# Patient Record
Sex: Female | Born: 1935 | Race: White | Hispanic: No | Smoking: Former smoker
Health system: Southern US, Community
[De-identification: ages and names within clinical notes are randomized; demographics above are authoritative.]

## PROBLEM LIST (undated history)

## (undated) DIAGNOSIS — I509 Heart failure, unspecified: Secondary | ICD-10-CM

## (undated) DIAGNOSIS — F329 Major depressive disorder, single episode, unspecified: Secondary | ICD-10-CM

## (undated) DIAGNOSIS — I10 Essential (primary) hypertension: Secondary | ICD-10-CM

## (undated) DIAGNOSIS — Z9981 Dependence on supplemental oxygen: Secondary | ICD-10-CM

## (undated) DIAGNOSIS — I219 Acute myocardial infarction, unspecified: Secondary | ICD-10-CM

## (undated) DIAGNOSIS — I639 Cerebral infarction, unspecified: Secondary | ICD-10-CM

## (undated) DIAGNOSIS — F32A Depression, unspecified: Secondary | ICD-10-CM

## (undated) DIAGNOSIS — J449 Chronic obstructive pulmonary disease, unspecified: Secondary | ICD-10-CM

## (undated) DIAGNOSIS — J189 Pneumonia, unspecified organism: Secondary | ICD-10-CM

## (undated) DIAGNOSIS — I714 Abdominal aortic aneurysm, without rupture, unspecified: Secondary | ICD-10-CM

## (undated) DIAGNOSIS — G629 Polyneuropathy, unspecified: Secondary | ICD-10-CM

## (undated) DIAGNOSIS — M7989 Other specified soft tissue disorders: Secondary | ICD-10-CM

## (undated) DIAGNOSIS — F419 Anxiety disorder, unspecified: Secondary | ICD-10-CM

## (undated) DIAGNOSIS — M1990 Unspecified osteoarthritis, unspecified site: Secondary | ICD-10-CM

## (undated) HISTORY — DX: Acute myocardial infarction, unspecified: I21.9

## (undated) HISTORY — PX: APPENDECTOMY: SHX54

## (undated) HISTORY — DX: Dependence on supplemental oxygen: Z99.81

## (undated) HISTORY — DX: Abdominal aortic aneurysm, without rupture: I71.4

## (undated) HISTORY — DX: Polyneuropathy, unspecified: G62.9

## (undated) HISTORY — DX: Other specified soft tissue disorders: M79.89

## (undated) HISTORY — PX: CHOLECYSTECTOMY: SHX55

## (undated) HISTORY — DX: Abdominal aortic aneurysm, without rupture, unspecified: I71.40

---

## 1998-10-12 ENCOUNTER — Emergency Department (HOSPITAL_COMMUNITY): Admission: EM | Admit: 1998-10-12 | Discharge: 1998-10-12 | Payer: Self-pay | Admitting: *Deleted

## 2006-09-17 ENCOUNTER — Emergency Department (HOSPITAL_COMMUNITY): Admission: EM | Admit: 2006-09-17 | Discharge: 2006-09-17 | Payer: Self-pay | Admitting: Emergency Medicine

## 2006-10-30 ENCOUNTER — Ambulatory Visit: Payer: Self-pay | Admitting: Internal Medicine

## 2006-10-30 LAB — CONVERTED CEMR LAB: TSH: 1.259 microintl units/mL

## 2006-11-28 ENCOUNTER — Encounter: Payer: Self-pay | Admitting: Internal Medicine

## 2006-11-28 DIAGNOSIS — M545 Low back pain, unspecified: Secondary | ICD-10-CM | POA: Insufficient documentation

## 2006-11-28 DIAGNOSIS — N390 Urinary tract infection, site not specified: Secondary | ICD-10-CM | POA: Insufficient documentation

## 2006-11-28 DIAGNOSIS — R32 Unspecified urinary incontinence: Secondary | ICD-10-CM | POA: Insufficient documentation

## 2006-11-28 DIAGNOSIS — I252 Old myocardial infarction: Secondary | ICD-10-CM

## 2006-11-28 DIAGNOSIS — H269 Unspecified cataract: Secondary | ICD-10-CM | POA: Insufficient documentation

## 2006-11-28 DIAGNOSIS — J4489 Other specified chronic obstructive pulmonary disease: Secondary | ICD-10-CM | POA: Insufficient documentation

## 2006-11-28 DIAGNOSIS — F329 Major depressive disorder, single episode, unspecified: Secondary | ICD-10-CM

## 2006-11-28 DIAGNOSIS — I10 Essential (primary) hypertension: Secondary | ICD-10-CM

## 2006-11-28 DIAGNOSIS — N318 Other neuromuscular dysfunction of bladder: Secondary | ICD-10-CM | POA: Insufficient documentation

## 2006-11-28 DIAGNOSIS — J449 Chronic obstructive pulmonary disease, unspecified: Secondary | ICD-10-CM

## 2006-11-28 DIAGNOSIS — F419 Anxiety disorder, unspecified: Secondary | ICD-10-CM

## 2007-01-23 ENCOUNTER — Ambulatory Visit: Payer: Self-pay | Admitting: Internal Medicine

## 2007-01-24 ENCOUNTER — Ambulatory Visit (HOSPITAL_COMMUNITY): Admission: RE | Admit: 2007-01-24 | Discharge: 2007-01-24 | Payer: Self-pay | Admitting: Internal Medicine

## 2007-01-30 ENCOUNTER — Ambulatory Visit (HOSPITAL_COMMUNITY): Admission: RE | Admit: 2007-01-30 | Discharge: 2007-01-30 | Payer: Self-pay | Admitting: Internal Medicine

## 2007-02-03 ENCOUNTER — Ambulatory Visit: Payer: Self-pay | Admitting: Internal Medicine

## 2007-02-22 ENCOUNTER — Emergency Department (HOSPITAL_COMMUNITY): Admission: EM | Admit: 2007-02-22 | Discharge: 2007-02-23 | Payer: Self-pay | Admitting: Emergency Medicine

## 2007-04-25 ENCOUNTER — Ambulatory Visit: Payer: Self-pay | Admitting: Family Medicine

## 2007-05-20 ENCOUNTER — Ambulatory Visit: Payer: Self-pay | Admitting: Family Medicine

## 2007-09-24 ENCOUNTER — Encounter: Payer: Self-pay | Admitting: *Deleted

## 2007-09-24 ENCOUNTER — Ambulatory Visit: Payer: Self-pay | Admitting: Orthopedic Surgery

## 2007-09-24 DIAGNOSIS — M5430 Sciatica, unspecified side: Secondary | ICD-10-CM

## 2007-09-24 DIAGNOSIS — M549 Dorsalgia, unspecified: Secondary | ICD-10-CM | POA: Insufficient documentation

## 2007-09-29 ENCOUNTER — Telehealth (INDEPENDENT_AMBULATORY_CARE_PROVIDER_SITE_OTHER): Payer: Self-pay | Admitting: *Deleted

## 2007-09-29 ENCOUNTER — Ambulatory Visit: Payer: Self-pay | Admitting: Internal Medicine

## 2007-09-29 DIAGNOSIS — I714 Abdominal aortic aneurysm, without rupture: Secondary | ICD-10-CM

## 2007-09-29 DIAGNOSIS — F17200 Nicotine dependence, unspecified, uncomplicated: Secondary | ICD-10-CM

## 2007-09-29 LAB — CONVERTED CEMR LAB
CO2: 28 meq/L (ref 19–32)
Calcium: 9.9 mg/dL (ref 8.4–10.5)
Chloride: 103 meq/L (ref 96–112)
Glucose, Bld: 98 mg/dL (ref 70–99)
Potassium: 4.4 meq/L (ref 3.5–5.3)
Sodium: 142 meq/L (ref 135–145)

## 2007-09-30 ENCOUNTER — Telehealth (INDEPENDENT_AMBULATORY_CARE_PROVIDER_SITE_OTHER): Payer: Self-pay | Admitting: *Deleted

## 2007-10-01 ENCOUNTER — Telehealth (INDEPENDENT_AMBULATORY_CARE_PROVIDER_SITE_OTHER): Payer: Self-pay | Admitting: *Deleted

## 2007-10-02 ENCOUNTER — Ambulatory Visit (HOSPITAL_COMMUNITY): Admission: RE | Admit: 2007-10-02 | Discharge: 2007-10-02 | Payer: Self-pay | Admitting: Internal Medicine

## 2007-10-06 ENCOUNTER — Telehealth (INDEPENDENT_AMBULATORY_CARE_PROVIDER_SITE_OTHER): Payer: Self-pay | Admitting: *Deleted

## 2007-10-09 ENCOUNTER — Ambulatory Visit: Payer: Self-pay | Admitting: Orthopedic Surgery

## 2007-10-09 DIAGNOSIS — M25569 Pain in unspecified knee: Secondary | ICD-10-CM

## 2007-10-14 ENCOUNTER — Ambulatory Visit: Payer: Self-pay | Admitting: Vascular Surgery

## 2007-11-07 ENCOUNTER — Ambulatory Visit: Payer: Self-pay | Admitting: Internal Medicine

## 2007-11-07 DIAGNOSIS — J441 Chronic obstructive pulmonary disease with (acute) exacerbation: Secondary | ICD-10-CM | POA: Insufficient documentation

## 2007-11-07 DIAGNOSIS — R635 Abnormal weight gain: Secondary | ICD-10-CM | POA: Insufficient documentation

## 2007-11-26 ENCOUNTER — Emergency Department (HOSPITAL_COMMUNITY): Admission: EM | Admit: 2007-11-26 | Discharge: 2007-11-26 | Payer: Self-pay | Admitting: Emergency Medicine

## 2007-12-05 ENCOUNTER — Ambulatory Visit: Payer: Self-pay | Admitting: Internal Medicine

## 2007-12-05 DIAGNOSIS — J309 Allergic rhinitis, unspecified: Secondary | ICD-10-CM | POA: Insufficient documentation

## 2007-12-19 ENCOUNTER — Telehealth (INDEPENDENT_AMBULATORY_CARE_PROVIDER_SITE_OTHER): Payer: Self-pay | Admitting: Internal Medicine

## 2008-01-05 ENCOUNTER — Ambulatory Visit: Payer: Self-pay | Admitting: Internal Medicine

## 2008-01-06 ENCOUNTER — Telehealth (INDEPENDENT_AMBULATORY_CARE_PROVIDER_SITE_OTHER): Payer: Self-pay | Admitting: *Deleted

## 2008-01-06 LAB — CONVERTED CEMR LAB
CO2: 25 meq/L (ref 19–32)
Calcium: 11 mg/dL — ABNORMAL HIGH (ref 8.4–10.5)
Chloride: 96 meq/L (ref 96–112)
Creatinine, Ser: 0.87 mg/dL (ref 0.40–1.20)
Glucose, Bld: 93 mg/dL (ref 70–99)
HDL: 85 mg/dL (ref >39)
LDL Cholesterol: 125 mg/dL — ABNORMAL HIGH (ref 0–99)
TSH: 1.029 microintl units/mL (ref 0.350–5.50)

## 2008-03-09 ENCOUNTER — Encounter: Admission: RE | Admit: 2008-03-09 | Discharge: 2008-03-09 | Payer: Self-pay | Admitting: Vascular Surgery

## 2008-03-09 ENCOUNTER — Ambulatory Visit: Payer: Self-pay | Admitting: Vascular Surgery

## 2008-03-24 ENCOUNTER — Telehealth: Payer: Self-pay | Admitting: Orthopedic Surgery

## 2008-06-14 ENCOUNTER — Encounter (INDEPENDENT_AMBULATORY_CARE_PROVIDER_SITE_OTHER): Payer: Self-pay | Admitting: Internal Medicine

## 2008-06-16 ENCOUNTER — Ambulatory Visit: Payer: Self-pay | Admitting: Internal Medicine

## 2008-06-17 LAB — CONVERTED CEMR LAB
Calcium: 9.8 mg/dL (ref 8.4–10.5)
Chloride: 100 meq/L (ref 96–112)
Creatinine, Ser: 0.79 mg/dL (ref 0.40–1.20)
Sodium: 134 meq/L — ABNORMAL LOW (ref 135–145)

## 2008-07-22 ENCOUNTER — Encounter (INDEPENDENT_AMBULATORY_CARE_PROVIDER_SITE_OTHER): Payer: Self-pay | Admitting: Internal Medicine

## 2008-09-14 ENCOUNTER — Ambulatory Visit: Payer: Self-pay | Admitting: Vascular Surgery

## 2008-09-14 ENCOUNTER — Encounter: Admission: RE | Admit: 2008-09-14 | Discharge: 2008-09-14 | Payer: Self-pay | Admitting: Vascular Surgery

## 2009-02-02 ENCOUNTER — Encounter (INDEPENDENT_AMBULATORY_CARE_PROVIDER_SITE_OTHER): Payer: Self-pay | Admitting: Internal Medicine

## 2009-05-03 ENCOUNTER — Ambulatory Visit: Payer: Self-pay | Admitting: Vascular Surgery

## 2009-05-03 ENCOUNTER — Encounter: Admission: RE | Admit: 2009-05-03 | Discharge: 2009-05-03 | Payer: Self-pay | Admitting: Vascular Surgery

## 2010-01-12 ENCOUNTER — Encounter: Admission: RE | Admit: 2010-01-12 | Discharge: 2010-01-12 | Payer: Self-pay | Admitting: Vascular Surgery

## 2010-01-12 ENCOUNTER — Ambulatory Visit: Payer: Self-pay | Admitting: Vascular Surgery

## 2011-01-08 ENCOUNTER — Inpatient Hospital Stay (HOSPITAL_COMMUNITY): Admission: EM | Admit: 2011-01-08 | Discharge: 2011-01-11 | Payer: Self-pay | Source: Home / Self Care

## 2011-01-15 LAB — CBC
HCT: 39.5 % (ref 36.0–46.0)
HCT: 40.5 % (ref 36.0–46.0)
Hemoglobin: 14.2 g/dL (ref 12.0–15.0)
Hemoglobin: 14.1 g/dL (ref 12.0–15.0)
MCH: 33.5 pg (ref 26.0–34.0)
MCH: 32.9 pg (ref 26.0–34.0)
MCHC: 35.9 g/dL (ref 30.0–36.0)
MCHC: 34.8 g/dL (ref 30.0–36.0)
MCV: 93.2 fL (ref 78.0–100.0)
MCV: 94.4 fL (ref 78.0–100.0)
Platelets: 199 10*3/uL (ref 150–400)
Platelets: 216 10*3/uL (ref 150–400)
RBC: 4.24 MIL/uL (ref 3.87–5.11)
RBC: 4.29 MIL/uL (ref 3.87–5.11)
RDW: 13.3 % (ref 11.5–15.5)
RDW: 13.4 % (ref 11.5–15.5)
WBC: 7.9 10*3/uL (ref 4.0–10.5)
WBC: 6.1 10*3/uL (ref 4.0–10.5)

## 2011-01-15 LAB — DIFFERENTIAL
Basophils Absolute: 0 10*3/uL (ref 0.0–0.1)
Basophils Relative: 0 % (ref 0–1)
Eosinophils Absolute: 0.1 10*3/uL (ref 0.0–0.7)
Eosinophils Relative: 1 % (ref 0–5)
Lymphocytes Relative: 28 % (ref 12–46)
Lymphs Abs: 2.3 10*3/uL (ref 0.7–4.0)
Monocytes Absolute: 1 10*3/uL (ref 0.1–1.0)
Monocytes Relative: 12 % (ref 3–12)
Neutro Abs: 4.7 10*3/uL (ref 1.7–7.7)
Neutrophils Relative %: 59 % (ref 43–77)

## 2011-01-15 LAB — BRAIN NATRIURETIC PEPTIDE: Pro B Natriuretic peptide (BNP): <30 pg/mL (ref 0.0–100.0)

## 2011-01-15 LAB — BASIC METABOLIC PANEL
BUN: 12 mg/dL (ref 6–23)
CO2: 30 mEq/L (ref 19–32)
Calcium: 9.4 mg/dL (ref 8.4–10.5)
Chloride: 105 mEq/L (ref 96–112)
Creatinine, Ser: 1 mg/dL (ref 0.4–1.2)
GFR calc Af Amer: >60 mL/min (ref >60)
GFR calc non Af Amer: 53 mL/min — ABNORMAL LOW (ref >60)
Glucose, Bld: 76 mg/dL (ref 70–99)
Potassium: 4.2 mEq/L (ref 3.5–5.1)
Sodium: 138 mEq/L (ref 135–145)

## 2011-01-15 LAB — COMPREHENSIVE METABOLIC PANEL
ALT: 14 U/L (ref 0–35)
AST: 17 U/L (ref 0–37)
Albumin: 3.2 g/dL — ABNORMAL LOW (ref 3.5–5.2)
Alkaline Phosphatase: 73 U/L (ref 39–117)
BUN: 15 mg/dL (ref 6–23)
CO2: 29 mEq/L (ref 19–32)
Calcium: 9.3 mg/dL (ref 8.4–10.5)
Chloride: 100 mEq/L (ref 96–112)
Creatinine, Ser: 1.02 mg/dL (ref 0.4–1.2)
GFR calc Af Amer: >60 mL/min (ref >60)
GFR calc non Af Amer: 52 mL/min — ABNORMAL LOW (ref >60)
Glucose, Bld: 93 mg/dL (ref 70–99)
Potassium: 4.1 mEq/L (ref 3.5–5.1)
Sodium: 135 mEq/L (ref 135–145)
Total Bilirubin: 0.6 mg/dL (ref 0.3–1.2)
Total Protein: 6.6 g/dL (ref 6.0–8.3)

## 2011-01-15 LAB — URINE CULTURE
Colony Count: >=100000
Culture  Setup Time: 201201101110

## 2011-01-15 LAB — STREP PNEUMONIAE URINARY ANTIGEN: Strep Pneumo Urinary Antigen: NEGATIVE

## 2011-01-15 LAB — CULTURE, BLOOD (ROUTINE X 2)
Culture: NO GROWTH
Culture: NO GROWTH

## 2011-01-15 LAB — LEGIONELLA ANTIGEN, URINE: Legionella Antigen, Urine: NEGATIVE

## 2011-01-15 LAB — URINALYSIS, ROUTINE W REFLEX MICROSCOPIC
Bilirubin Urine: NEGATIVE
Hgb urine dipstick: NEGATIVE
Ketones, ur: NEGATIVE mg/dL
Nitrite: NEGATIVE
Protein, ur: NEGATIVE mg/dL
Specific Gravity, Urine: 1.01 (ref 1.005–1.030)
Urine Glucose, Fasting: NEGATIVE mg/dL
Urobilinogen, UA: 1 mg/dL (ref 0.0–1.0)
pH: 6.5 (ref 5.0–8.0)

## 2011-01-15 LAB — HEMOGLOBIN A1C
Hgb A1c MFr Bld: 6.2 % — ABNORMAL HIGH (ref <5.7)
Mean Plasma Glucose: 131 mg/dL — ABNORMAL HIGH (ref <117)

## 2011-01-15 LAB — MRSA PCR SCREENING: MRSA by PCR: NEGATIVE

## 2011-01-15 LAB — TSH: TSH: 0.914 u[IU]/mL (ref 0.350–4.500)

## 2011-01-15 LAB — URINE MICROSCOPIC-ADD ON

## 2011-01-21 ENCOUNTER — Encounter: Payer: Self-pay | Admitting: Vascular Surgery

## 2011-01-29 ENCOUNTER — Ambulatory Visit (HOSPITAL_COMMUNITY): Admission: RE | Admit: 2011-01-29 | Payer: Self-pay | Source: Home / Self Care | Admitting: Pulmonary Disease

## 2011-01-31 ENCOUNTER — Encounter (HOSPITAL_COMMUNITY): Payer: Self-pay

## 2011-02-05 ENCOUNTER — Ambulatory Visit (HOSPITAL_COMMUNITY)
Admission: RE | Admit: 2011-02-05 | Discharge: 2011-02-05 | Disposition: A | Discharge status: Home / Self Care | Payer: Medicare PPO | Source: Ambulatory Visit | Attending: Pulmonary Disease | Admitting: Pulmonary Disease

## 2011-02-05 DIAGNOSIS — J449 Chronic obstructive pulmonary disease, unspecified: Secondary | ICD-10-CM | POA: Insufficient documentation

## 2011-02-05 DIAGNOSIS — R0602 Shortness of breath: Secondary | ICD-10-CM | POA: Insufficient documentation

## 2011-02-05 DIAGNOSIS — J4489 Other specified chronic obstructive pulmonary disease: Secondary | ICD-10-CM | POA: Insufficient documentation

## 2011-02-05 LAB — BLOOD GAS, ARTERIAL
Bicarbonate: 28.1 mEq/L — ABNORMAL HIGH (ref 20.0–24.0)
Patient temperature: 37
pCO2 arterial: 43.3 mmHg (ref 35.0–45.0)
pH, Arterial: 7.428 — ABNORMAL HIGH (ref 7.350–7.400)

## 2011-05-15 NOTE — Assessment & Plan Note (Signed)
OFFICE VISIT   Lowe, Melanie S  DOB:  [birth year]                                       03/09/2008  CHART#:[id number]   I saw the patient in the office today for continued followup of her  abdominal aortic aneurysm.  I had originally seen her in consultation on  10/14/2007 with a 4.6 cm infrarenal abdominal aortic aneurysm by  ultrasound.  I explained that we generally would not consider elective  repair unless it reached 5.5 cm in maximum diameter and recommended  followup CT scan in 6 months.  She returns for her followup scan.  Since  I saw her last, she has had no acute onset of abdominal or back pain.  She does have some chronic low back pain.  There has been no significant  changes in her medical history since October 2008.   SOCIAL HISTORY:  She does continue to smoke.  She has cut back to a pack  per day, however.  She had been smoking 1-1/2 packs per day for as long  as she can remember.   REVIEW OF SYSTEMS:  She admits to significant dyspnea on exertion.  She  has occasional episodes of bronchitis.  She has had no recent chest  pain, chest pressure, palpitations, or arrhythmias.  She has had no  significant leg pain.   PHYSICAL EXAMINATION:  This is a pleasant 77 year old woman who appears  her stated age.  Blood pressure is 123/78, heart rate is 98.  I do not  detect any carotid bruits.  Lungs are clear bilaterally to auscultation.  On cardiac exam, she has a regular rate and rhythm.  Her abdomen is soft  and nontender.  Her aneurysm is palpable and nontender.  She has normal-  pitched bowel sounds.  She has palpable femoral pulses and warm, well-  perfused feet without ishemic ulcers.  Neurologic exam is nonfocal.   I have reviewed her CAT scan, and in fact, she does have a suprarenal  abdominal aortic aneurysm.  The aorta at the level of the celiac access  measures 35 to 38 mm.  Her renal arteries appear to originate from the  aneurysm itself.   Fortunately, the aneurysm has not enlarged  significantly and the maximum diameter is 4.8 cm.   Unfortunately, this patient has a suprarenal aneurysm, which would  likely require a thoracoabdominal approach if it enlarged enough to  consider repair.  Given her history of COPD, this would be extremely  high risk surgery with significant risk of the need for prolonged  ventilatory support, MI, renal failure, paraplegia, or mortality.  Fortunately, the aneurysm has not enlarged significantly and is at 4.8  cm.  I have discussed with her the importance of tobacco cessation, and  explained that poorly controlled blood pressure and continued tobacco  use are the 2 factors associated with continued aneurysm enlargement.  I  plan on seeing her back in 6 months with a followup CT scan.  If the  aneurysm continues to enlarge significantly, we would have to consider  thoracoabdominal repair, and I would have Dr. Madilyn Fireman see her for this.  However, given her age and emphysema, she would be at extremely high  risk for a thoracoabdominal approach.  I will see her back in 6 months.  She knows to call sooner if she has problems.  Di Kindle. Edilia Bo, M.D.  Electronically Signed   CSD/MEDQ  D:  03/09/2008  T:  03/10/2008  Job:  [id number]   cc:   Dr. Florencia Reasons

## 2011-05-15 NOTE — Assessment & Plan Note (Signed)
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  I last saw her in September of 2009.  She  comes in for a 6 month followup study.  When I saw her last in September  there had been no change in the size of her juxtarenal component of her  aneurysm which measured 4.8 cm in maximum diameter.  The ascending aorta  was 4.5 cm in diameter and not changed.   Since I saw her last she continues to have chronic low back pain which  has not changed in character.  She has had no abdominal pain.  She  continues to try to cut back on her smoking and has cut back to 4-5  cigarettes per day.  She had smoked up to 1-1/2 packs per day of  cigarettes.  Her only complaint has been her back pain and some left  knee pain.   REVIEW OF SYSTEMS:  On review of systems she has had no chest pain,  chest pressure, palpitations or arrhythmias.  She has had no productive  cough, bronchitis, asthma or wheezing.   PHYSICAL EXAMINATION:  On physical examination this is a pleasant 77-  year-old woman who appears her stated age.  Her blood pressure is  124/88, heart rate is 101.  I do not detect any carotid bruits.  Lungs  are clear bilaterally to auscultation.  On cardiac exam she has a  regular rate and rhythm.  Her abdomen is soft and nontender.  Her  aneurysm is palpable and nontender.  She has palpable femoral pulses and  warm, well-perfused feet without ischemic ulcers.   I reviewed her CT scan.  The ascending aorta measures 4.5 cm in maximum  diameter which has not changed.  I am still measuring the maximum  diameter of the pararenal component of her aneurysm at 4.8 cm.  As per  my previous notes I think repair of this aneurysm would require a  thoracoabdominal approach as there is really no normal segment of aorta  except the distal descending thoracic aorta.  Given her age and  significant COPD she would be at very high risk for surgery and for this  reason we were both agreeable that we will continue to simply follow  this and only consider thoracoabdominal repair if this were to enlarge  significantly.  I plan on seeing her back in 6 months with a followup it  is CT angio of the abdomen and pelvis.  I will repeat her chest CT in 1  year from now.   Di Kindle. Edilia Bo, M.D.  Electronically Signed   CSD/MEDQ  D:  05/03/2009  T:  05/04/2009  Job:  [id number]

## 2011-05-15 NOTE — Assessment & Plan Note (Signed)
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  This is a pleasant 77 year old woman who I  have been following since October of 2008 with a thoracoabdominal  aneurysm.  Back then it was 4.6 cm in maximum diameter.  I last saw her  in May of 2010.  She comes in for a 6 month followup visit.  She does  have chronic low back pain but has had no significant change in the  character of her pain and has had no significant abdominal pain.   PAST MEDICAL HISTORY:  Is otherwise significant for hypertension,  although currently she has not required as much blood pressure  medication and this appears to be improving.  In addition, I suspect she  has COPD given her long smoking history.  She denies any history of  previous myocardial infarction, history of congestive heart failure or  history of diabetes.   SOCIAL HISTORY:  She is widowed.  She has two children.  She smokes half  a pack per day of cigarettes and has been smoking for as long as she can  remember.   REVIEW OF SYSTEMS:  CARDIOVASCULAR:  She has had no chest pain, chest  pressure, palpitations or arrhythmias.  She does admit to dyspnea on  exertion.  She has had some bilateral lower extremity claudication but  no rest pain or nonhealing ulcers.  She has had no history of stroke,  TIAs or amaurosis fugax.  She has had no history of DVT or phlebitis.  PULMONARY:  She is not on home O2.  She has had no recent productive  cough, bronchitis, asthma or wheezing.   PHYSICAL EXAMINATION:  General:  This is a pleasant 77 year old woman  who appears her stated age.  Vital signs:  Temperature is 98.  Blood  pressure 139/95, heart rate is 105.  Lungs:  Are clear bilaterally to  auscultation with some mild expiratory wheezes.  Cardiovascular:  I do  not detect any carotid bruits.  She has a regular rate and rhythm  without murmur appreciated.  She has mild peripheral edema.  She has  palpable femoral pulses.  I cannot palpate popliteal or pedal pulses.  Abdomen:  Soft and nontender with no masses appreciated.  Her aneurysm  is palpable and nontender.  Neurological:  Nonfocal with no focal  weakness or paresthesias.   I have reviewed her CT scan and the size of her aneurysm at the  pararenal area is approximately 4.8 cm which has not changed  significantly in size.  This clearly would require a thoracoabdominal  repair.  The superior mesenteric artery originates lower than the left  renal artery and then the right renal artery comes off the aneurysm  itself.  Fortunately there has been no significant change in the size of  the aneurysm and I think she would be at very high risk for  thoracoabdominal repair given her age and smoking history.  We have  discussed stretching her followup out to 1 year and she was agreeable  with this.  I plan on seeing her back in 1 year with a CT scan of the  chest, abdomen and pelvis.  I have explained that if the aneurysm did  become symptomatic or enlarged significantly and she were being  considered for a thoracoabdominal repair that we have been referring our  patients to Dr. Pattricia Boss in [facility] for this and we have discussed  this in the office today.     Di Kindle. Edilia Bo, M.D.  Electronically Signed   CSD/MEDQ  D:  01/12/2010  T:  01/13/2010  Job:  [id number]

## 2011-05-15 NOTE — Assessment & Plan Note (Signed)
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  I last saw her in March 2009.  She is 77  years old and has a history of significant COPD.  When I saw her last,  the juxtarenal component of her aneurysm was 4.8 cm in maximum diameter.  She comes in for a 66-month followup study.  Her CT scan today shows that  has been no change in the size of the juxtarenal component which  measures approximately 4.8 cm in maximum diameter.  The ascending aorta  has a maximum diameter of 4.5 cm.  She has had no significant abdominal  pain.  She does have a history of chronic low back pain.   REVIEW OF SYSTEMS:  CARDIOVASCULAR:  She had no chest pain, chest  pressure, palpitations or arrhythmias.  RESPIRATORY:  She had no recent bronchitis, although she had bronchitis  in the past.   SOCIAL HISTORY:  She does continue to smoke about 6 cigarettes a day.   PHYSICAL EXAMINATION:  This is a pleasant 77 year old woman who appears  her stated age.  Blood pressure 109/75, heart rate is 107.  Neck is  supple.  I do not detect any carotid bruits.  Lungs are clear  bilaterally to auscultation.  Cardiac exam, she has a regular rate and  rhythm.  Her abdomen soft and nontender.  Aneurysm is palpable and  nontender.  She has normal-pitched bowel sounds.  She has palpable  femoral pulses and warm, well-perfused feet bilaterally with no  significant lower extremity swelling.   I have reassured her that there has been no change in the size of her  aneurysm.  I have recommend a followup CT scan in 6 months.  If the  aneurysm were to enlarge significantly, she may have to be considered  for thoracoabdominal repair with very high risk given her age and  pulmonary status.  We discussed this previously.  Hopefully, the  aneurysm  will remain stable in size.  I will see her 6 months.   Di Kindle. Edilia Bo, M.D.  Electronically Signed   CSD/MEDQ  D:  09/14/2008  T:  09/16/2008  Job:  [id number]

## 2011-05-15 NOTE — Assessment & Plan Note (Signed)
OFFICE VISIT   Lowe, Melanie S  DOB:  [birth year]                                       03/09/2008  CHART#:[id number]   I saw the patient in the office today for continued followup of her  abdominal aortic aneurysm.  I originally had seen her in consultation on  10/14/07 with a 4.6 cm infrarenal abdominal aortic aneurysm by  ultrasound.  I explained that we generally would not consider elective  repair unless it reached 5.5 cm in maximum diameter, and I recommended a  follow-up CT scan in six months.  She returns for her follow-up scan.  Since I saw her last, she has had no acute onset of abdominal or back  pain.  She does have some chronic low back pain.  There has been no  significant change in her medical history since 10/08.   SOCIAL HISTORY:  She does continue to smoke.  She has cut back to a pack  per day.  However, she had been smoking 1-1/2 packs per day for as long  as she can remember.   REVIEW OF SYSTEMS:  She admits to significant dyspnea on exertion.  She  has occasional episodes of bronchitis.  She has had no recent chest  pain, chest pressure, palpitations, or arrhythmias.  She has had no  significant leg pain.   PHYSICAL EXAMINATION:  This is a pleasant 77 year old woman who appears  her stated age.  Blood pressure is 123/78.  Heart rate is 98.  I do not  detect any carotid bruits.  Lungs are clear bilaterally to auscultation.  On cardiac exam, she has irregular rate and rhythm.  Her abdomen is soft  and nontender.  Her aneurysm is palpable and nontender.  She has normal-  pitched bowel sounds.  She has palpable femoral pulses with warm, well-  perfused feet without ischemic ulcers.  Neurologic exam is nonfocal.   I have reviewed her CT scan.  In fact, she does have a suprarenal  abdominal aortic aneurysm.  The aorta at the level of the celiac access  measures 35-38 mm.  Her renal arteries appear to originate from the  aneurysm itself.   Fortunately, the aneurysm has not enlarged  significantly, and the maximum diameter is 4.8 cm.  Unfortunately, this  patient has a suprarenal aneurysm that would require a thoracoabdominal  approach if it enlarges enough to consider repair.  Given her history of  COPD, this would be extremely high-risk surgery with significant risk of  the need for prolonged ventilatory support, MI, renal failure,  paraplegia, or mortality.  Fortunately, the aneurysm is not enlarged  significantly and is at 4.8 cm.  I have discussed with her the  importance of tobacco cessation and explained that poorly controlled  blood pressure and continued tobacco use are the two factors associated  with continued aneurysm enlargement.  I plan on seeing her back in six  months with a follow-up CT scan.  If the aneurysm continues to enlarged  significantly, we would have to consider thoracoabdominal repair, and I  would have Dr. Madilyn Fireman see her for this; however, given her age and  emphysema, she would be extremely high risk for a  thoracoabdominal  approach.  I will see her back in six months.  She knows to call sooner  if she has problems.  Di Kindle. Edilia Bo, M.D.  Electronically Signed   CSD/MEDQ  D:  03/16/2008  T:  03/17/2008  Job:  [id number]   cc:   Erle Crocker, M.D.

## 2011-05-15 NOTE — Consult Note (Signed)
VASCULAR SURGERY CONSULTATION   Lowe, Melanie S  DOB:  1924-12-17                                       10/14/2007  CHART#:[id number]   I saw the patient in the office today in consultation concerning a 4.6  cm infrarenal abdominal aortic aneurysm.  She was referred by Dr. Jen Mow.  This is a pleasant 77 year old woman who was found to have an aneurysm  by ultrasound approximately six months ago in March when it measured 4.1  cm in maximum diameter.  On her most recent follow-up  study, this  enlarged to 4.6 cm, and she was sent for a vascular consultation.  Of  note, she denies any history of abdominal pain.  She has had some  chronic low back pain and has a history of lumbar disk disease.   PAST MEDICAL HISTORY:  Significant for hypertension.  She denies any  history of diabetes, history of hypercholesterolemia, history of  previous myocardial infarction, or history of congestive heart failure.  She does have a history of COPD.   She is unaware of her family history, as her father died when she was  only 50 days old, and she is not aware of her mother's history.  She has  not heard that she has any family history of aneurysmal disease.   SOCIAL HISTORY:  She is widowed. She has two children.  She smokes 1-1/2  packs of cigarettes per day and has been smoking for as long as she can  remember.   She has no known drug allergies.   MEDICATIONS:  1. Citalopram 20 mg p.o. nightly.  2. Lisinopril/hydrochlorothiazide 20 mg/12.5 mg 1 p.o. daily.  3. Gabapentin 100 mg 2 capsules nightly.   REVIEW OF SYSTEMS:  GENERAL:  She has had no weight loss, weight gain,  or problems with her appetite.  CARDIAC:  She has had no chest pain, chest pressure, or palpitations.  She does admit to dyspnea on exertion.  PULMONARY:  She has had an occasional productive cough. She has had no  bronchitis, asthma, or wheezing.  VASCULAR:  She has had no claudication, rest pain, or nonhealing  ulcers.  She has had no history of stroke, TIAs, or amaurosis fugax.  NEURO:  She has had some occasional dizziness but no blackouts,  headaches, or seizures.  PSYCHIATRIC:  She has had problems with nervousness and depression since  she lost her husband recently.  GI, GU, ENT, AND HEMATOLOGIC:  Unremarkable.   PHYSICAL EXAMINATION:  Blood pressure is 127/82, heart rate is 88.  I do  not detect any carotid bruits.  Lungs are clear bilaterally to  auscultation.  Cardiac:  She has a regular rate and rhythm.  Abdomen is  soft and nontender.  It is somewhat difficult to palpate her aneurysm,  which is nontender.  She has palpable femoral, popliteal, dorsalis pedis  and posterior tibial pulses bilaterally.  Extremities:  She has no  significant lower extremity swelling.  She does have significant  varicose veins bilaterally.  She has no evidence of atheroembolic  disease.   I have reviewed her ultrasound, which shows the maximum diameter of her  aneurysm is 4.6 cm.   I have explained that generally we would not consider elective repair of  the aneurysm unless it reached 5.5 cm in maximum diameter.  I have  recommended  a follow-up CT scan in four months, as the daughter was very  concerned about her aneurysm and was reluctant to wait six months, and I  do not think this is unreasonable.  I have discussed with her the  importance of tobacco cessation, as this and poorly controlled blood  pressure are the two factors which have been associated with increased  risk of aneurysm and enlargement.  I will see her back in four months.  She knows to call us sooner if she has problems.   Melanie Lowe. Edilia Bo, M.D.  Electronically Signed  CSD/MEDQ  D:  10/14/2007  T:  10/15/2007  Job:  [id number]   cc:   Erle Crocker, M.D.

## 2011-07-10 ENCOUNTER — Other Ambulatory Visit (HOSPITAL_COMMUNITY): Payer: Self-pay | Admitting: Pulmonary Disease

## 2011-07-10 DIAGNOSIS — I714 Abdominal aortic aneurysm, without rupture: Secondary | ICD-10-CM

## 2011-07-13 ENCOUNTER — Ambulatory Visit (HOSPITAL_COMMUNITY)
Admission: RE | Admit: 2011-07-13 | Discharge: 2011-07-13 | Disposition: A | Discharge status: Home / Self Care | Payer: Medicare HMO | Source: Ambulatory Visit | Attending: Pulmonary Disease | Admitting: Pulmonary Disease

## 2011-07-13 DIAGNOSIS — I714 Abdominal aortic aneurysm, without rupture, unspecified: Secondary | ICD-10-CM | POA: Insufficient documentation

## 2011-07-31 ENCOUNTER — Emergency Department (HOSPITAL_COMMUNITY): Payer: Medicare HMO

## 2011-07-31 ENCOUNTER — Other Ambulatory Visit: Payer: Self-pay

## 2011-07-31 ENCOUNTER — Observation Stay (HOSPITAL_COMMUNITY)
Admission: EM | Admit: 2011-07-31 | Discharge: 2011-08-04 | Disposition: A | Discharge status: Home / Self Care | Payer: Medicare HMO | Attending: Pulmonary Disease | Admitting: Pulmonary Disease

## 2011-07-31 ENCOUNTER — Encounter: Payer: Self-pay | Admitting: *Deleted

## 2011-07-31 DIAGNOSIS — M549 Dorsalgia, unspecified: Secondary | ICD-10-CM

## 2011-07-31 DIAGNOSIS — F329 Major depressive disorder, single episode, unspecified: Secondary | ICD-10-CM

## 2011-07-31 DIAGNOSIS — J4489 Other specified chronic obstructive pulmonary disease: Secondary | ICD-10-CM | POA: Insufficient documentation

## 2011-07-31 DIAGNOSIS — R32 Unspecified urinary incontinence: Secondary | ICD-10-CM

## 2011-07-31 DIAGNOSIS — H269 Unspecified cataract: Secondary | ICD-10-CM

## 2011-07-31 DIAGNOSIS — Z79899 Other long term (current) drug therapy: Secondary | ICD-10-CM | POA: Insufficient documentation

## 2011-07-31 DIAGNOSIS — J309 Allergic rhinitis, unspecified: Secondary | ICD-10-CM

## 2011-07-31 DIAGNOSIS — F3289 Other specified depressive episodes: Secondary | ICD-10-CM | POA: Insufficient documentation

## 2011-07-31 DIAGNOSIS — F419 Anxiety disorder, unspecified: Secondary | ICD-10-CM | POA: Insufficient documentation

## 2011-07-31 DIAGNOSIS — R635 Abnormal weight gain: Secondary | ICD-10-CM

## 2011-07-31 DIAGNOSIS — M545 Low back pain: Secondary | ICD-10-CM

## 2011-07-31 DIAGNOSIS — R0789 Other chest pain: Principal | ICD-10-CM | POA: Insufficient documentation

## 2011-07-31 DIAGNOSIS — N390 Urinary tract infection, site not specified: Secondary | ICD-10-CM

## 2011-07-31 DIAGNOSIS — I6529 Occlusion and stenosis of unspecified carotid artery: Secondary | ICD-10-CM | POA: Insufficient documentation

## 2011-07-31 DIAGNOSIS — N318 Other neuromuscular dysfunction of bladder: Secondary | ICD-10-CM

## 2011-07-31 DIAGNOSIS — F411 Generalized anxiety disorder: Secondary | ICD-10-CM

## 2011-07-31 DIAGNOSIS — J449 Chronic obstructive pulmonary disease, unspecified: Secondary | ICD-10-CM

## 2011-07-31 DIAGNOSIS — M5430 Sciatica, unspecified side: Secondary | ICD-10-CM | POA: Insufficient documentation

## 2011-07-31 DIAGNOSIS — M25569 Pain in unspecified knee: Secondary | ICD-10-CM

## 2011-07-31 DIAGNOSIS — M1990 Unspecified osteoarthritis, unspecified site: Secondary | ICD-10-CM | POA: Insufficient documentation

## 2011-07-31 DIAGNOSIS — F17200 Nicotine dependence, unspecified, uncomplicated: Secondary | ICD-10-CM

## 2011-07-31 DIAGNOSIS — I658 Occlusion and stenosis of other precerebral arteries: Secondary | ICD-10-CM | POA: Insufficient documentation

## 2011-07-31 DIAGNOSIS — J441 Chronic obstructive pulmonary disease with (acute) exacerbation: Secondary | ICD-10-CM

## 2011-07-31 DIAGNOSIS — I714 Abdominal aortic aneurysm, without rupture, unspecified: Secondary | ICD-10-CM | POA: Insufficient documentation

## 2011-07-31 DIAGNOSIS — I252 Old myocardial infarction: Secondary | ICD-10-CM | POA: Insufficient documentation

## 2011-07-31 DIAGNOSIS — R079 Chest pain, unspecified: Secondary | ICD-10-CM | POA: Diagnosis present

## 2011-07-31 DIAGNOSIS — F341 Dysthymic disorder: Secondary | ICD-10-CM | POA: Insufficient documentation

## 2011-07-31 DIAGNOSIS — I10 Essential (primary) hypertension: Secondary | ICD-10-CM | POA: Insufficient documentation

## 2011-07-31 HISTORY — DX: Essential (primary) hypertension: I10

## 2011-07-31 HISTORY — DX: Major depressive disorder, single episode, unspecified: F32.9

## 2011-07-31 HISTORY — DX: Depression, unspecified: F32.A

## 2011-07-31 HISTORY — DX: Anxiety disorder, unspecified: F41.9

## 2011-07-31 HISTORY — DX: Chronic obstructive pulmonary disease, unspecified: J44.9

## 2011-07-31 HISTORY — DX: Unspecified osteoarthritis, unspecified site: M19.90

## 2011-07-31 LAB — DIFFERENTIAL
Basophils Relative: 0 % (ref 0–1)
Eosinophils Absolute: 0.1 10*3/uL (ref 0.0–0.7)
Eosinophils Relative: 2 % (ref 0–5)
Monocytes Absolute: 0.5 10*3/uL (ref 0.1–1.0)
Monocytes Relative: 8 % (ref 3–12)

## 2011-07-31 LAB — CBC
HCT: 39.2 % (ref 36.0–46.0)
Hemoglobin: 13.3 g/dL (ref 12.0–15.0)
MCH: 32.4 pg (ref 26.0–34.0)
MCHC: 33.9 g/dL (ref 30.0–36.0)
MCV: 95.6 fL (ref 78.0–100.0)

## 2011-07-31 MED ORDER — PANTOPRAZOLE SODIUM 40 MG IV SOLR
40.0000 mg | Freq: Once | INTRAVENOUS | Status: AC
  Administered 2011-07-31: 40 mg via INTRAVENOUS

## 2011-07-31 MED ORDER — SODIUM CHLORIDE 0.9 % IV SOLN
Freq: Once | INTRAVENOUS | Status: AC
  Administered 2011-07-31: 23:00:00 via INTRAVENOUS

## 2011-07-31 NOTE — ED Notes (Signed)
MD at bedside. 

## 2011-07-31 NOTE — ED Notes (Signed)
Labs drawn from IV site

## 2011-07-31 NOTE — ED Notes (Addendum)
EKG completed in triage prior to room placement

## 2011-07-31 NOTE — ED Notes (Signed)
Pt c/o sharp midsternal chest pain since yesterday.

## 2011-07-31 NOTE — ED Notes (Signed)
Assisted pt to bathroom

## 2011-08-01 ENCOUNTER — Encounter (HOSPITAL_COMMUNITY): Payer: Self-pay

## 2011-08-01 DIAGNOSIS — R079 Chest pain, unspecified: Secondary | ICD-10-CM | POA: Diagnosis present

## 2011-08-01 DIAGNOSIS — I10 Essential (primary) hypertension: Secondary | ICD-10-CM

## 2011-08-01 DIAGNOSIS — I714 Abdominal aortic aneurysm, without rupture: Secondary | ICD-10-CM

## 2011-08-01 LAB — BASIC METABOLIC PANEL
BUN: 18 mg/dL (ref 6–23)
Calcium: 10.3 mg/dL (ref 8.4–10.5)
Chloride: 100 mEq/L (ref 96–112)
Creatinine, Ser: 0.84 mg/dL (ref 0.50–1.10)
GFR calc Af Amer: >60 mL/min (ref >60)
GFR calc non Af Amer: >60 mL/min (ref >60)

## 2011-08-01 LAB — CARDIAC PANEL(CRET KIN+CKTOT+MB+TROPI): Total CK: 99 U/L (ref 7–177)

## 2011-08-01 MED ORDER — ALBUTEROL SULFATE (5 MG/ML) 0.5% IN NEBU: 2.5000 mg | INHALATION_SOLUTION | Freq: Four times a day (QID) | RESPIRATORY_TRACT | Status: DC | PRN

## 2011-08-01 MED ORDER — ASPIRIN 81 MG PO CHEW
81.0000 mg | CHEWABLE_TABLET | Freq: Every day | ORAL | Status: DC
  Administered 2011-08-01 – 2011-08-04 (×4): 81 mg via ORAL
  Filled 2011-08-01 (×3): qty 1

## 2011-08-01 MED ORDER — NITROGLYCERIN 0.4 MG SL SUBL: 0.4000 mg | SUBLINGUAL_TABLET | SUBLINGUAL | Status: DC | PRN

## 2011-08-01 MED ORDER — PANTOPRAZOLE SODIUM 40 MG PO TBEC
40.0000 mg | DELAYED_RELEASE_TABLET | ORAL | Status: DC
  Administered 2011-08-01 – 2011-08-04 (×3): 40 mg via ORAL
  Filled 2011-08-01 (×2): qty 1

## 2011-08-01 MED ORDER — TORSEMIDE 20 MG PO TABS
20.0000 mg | ORAL_TABLET | Freq: Every day | ORAL | Status: DC
  Administered 2011-08-01 – 2011-08-04 (×4): 20 mg via ORAL
  Filled 2011-08-01 (×3): qty 1

## 2011-08-01 MED ORDER — BUDESONIDE-FORMOTEROL FUMARATE 80-4.5 MCG/ACT IN AERO
2.0000 | INHALATION_SPRAY | Freq: Two times a day (BID) | RESPIRATORY_TRACT | Status: DC
  Administered 2011-08-01 – 2011-08-04 (×7): 2 via RESPIRATORY_TRACT
  Filled 2011-08-01: qty 6.9

## 2011-08-01 MED ORDER — CITALOPRAM HYDROBROMIDE 20 MG PO TABS
20.0000 mg | ORAL_TABLET | Freq: Every day | ORAL | Status: DC
  Administered 2011-08-01 – 2011-08-04 (×4): 20 mg via ORAL
  Filled 2011-08-01 (×3): qty 1

## 2011-08-01 MED ORDER — SODIUM CHLORIDE 0.9 % IV SOLN
INTRAVENOUS | Status: DC
  Administered 2011-08-01 – 2011-08-03 (×2): via INTRAVENOUS

## 2011-08-01 MED ORDER — HYDROCODONE-ACETAMINOPHEN 5-325 MG PO TABS: 1.0000 | ORAL_TABLET | Freq: Four times a day (QID) | ORAL | Status: DC | PRN

## 2011-08-01 MED ORDER — CARVEDILOL 3.125 MG PO TABS
3.1250 mg | ORAL_TABLET | Freq: Two times a day (BID) | ORAL | Status: DC
  Administered 2011-08-01 – 2011-08-04 (×5): 3.125 mg via ORAL
  Filled 2011-08-01 (×5): qty 1

## 2011-08-01 MED ORDER — GABAPENTIN 100 MG PO CAPS
100.0000 mg | ORAL_CAPSULE | Freq: Three times a day (TID) | ORAL | Status: DC
  Administered 2011-08-01 – 2011-08-04 (×9): 100 mg via ORAL
  Filled 2011-08-01 (×8): qty 1

## 2011-08-01 MED ORDER — SODIUM CHLORIDE 0.9 % IJ SOLN
INTRAMUSCULAR | Status: AC
  Administered 2011-08-01: 10 mL
  Filled 2011-08-01: qty 10

## 2011-08-01 MED ORDER — ACETAMINOPHEN 500 MG PO TABS: 500.0000 mg | ORAL_TABLET | ORAL | Status: DC | PRN

## 2011-08-01 MED ORDER — TIOTROPIUM BROMIDE MONOHYDRATE 18 MCG IN CAPS
18.0000 ug | ORAL_CAPSULE | Freq: Every day | RESPIRATORY_TRACT | Status: DC
  Administered 2011-08-01 – 2011-08-04 (×4): 18 ug via RESPIRATORY_TRACT
  Filled 2011-08-01: qty 5

## 2011-08-01 NOTE — Consult Note (Signed)
HPI: This is an 77 year old white female patient who presented to the emergency room with burning chest pain in the epigastric region radiating to her back after eating 6 tomatoes. The pain finally he is after belching. She had similar pain 3 days ago after eating tomatoes.  Patient also complains of exertional chest pressure that occurs with little activity. Monday while moving boxes in her house she developed a pressure in her chest that used when she sat down. She states that it radiated from the center of her chest to her left breast and had associated dyspnea and diaphoresis. She denies any dizziness or presyncope. This chest pain has been with exertion since January when she was hospitalized for pneumonia. No Known Allergies   Current facility-administered medications:0.9 %  sodium chloride infusion, , Intravenous, Once, Benny Lennert, MD, Last Rate: 125 mL/hr at 07/31/11 2324;  0.9 %  sodium chloride infusion, , Intravenous, Continuous, Milana Obey;  acetaminophen (TYLENOL) tablet 500 mg, 500 mg, Oral, Q4H PRN, Milana Obey;  albuterol (PROVENTIL) (5 MG/ML) 0.5% nebulizer solution 2.5 mg, 2.5 mg, Nebulization, Q6H PRN, Milana Obey aspirin chewable tablet 81 mg, 81 mg, Oral, Daily, Milana Obey, 81 mg at 08/01/11 0941;  budesonide-formoterol (SYMBICORT) 80-4.5 MCG/ACT inhaler 2 puff, 2 puff, Inhalation, BID, Milana Obey, 2 puff at 08/01/11 1032;  citalopram (CELEXA) tablet 20 mg, 20 mg, Oral, Daily, Milana Obey, 20 mg at 08/01/11 0941;  gabapentin (NEURONTIN) capsule 100 mg, 100 mg, Oral, TID, Mila Homer Knowlton, 100 mg at 08/01/11 0941 HYDROcodone-acetaminophen (NORCO) 5-325 MG per tablet 1 tablet, 1 tablet, Oral, Q6H PRN, Milana Obey;  nitroGLYCERIN (NITROSTAT) SL tablet 0.4 mg, 0.4 mg, Sublingual, Q5 min PRN, Milana Obey;  pantoprazole (PROTONIX) EC tablet 40 mg, 40 mg, Oral, 1 day or 1 dose, Milana Obey, 40 mg at 08/01/11 0747;   pantoprazole (PROTONIX) injection 40 mg, 40 mg, Intravenous, Once, Benny Lennert, MD, 40 mg at 07/31/11 2324 tiotropium [facility]) inhalation capsule 18 mcg, 18 mcg, Inhalation, Daily, Milana Obey, 18 mcg at 08/01/11 1031;  torsemide (DEMADEX) tablet 20 mg, 20 mg, Oral, Daily, Milana Obey, 20 mg at 08/01/11 2956   Past Medical History  Diagnosis Date  . COPD (chronic obstructive pulmonary disease)   . Hypertension   . Anxiety   . Depression   . Arthritis     Past Surgical History  Procedure Date  . Cholecystectomy     Family History  Problem Relation Age of Onset  . Diabetes Brother     History   Social History  . Marital Status: Widowed    Spouse Name: N/A    Number of Children: N/A  . Years of Education: N/A   Occupational History  . Not on file.   Social History Main Topics  . Smoking status: Former Smoker -- 2.0 packs/day for 50 years  . Smokeless tobacco: Former Neurosurgeon    Quit date: 12/31/2010  . Alcohol Use: No  . Drug Use: No  . Sexually Active: No   Other Topics Concern  . Not on file   Social History Narrative  . No narrative on file    ROS: See HPI Eyes: Negative Ears:Negative for hearing loss, tinnitus Cardiovascular: Negative for palpitations,irregular heartbeat, near-syncope, orthopnea, paroxysmal nocturnal dyspnia and syncope,edema, claudication, cyanosis,.  Respiratory:   Negative for cough, hemoptysis,  sleep disturbances due to breathing, sputum production and wheezing.   Endocrine: Negative for cold intolerance and heat  intolerance.  Hematologic/Lymphatic: Negative for adenopathy and bleeding problem. Does not bruise/bleed easily.  Musculoskeletal: Negative.   Gastrointestinal: positive for reflux and burning epigastric pain after eating tomatoes, increased belching,Negative for nausea, vomiting, abdominal pain, diarrhea, constipation.   Genitourinary: Negative for bladder incontinence, dysuria, flank pain, frequency,  hematuria, hesitancy, nocturia and urgency.  Neurological: Negative.  Allergic/Immunologic: Negative for environmental allergies.   PHYSICAL EXAM Well-nournished, in no acute distress. HEENT:Head is normocephalic without sign of trauma, extraocular eye movements are intact, pupils were equal and reactive to light accommodation, nasal mucosa is moist, throat is without erythema or exudate Neck: No JVD, HJR, Bruit, or thyroid enlargement Lungs: decreased breath sounds throughout,No tachypnea, clear without wheezing, rales, or rhonchi Cardiovascular: RRR, PMI not displaced, heart sounds normal, no murmurs, gallops, bruit, thrill, or heave. Abdomen: BS normal. Soft without organomegaly, masses, lesions or tenderness. Extremities: without cyanosis, clubbing or edema. Good distal pulses bilateral SKin: Warm, no lesions or rashes  Musculoskeletal: No deformities Neuro: no focal signs  BP 144/85  Pulse 54  Temp(Src) 97.7 F (36.5 C) (Oral)  Resp 22  Ht 5\' 2"  (1.575 m)  Wt 169 lb (76.658 kg)  BMI 30.91 kg/m2  SpO2 96%  ULTRASOUND OF ABDOMINAL AORTA  Technique: Ultrasound examination of the abdominal aorta was performed to evaluate for abdominal aortic aneurysm. Assessment limited by interference from bowel gas.  Comparison: CT abdomen 01/12/2010  Abdominal Aorta: Aneurysmal dilatation mid abdominal aorta.  Maximum AP diameter: 5.4 cm. Maximum TRV diameter: 5.2 cm. Aneurysm length: 5.7 cm  Common iliac arteries: 1.7 cm diameter right, 1.4 cm diameter left  IMPRESSION: Aneurysmal dilatation mid abdominal aorta, 5.4 x 5.2 cm greatest axial dimensions extending 5.7 cm length. Aneurysm has increased in size since prior CTA abdomen of 01/12/2010, when it measured a maximal 4.8 cm in diameter and 4.7 cm length.   Admission on 07/31/2011  Component Date Value Range Status  . Total CK (U/L) 07/31/2011 99  7-177 Final  . CK, MB (ng/mL) 07/31/2011 4.2* 0.3-4.0 Final  . Troponin I  (ng/mL) 07/31/2011 <0.30  <0.30 Final   Comment:                                 Due to the release kinetics of cTnI,                          a negative result within the first hours                          of the onset of symptoms does not rule out                          myocardial infarction with certainty.                          If myocardial infarction is still suspected,                          repeat the test at appropriate intervals.  . Relative Index  07/31/2011 RELATIVE INDEX IS INVALID  0.0-2.5 Final   Comment: WHEN CK < 100 U/L                                  .  WBC (K/uL) 07/31/2011 5.4  4.0-10.5 Final  . RBC (MIL/uL) 07/31/2011 4.10  3.87-5.11 Final  . Hemoglobin (g/dL) 16/10/9603 54.0  98.1-19.1 Final  . HCT (%) 07/31/2011 39.2  36.0-46.0 Final  . MCV (fL) 07/31/2011 95.6  78.0-100.0 Final  . MCH (pg) 07/31/2011 32.4  26.0-34.0 Final  . MCHC (g/dL) 47/82/9562 13.0  86.5-78.4 Final  . RDW (%) 07/31/2011 13.6  11.5-15.5 Final  . Platelets (K/uL) 07/31/2011 205  150-400 Final  . Neutrophils Relative (%) 07/31/2011 45  43-77 Final  . Neutro Abs (K/uL) 07/31/2011 2.4  1.7-7.7 Final  . Lymphocytes Relative (%) 07/31/2011 45  12-46 Final  . Lymphs Abs (K/uL) 07/31/2011 2.4  0.7-4.0 Final  . Monocytes Relative (%) 07/31/2011 8  3-12 Final  . Monocytes Absolute (K/uL) 07/31/2011 0.5  0.1-1.0 Final  . Eosinophils Relative (%) 07/31/2011 2  0-5 Final  . Eosinophils Absolute (K/uL) 07/31/2011 0.1  0.0-0.7 Final  . Basophils Relative (%) 07/31/2011 0  0-1 Final  . Basophils Absolute (K/uL) 07/31/2011 0.0  0.0-0.1 Final  . Sodium (mEq/L) 07/31/2011 136  135-145 Final  . Potassium (mEq/L) 07/31/2011 4.2  3.5-5.1 Final  . Chloride (mEq/L) 07/31/2011 100  96-112 Final  . CO2 (mEq/L) 07/31/2011 26  19-32 Final  . Glucose, Bld (mg/dL) 69/62/9528 413* 24-40 Final  . BUN (mg/dL) 10/27/2535 18  6-44 Final  . Creatinine, Ser (mg/dL) 03/47/4259 5.63  8.75-6.43 Final  . Calcium  (mg/dL) 32/95/1884 16.6  0.6-30.1 Final  . GFR calc non Af Amer (mL/min) 07/31/2011 >60  >60 Final  . GFR calc Af Amer (mL/min) 07/31/2011 >60  >60 Final   Comment:                                 The eGFR has been calculated                          using the MDRD equation.                          This calculation has not been                          validated in all clinical                          situations.                          eGFR's persistently                          <60 mL/min signify                          possible Chronic Kidney Disease.  . Total CK (U/L) 08/01/2011 97  7-177 Final  . CK, MB (ng/mL) 08/01/2011 4.1* 0.3-4.0 Final  . Troponin I (ng/mL) 08/01/2011 <0.30  <0.30 Final   Comment:                                 Due to the release kinetics of cTnI,  a negative result within the first hours                          of the onset of symptoms does not rule out                          myocardial infarction with certainty.                          If myocardial infarction is still suspected,                          repeat the test at appropriate intervals.  . Relative Index  08/01/2011 RELATIVE INDEX IS INVALID  0.0-2.5 Final   Comment: WHEN CK < 100 U/L                                    EKG:Sinus bradycardia at 59 beats per minute with first degree AV block, poor R-wave progression. CXR:  RLL scar/atelectasis.  ASSESSMENT AND PLAN: Chest pain:Patient has 2 different types of chest pain. I believe the chest pain she came in with was precipitated by excessive tomatoes that eased with belching. She also has exertional chest pain consistent with angina and multiple cardiac risk factors. CKs were normal MB is slightly elevated at 4.1 and 4.2, troponins negative.She will need a stress Myoview. This can probably be done as an outpatient. Abdominal aortic aneurysm:ultrasound showed AAA to be measured at 5.4 x 5.2 cm Hypertension: blood  pressure is up a little bit. Will add low dose Coreg. COPD:quit smoking in January after admission with pneumonia  Cardiology Attending-Martesha Niedermeier  Delightful older woman admitted with chest pain, which she attributes to a GI etiology.  No h/o GERD or other GI pathology.  No cardiac hx, but has a sizeable AAA.  Exam notable for raspy voice, left carotid bruit, increased AP diameter, decreased breath sounds at the left base with rales on the right, very distant heart sounds, distal pulses intact.  EKG normal except for first degree AVB.  MI ruled out.  We will proceed with an echocardiogram, carotid US and stress nuclear.  Pt is very game, says she doesn't feel 87 and is certain she can exercise on treadmill.  If test are negative, she can probably be discharged tomorrow PM.  Giddings Bing, MD

## 2011-08-01 NOTE — H&P (Signed)
Melanie Lowe MRN: [id number] DOB/AGE: [birth year] 77 y.o. Primary Care Physician:Bladyn Tipps L, MD Admit date: 07/31/2011 Chief Complaint: Chest discomfort HPI: This is an 77 year old Caucasian female who has multiple medical problems and who has come to the emergency room because of chest discomfort. She says it still is in the substernal region and she thinks it was related to having eaten spicy foods. It resolved to some extent by the time she came to the emergency room but she was brought in for rule out MI protocol  Past Medical History  Diagnosis Date  . COPD (chronic obstructive pulmonary disease)   . Hypertension   . Anxiety   . Depression   . Arthritis    She has an aortic aneurysm is well     Family History  Problem Relation Age of Onset  . Diabetes Brother    Her family history of coronary disease is unknown Social History:  reports that she has quit smoking. She quit smokeless tobacco use about 7 months ago. She reports that she does not drink alcohol or use illicit drugs. She lives alone  Allergies: No Known Allergies  Medications Prior to Admission  Medication Dose Route Frequency Provider Last Rate Last Dose  . 0.9 %  sodium chloride infusion   Intravenous Once Benny Lennert, MD 125 mL/hr at 07/31/11 2324    . 0.9 %  sodium chloride infusion   Intravenous Continuous Milana Obey      . acetaminophen (TYLENOL) tablet 500 mg  500 mg Oral Q4H PRN Milana Obey      . albuterol (PROVENTIL) (5 MG/ML) 0.5% nebulizer solution 2.5 mg  2.5 mg Nebulization Q6H PRN Milana Obey      . aspirin chewable tablet 81 mg  81 mg Oral Daily Milana Obey      . budesonide-formoterol (SYMBICORT) 80-4.5 MCG/ACT inhaler 2 puff  2 puff Inhalation BID Milana Obey      . citalopram (CELEXA) tablet 20 mg  20 mg Oral Daily Milana Obey      . gabapentin (NEURONTIN) capsule 100 mg  100 mg Oral TID Milana Obey      . HYDROcodone-acetaminophen  (NORCO) 5-325 MG per tablet 1 tablet  1 tablet Oral Q6H PRN Milana Obey      . nitroGLYCERIN (NITROSTAT) SL tablet 0.4 mg  0.4 mg Sublingual Q5 min PRN Milana Obey      . pantoprazole (PROTONIX) EC tablet 40 mg  40 mg Oral 1 day or 1 dose Mila Homer Knowlton   40 mg at 08/01/11 0747  . pantoprazole (PROTONIX) injection 40 mg  40 mg Intravenous Once Benny Lennert, MD   40 mg at 07/31/11 2324  . tiotropium (SPIRIVA) inhalation capsule 18 mcg  18 mcg Inhalation Daily Milana Obey      . torsemide (DEMADEX) tablet 20 mg  20 mg Oral Daily Milana Obey       Medications Prior to Admission  Medication Sig Dispense Refill  . Potassium Gluconate 2.5 MEQ TABS Take 1 tablet by mouth daily.             OZH:YQMVH from the symptoms mentioned above,there are no other symptoms referable to all systems reviewed.  Physical Exam: Blood pressure 144/85, pulse 54, temperature 97.7 F (36.5 C), temperature source Oral, resp. rate 22, height 5\' 2"  (1.575 m), weight 76.658 kg (169 lb), SpO2 98.00%. She is awake and alert he looks pretty comfortable. Her  HEENT exam shows a she's wearing oxygen she has several missing teeth her mucous membranes are moist. Her neck is supple without masses bruits or JVD. Her chest is relatively clear with some rhonchi. Her heart is regular without murmur gallop or rub. Her abdomen is soft mildly obese she has no masses in her bowel sounds are present and active. her extremities showed trace edema. She has no clubbing or cyanosis. Her central nervous system examination showed that she is somewhat anxious but does not have any focal findings  Basic Metabolic Panel:  Basename 07/31/11 2309  NA 136  K 4.2  CL 100  CO2 26  GLUCOSE 104*  BUN 18  CREATININE 0.84  CALCIUM 10.3  MG --  PHOS --   Liver Function Tests: No results found for this basename: AST:2,ALT:2,ALKPHOS:2,BILITOT:2,PROT:2,ALBUMIN:2 in the last 72 hours No results found for this  basename: LIPASE:2,AMYLASE:2 in the last 72 hours CBC:  Basename 07/31/11 2309  WBC 5.4  NEUTROABS 2.4  HGB 13.3  HCT 39.2  MCV 95.6  PLT 205   Cardiac Enzymes:  Basename 08/01/11 0434 07/31/11 2309  CKTOTAL 97 99  CKMB 4.1* 4.2*  CKMBINDEX -- --  TROPONINI <0.30 <0.30   BNP: No results found for this basename: POCBNP:3 in the last 72 hours D-Dimer: No results found for this basename: DDIMER:2 in the last 72 hours CBG: No results found for this basename: GLUCAP:6 in the last 72 hours Hemoglobin A1C: No results found for this basename: HGBA1C in the last 72 hours Fasting Lipid Panel: No results found for this basename: CHOL,HDL,LDLCALC,TRIG,CHOLHDL,LDLDIRECT in the last 72 hours Thyroid Function Tests: No results found for this basename: TSH,T4TOTAL,FREET4,T3FREE,THYROIDAB in the last 72 hours Anemia Panel: No results found for this basename: VITAMINB12,FOLATE,FERRITIN,TIBC,IRON,RETICCTPCT in the last 72 hours Urine Drug Screen:  Urinalysis:  Misc. Labs:  No results found for this or any previous visit (from the past 240 hour(s)).  US Aorta  07/13/2011  *RADIOLOGY REPORT*  Clinical Data:  Abdominal aortic aneurysm  ULTRASOUND OF ABDOMINAL AORTA  Technique:  Ultrasound examination of the abdominal aorta was performed to evaluate for abdominal aortic aneurysm. Assessment limited by interference from bowel gas.  Comparison: CT abdomen 01/12/2010  Abdominal Aorta:  Aneurysmal dilatation mid abdominal aorta.        Maximum AP diameter:  5.4 cm.       Maximum TRV diameter:  5.2 cm.       Aneurysm length: 5.7 cm        Common iliac arteries:  1.7 cm diameter right, 1.4 cm diameter left  IMPRESSION: Aneurysmal dilatation mid abdominal aorta, 5.4 x 5.2 cm greatest axial dimensions extending 5.7 cm length. Aneurysm has increased in size since prior CTA abdomen of 01/12/2010, when it measured a maximal 4.8 cm in diameter and 4.7 cm length.  Original Report Authenticated By: Lollie Marrow, M.D.   Dg Chest Portable 1 View  07/31/2011  *RADIOLOGY REPORT*  Clinical Data: Chest pain and short of breath  PORTABLE CHEST - 1 VIEW  Comparison: 01/08/2011  Findings: Cardiac enlargement without heart failure.  Right lower lobe density is unchanged compatible with scarring/atelectasis. Negative for pneumonia or effusion.  IMPRESSION: Right lower lobe scar/atelectasis.  No acute cardiopulmonary disease.  Original Report Authenticated By: Camelia Phenes, M.D.   Impression: She is admitted with chest discomfort which is atypical. She does have risk factors for coronary disease however. She has COPD which seems to be pretty stable. Active Problems:  * No  active hospital problems. *      Plan: She has minimally elevated CPK MB enzymes so I am going to go ahead and ask for cardiology consultation. She will continue with her other treatments.      Marciel Offenberger L 08/01/2011, 9:06 AM

## 2011-08-01 NOTE — ED Notes (Signed)
Report called to K. Maisie Fus, RN

## 2011-08-01 NOTE — ED Provider Notes (Signed)
History     Chief Complaint  Patient presents with  . Chest Pain   Patient is a 77 y.o. female presenting with chest pain. The history is provided by the patient.  Chest Pain The chest pain began 3 - 5 hours ago (Patient complains of chest pain off and on today with belching a lot some sweating as additional shortness of breath the pain did not radiate she did not feel dizzy). Chest pain occurs frequently. The chest pain is unchanged. The pain is associated with exertion. At its most intense, the pain is at 3/10. The pain is currently at 0/10. The quality of the pain is described as dull. Pertinent negatives for primary symptoms include no fever, no fatigue, no syncope, no cough and no abdominal pain.  Pertinent negatives for associated symptoms include no claudication.  Pertinent negatives for past medical history include no seizures.     Past Medical History  Diagnosis Date  . COPD (chronic obstructive pulmonary disease)     Past Surgical History  Procedure Date  . Cholecystectomy     Family History  Problem Relation Age of Onset  . Diabetes Brother     History  Substance Use Topics  . Smoking status: Former Games developer  . Smokeless tobacco: Not on file  . Alcohol Use: No    OB History    Grav Para Term Preterm Abortions TAB SAB Ect Mult Living                  Review of Systems  Constitutional: Negative for fever and fatigue.  HENT: Negative for congestion, sinus pressure and ear discharge.   Eyes: Negative for discharge.  Respiratory: Negative for cough.   Cardiovascular: Positive for chest pain. Negative for claudication and syncope.  Gastrointestinal: Negative for abdominal pain and diarrhea.  Genitourinary: Negative for frequency and hematuria.  Musculoskeletal: Negative for back pain.  Skin: Negative for rash.  Neurological: Negative for seizures and headaches.  Hematological: Negative.   Psychiatric/Behavioral: Negative for hallucinations.    Physical  Exam  BP 126/70  Pulse 57  Temp(Src) 98.4 F (36.9 C) (Oral)  Resp 16  Ht 5\' 2"  (1.575 m)  Wt 164 lb (74.39 kg)  BMI 30.00 kg/m2  SpO2 100%  Physical Exam  Constitutional: She is oriented to person, place, and time. She appears well-developed.  HENT:  Head: Normocephalic and atraumatic.  Eyes: Conjunctivae and EOM are normal. No scleral icterus.  Neck: Neck supple. No thyromegaly present.  Cardiovascular: Normal rate and regular rhythm.  Exam reveals no gallop and no friction rub.   No murmur heard. Pulmonary/Chest: No stridor. She has no wheezes. She has no rales. She exhibits no tenderness.  Abdominal: She exhibits no distension. There is no tenderness. There is no rebound.  Musculoskeletal: Normal range of motion. She exhibits no edema.  Lymphadenopathy:    She has no cervical adenopathy.  Neurological: She is oriented to person, place, and time. Coordination normal.  Skin: No rash noted. No erythema.  Psychiatric: She has a normal mood and affect. Her behavior is normal.    ED Course  Procedures  MDM Results for orders placed during the hospital encounter of 07/31/11  CARDIAC PANEL(CRET KIN+CKTOT+MB+TROPI)      Component Value Range   Total CK 99  7 - 177 (U/L)   CK, MB 4.2 (*) 0.3 - 4.0 (ng/mL)   Troponin I <0.30  <0.30 (ng/mL)   Relative Index RELATIVE INDEX IS INVALID  0.0 - 2.5  CBC      Component Value Range   WBC 5.4  4.0 - 10.5 (K/uL)   RBC 4.10  3.87 - 5.11 (MIL/uL)   Hemoglobin 13.3  12.0 - 15.0 (g/dL)   HCT 16.1  09.6 - 04.5 (%)   MCV 95.6  78.0 - 100.0 (fL)   MCH 32.4  26.0 - 34.0 (pg)   MCHC 33.9  30.0 - 36.0 (g/dL)   RDW 40.9  81.1 - 91.4 (%)   Platelets 205  150 - 400 (K/uL)  DIFFERENTIAL      Component Value Range   Neutrophils Relative 45  43 - 77 (%)   Neutro Abs 2.4  1.7 - 7.7 (K/uL)   Lymphocytes Relative 45  12 - 46 (%)   Lymphs Abs 2.4  0.7 - 4.0 (K/uL)   Monocytes Relative 8  3 - 12 (%)   Monocytes Absolute 0.5  0.1 - 1.0 (K/uL)    Eosinophils Relative 2  0 - 5 (%)   Eosinophils Absolute 0.1  0.0 - 0.7 (K/uL)   Basophils Relative 0  0 - 1 (%)   Basophils Absolute 0.0  0.0 - 0.1 (K/uL)  BASIC METABOLIC PANEL      Component Value Range   Sodium 136  135 - 145 (mEq/L)   Potassium 4.2  3.5 - 5.1 (mEq/L)   Chloride 100  96 - 112 (mEq/L)   CO2 26  19 - 32 (mEq/L)   Glucose, Bld 104 (*) 70 - 99 (mg/dL)   BUN 18  6 - 23 (mg/dL)   Creatinine, Ser 7.82  0.50 - 1.10 (mg/dL)   Calcium 95.6  8.4 - 10.5 (mg/dL)   GFR calc non Af Amer >60  >60 (mL/min)   GFR calc Af Amer >60  >60 (mL/min)    Date: 08/01/2011  Rate: 59  Rhythm: sinus bradycardia  QRS Axis: normal  Intervals: normal  ST/T Wave abnormalities: nonspecific ST changes  Conduction Disutrbances:none  Narrative Interpretation:   Old EKG Reviewed: none available        Benny Lennert, MD 08/01/11 [id number]

## 2011-08-01 NOTE — Progress Notes (Signed)
UR Chart Review Completed  

## 2011-08-01 NOTE — ED Notes (Signed)
Attempted to call report and nurse on break and will call for report once back

## 2011-08-02 ENCOUNTER — Observation Stay (HOSPITAL_COMMUNITY): Payer: Medicare HMO

## 2011-08-02 DIAGNOSIS — R079 Chest pain, unspecified: Secondary | ICD-10-CM

## 2011-08-02 DIAGNOSIS — I369 Nonrheumatic tricuspid valve disorder, unspecified: Secondary | ICD-10-CM

## 2011-08-02 LAB — LIPID PANEL
HDL: 46 mg/dL (ref >39)
LDL Cholesterol: 97 mg/dL (ref 0–99)
Total CHOL/HDL Ratio: 3.8 RATIO
Triglycerides: 167 mg/dL — ABNORMAL HIGH (ref <150)

## 2011-08-02 MED ORDER — ENOXAPARIN SODIUM 40 MG/0.4ML ~~LOC~~ SOLN
40.0000 mg | SUBCUTANEOUS | Status: DC
  Administered 2011-08-02 – 2011-08-04 (×3): 40 mg via SUBCUTANEOUS
  Filled 2011-08-02 (×3): qty 0.4

## 2011-08-02 NOTE — Progress Notes (Signed)
Subjective: She says she feels better. She still somewhat short of breath that she's been up and walking around. She has not had anymore chest pain. Cardiology consultation is noted and appreciated  Objective: Vital signs in last 24 hours: Temp:  [97.3 F (36.3 C)-97.8 F (36.6 C)] 97.3 F (36.3 C) (08/02 0438) Pulse Rate:  [55-71] 71  (08/02 0812) Resp:  [18] 18  (08/02 0438) BP: (102-131)/(62-85) 131/85 mmHg (08/02 0812) SpO2:  [95 %-97 %] 95 % (08/02 0438) Weight change:  Last BM Date: 07/31/11  Intake/Output from previous day: 08/01 0701 - 08/02 0700 In: 1250 [P.O.:980; I.V.:270] Out: 1 [Urine:1]  PHYSICAL EXAM General appearance: alert, cooperative and no distress Resp: rhonchi bilaterally Cardio: regular rate and rhythm, S1, S2 normal, no murmur, click, rub or gallop GI: soft, non-tender; bowel sounds normal; no masses,  no organomegaly Extremities: extremities normal, atraumatic, no cyanosis or edema  Lab Results:    Basic Metabolic Panel:  Basename 07/31/11 2309  NA 136  K 4.2  CL 100  CO2 26  GLUCOSE 104*  BUN 18  CREATININE 0.84  CALCIUM 10.3  MG --  PHOS --   Liver Function Tests: No results found for this basename: AST:2,ALT:2,ALKPHOS:2,BILITOT:2,PROT:2,ALBUMIN:2 in the last 72 hours No results found for this basename: LIPASE:2,AMYLASE:2 in the last 72 hours CBC:  Basename 07/31/11 2309  WBC 5.4  NEUTROABS 2.4  HGB 13.3  HCT 39.2  MCV 95.6  PLT 205   Cardiac Enzymes:  Basename 08/01/11 0434 07/31/11 2309  CKTOTAL 97 99  CKMB 4.1* 4.2*  CKMBINDEX -- --  TROPONINI <0.30 <0.30   BNP: No results found for this basename: POCBNP:3 in the last 72 hours D-Dimer: No results found for this basename: DDIMER:2 in the last 72 hours CBG: No results found for this basename: GLUCAP:6 in the last 72 hours Hemoglobin A1C: No results found for this basename: HGBA1C in the last 72 hours Fasting Lipid Panel: No results found for this basename:  CHOL,HDL,LDLCALC,TRIG,CHOLHDL,LDLDIRECT in the last 72 hours Thyroid Function Tests: No results found for this basename: TSH,T4TOTAL,FREET4,T3FREE,THYROIDAB in the last 72 hours Anemia Panel: No results found for this basename: VITAMINB12,FOLATE,FERRITIN,TIBC,IRON,RETICCTPCT in the last 72 hours Urine Drug Screen:  Urinalysis:  Misc. Labs:  ABGS No results found for this basename: PHART,PCO2,PO2ART,TCO2,HCO3 in the last 72 hours CULTURES No results found for this or any previous visit (from the past 240 hour(s)). Studies/Results: Dg Chest Portable 1 View  07/31/2011  *RADIOLOGY REPORT*  Clinical Data: Chest pain and short of breath  PORTABLE CHEST - 1 VIEW  Comparison: 01/08/2011  Findings: Cardiac enlargement without heart failure.  Right lower lobe density is unchanged compatible with scarring/atelectasis. Negative for pneumonia or effusion.  IMPRESSION: Right lower lobe scar/atelectasis.  No acute cardiopulmonary disease.  Original Report Authenticated By: Camelia Phenes, M.D.    Medications:  Scheduled:   . aspirin  81 mg Oral Daily  . budesonide-formoterol  2 puff Inhalation BID  . carvedilol  3.125 mg Oral BID WC  . citalopram  20 mg Oral Daily  . enoxaparin  40 mg Subcutaneous Q24H  . gabapentin  100 mg Oral TID  . pantoprazole  40 mg Oral 1 day or 1 dose  . sodium chloride      . tiotropium  18 mcg Inhalation Daily  . torsemide  20 mg Oral Daily   Continuous:   . sodium chloride 20 mL/hr at 08/01/11 1623   ZOX:[id number], albuterol, HYDROcodone-acetaminophen, nitroGLYCERIN  Assesment: She is better.  She has multiple medical problems. She has no new complaints. Active Problems:  Chest pain    Plan: She is sent for a stress test today and depending on the results of that she may be able to be discharged home.    LOS: 2 days   Annisten Manchester L 08/02/2011, 8:44 AM

## 2011-08-02 NOTE — Progress Notes (Signed)
Patient seen and examined. Reviewed NP note. She presented with chest pain which has not recurred, and cardiac markers are normal. Additional diagnosis of abdominal aortic aneurysm by CT scan, to be followed. She is scheduled for a Myoview as an inpatient.

## 2011-08-02 NOTE — Progress Notes (Signed)
*  PRELIMINARY RESULTS* Echocardiogram 2D Echocardiogram has been performed.  Conrad Van 08/02/2011, 10:56 AM

## 2011-08-02 NOTE — Progress Notes (Signed)
SUBJECTIVE:Denies chest pain.  She has been walking in halls without recurrence or shortness of breath.   Filed Vitals:   08/01/11 1650 08/01/11 2211 08/02/11 0438 08/02/11 0812  BP:  115/78 102/62 131/85  Pulse: 55 63 57 71  Temp:  97.3 F (36.3 C) 97.3 F (36.3 C)   TempSrc:  Oral Oral   Resp:  18 18   Height:      Weight:      SpO2: 97% 95% 95%     Intake/Output Summary (Last 24 hours) at 08/02/11 0914 Last data filed at 08/02/11 0553  Gross per 24 hour  Intake    950 ml  Output      0 ml  Net    950 ml    LABS: Basic Metabolic Panel:  [facility] 07/31/11 2309  NA 136  K 4.2  CL 100  CO2 26  GLUCOSE 104*  BUN 18  CREATININE 0.84  CALCIUM 10.3  MG --  PHOS --    CBC:  Basename 07/31/11 2309  WBC 5.4  NEUTROABS 2.4  HGB 13.3  HCT 39.2  MCV 95.6  PLT 205   Cardiac Enzymes:  Basename 08/01/11 0434 07/31/11 2309  CKTOTAL 97 99  CKMB 4.1* 4.2*  CKMBINDEX -- --  TROPONINI <0.30 <0.30   RADIOLOGY: US Aorta  07/13/2011  *RADIOLOGY REPORT*  Clinical Data:  Abdominal aortic aneurysm  ULTRASOUND OF ABDOMINAL AORTA  Technique:  Ultrasound examination of the abdominal aorta was performed to evaluate for abdominal aortic aneurysm. Assessment limited by interference from bowel gas.  Comparison: CT abdomen 01/12/2010  Abdominal Aorta:  Aneurysmal dilatation mid abdominal aorta.        Maximum AP diameter:  5.4 cm.       Maximum TRV diameter:  5.2 cm.       Aneurysm length: 5.7 cm        Common iliac arteries:  1.7 cm diameter right, 1.4 cm diameter left  IMPRESSION: Aneurysmal dilatation mid abdominal aorta, 5.4 x 5.2 cm greatest axial dimensions extending 5.7 cm length. Aneurysm has increased in size since prior CTA abdomen of 01/12/2010, when it measured a maximal 4.8 cm in diameter and 4.7 cm length.  Original Report Authenticated By: Lollie Marrow, M.D.   Dg Chest Portable 1 View  07/31/2011  *RADIOLOGY REPORT*  Clinical Data: Chest pain and short of breath   PORTABLE CHEST - 1 VIEW  Comparison: 01/08/2011  Findings: Cardiac enlargement without heart failure.  Right lower lobe density is unchanged compatible with scarring/atelectasis. Negative for pneumonia or effusion.  IMPRESSION: Right lower lobe scar/atelectasis.  No acute cardiopulmonary disease.  Original Report Authenticated By: Camelia Phenes, M.D.    PHYSICAL EXAM General: Well developed, well nourished, in no acute distress Head: Eyes PERRLA, No xanthomas.   Normal cephalic and atramatic  Lungs: Mild crackles bibasilar, no wheezes or rhonchi, no cough Heart: HRRR S1 S2,  Pulses are 2+ & equal.            No carotid bruit. No JVD.  No abdominal bruits. No femoral bruits. Abdomen: Bowel sounds are positive, abdomen soft and non-tender without masses or                  Hernia's noted. Msk:  Back normal, normal gait. Normal strength and tone for age. Extremities: No clubbing, cyanosis or edema.  DP +1 Neuro: Alert and oriented X 3. Psych:  Good affect, responds appropriately  TELEMETRY: Reviewed telemetry pt in NSR.:  ASSESSMENT AND PLAN:  1. Chest Pain:  No recurrence of chest pain. She has been up walking in the halls and has no shortness of breath or exertional angina.  She is scheduled for nuclear stress test in am. She ate breakfast this morning.  Requests to stay and have this done as IP. Now will be kept NPO for test in am. CE are negative.  2. AAA: Found on CT scan.  She will be monitored for this with frequent [place].  Measured at 5.4 X 5.2 with increased size since last evaluation.  3. COPD: She is not smoking at present. Uncertain if chest discomfort is related to this vs cardiac.  She has been stable at this time.  She has not smoked for 6 months she says.  More recommendations per Dr. Diona Browner.  Active Problems:  ANXIETY  DEPRESSION  HYPERTENSION  MYOCARDIAL INFARCTION, HX OF  ABDOMINAL AORTIC ANEURYSM  COPD  Sciatica  Chest pain    Melanie Lowe. Lyman Bishop NP

## 2011-08-03 ENCOUNTER — Observation Stay (HOSPITAL_COMMUNITY): Payer: Medicare HMO

## 2011-08-03 ENCOUNTER — Encounter (HOSPITAL_COMMUNITY): Payer: Self-pay | Admitting: Cardiology

## 2011-08-03 MED ORDER — REGADENOSON 0.4 MG/5ML IV SOLN
INTRAVENOUS | Status: AC
  Administered 2011-08-03: 0.4 mg via INTRAVENOUS
  Filled 2011-08-03: qty 5

## 2011-08-03 MED ORDER — TECHNETIUM TC 99M TETROFOSMIN IV KIT
10.0000 | PACK | Freq: Once | INTRAVENOUS | Status: AC | PRN
  Administered 2011-08-03: 10.1 via INTRAVENOUS

## 2011-08-03 MED ORDER — TECHNETIUM TC 99M TETROFOSMIN IV KIT
30.0000 | PACK | Freq: Once | INTRAVENOUS | Status: AC | PRN
  Administered 2011-08-03: 29.3 via INTRAVENOUS

## 2011-08-03 MED ORDER — SODIUM CHLORIDE 0.9 % IJ SOLN
INTRAMUSCULAR | Status: AC
  Administered 2011-08-03: 12:00:00
  Filled 2011-08-03: qty 10

## 2011-08-03 NOTE — Plan of Care (Signed)
Problem: Phase III Progression Outcomes Goal: Vascular site scale level 0 - I Vascular Site Scale Level 0: No bruising/bleeding/hematoma Level I (Mild): Bruising/Ecchymosis, minimal bleeding/ooozing, palpable hematoma < 3 cm Level II (Moderate): Bleeding not affecting hemodynamic parameters, pseudoaneurysm, palpable hematoma > 3 cm  Outcome: Not Applicable Date Met:  08/03/11 Level 0

## 2011-08-03 NOTE — Progress Notes (Signed)
SUBJECTIVE:Denies chest pain.  She has been comfortable.  Filed Vitals:   08/02/11 2016 08/02/11 2200 08/03/11 0600 08/03/11 0739  BP:  106/70 111/66   Pulse:  78 58   Temp:  98.6 F (37 C) 97.8 F (36.6 C)   TempSrc:  Oral Oral   Resp:  24 24   Height:      Weight:      SpO2: 94% 94% 97% 97%    Intake/Output Summary (Last 24 hours) at 08/03/11 1109 Last data filed at 08/03/11 0800  Gross per 24 hour  Intake    720 ml  Output      1 ml  Net    719 ml    LABS: Basic Metabolic Panel:  [facility] 07/31/11 2309  NA 136  K 4.2  CL 100  CO2 26  GLUCOSE 104*  BUN 18  CREATININE 0.84  CALCIUM 10.3  MG --  PHOS --    CBC:  Basename 07/31/11 2309  WBC 5.4  NEUTROABS 2.4  HGB 13.3  HCT 39.2  MCV 95.6  PLT 205   Cardiac Enzymes:  Basename 08/01/11 0434 07/31/11 2309  CKTOTAL 97 99  CKMB 4.1* 4.2*  CKMBINDEX -- --  TROPONINI <0.30 <0.30   RADIOLOGY: US Aorta  07/13/2011  *RADIOLOGY REPORT*  Clinical Data:  Abdominal aortic aneurysm  ULTRASOUND OF ABDOMINAL AORTA  Technique:  Ultrasound examination of the abdominal aorta was performed to evaluate for abdominal aortic aneurysm. Assessment limited by interference from bowel gas.  Comparison: CT abdomen 01/12/2010  Abdominal Aorta:  Aneurysmal dilatation mid abdominal aorta.        Maximum AP diameter:  5.4 cm.       Maximum TRV diameter:  5.2 cm.       Aneurysm length: 5.7 cm        Common iliac arteries:  1.7 cm diameter right, 1.4 cm diameter left  IMPRESSION: Aneurysmal dilatation mid abdominal aorta, 5.4 x 5.2 cm greatest axial dimensions extending 5.7 cm length. Aneurysm has increased in size since prior CTA abdomen of 01/12/2010, when it measured a maximal 4.8 cm in diameter and 4.7 cm length.  Original Report Authenticated By: Lollie Marrow, M.D.   Dg Chest Portable 1 View  07/31/2011  *RADIOLOGY REPORT*  Clinical Data: Chest pain and short of breath  PORTABLE CHEST - 1 VIEW  Comparison: 01/08/2011  Findings:  Cardiac enlargement without heart failure.  Right lower lobe density is unchanged compatible with scarring/atelectasis. Negative for pneumonia or effusion.  IMPRESSION: Right lower lobe scar/atelectasis.  No acute cardiopulmonary disease.  Original Report Authenticated By: Camelia Phenes, M.D.    PHYSICAL EXAM General: Well developed, well nourished, in no acute distress Head: Eyes PERRLA, No xanthomas.   Normal cephalic and atramatic  Lungs: Mild crackles bibasilar, no wheezes or rhonchi, no cough Heart: HRRR S1 S2,  Pulses are 2+ & equal.            No carotid bruit. No JVD.  No abdominal bruits. No femoral bruits. Abdomen: Bowel sounds are positive, abdomen soft and non-tender without masses or                  Hernia's noted. Msk:  Back normal, normal gait. Normal strength and tone for age. Extremities: No clubbing, cyanosis or edema.  DP +1 Neuro: Alert and oriented X 3. Psych:  Good affect, responds appropriately  TELEMETRY: Reviewed telemetry pt in NSR.:  ASSESSMENT AND PLAN:  1. Chest Pain:  No recurrence of chest pain. She has been up walking in the halls and has no shortness of breath or exertional angina.  She is scheduled for nuclear stress test this am. Resting study is completed. More recommendations per test results. If negative should be able to go home.  2. AAA: Found on CT scan.  She will be monitored for this with frequent [place].  Measured at 5.4 X 5.2 with increased size since last evaluation. We will follow this closely, with ultrasounds Q 6 months.  3. COPD: She is not smoking at present. Uncertain if chest discomfort is related to this vs cardiac.  She has been stable at this time.  She has not smoked for 6 months she says.  Active Problems:  ANXIETY  DEPRESSION  HYPERTENSION  MYOCARDIAL INFARCTION, HX OF  ABDOMINAL AORTIC ANEURYSM  COPD  Sciatica  Chest pain    Melanie Lowe. Lyman Bishop NP

## 2011-08-03 NOTE — Progress Notes (Signed)
Stress Lab Nurses Notes - Rozann Holts 08/03/2011  Reason for doing test: Chest Pain  Type of test: Steffanie Dunn  Nurse performing test: Marlena Clipper, RN  Nuclear Medicine Tech: Lyndel Pleasure  Echo Tech: Not Applicable  MD performing test: Alveria Apley  Family MD:   Test explained and consent signed: yes  IV started: 22g jelco, Saline lock flushed and Saline lock from floor  Symptoms: funny feeling and chest pressure  Treatment/Intervention: None  Reason test stopped: protocol completed  After recovery IV was: No redness or edema and Saline Lock flushed  Patient to return to Nuc. Med at :14:00  Patient discharged: Transported back to room 301 via WC via   Patient's Condition upon discharge was: stable  Comments: Symptoms resolved in recovery  Angelica Pou

## 2011-08-03 NOTE — Progress Notes (Signed)
No chest pain. For Myoview today as noted.

## 2011-08-03 NOTE — Progress Notes (Signed)
Melanie Lowe, Melanie Lowe                   ACCOUNT NO.:  [id number]  MEDICAL RECORD NO.:  [id number]  LOCATION:  A301                          FACILITY:  [facility]  PHYSICIAN:  Loralai Eisman D. Felecia Shelling, MD   DATE OF BIRTH:  1924/12/25  DATE OF PROCEDURE:  08/03/2011 DATE OF DISCHARGE:                                PROGRESS NOTE   SUBJECTIVE:  The patient feels better.  Her chest pain has improved.  OBJECTIVE:  GENERAL:  The patient is alert, awake, and resting.  She is scheduled for a stress test today.  No new changes in her overall condition. VITAL SIGNS:  Blood pressure 106/70, pulse 58, respiratory rate 20, and temperature 97.4 degrees Fahrenheit. CHEST:  Decreased air entry.  Few rhonchi. CARDIOVASCULAR:  First and second heart sounds heard.  No murmur.  No gallop. ABDOMEN:  Soft and lax.  Bowel sounds positive.  No mass or organomegaly. EXTREMITIES:  No leg edema.  ASSESSMENT: 1. Chest pain. 2. Chronic obstructive pulmonary disease.  PLAN:  We will continue telemetry.  We will continue current plan according to Cardiology.     Shirline Kendle D. Felecia Shelling, MD     TDF/MEDQ  D:  08/03/2011  T:  08/03/2011  Job:  [id number]

## 2011-08-04 NOTE — Progress Notes (Signed)
Pt discharged home today at 1115 per Dr. Renard Matter. Pt's VS stable at this time. Pt's IV site D/C'd and WNL. Pt provided with home medication list and discharge instructions. Pt verbalized understanding. Pt left floor via WC accompanied by NT in stable condition. Dagoberto Ligas, RN

## 2011-08-06 NOTE — Discharge Summary (Signed)
NAME:  Humphrey, Paul                        ACCOUNT NO.:  MEDICAL RECORD NO.:  [id number]  LOCATION:                                 FACILITY:  PHYSICIAN:  Quanisha Drewry D. Felecia Shelling, MD   DATE OF BIRTH:  04-23-1924  DATE OF ADMISSION:  08/01/2011 DATE OF DISCHARGE:  08/04/2012LH                              DISCHARGE SUMMARY   DISCHARGE DIAGNOSES: 1. Chest pain. 2. Chronic obstructive pulmonary disease. 3. Hypertension. 4. Anxiety/depression disorder. 5. Osteoarthritis.  DISCHARGE MEDICATIONS: 1. Atrovent nebulizer q.4 h p.r.n. 2. Aspirin 81 mg p.o. daily. 3. Symbicort 2 puffs twice daily. 4. Celexa 20 mg daily. 5. Gabapentin 100 mg daily. 6. Lortab 5/500 one tablet p.o. q.6 h p.r.n. 7. Potassium gluconate 2.5 mg daily. 8. Spiriva 1 capsule daily. 9. Furosemide 20 mg p.o. daily.  DISPOSITION:  The patient will be discharged home in stable condition.  HOSPITAL COURSE:  This is an 77 year old female patient with history of multiple medical illnesses who came to emergency room due to chest pain. She was admitted under telemetry, and serial EKG and cardiac enzymes were done which were negative.  Cardiology consult was done and the patient underwent stress test.  Her stress test was found to be negative.  Her chest pain has resolved.  The patient is going to be discharged to home in stable condition.     Nayab Aten D. Felecia Shelling, MD    TDF/MEDQ  D:  08/04/2011  T:  08/04/2011  Job:  [id number]

## 2011-08-09 ENCOUNTER — Encounter: Payer: Self-pay | Admitting: Vascular Surgery

## 2011-08-15 ENCOUNTER — Encounter: Payer: Self-pay | Admitting: Vascular Surgery

## 2011-08-15 ENCOUNTER — Ambulatory Visit (INDEPENDENT_AMBULATORY_CARE_PROVIDER_SITE_OTHER): Payer: Medicare HMO | Admitting: Vascular Surgery

## 2011-08-15 VITALS — BP 132/95 | HR 90 | Temp 97.8°F | Ht 63.0 in | Wt 164.0 lb

## 2011-08-15 DIAGNOSIS — I714 Abdominal aortic aneurysm, without rupture: Secondary | ICD-10-CM

## 2011-08-15 NOTE — Progress Notes (Signed)
Subjective:     Patient ID: Melanie Lowe, female   DOB: 08/10/1924, 77 y.o.   MRN: [id number]  HPI  This is a pleasant 77 year old woman with a known thoracoabdominal aneurysm who I last saw in January of 2011. At the level of the renal arteries the aneurysm was approximately 4.8 cm in maximum diameter. I thought she would be at high risk for thoracoabdominal repair given her pulmonary situation, and we discussed potentially sending her to [facility] for evaluation for thoracoabdominal repair if the aneurysm increased significantly in size. I plan on seeing her back in one year but she missed that appointment to she been in and out of the hospital. She had been admitted most recently with chest pain and her workup was unremarkable. Her symptoms were attributed to gastroesophageal reflux disease.  Since I saw her last, she has not had any significant abdominal pain. She does have chronic low back pain. This has been stable. There are no really no aggravating or alleviating factors associated with her back pain.  Past Medical History  Diagnosis Date  . COPD (chronic obstructive pulmonary disease)   . Hypertension   . Anxiety   . Depression   . Arthritis   . AAA (abdominal aortic aneurysm)   . Neuropathy   . Leg swelling   . Oxygen dependent   . Myocardial infarction     Family History  Problem Relation Age of Onset  . Diabetes Brother   . Cancer Brother     Throat cancer, Heart attack    History  Substance Use Topics  . Smoking status: Former Smoker -- 50 years    Types: Cigarettes    Quit date: 12/31/2010  . Smokeless tobacco: Former Neurosurgeon    Quit date: 12/31/2010  . Alcohol Use: No    No Known Allergies  Current Outpatient Prescriptions  Medication Sig Dispense Refill  . albuterol (PROVENTIL) (2.5 MG/3ML) 0.083% nebulizer solution Take 2.5 mg by nebulization every 6 (six) hours as needed. For shortness of breath      . aspirin 81 MG tablet Take 81 mg by mouth daily.          . budesonide-formoterol (SYMBICORT) 160-4.5 MCG/ACT inhaler Inhale 2 puffs into the lungs 2 (two) times daily.        . citalopram (CELEXA) 20 MG tablet Take 20 mg by mouth daily.        Marland Kitchen gabapentin (NEURONTIN) 100 MG tablet Take 100 mg by mouth 3 (three) times daily.        Marland Kitchen HYDROcodone-acetaminophen (NORCO) 5-325 MG per tablet Take 1 tablet by mouth every 6 (six) hours as needed. For pain      . Potassium Gluconate 2.5 MEQ TABS Take 1 tablet by mouth daily.        Marland Kitchen tiotropium (SPIRIVA) 18 MCG inhalation capsule Place 18 mcg into inhaler and inhale daily.       Marland Kitchen torsemide (DEMADEX) 20 MG tablet Take 20 mg by mouth daily.          Review of Systems  Constitutional: Negative.  Negative for fever and chills.  Eyes: Negative.   Respiratory: Positive for shortness of breath. Negative for chest tightness.   Cardiovascular: Negative.  Negative for chest pain and palpitations.  Gastrointestinal: Negative.   Genitourinary: Negative.   Musculoskeletal: Positive for back pain, joint swelling and arthralgias.  Skin: Negative.   Neurological: Positive for headaches.  Hematological: Bruises/bleeds easily.  Psychiatric/Behavioral: Positive for confusion.  Pt reports  having GERD.     Objective:   Physical Exam  Constitutional: She is oriented to person, place, and time.  Neck: Neck supple. No JVD present. No thyromegaly present.  Cardiovascular: Normal rate, regular rhythm and normal heart sounds.  Exam reveals no friction rub.   No murmur heard. Pulses:      Radial pulses are 2+ on the right side, and 2+ on the left side.       Femoral pulses are 2+ on the right side, and 2+ on the left side.      Dorsalis pedis pulses are 2+ on the right side, and 2+ on the left side.       Varicose veins bilaterally.  Pulmonary/Chest: Breath sounds normal. She has no wheezes. She has no rales.  Abdominal: Soft. Bowel sounds are normal. There is no tenderness.       No palpable aneurysm detected.   Musculoskeletal: She exhibits no edema.  Lymphadenopathy:    She has no cervical adenopathy.  Neurological: She is alert and oriented to person, place, and time. She has normal strength. No sensory deficit.  Skin: No lesion and no rash noted.   Filed Vitals:   08/15/11 1056  BP: 132/95  Pulse: 90  Temp: 97.8 F (36.6 C)    Body mass index is 29.05 kg/(m^2).  Patient had an ultrasound done [facility] which showed that the maximum diameter of her aorta was 5.4 cm. I have reviewed this study have also reviewed her nuclear medicine stress Myoview from 08/03/2011 which showed no areas of ischemia. Reviewed her carotid duplex from 8-12 which showed no significant carotid disease.        Assessment:     Her thoracoabdominal aneurysm has thus enlarged from 4.8 cm in January 2011 to 5.4 cm by ultrasound most recently. This is not an alarming rate of growth over the last year and a half. In addition one study was CAT scan the other study was ultrasound.  I began explained I think she would be very high risk for thoracoabdominal aneurysm repair given her pulmonary status. She is now on home O2 at 3 L. Power 5 explained that if she would consider this that I could refer her to Dr. Bennie Pierini in [place] to evaluate her for thoracoabdominal aneurysm repair. She does not feel that this is necessary at this time is currently she would not be willing to consider surgery.      Plan:     I have ordered CT scan of the chest abdomen and pelvis with IV contrast in 6 months and I will see her back at that time. If the aneurysm did show evidence of continued enlargement at that point and again will discuss potentially transferring her to [facility] for evaluation for repair. However given her age and pulmonary status clearly she would be at high risk for a thoracoabdominal repair.

## 2011-08-16 ENCOUNTER — Other Ambulatory Visit: Payer: Self-pay | Admitting: Vascular Surgery

## 2011-08-16 DIAGNOSIS — I714 Abdominal aortic aneurysm, without rupture: Secondary | ICD-10-CM

## 2011-10-09 LAB — I-STAT 8, (EC8 V) (CONVERTED LAB)
Acid-Base Excess: 3 — ABNORMAL HIGH
Chloride: 96
HCT: 49 — ABNORMAL HIGH
Operator id: 166561
Potassium: 4
TCO2: 30
pCO2, Ven: 46.6

## 2011-10-09 LAB — POCT CARDIAC MARKERS
CKMB, poc: 3.1
Operator id: 166561
Troponin i, poc: <0.05

## 2011-10-09 LAB — POCT I-STAT CREATININE: Operator id: 166561

## 2011-10-31 ENCOUNTER — Emergency Department (HOSPITAL_COMMUNITY): Payer: Medicare HMO

## 2011-10-31 ENCOUNTER — Encounter (HOSPITAL_COMMUNITY): Payer: Self-pay | Admitting: *Deleted

## 2011-10-31 ENCOUNTER — Other Ambulatory Visit: Payer: Self-pay

## 2011-10-31 ENCOUNTER — Observation Stay (HOSPITAL_COMMUNITY)
Admission: EM | Admit: 2011-10-31 | Discharge: 2011-11-02 | DRG: 313 | Disposition: A | Discharge status: Home Health | Payer: Medicare HMO | Attending: Pulmonary Disease | Admitting: Pulmonary Disease

## 2011-10-31 DIAGNOSIS — I714 Abdominal aortic aneurysm, without rupture, unspecified: Secondary | ICD-10-CM | POA: Diagnosis present

## 2011-10-31 DIAGNOSIS — R791 Abnormal coagulation profile: Secondary | ICD-10-CM | POA: Diagnosis present

## 2011-10-31 DIAGNOSIS — J449 Chronic obstructive pulmonary disease, unspecified: Secondary | ICD-10-CM | POA: Diagnosis present

## 2011-10-31 DIAGNOSIS — R079 Chest pain, unspecified: Secondary | ICD-10-CM | POA: Diagnosis present

## 2011-10-31 DIAGNOSIS — I10 Essential (primary) hypertension: Secondary | ICD-10-CM | POA: Diagnosis present

## 2011-10-31 DIAGNOSIS — F419 Anxiety disorder, unspecified: Secondary | ICD-10-CM | POA: Diagnosis present

## 2011-10-31 DIAGNOSIS — F411 Generalized anxiety disorder: Secondary | ICD-10-CM | POA: Diagnosis present

## 2011-10-31 DIAGNOSIS — R0789 Other chest pain: Principal | ICD-10-CM | POA: Diagnosis present

## 2011-10-31 DIAGNOSIS — J4489 Other specified chronic obstructive pulmonary disease: Secondary | ICD-10-CM | POA: Diagnosis present

## 2011-10-31 LAB — COMPREHENSIVE METABOLIC PANEL
ALT: 11 U/L (ref 0–35)
AST: 15 U/L (ref 0–37)
Alkaline Phosphatase: 78 U/L (ref 39–117)
CO2: 32 mEq/L (ref 19–32)
Chloride: 100 mEq/L (ref 96–112)
GFR calc Af Amer: 48 mL/min — ABNORMAL LOW (ref >90)
GFR calc non Af Amer: 41 mL/min — ABNORMAL LOW (ref >90)
Glucose, Bld: 104 mg/dL — ABNORMAL HIGH (ref 70–99)
Potassium: 3.7 mEq/L (ref 3.5–5.1)
Sodium: 137 mEq/L (ref 135–145)

## 2011-10-31 LAB — TROPONIN I: Troponin I: <0.3 ng/mL (ref <0.30)

## 2011-10-31 LAB — DIFFERENTIAL
Lymphocytes Relative: 39 % (ref 12–46)
Lymphs Abs: 2.2 10*3/uL (ref 0.7–4.0)
Monocytes Relative: 8 % (ref 3–12)
Neutro Abs: 3 10*3/uL (ref 1.7–7.7)
Neutrophils Relative %: 52 % (ref 43–77)

## 2011-10-31 LAB — CBC
Hemoglobin: 14 g/dL (ref 12.0–15.0)
MCH: 32.6 pg (ref 26.0–34.0)
RBC: 4.29 MIL/uL (ref 3.87–5.11)
WBC: 5.7 10*3/uL (ref 4.0–10.5)

## 2011-10-31 MED ORDER — SODIUM CHLORIDE 0.9 % IV BOLUS (SEPSIS): 500.0000 mL | Freq: Once | INTRAVENOUS | Status: DC

## 2011-10-31 MED ORDER — ASPIRIN 81 MG PO CHEW
324.0000 mg | CHEWABLE_TABLET | Freq: Once | ORAL | Status: AC
  Administered 2011-10-31: 324 mg via ORAL
  Filled 2011-10-31: qty 4

## 2011-10-31 MED ORDER — ENOXAPARIN SODIUM 80 MG/0.8ML ~~LOC~~ SOLN
1.0000 mg/kg | Freq: Once | SUBCUTANEOUS | Status: AC
  Administered 2011-11-01: 75 mg via SUBCUTANEOUS

## 2011-10-31 NOTE — ED Provider Notes (Signed)
History     CSN: [id number] Arrival date & time: 10/31/2011  8:02 PM   First MD Initiated Contact with Patient 10/31/11 2031      Chief Complaint  Patient presents with  . Pleurisy    (Consider location/radiation/quality/duration/timing/severity/associated sxs/prior treatment) HPI  Patient with left anterior chest pain began about 4 p.m.  States she had similar symptoms previously with pneumonia.  Patient states pain is sharp worsening with movement and with cough.  Pain is constant in nature.  Patient with some sob.  Patient with cough two weeks ago- seen by Dr. Juanetta Gosling and put on antibiotic and steroid.  She does not remember the name of the antibiotic and she finished it last week.  Patient was on cefuroxime per list she has with her.  Quit smoking last January.  She is on oxygen at home.    Past Medical History  Diagnosis Date  . COPD (chronic obstructive pulmonary disease)   . Hypertension   . Anxiety   . Depression   . Arthritis   . AAA (abdominal aortic aneurysm)   . Neuropathy   . Leg swelling   . Oxygen dependent   . Myocardial infarction     Past Surgical History  Procedure Date  . Cholecystectomy   . Appendectomy     Family History  Problem Relation Age of Onset  . Diabetes Brother   . Cancer Brother     Throat cancer, Heart attack    History  Substance Use Topics  . Smoking status: Former Smoker -- 50 years    Types: Cigarettes    Quit date: 12/31/2010  . Smokeless tobacco: Former Neurosurgeon    Quit date: 12/31/2010  . Alcohol Use: No    OB History    Grav Para Term Preterm Abortions TAB SAB Ect Mult Living                  Review of Systems  All other systems reviewed and are negative.    Allergies  Review of patient's allergies indicates no known allergies.  Home Medications   Current Outpatient Rx  Name Route Sig Dispense Refill  . ALBUTEROL SULFATE (2.5 MG/3ML) 0.083% IN NEBU Nebulization Take 2.5 mg by nebulization every 6 (six)  hours as needed. For shortness of breath    . ASPIRIN 81 MG PO TABS Oral Take 81 mg by mouth daily.      . BUDESONIDE-FORMOTEROL FUMARATE 160-4.5 MCG/ACT IN AERO Inhalation Inhale 2 puffs into the lungs 2 (two) times daily.      Marland Kitchen CITALOPRAM HYDROBROMIDE 20 MG PO TABS Oral Take 20 mg by mouth daily.      Marland Kitchen GABAPENTIN 100 MG PO TABS Oral Take 100 mg by mouth 3 (three) times daily.      Marland Kitchen HYDROCODONE-ACETAMINOPHEN 5-325 MG PO TABS Oral Take 1 tablet by mouth every 6 (six) hours as needed. For pain    . POTASSIUM GLUCONATE 2.5 MEQ PO TABS Oral Take 1 tablet by mouth daily.      Marland Kitchen TIOTROPIUM BROMIDE MONOHYDRATE 18 MCG IN CAPS Inhalation Place 18 mcg into inhaler and inhale daily.     . TORSEMIDE 20 MG PO TABS Oral Take 20 mg by mouth daily.        BP 99/74  Pulse 82  Temp(Src) 97.8 F (36.6 C) (Oral)  Resp 22  SpO2 90%  Physical Exam  Vitals reviewed. Constitutional: She is oriented to person, place, and time. She appears well-developed and well-nourished.  HENT:  Head: Normocephalic and atraumatic.  Mouth/Throat: Mucous membranes are dry.  Eyes: Conjunctivae are normal. Pupils are equal, round, and reactive to light.  Neck: Normal range of motion. Neck supple.  Cardiovascular: Normal rate.   Pulmonary/Chest: Effort normal. Rales: diffuse rhonchi.  Abdominal: Soft. Bowel sounds are normal.  Musculoskeletal: Normal range of motion. She exhibits no edema and no tenderness.  Neurological: She is alert and oriented to person, place, and time. She has normal reflexes.  Skin: Skin is warm and dry.  Psychiatric: She has a normal mood and affect.    ED Course  Procedures (including critical care time)  Labs Reviewed - No data to display No results found.   No diagnosis found.    MDM   Date: 10/31/2011  Rate: 53  Rhythm: normal sinus rhythm  QRS Axis: normal  Intervals: normal  ST/T Wave abnormalities: normal  Conduction Disutrbances:first-degree A-V block   Narrative  Interpretation:   Old EKG Reviewed: none available    Patient with atypical chest pain.  Plan rule out.  Patient previously had myoview that was normal.        Hilario Quarry, MD 11/01/11 2049

## 2011-10-31 NOTE — ED Notes (Signed)
Pt reports chest congestion, cough.  Recent treatment pneumonia, reports minimal improvement.

## 2011-11-01 ENCOUNTER — Inpatient Hospital Stay (HOSPITAL_COMMUNITY): Payer: Medicare HMO

## 2011-11-01 ENCOUNTER — Other Ambulatory Visit: Payer: Self-pay

## 2011-11-01 ENCOUNTER — Encounter (HOSPITAL_COMMUNITY): Payer: Self-pay

## 2011-11-01 LAB — CARDIAC PANEL(CRET KIN+CKTOT+MB+TROPI)
CK, MB: 3.1 ng/mL (ref 0.3–4.0)
Total CK: 68 U/L (ref 7–177)
Troponin I: <0.3 ng/mL (ref <0.30)

## 2011-11-01 MED ORDER — ENOXAPARIN SODIUM 80 MG/0.8ML ~~LOC~~ SOLN
SUBCUTANEOUS | Status: AC
  Filled 2011-11-01: qty 0.8

## 2011-11-01 MED ORDER — ENOXAPARIN SODIUM 40 MG/0.4ML ~~LOC~~ SOLN
40.0000 mg | SUBCUTANEOUS | Status: DC
  Administered 2011-11-01: 40 mg via SUBCUTANEOUS
  Filled 2011-11-01: qty 0.4

## 2011-11-01 MED ORDER — SODIUM CHLORIDE 0.9 % IN NEBU
INHALATION_SOLUTION | RESPIRATORY_TRACT | Status: AC
  Filled 2011-11-01: qty 3

## 2011-11-01 MED ORDER — CITALOPRAM HYDROBROMIDE 20 MG PO TABS
20.0000 mg | ORAL_TABLET | Freq: Every day | ORAL | Status: DC
  Administered 2011-11-01: 20 mg via ORAL
  Filled 2011-11-01: qty 1

## 2011-11-01 MED ORDER — HYDROCODONE-ACETAMINOPHEN 5-325 MG PO TABS
1.0000 | ORAL_TABLET | Freq: Four times a day (QID) | ORAL | Status: DC | PRN
Start: 2011-11-01 — End: 2011-11-02

## 2011-11-01 MED ORDER — ASPIRIN 81 MG PO CHEW
81.0000 mg | CHEWABLE_TABLET | Freq: Every day | ORAL | Status: DC
  Administered 2011-11-01 – 2011-11-02 (×2): 81 mg via ORAL
  Filled 2011-11-01 (×2): qty 1

## 2011-11-01 MED ORDER — BIOTENE DRY MOUTH MT LIQD
15.0000 mL | Freq: Two times a day (BID) | OROMUCOSAL | Status: DC
  Administered 2011-11-01 – 2011-11-02 (×3): 15 mL via OROMUCOSAL

## 2011-11-01 MED ORDER — GABAPENTIN 100 MG PO CAPS
100.0000 mg | ORAL_CAPSULE | Freq: Three times a day (TID) | ORAL | Status: DC
  Administered 2011-11-01 – 2011-11-02 (×4): 100 mg via ORAL
  Filled 2011-11-01 (×4): qty 1

## 2011-11-01 MED ORDER — IPRATROPIUM BROMIDE 0.02 % IN SOLN: 0.5000 mg | RESPIRATORY_TRACT | Status: DC

## 2011-11-01 MED ORDER — TORSEMIDE 20 MG PO TABS
20.0000 mg | ORAL_TABLET | Freq: Every day | ORAL | Status: DC
  Administered 2011-11-01 – 2011-11-02 (×2): 20 mg via ORAL
  Filled 2011-11-01 (×2): qty 1

## 2011-11-01 MED ORDER — BIOTENE DRY MOUTH MT LIQD: 15.0000 mL | Freq: Three times a day (TID) | OROMUCOSAL | Status: DC

## 2011-11-01 MED ORDER — TIOTROPIUM BROMIDE MONOHYDRATE 18 MCG IN CAPS
18.0000 ug | ORAL_CAPSULE | Freq: Every day | RESPIRATORY_TRACT | Status: DC
  Administered 2011-11-01 – 2011-11-02 (×2): 18 ug via RESPIRATORY_TRACT
  Filled 2011-11-01: qty 5

## 2011-11-01 MED ORDER — CHLORHEXIDINE GLUCONATE 0.12 % MT SOLN
15.0000 mL | Freq: Two times a day (BID) | OROMUCOSAL | Status: DC
  Filled 2011-11-01: qty 15

## 2011-11-01 MED ORDER — ALBUTEROL SULFATE (5 MG/ML) 0.5% IN NEBU
2.5000 mg | INHALATION_SOLUTION | RESPIRATORY_TRACT | Status: DC
  Administered 2011-11-01 – 2011-11-02 (×6): 2.5 mg via RESPIRATORY_TRACT
  Filled 2011-11-01 (×7): qty 0.5

## 2011-11-01 MED ORDER — IOHEXOL 350 MG/ML SOLN
100.0000 mL | Freq: Once | INTRAVENOUS | Status: AC | PRN
  Administered 2011-11-01: 100 mL via INTRAVENOUS

## 2011-11-01 MED ORDER — BUDESONIDE-FORMOTEROL FUMARATE 160-4.5 MCG/ACT IN AERO
2.0000 | INHALATION_SPRAY | Freq: Two times a day (BID) | RESPIRATORY_TRACT | Status: DC
  Administered 2011-11-01 – 2011-11-02 (×3): 2 via RESPIRATORY_TRACT
  Filled 2011-11-01: qty 6

## 2011-11-01 NOTE — H&P (Signed)
Melanie Lowe MRN: [id number] DOB/AGE: 06-18-1924 77 y.o. Primary Care Physician:Shatasha Lambing L, MD Admit date: 10/31/2011 Chief Complaint: Chest pain HPI: This is an 77 year old who came to the emergency because of chest pain. She said that it was rather sudden in onset felt somewhat like pressure but at times was more sharp. She had no nausea or vomiting and has had some cough. She was concerned that she might have had pneumonia because she's had in the past. She said it didn't hurt somewhat more when she took a deep breath. She has not had any fever.  Past Medical History  Diagnosis Date  . COPD (chronic obstructive pulmonary disease)   . Hypertension   . Anxiety   . Depression   . Arthritis   . AAA (abdominal aortic aneurysm)   . Neuropathy   . Leg swelling   . Oxygen dependent   . Myocardial infarction    Past Surgical History  Procedure Date  . Cholecystectomy   . Appendectomy         Family History  Problem Relation Age of Onset  . Diabetes Brother   . Cancer Brother     Throat cancer, Heart attack    Social History:  reports that she quit smoking about 10 months ago. Her smoking use included Cigarettes. She quit after 50 years of use. She quit smokeless tobacco use about 10 months ago. She reports that she does not drink alcohol or use illicit drugs.   Allergies: No Known Allergies  Medications Prior to Admission  Medication Dose Route Frequency Provider Last Rate Last Dose  . albuterol (PROVENTIL) (5 MG/ML) 0.5% nebulizer solution 2.5 mg  2.5 mg Nebulization Q4H WA [facility] Danell Vazquez   2.5 mg at 11/01/11 0731  . antiseptic oral rinse (BIOTENE) solution 15 mL  15 mL Mouth Rinse TID PC Bretta Bang, RN      . aspirin chewable tablet 324 mg  324 mg Oral Once Hilario Quarry, MD   324 mg at 10/31/11 2211  . aspirin chewable tablet 81 mg  81 mg Oral Daily Fredirick Maudlin      . budesonide-formoterol (SYMBICORT) 160-4.5 MCG/ACT inhaler 2 puff  2 puff Inhalation  BID Fredirick Maudlin      . chlorhexidine (PERIDEX) 0.12 % solution 15 mL  15 mL Mouth/Throat BID Geralynn Ochs McCurry, RN      . citalopram (CELEXA) tablet 20 mg  20 mg Oral QHS Fredirick Maudlin      . enoxaparin (LOVENOX) 80 MG/0.8ML injection           . enoxaparin (LOVENOX) injection 75 mg  1 mg/kg Subcutaneous Once Hilario Quarry, MD   75 mg at 11/01/11 0015  . gabapentin (NEURONTIN) capsule 100 mg  100 mg Oral TID Fredirick Maudlin      . HYDROcodone-acetaminophen (NORCO) 5-325 MG per tablet 1 tablet  1 tablet Oral Q6H PRN Fredirick Maudlin      . sodium chloride 0.9 % bolus 500 mL  500 mL Intravenous Once Hilario Quarry, MD      . sodium chloride 0.9 % nebulizer solution           . tiotropium (SPIRIVA) inhalation capsule 18 mcg  18 mcg Inhalation Daily Fredirick Maudlin      . torsemide (DEMADEX) tablet 20 mg  20 mg Oral Daily Fredirick Maudlin      . DISCONTD: ipratropium (ATROVENT) nebulizer solution 0.5 mg  0.5 mg  Nebulization Q4H WA Fredirick Maudlin       Medications Prior to Admission  Medication Sig Dispense Refill  . albuterol (PROVENTIL) (2.5 MG/3ML) 0.083% nebulizer solution Take 2.5 mg by nebulization every 6 (six) hours as needed. For shortness of breath      . aspirin 81 MG tablet Take 81 mg by mouth daily.        . budesonide-formoterol (SYMBICORT) 160-4.5 MCG/ACT inhaler Inhale 2 puffs into the lungs 2 (two) times daily.        . citalopram (CELEXA) 20 MG tablet Take 20 mg by mouth at bedtime.       . gabapentin (NEURONTIN) 100 MG tablet Take 100 mg by mouth 3 (three) times daily.        Marland Kitchen tiotropium (SPIRIVA) 18 MCG inhalation capsule Place 18 mcg into inhaler and inhale daily.       Marland Kitchen torsemide (DEMADEX) 20 MG tablet Take 20 mg by mouth daily.        Marland Kitchen HYDROcodone-acetaminophen (NORCO) 5-325 MG per tablet Take 1 tablet by mouth every 6 (six) hours as needed. For pain           [id number] from the symptoms mentioned above,there are no other symptoms referable to all systems  reviewed.  Physical Exam: Blood pressure 117/62, pulse 51, temperature 97.4 F (36.3 C), temperature source Oral, resp. rate 20, height 5\' 2"  (1.575 m), weight 73.846 kg (162 lb 12.8 oz), SpO2 94.00%. She is awake and alert. Her pupils are reactive. Her mucous membranes are slightly dry. Her neck is supple without masses bruits or JVD. Her chest shows bilateral rhonchi and minimal end expiratory wheeze. Her heart is regular without murmur gallop or rub. Her abdomen is soft without masses. She does have an abdominal aortic aneurysm. Her extremities showed no clubbing cyanosis or edema. Her central nervous system examination is grossly intact.  Results for orders placed during the hospital encounter of 10/31/11 (from the past 48 hour(s))  CBC     Status: Normal   Collection Time   10/31/11  8:46 PM      Component Value Range Comment   WBC 5.7  4.0 - 10.5 (K/uL)    RBC 4.29  3.87 - 5.11 (MIL/uL)    Hemoglobin 14.0  12.0 - 15.0 (g/dL)    HCT 54.0  98.1 - 19.1 (%)    MCV 96.5  78.0 - 100.0 (fL)    MCH 32.6  26.0 - 34.0 (pg)    MCHC 33.8  30.0 - 36.0 (g/dL)    RDW 47.8  29.5 - 62.1 (%)    Platelets 201  150 - 400 (K/uL)   DIFFERENTIAL     Status: Normal   Collection Time   10/31/11  8:46 PM      Component Value Range Comment   Neutrophils Relative 52  43 - 77 (%)    Neutro Abs 3.0  1.7 - 7.7 (K/uL)    Lymphocytes Relative 39  12 - 46 (%)    Lymphs Abs 2.2  0.7 - 4.0 (K/uL)    Monocytes Relative 8  3 - 12 (%)    Monocytes Absolute 0.4  0.1 - 1.0 (K/uL)    Eosinophils Relative 1  0 - 5 (%)    Eosinophils Absolute 0.1  0.0 - 0.7 (K/uL)    Basophils Relative 0  0 - 1 (%)    Basophils Absolute 0.0  0.0 - 0.1 (K/uL)   TROPONIN I  Status: Normal   Collection Time   10/31/11  8:46 PM      Component Value Range Comment   Troponin I <0.30  <0.30 (ng/mL)   COMPREHENSIVE METABOLIC PANEL     Status: Abnormal   Collection Time   10/31/11  8:46 PM      Component Value Range Comment   Sodium  137  135 - 145 (mEq/L)    Potassium 3.7  3.5 - 5.1 (mEq/L)    Chloride 100  96 - 112 (mEq/L)    CO2 32  19 - 32 (mEq/L)    Glucose, Bld 104 (*) 70 - 99 (mg/dL)    BUN 17  6 - 23 (mg/dL)    Creatinine, Ser 6.21 (*) 0.50 - 1.10 (mg/dL)    Calcium 9.7  8.4 - 10.5 (mg/dL)    Total Protein 6.7  6.0 - 8.3 (g/dL)    Albumin 3.4 (*) 3.5 - 5.2 (g/dL)    AST 15  0 - 37 (U/L)    ALT 11  0 - 35 (U/L)    Alkaline Phosphatase 78  39 - 117 (U/L)    Total Bilirubin 0.4  0.3 - 1.2 (mg/dL)    GFR calc non Af Amer 41 (*) >90 (mL/min)    GFR calc Af Amer 48 (*) >90 (mL/min)   D-DIMER, QUANTITATIVE     Status: Abnormal   Collection Time   10/31/11  8:46 PM      Component Value Range Comment   D-Dimer, Quant 0.90 (*) 0.00 - 0.48 (ug/mL-FEU)   APTT     Status: Normal   Collection Time   10/31/11  8:46 PM      Component Value Range Comment   aPTT 36  24 - 37 (seconds)   PROTIME-INR     Status: Normal   Collection Time   10/31/11  8:46 PM      Component Value Range Comment   Prothrombin Time 14.0  11.6 - 15.2 (seconds)    INR 1.06  0.00 - 1.49    CARDIAC PANEL(CRET KIN+CKTOT+MB+TROPI)     Status: Normal   Collection Time   11/01/11  4:53 AM      Component Value Range Comment   Total CK 68  7 - 177 (U/L)    CK, MB 3.1  0.3 - 4.0 (ng/mL)    Troponin I <0.30  <0.30 (ng/mL)    Relative Index RELATIVE INDEX IS INVALID  0.0 - 2.5     No results found for this or any previous visit (from the past 240 hour(s)).  Dg Chest 2 View  10/31/2011  *RADIOLOGY REPORT*  Clinical Data: Cough, congestion, and chest pain  CHEST - 2 VIEW  Comparison: 07/31/2011  Findings: Mild cardiac enlargement.  Atelectasis or fibrosis in both lung bases.  Emphysematous changes in the lungs.  No focal airspace consolidation.  No pneumothorax.  Thoracic scoliosis convex towards the right.  Tortuous and ectatic aorta with calcification.  IMPRESSION: Cardiac enlargement.  Fibrosis or atelectasis in the lung bases. Emphysematous changes.   Original Report Authenticated By: Marlon Pel, M.D.   Impression: She has chest pain. She has severe COPD. She does not have evidence of pneumonia. Her d-dimer is elevated so she's going to need to have a chest CT done. She has received one dose of full dose anticoagulation and I will address her anti-coagulation after I see the results of her CT. She has peripheral arterial disease Active Problems:  * No active  hospital problems. *      Plan: She'll have a CT done she will have pain medications available she will continue with her other treatments she may need full anticoagulation depending on results of the CT.      Dannica Bickham L 11/01/2011, 8:33 AM

## 2011-11-01 NOTE — Progress Notes (Signed)
Patient got 80 mg of Lovenox in the ER, but VTE not addressed on inpatient orders.  Please address this as well as code status of patient.

## 2011-11-02 LAB — CARDIAC PANEL(CRET KIN+CKTOT+MB+TROPI)
CK, MB: 2.9 ng/mL (ref 0.3–4.0)
Relative Index: INVALID (ref 0.0–2.5)
Total CK: 64 U/L (ref 7–177)
Troponin I: <0.3 ng/mL (ref <0.30)

## 2011-11-02 NOTE — Discharge Summary (Signed)
Physician Discharge Summary  Patient ID: Melanie Lowe MRN: [id number] DOB/AGE: [birth year] 77 y.o. Primary Care Physician:Adabella Stanis L, MD Admit date: 10/31/2011 Discharge date: 11/02/2011    Discharge Diagnoses:   Principal Problem:  *Chest pain Active Problems:  ANXIETY  HYPERTENSION  ABDOMINAL AORTIC ANEURYSM  COPD   Current Discharge Medication List    CONTINUE these medications which have NOT CHANGED   Details  albuterol (PROVENTIL) (2.5 MG/3ML) 0.083% nebulizer solution Take 2.5 mg by nebulization every 6 (six) hours as needed. For shortness of breath    aspirin 81 MG tablet Take 81 mg by mouth daily.      budesonide-formoterol (SYMBICORT) 160-4.5 MCG/ACT inhaler Inhale 2 puffs into the lungs 2 (two) times daily.      citalopram (CELEXA) 20 MG tablet Take 20 mg by mouth at bedtime.     gabapentin (NEURONTIN) 100 MG tablet Take 100 mg by mouth 3 (three) times daily.      tiotropium (SPIRIVA) 18 MCG inhalation capsule Place 18 mcg into inhaler and inhale daily.     torsemide (DEMADEX) 20 MG tablet Take 20 mg by mouth daily.      HYDROcodone-acetaminophen (NORCO) 5-325 MG per tablet Take 1 tablet by mouth every 6 (six) hours as needed. For pain        Discharged Condition: Improved    Consults: None  Significant Diagnostic Studies: Dg Chest 2 View  10/31/2011  *RADIOLOGY REPORT*  Clinical Data: Cough, congestion, and chest pain  CHEST - 2 VIEW  Comparison: 07/31/2011  Findings: Mild cardiac enlargement.  Atelectasis or fibrosis in both lung bases.  Emphysematous changes in the lungs.  No focal airspace consolidation.  No pneumothorax.  Thoracic scoliosis convex towards the right.  Tortuous and ectatic aorta with calcification.  IMPRESSION: Cardiac enlargement.  Fibrosis or atelectasis in the lung bases. Emphysematous changes.  Original Report Authenticated By: Marlon Pel, M.D.   Ct Angio Chest W/cm &/or Wo Cm  11/01/2011  *RADIOLOGY REPORT*  Clinical  Data:  Elevated D-dimer, chest pain  CT ANGIOGRAPHY CHEST WITH CONTRAST  Technique:  Multidetector CT imaging of the chest was performed using the standard protocol during bolus administration of intravenous contrast.  Multiplanar CT image reconstructions including MIPs were obtained to evaluate the vascular anatomy.  Contrast: OMNIPAQUE IOHEXOL 350 MG/ML IV SOLN  Comparison:  Chest x-ray of 10/31/2011 and CT chest of 05/03/2009  Findings:  The pulmonary arteries opacify, and there is no evidence of acute pulmonary embolism.  Atheromatous change is noted within the thoracic aorta particularly within the aortic arch.  However no acute abnormality is seen although opacification is suboptimal.  A few mediastinal nodes are present, none of which appear pathologically enlarged.  Coronary artery calcifications are noted and the heart is mildly enlarged.  There are mild emphysematous changes throughout the lungs.  Basilar atelectasis or scarring is noted.  No focal infiltrate or effusion is seen.  The pleural based nodular opacity in the right lung apex posteriorly appears stable compared to the prior CT.  No bony abnormality is seen.  Review of the MIP images confirms the above findings.  IMPRESSION:  1.  No evidence of acute pulmonary embolism. 2.  Atheromatous change throughout the thoracic aorta. 3.  Coronary artery calcifications and mild cardiomegaly. 4.  COPD.  Original Report Authenticated By: Juline Patch, M.D.    Lab Results: Results for orders placed during the hospital encounter of 10/31/11 (from the past 48 hour(s))  CBC  Status: Normal   Collection Time   10/31/11  8:46 PM      Component Value Range Comment   WBC 5.7  4.0 - 10.5 (K/uL)    RBC 4.29  3.87 - 5.11 (MIL/uL)    Hemoglobin 14.0  12.0 - 15.0 (g/dL)    HCT 16.1  09.6 - 04.5 (%)    MCV 96.5  78.0 - 100.0 (fL)    MCH 32.6  26.0 - 34.0 (pg)    MCHC 33.8  30.0 - 36.0 (g/dL)    RDW 40.9  81.1 - 91.4 (%)    Platelets 201  150 -  400 (K/uL)   DIFFERENTIAL     Status: Normal   Collection Time   10/31/11  8:46 PM      Component Value Range Comment   Neutrophils Relative 52  43 - 77 (%)    Neutro Abs 3.0  1.7 - 7.7 (K/uL)    Lymphocytes Relative 39  12 - 46 (%)    Lymphs Abs 2.2  0.7 - 4.0 (K/uL)    Monocytes Relative 8  3 - 12 (%)    Monocytes Absolute 0.4  0.1 - 1.0 (K/uL)    Eosinophils Relative 1  0 - 5 (%)    Eosinophils Absolute 0.1  0.0 - 0.7 (K/uL)    Basophils Relative 0  0 - 1 (%)    Basophils Absolute 0.0  0.0 - 0.1 (K/uL)   TROPONIN I     Status: Normal   Collection Time   10/31/11  8:46 PM      Component Value Range Comment   Troponin I <0.30  <0.30 (ng/mL)   COMPREHENSIVE METABOLIC PANEL     Status: Abnormal   Collection Time   10/31/11  8:46 PM      Component Value Range Comment   Sodium 137  135 - 145 (mEq/L)    Potassium 3.7  3.5 - 5.1 (mEq/L)    Chloride 100  96 - 112 (mEq/L)    CO2 32  19 - 32 (mEq/L)    Glucose, Bld 104 (*) 70 - 99 (mg/dL)    BUN 17  6 - 23 (mg/dL)    Creatinine, Ser 7.82 (*) 0.50 - 1.10 (mg/dL)    Calcium 9.7  8.4 - 10.5 (mg/dL)    Total Protein 6.7  6.0 - 8.3 (g/dL)    Albumin 3.4 (*) 3.5 - 5.2 (g/dL)    AST 15  0 - 37 (U/L)    ALT 11  0 - 35 (U/L)    Alkaline Phosphatase 78  39 - 117 (U/L)    Total Bilirubin 0.4  0.3 - 1.2 (mg/dL)    GFR calc non Af Amer 41 (*) >90 (mL/min)    GFR calc Af Amer 48 (*) >90 (mL/min)   D-DIMER, QUANTITATIVE     Status: Abnormal   Collection Time   10/31/11  8:46 PM      Component Value Range Comment   D-Dimer, Quant 0.90 (*) 0.00 - 0.48 (ug/mL-FEU)   APTT     Status: Normal   Collection Time   10/31/11  8:46 PM      Component Value Range Comment   aPTT 36  24 - 37 (seconds)   PROTIME-INR     Status: Normal   Collection Time   10/31/11  8:46 PM      Component Value Range Comment   Prothrombin Time 14.0  11.6 - 15.2 (seconds)    INR  1.06  0.00 - 1.49    CARDIAC PANEL(CRET KIN+CKTOT+MB+TROPI)     Status: Normal    Collection Time   11/01/11  4:53 AM      Component Value Range Comment   Total CK 68  7 - 177 (U/L)    CK, MB 3.1  0.3 - 4.0 (ng/mL)    Troponin I <0.30  <0.30 (ng/mL)    Relative Index RELATIVE INDEX IS INVALID  0.0 - 2.5    CARDIAC PANEL(CRET KIN+CKTOT+MB+TROPI)     Status: Normal   Collection Time   11/01/11 12:46 PM      Component Value Range Comment   Total CK 77  7 - 177 (U/L)    CK, MB 3.4  0.3 - 4.0 (ng/mL)    Troponin I <0.30  <0.30 (ng/mL)    Relative Index RELATIVE INDEX IS INVALID  0.0 - 2.5    CARDIAC PANEL(CRET KIN+CKTOT+MB+TROPI)     Status: Normal   Collection Time   11/02/11 12:16 AM      Component Value Range Comment   Total CK 64  7 - 177 (U/L)    CK, MB 2.9  0.3 - 4.0 (ng/mL)    Troponin I <0.30  <0.30 (ng/mL)    Relative Index RELATIVE INDEX IS INVALID  0.0 - 2.5     No results found for this or any previous visit (from the past 240 hour(s)).   Hospital Course: She was admitted with chest discomfort. She had serial cardiac enzymes and EKGs and ruled out for myocardial infarction. She had an elevated d-dimer so she underwent a CT check angiogram of the chest and that was negative for pulmonary embolus. It did show that she had COPD which is a known diagnoses. Her chest pain resolved spontaneously. She is back at baseline now  Discharge Exam: Blood pressure 113/69, pulse 55, temperature 98.1 F (36.7 C), temperature source Oral, resp. rate 16, height 5\' 2"  (1.575 m), weight 73.846 kg (162 lb 12.8 oz), SpO2 94.00%. She is awake and alert she has rhonchi on her chest examination she looks a little bit dyspneic at rest but this is her baseline  Disposition: Home with home health services  Discharge Orders    Future Appointments: Provider: Department: Dept Phone: Center:   02/13/2012 9:30 AM [facility] [phone] [facility]   02/13/2012 10:45 AM Chuck Hint, MD [facility] [phone] [facility]     Future Orders Please Complete By  Expires   Home Health      Questions: Responses:   To provide the following care/treatments PT    RN   Face-to-face encounter      Comments:   I Malikiah Debarr L certify that this patient is under my care and that I, or a nurse practitioner or physician's assistant working with me, had a face-to-face encounter that meets the physician face-to-face encounter requirements with this patient on 11/02/2011.       Questions: Responses:   The encounter with the patient was in whole, or in part, for the following medical condition, which is the primary reason for home health care copd,chest pain   I certify that, based on my findings, the following services are medically necessary home health services Nursing    Physical therapy   My clinical findings support the need for the above services High Risk for rehospitalization    Acute exacerbation of COPD    Complex treatment plan/patient with lack knowledge disease process and treatment  Further, I certify that my clinical findings support that this patient is homebound (i.e. absences from home require considerable and taxing effort and are for medical reasons or religious services or infrequently or of short duration when for other reasons) Ambulates short distances less than 300 feet   To provide the following care/treatments PT    RN        Signed: Sincerity Cedar L 11/02/2011, 8:55 AM

## 2011-11-02 NOTE — Progress Notes (Signed)
Pt discharged with instructions she verbalized understanding.  She left the floor via w/c in stable condition, with staff.

## 2011-11-02 NOTE — Progress Notes (Signed)
Subjective: She feels well and her chest pain has resolved. Her CT was negative for pulmonary embolus.  Objective: Vital signs in last 24 hours: Temp:  [97.5 F (36.4 C)-98.1 F (36.7 C)] 98.1 F (36.7 C) (11/02 0531) Pulse Rate:  [55-90] 55  (11/02 0531) Resp:  [16-24] 16  (11/02 0531) BP: (95-116)/(62-78) 113/69 mmHg (11/02 0531) SpO2:  [85 %-97 %] 94 % (11/02 0753) Weight change:  Last BM Date: 10/31/11  Intake/Output from previous day: 11/01 0701 - 11/02 0700 In: 720 [P.O.:720] Out: -   PHYSICAL EXAM General appearance: alert, cooperative and no distress Resp: rhonchi bilaterally Cardio: regular rate and rhythm, S1, S2 normal, no murmur, click, rub or gallop GI: soft, non-tender; bowel sounds normal; no masses,  no organomegaly Extremities: extremities normal, atraumatic, no cyanosis or edema  Lab Results:    Basic Metabolic Panel:  [facility] 10/31/11 2046  NA 137  K 3.7  CL 100  CO2 32  GLUCOSE 104*  BUN 17  CREATININE 1.17*  CALCIUM 9.7  MG --  PHOS --   Liver Function Tests:  Basename 10/31/11 2046  AST 15  ALT 11  ALKPHOS 78  BILITOT 0.4  PROT 6.7  ALBUMIN 3.4*   No results found for this basename: LIPASE:2,AMYLASE:2 in the last 72 hours No results found for this basename: AMMONIA:2 in the last 72 hours CBC:  Basename 10/31/11 2046  WBC 5.7  NEUTROABS 3.0  HGB 14.0  HCT 41.4  MCV 96.5  PLT 201   Cardiac Enzymes:  Basename 11/02/11 0016 11/01/11 1246 11/01/11 0453  CKTOTAL 64 77 68  CKMB 2.9 3.4 3.1  CKMBINDEX -- -- --  TROPONINI <0.30 <0.30 <0.30   BNP: No results found for this basename: POCBNP:3 in the last 72 hours D-Dimer:  Alvira Philips 10/31/11 2046  DDIMER 0.90*   CBG: No results found for this basename: GLUCAP:6 in the last 72 hours Hemoglobin A1C: No results found for this basename: HGBA1C in the last 72 hours Fasting Lipid Panel: No results found for this basename: CHOL,HDL,LDLCALC,TRIG,CHOLHDL,LDLDIRECT in the last  72 hours Thyroid Function Tests: No results found for this basename: TSH,T4TOTAL,FREET4,T3FREE,THYROIDAB in the last 72 hours Anemia Panel: No results found for this basename: VITAMINB12,FOLATE,FERRITIN,TIBC,IRON,RETICCTPCT in the last 72 hours Coagulation:  Basename 10/31/11 2046  INR 1.06   Urine Drug Screen:  Alcohol Level: No results found for this basename: ETH:2 in the last 72 hours Urinalysis:  Misc. Labs:  ABGS No results found for this basename: PHART,PCO2,PO2ART,TCO2,HCO3 in the last 72 hours CULTURES No results found for this or any previous visit (from the past 240 hour(s)). Studies/Results: Dg Chest 2 View  10/31/2011  *RADIOLOGY REPORT*  Clinical Data: Cough, congestion, and chest pain  CHEST - 2 VIEW  Comparison: 07/31/2011  Findings: Mild cardiac enlargement.  Atelectasis or fibrosis in both lung bases.  Emphysematous changes in the lungs.  No focal airspace consolidation.  No pneumothorax.  Thoracic scoliosis convex towards the right.  Tortuous and ectatic aorta with calcification.  IMPRESSION: Cardiac enlargement.  Fibrosis or atelectasis in the lung bases. Emphysematous changes.  Original Report Authenticated By: Marlon Pel, M.D.   Ct Angio Chest W/cm &/or Wo Cm  11/01/2011  *RADIOLOGY REPORT*  Clinical Data:  Elevated D-dimer, chest pain  CT ANGIOGRAPHY CHEST WITH CONTRAST  Technique:  Multidetector CT imaging of the chest was performed using the standard protocol during bolus administration of intravenous contrast.  Multiplanar CT image reconstructions including MIPs were obtained to evaluate the vascular  anatomy.  Contrast: OMNIPAQUE IOHEXOL 350 MG/ML IV SOLN  Comparison:  Chest x-ray of 10/31/2011 and CT chest of 05/03/2009  Findings:  The pulmonary arteries opacify, and there is no evidence of acute pulmonary embolism.  Atheromatous change is noted within the thoracic aorta particularly within the aortic arch.  However no acute abnormality is seen  although opacification is suboptimal.  A few mediastinal nodes are present, none of which appear pathologically enlarged.  Coronary artery calcifications are noted and the heart is mildly enlarged.  There are mild emphysematous changes throughout the lungs.  Basilar atelectasis or scarring is noted.  No focal infiltrate or effusion is seen.  The pleural based nodular opacity in the right lung apex posteriorly appears stable compared to the prior CT.  No bony abnormality is seen.  Review of the MIP images confirms the above findings.  IMPRESSION:  1.  No evidence of acute pulmonary embolism. 2.  Atheromatous change throughout the thoracic aorta. 3.  Coronary artery calcifications and mild cardiomegaly. 4.  COPD.  Original Report Authenticated By: Juline Patch, M.D.    Medications:  Prior to Admission:  Prescriptions prior to admission  Medication Sig Dispense Refill  . albuterol (PROVENTIL) (2.5 MG/3ML) 0.083% nebulizer solution Take 2.5 mg by nebulization every 6 (six) hours as needed. For shortness of breath      . aspirin 81 MG tablet Take 81 mg by mouth daily.        . budesonide-formoterol (SYMBICORT) 160-4.5 MCG/ACT inhaler Inhale 2 puffs into the lungs 2 (two) times daily.        . citalopram (CELEXA) 20 MG tablet Take 20 mg by mouth at bedtime.       . gabapentin (NEURONTIN) 100 MG tablet Take 100 mg by mouth 3 (three) times daily.        Marland Kitchen tiotropium (SPIRIVA) 18 MCG inhalation capsule Place 18 mcg into inhaler and inhale daily.       Marland Kitchen torsemide (DEMADEX) 20 MG tablet Take 20 mg by mouth daily.        Marland Kitchen HYDROcodone-acetaminophen (NORCO) 5-325 MG per tablet Take 1 tablet by mouth every 6 (six) hours as needed. For pain       Scheduled:   . albuterol  2.5 mg Nebulization Q4H WA  . antiseptic oral rinse  15 mL Mouth Rinse BID  . aspirin  81 mg Oral Daily  . budesonide-formoterol  2 puff Inhalation BID  . citalopram  20 mg Oral QHS  . enoxaparin      . enoxaparin  40 mg Subcutaneous  Q24H  . gabapentin  100 mg Oral TID  . sodium chloride  500 mL Intravenous Once  . sodium chloride      . tiotropium  18 mcg Inhalation Daily  . torsemide  20 mg Oral Daily  . DISCONTD: antiseptic oral rinse  15 mL Mouth Rinse TID PC  . DISCONTD: chlorhexidine  15 mL Mouth/Throat BID   Continuous:  [id number], iohexol  Assesment: Her chest pain has resolved. She does have severe COPD but did not show pneumonia. She has no complaints now. She ruled out for pulmonary embolus and for myocardial infarction Principal Problem:  *Chest pain Active Problems:  ANXIETY  HYPERTENSION  ABDOMINAL AORTIC ANEURYSM  COPD    Plan: She will be discharged home today please see discharge summary for details    LOS: 2 days   Talyn Eddie L 11/02/2011, 8:51 AM

## 2012-02-13 ENCOUNTER — Ambulatory Visit: Payer: Medicare HMO | Admitting: Vascular Surgery

## 2012-02-13 ENCOUNTER — Other Ambulatory Visit: Payer: Medicare HMO

## 2012-03-11 ENCOUNTER — Other Ambulatory Visit: Payer: Self-pay

## 2012-03-11 ENCOUNTER — Emergency Department (HOSPITAL_COMMUNITY)
Admission: EM | Admit: 2012-03-11 | Discharge: 2012-03-12 | Disposition: A | Discharge status: Home / Self Care | Payer: Medicare HMO | Attending: Emergency Medicine | Admitting: Emergency Medicine

## 2012-03-11 ENCOUNTER — Emergency Department (HOSPITAL_COMMUNITY): Payer: Medicare HMO

## 2012-03-11 ENCOUNTER — Encounter (HOSPITAL_COMMUNITY): Payer: Self-pay | Admitting: Emergency Medicine

## 2012-03-11 DIAGNOSIS — I10 Essential (primary) hypertension: Secondary | ICD-10-CM | POA: Insufficient documentation

## 2012-03-11 DIAGNOSIS — F341 Dysthymic disorder: Secondary | ICD-10-CM | POA: Insufficient documentation

## 2012-03-11 DIAGNOSIS — R61 Generalized hyperhidrosis: Secondary | ICD-10-CM | POA: Insufficient documentation

## 2012-03-11 DIAGNOSIS — J449 Chronic obstructive pulmonary disease, unspecified: Secondary | ICD-10-CM | POA: Insufficient documentation

## 2012-03-11 DIAGNOSIS — Z79899 Other long term (current) drug therapy: Secondary | ICD-10-CM | POA: Insufficient documentation

## 2012-03-11 DIAGNOSIS — R197 Diarrhea, unspecified: Secondary | ICD-10-CM | POA: Insufficient documentation

## 2012-03-11 DIAGNOSIS — M129 Arthropathy, unspecified: Secondary | ICD-10-CM | POA: Insufficient documentation

## 2012-03-11 DIAGNOSIS — I252 Old myocardial infarction: Secondary | ICD-10-CM | POA: Insufficient documentation

## 2012-03-11 DIAGNOSIS — R1110 Vomiting, unspecified: Secondary | ICD-10-CM | POA: Insufficient documentation

## 2012-03-11 DIAGNOSIS — Z7982 Long term (current) use of aspirin: Secondary | ICD-10-CM | POA: Insufficient documentation

## 2012-03-11 DIAGNOSIS — R079 Chest pain, unspecified: Secondary | ICD-10-CM | POA: Insufficient documentation

## 2012-03-11 DIAGNOSIS — J4489 Other specified chronic obstructive pulmonary disease: Secondary | ICD-10-CM | POA: Insufficient documentation

## 2012-03-11 DIAGNOSIS — R1013 Epigastric pain: Secondary | ICD-10-CM | POA: Insufficient documentation

## 2012-03-11 LAB — POCT I-STAT TROPONIN I: Troponin i, poc: 0 ng/mL (ref 0.00–0.08)

## 2012-03-11 LAB — CBC
Platelets: 194 10*3/uL (ref 150–400)
RDW: 13.1 % (ref 11.5–15.5)
WBC: 8.4 10*3/uL (ref 4.0–10.5)

## 2012-03-11 MED ORDER — NITROGLYCERIN 0.4 MG SL SUBL: 0.4000 mg | SUBLINGUAL_TABLET | SUBLINGUAL | Status: DC | PRN

## 2012-03-11 NOTE — ED Notes (Signed)
Patient complaining of chest pain starting today at approximately 1200. Vomited x 1 before arrival to ED. EMS stated patient was in respiratory distress en route to ED. Patient took one 325mg  Aspirin and EMS administered 1 nitro SL. Patient states it improved her pain.

## 2012-03-11 NOTE — ED Provider Notes (Addendum)
History  Scribed for Toy Baker, MD, the patient was seen in room APA06/APA06. This chart was scribed by Candelaria Stagers. The patient's care started at 11:04 PM    CSN: [id number]  Arrival date & time 03/11/12  2144   First MD Initiated Contact with Patient 03/11/12 2302      Chief Complaint  Patient presents with  . Chest Pain     The history is provided by the patient.   Melanie Lowe is a 78 y.o. female who was BIBA to the Emergency Department complaining of sudden onset of chest pain and vomiting that started about 12 hours ago.  She is also experiencing diaphoresis, abdominal pain, and watery diarrhea.  She denies fever, syncope.  Paramedics gave nitro in route which pt states helped relieve sx.  She reports that she has had a heart attack before and her current sx are not similar.  She has h/o stomach aneurysm.        Past Medical History  Diagnosis Date  . COPD (chronic obstructive pulmonary disease)   . Hypertension   . Anxiety   . Depression   . Arthritis   . AAA (abdominal aortic aneurysm)   . Neuropathy   . Leg swelling   . Oxygen dependent   . Myocardial infarction     Past Surgical History  Procedure Date  . Cholecystectomy   . Appendectomy     Family History  Problem Relation Age of Onset  . Diabetes Brother   . Cancer Brother     Throat cancer, Heart attack    History  Substance Use Topics  . Smoking status: Former Smoker -- 50 years    Types: Cigarettes    Quit date: 12/31/2010  . Smokeless tobacco: Former Neurosurgeon    Quit date: 12/31/2010  . Alcohol Use: No    OB History    Grav Para Term Preterm Abortions TAB SAB Ect Mult Living                  Review of Systems  Constitutional: Positive for diaphoresis. Negative for fever.  Cardiovascular: Positive for chest pain.  Gastrointestinal: Positive for abdominal pain and diarrhea.  Neurological: Negative for syncope.  All other systems reviewed and are negative.    Allergies    Review of patient's allergies indicates no known allergies.  Home Medications   Current Outpatient Rx  Name Route Sig Dispense Refill  . ALBUTEROL SULFATE (2.5 MG/3ML) 0.083% IN NEBU Nebulization Take 2.5 mg by nebulization at bedtime. For shortness of breath    . ASPIRIN EC 81 MG PO TBEC Oral Take 81 mg by mouth daily.    . BUDESONIDE-FORMOTEROL FUMARATE 160-4.5 MCG/ACT IN AERO Inhalation Inhale 2 puffs into the lungs 2 (two) times daily.      . FUROSEMIDE 40 MG PO TABS Oral Take 40 mg by mouth daily.    Marland Kitchen GABAPENTIN 100 MG PO TABS Oral Take 100 mg by mouth 5 (five) times daily.     Marland Kitchen TIOTROPIUM BROMIDE MONOHYDRATE 18 MCG IN CAPS Inhalation Place 18 mcg into inhaler and inhale daily.     . TORSEMIDE 20 MG PO TABS Oral Take 20 mg by mouth every morning.     Marland Kitchen CITALOPRAM HYDROBROMIDE 20 MG PO TABS Oral Take 20 mg by mouth at bedtime.     Marland Kitchen HYDROCODONE-ACETAMINOPHEN 5-325 MG PO TABS Oral Take 1 tablet by mouth every 6 (six) hours as needed. For pain    . NITROGLYCERIN  0.4 MG SL SUBL Sublingual Place 0.4 mg under the tongue every 5 (five) minutes as needed.      BP 104/82  Pulse 100  Temp 97.9 F (36.6 C)  Resp 22  Ht 5\' 2"  (1.575 m)  Wt 162 lb (73.483 kg)  BMI 29.63 kg/m2  SpO2 92%  Physical Exam  Nursing note and vitals reviewed. Constitutional: She is oriented to person, place, and time. She appears well-developed and well-nourished. No distress.  HENT:  Head: Normocephalic and atraumatic.  Eyes: EOM are normal. Right eye exhibits no discharge. Left eye exhibits no discharge.  Neck: Neck supple.  Cardiovascular: Normal rate.   Pulmonary/Chest:       rhonchi bilaterally  Abdominal:       Mild epigastric tenderness  Musculoskeletal: Normal range of motion. She exhibits no tenderness.  Neurological: She is alert and oriented to person, place, and time.  Skin: Skin is warm and dry. She is not diaphoretic.  Psychiatric: She has a normal mood and affect. Her behavior is normal.     ED Course  Procedures   DIAGNOSTIC STUDIES: Oxygen Saturation is 92% on room air, normal by my interpretation.    COORDINATION OF CARE:  11:16PM Ordered: Lipase, blood ; Hepatic function panel   Labs Reviewed  CBC - Abnormal; Notable for the following:    Hemoglobin 15.1 (*)    All other components within normal limits  POCT I-STAT TROPONIN I  BASIC METABOLIC PANEL  PRO B NATRIURETIC PEPTIDE  TROPONIN I  LIPASE, BLOOD  HEPATIC FUNCTION PANEL   Chest Portable 1 View  03/11/2012  *RADIOLOGY REPORT*  Clinical Data: Chest pain  PORTABLE CHEST - 1 VIEW  Comparison: 10/31/2011  Findings: Moderate cardiomegaly.  Pulmonary vascularity is within normal limits.  Low volumes.  Bibasilar atelectasis.  IMPRESSION: Cardiomegaly.  Bibasilar atelectasis.  Original Report Authenticated By: Donavan Burnet, M.D.     No diagnosis found.    MDM  Spoke with dr. Orvan Falconer from [facility], he has seen patient and obtained the history that pt has chronic abd pain from eating certain foods and that current sx are the same--no concern for aaa rupture or acs--he has cleared her for d/c I personally performed the services described in this documentation, which was scribed in my presence. The recorded information has been reviewed and considered.  6:45 AM Pt with known history of AAA that is nonoperative. Patient has no signs of rupture. She has not been hypotensive she's had syncope. She is at her baseline. No imaging done for this       Toy Baker, MD 03/12/12 0157  Toy Baker, MD 03/12/12 [phone]

## 2012-03-12 LAB — BASIC METABOLIC PANEL
Chloride: 101 mEq/L (ref 96–112)
GFR calc Af Amer: 58 mL/min — ABNORMAL LOW (ref >90)
Potassium: 4.3 mEq/L (ref 3.5–5.1)
Sodium: 135 mEq/L (ref 135–145)

## 2012-03-12 LAB — PRO B NATRIURETIC PEPTIDE: Pro B Natriuretic peptide (BNP): 134.8 pg/mL (ref 0–450)

## 2012-03-12 LAB — HEPATIC FUNCTION PANEL
Bilirubin, Direct: 0.1 mg/dL (ref 0.0–0.3)
Total Bilirubin: 0.6 mg/dL (ref 0.3–1.2)

## 2012-03-12 NOTE — Discharge Instructions (Signed)

## 2012-03-12 NOTE — ED Notes (Signed)
Covered patient with blanket and turned off lights. Patient lying in bed "trying to go to sleep." No complaints of pain at this time. No obvious distress noted.

## 2012-03-12 NOTE — ED Notes (Signed)
Family member called and states she lives in [place] and she will be here in a while to pick pt up

## 2012-03-12 NOTE — ED Notes (Signed)
Gave drink as requested.

## 2012-03-12 NOTE — ED Notes (Signed)
Pt says doesn't want to go home, wants to be admitted.  Notified Dr. Oletta Lamas, says was evaluated by hospitalist and Dr. Freida Busman and is cleared for discharge.  Reassured pt and pt ok with discharge.  Waiting for ride.

## 2012-03-12 NOTE — ED Notes (Signed)
Pt sat up beside bed around 0715 this am and ate breakfast.  Pt now resting in bed.

## 2012-03-12 NOTE — ED Notes (Signed)
Pt had small bm.  Cleaned pt, repositioned pt in bed, gave warm blanket.  Pt comfortable, coke given to drink.

## 2012-03-24 ENCOUNTER — Other Ambulatory Visit (HOSPITAL_COMMUNITY): Payer: Self-pay | Admitting: Pulmonary Disease

## 2012-03-24 DIAGNOSIS — I719 Aortic aneurysm of unspecified site, without rupture: Secondary | ICD-10-CM

## 2012-03-25 ENCOUNTER — Other Ambulatory Visit (HOSPITAL_COMMUNITY): Payer: Medicare HMO

## 2012-03-25 ENCOUNTER — Encounter: Payer: Self-pay | Admitting: Vascular Surgery

## 2012-03-25 ENCOUNTER — Encounter (HOSPITAL_COMMUNITY): Payer: Self-pay

## 2012-03-25 ENCOUNTER — Other Ambulatory Visit (HOSPITAL_COMMUNITY): Payer: Self-pay | Admitting: Pulmonary Disease

## 2012-03-25 ENCOUNTER — Ambulatory Visit (HOSPITAL_COMMUNITY)
Admission: RE | Admit: 2012-03-25 | Discharge: 2012-03-25 | Disposition: A | Discharge status: Home / Self Care | Payer: Medicare HMO | Source: Ambulatory Visit | Attending: Pulmonary Disease | Admitting: Pulmonary Disease

## 2012-03-25 DIAGNOSIS — I714 Abdominal aortic aneurysm, without rupture, unspecified: Secondary | ICD-10-CM | POA: Insufficient documentation

## 2012-03-25 DIAGNOSIS — I719 Aortic aneurysm of unspecified site, without rupture: Secondary | ICD-10-CM

## 2012-03-25 DIAGNOSIS — J449 Chronic obstructive pulmonary disease, unspecified: Secondary | ICD-10-CM | POA: Insufficient documentation

## 2012-03-25 DIAGNOSIS — Z09 Encounter for follow-up examination after completed treatment for conditions other than malignant neoplasm: Secondary | ICD-10-CM | POA: Insufficient documentation

## 2012-03-25 DIAGNOSIS — J4489 Other specified chronic obstructive pulmonary disease: Secondary | ICD-10-CM | POA: Insufficient documentation

## 2012-03-25 DIAGNOSIS — I10 Essential (primary) hypertension: Secondary | ICD-10-CM | POA: Insufficient documentation

## 2012-03-25 MED ORDER — IOHEXOL 300 MG/ML  SOLN
100.0000 mL | Freq: Once | INTRAMUSCULAR | Status: AC | PRN
  Administered 2012-03-25: 100 mL via INTRAVENOUS

## 2012-03-26 ENCOUNTER — Ambulatory Visit: Payer: Medicare HMO | Admitting: Vascular Surgery

## 2012-03-26 ENCOUNTER — Inpatient Hospital Stay: Admission: RE | Admit: 2012-03-26 | Payer: Medicare HMO | Source: Ambulatory Visit

## 2012-04-15 ENCOUNTER — Encounter: Payer: Self-pay | Admitting: Vascular Surgery

## 2012-04-16 ENCOUNTER — Ambulatory Visit (INDEPENDENT_AMBULATORY_CARE_PROVIDER_SITE_OTHER): Payer: Medicare HMO | Admitting: Vascular Surgery

## 2012-04-16 ENCOUNTER — Encounter: Payer: Self-pay | Admitting: Vascular Surgery

## 2012-04-16 VITALS — BP 116/80 | HR 101 | Temp 97.5°F | Ht 63.0 in | Wt 159.0 lb

## 2012-04-16 DIAGNOSIS — I712 Thoracic aortic aneurysm, without rupture: Secondary | ICD-10-CM

## 2012-04-16 DIAGNOSIS — I719 Aortic aneurysm of unspecified site, without rupture: Secondary | ICD-10-CM | POA: Insufficient documentation

## 2012-04-16 MED ORDER — MORPHINE SULFATE CR 15 MG PO TB12: 15.0000 mg | ORAL_TABLET | Freq: Two times a day (BID) | ORAL | Status: AC

## 2012-04-16 NOTE — Progress Notes (Signed)
[facility]  Patient name: Melanie Lowe MRN: [id number] DOB: Mar 07, 1924 Sex: female  REASON FOR VISIT: follow up of type IV thoracoabdominal aneurysm.  HPI: Melanie Lowe is a 78 y.o. female who I have been following with a known type IV thoracoabdominal aneurysm. I last saw her in August of 2012. At that time the infrarenal component of her aneurysm measured 5.4 cm in maximum diameter.  The aorta was approximately 4.8 cm at the level of the renal arteries on her previous CT scan. Given her age and significantly compromised pulmonary status at that she would be at very high risk for thoracoabdominal repair of her aneurysm although I offered to refer her to Dr. Bennie Pierini in [facility] for evaluation. At that time she did not wish to pursue this. She comes in for a follow up visit.  She has severe chronic low back pain but no new onset back pain or abdominal pain. She is on home O2 at 3 L nasal cannula. She gets short of breath with minimal exertion. Otherwise there has been no significant change in her medical history.  Past Medical History  Diagnosis Date  . COPD (chronic obstructive pulmonary disease)   . Hypertension   . Anxiety   . Depression   . Arthritis   . AAA (abdominal aortic aneurysm)   . Neuropathy   . Leg swelling   . Oxygen dependent   . Myocardial infarction     Family History  Problem Relation Age of Onset  . Diabetes Brother   . Cancer Brother     Throat cancer, Heart attack  . Heart disease Brother   . Heart attack Brother   . Cancer Father   . Cancer Sister     SOCIAL HISTORY: History  Substance Use Topics  . Smoking status: Former Smoker -- 50 years    Types: Cigarettes    Quit date: 12/31/2010  . Smokeless tobacco: Former Neurosurgeon    Quit date: 12/31/2010  . Alcohol Use: No    No Known Allergies  Current Outpatient Prescriptions  Medication Sig Dispense Refill  . albuterol (PROVENTIL) (2.5 MG/3ML) 0.083% nebulizer  solution Take 2.5 mg by nebulization at bedtime. For shortness of breath      . aspirin EC 81 MG tablet Take 81 mg by mouth daily.      . budesonide-formoterol (SYMBICORT) 160-4.5 MCG/ACT inhaler Inhale 2 puffs into the lungs 2 (two) times daily.        . citalopram (CELEXA) 20 MG tablet Take 20 mg by mouth at bedtime.       . diazepam (VALIUM) 5 MG tablet 5 mg 2 (two) times daily.      . furosemide (LASIX) 40 MG tablet Take 40 mg by mouth daily.      Marland Kitchen gabapentin (NEURONTIN) 100 MG tablet Take 100 mg by mouth 5 (five) times daily.       Marland Kitchen HYDROcodone-acetaminophen (NORCO) 10-325 MG per tablet every 6 (six) hours.      Marland Kitchen tiotropium (SPIRIVA) 18 MCG inhalation capsule Place 18 mcg into inhaler and inhale daily.       Marland Kitchen torsemide (DEMADEX) 20 MG tablet Take 20 mg by mouth every morning.       Marland Kitchen HYDROcodone-acetaminophen (NORCO) 5-325 MG per tablet Take 1 tablet by mouth every 6 (six) hours as needed. For pain      . morphine (MS CONTIN) 15 MG 12 hr tablet Take 1 tablet (15 mg total)  by mouth every 12 (twelve) hours.  30 tablet  0  . nitroGLYCERIN (NITROSTAT) 0.4 MG SL tablet Place 0.4 mg under the tongue every 5 (five) minutes as needed.        REVIEW OF SYSTEMS: Arly.Keller ] denotes positive finding; [  ] denotes negative finding  CARDIOVASCULAR:  Arly.Keller ] chest pain- stable   Arly.Keller ] chest pressure- stable   [ ]  palpitations   [ ]  orthopnea   [ ]  dyspnea on exertion   Arly.Keller ] claudication   [ ]  rest pain   [ ]  DVT   [ ]  phlebitis PULMONARY:   Arly.Keller ] productive cough   [ ]  asthma   Arly.Keller ] wheezing NEUROLOGIC:   Arly.Keller ] weakness- generalized  [ ]  paresthesias  [ ]  aphasia  [ ]  amaurosis  Arly.Keller ] dizziness HEMATOLOGIC:   [ ]  bleeding problems   [ ]  clotting disorders MUSCULOSKELETAL:  [ ]  joint pain   [ ]  joint swelling [ ]  leg swelling GASTROINTESTINAL: [ ]   blood in stool  [ ]   hematemesis GENITOURINARY:  [ ]   dysuria  [ ]   hematuria PSYCHIATRIC:  Arly.Keller ] history of major depression INTEGUMENTARY:  [ ]  rashes  [ ]   ulcers CONSTITUTIONAL:  [ ]  fever   [ ]  chills  PHYSICAL EXAM: Filed Vitals:   04/16/12 1617  BP: 116/80  Pulse: 101  Temp: 97.5 F (36.4 C)  TempSrc: Oral  Height: 5\' 3"  (1.6 m)  Weight: 159 lb (72.122 kg)  SpO2: 92%   Body mass index is 28.17 kg/(m^2). GENERAL: The patient is a well-nourished female, in no acute distress. The vital signs are documented above. CARDIOVASCULAR: There is a regular rate and rhythm without significant murmur appreciated. I do not detect carotid bruits. She has palpable femoral pulses. Both feet are warm and well-perfused. PULMONARY: There is good air exchange bilaterally without wheezing or rales. ABDOMEN: Soft and non-tender with normal pitched bowel sounds. Her aneurysm is palpable and nontender. MUSCULOSKELETAL: There are no major deformities or cyanosis. NEUROLOGIC: No focal weakness or paresthesias are detected. SKIN: There are no ulcers or rashes noted. She has varicose veins bilaterally. PSYCHIATRIC: The patient has a normal affect.  DATA:  I have reviewed her CT scan which was done on 03/25/2012. The maximum size of the infrarenal component of the aneurysm is 5.9 cm and thus has enlarged slightly. At the level of the celiac axis the aorta measures 3.1 cm. The aneurysm begins at the level of the SMA and both renal arteries.  MEDICAL ISSUES:  TYPE IV THORACOABDOMINAL ANEURYSM: Her aneurysm has enlarged to 5.9 cm. Given that this is a type IV thoracoabdominal aneurysm she would be extremely high risk for open repair given her age and compromised pulmonary status. I have again offered to refer her to Dr. Bennie Pierini in [place] to be evaluated for either a fenestrated graft or potentially an open thoracoabdominal repair. She is now agreeable to pursue this referral.  CHRONIC LOW BACK PAIN: this patient has persistent chronic low back pain and until she can be seen for further evaluation I have given her a prescription for 40 Percocet (7.5/325).  This pain is chronic and I do not think is related to her aneurysm.  Melanie Lowe S [facility] [place] Beeper: [phone]

## 2012-10-09 ENCOUNTER — Emergency Department (HOSPITAL_COMMUNITY): Payer: Medicare HMO

## 2012-10-09 ENCOUNTER — Encounter (HOSPITAL_COMMUNITY): Payer: Self-pay | Admitting: Emergency Medicine

## 2012-10-09 ENCOUNTER — Inpatient Hospital Stay (HOSPITAL_COMMUNITY)
Admission: EM | Admit: 2012-10-09 | Discharge: 2012-10-12 | DRG: 193 | Disposition: A | Discharge status: Home Health | Payer: Medicare HMO | Attending: Pulmonary Disease | Admitting: Pulmonary Disease

## 2012-10-09 DIAGNOSIS — M129 Arthropathy, unspecified: Secondary | ICD-10-CM | POA: Diagnosis present

## 2012-10-09 DIAGNOSIS — I252 Old myocardial infarction: Secondary | ICD-10-CM

## 2012-10-09 DIAGNOSIS — R079 Chest pain, unspecified: Secondary | ICD-10-CM | POA: Diagnosis present

## 2012-10-09 DIAGNOSIS — Z9981 Dependence on supplemental oxygen: Secondary | ICD-10-CM

## 2012-10-09 DIAGNOSIS — Z23 Encounter for immunization: Secondary | ICD-10-CM

## 2012-10-09 DIAGNOSIS — Z9089 Acquired absence of other organs: Secondary | ICD-10-CM

## 2012-10-09 DIAGNOSIS — I712 Thoracic aortic aneurysm, without rupture, unspecified: Secondary | ICD-10-CM | POA: Diagnosis present

## 2012-10-09 DIAGNOSIS — Z8249 Family history of ischemic heart disease and other diseases of the circulatory system: Secondary | ICD-10-CM

## 2012-10-09 DIAGNOSIS — Z79899 Other long term (current) drug therapy: Secondary | ICD-10-CM

## 2012-10-09 DIAGNOSIS — F411 Generalized anxiety disorder: Secondary | ICD-10-CM | POA: Diagnosis present

## 2012-10-09 DIAGNOSIS — J9620 Acute and chronic respiratory failure, unspecified whether with hypoxia or hypercapnia: Secondary | ICD-10-CM | POA: Diagnosis present

## 2012-10-09 DIAGNOSIS — G589 Mononeuropathy, unspecified: Secondary | ICD-10-CM | POA: Diagnosis present

## 2012-10-09 DIAGNOSIS — Z87891 Personal history of nicotine dependence: Secondary | ICD-10-CM

## 2012-10-09 DIAGNOSIS — J4489 Other specified chronic obstructive pulmonary disease: Secondary | ICD-10-CM | POA: Diagnosis present

## 2012-10-09 DIAGNOSIS — J189 Pneumonia, unspecified organism: Principal | ICD-10-CM | POA: Diagnosis present

## 2012-10-09 DIAGNOSIS — I719 Aortic aneurysm of unspecified site, without rupture: Secondary | ICD-10-CM | POA: Diagnosis present

## 2012-10-09 DIAGNOSIS — F329 Major depressive disorder, single episode, unspecified: Secondary | ICD-10-CM | POA: Diagnosis present

## 2012-10-09 DIAGNOSIS — I10 Essential (primary) hypertension: Secondary | ICD-10-CM | POA: Diagnosis present

## 2012-10-09 DIAGNOSIS — E860 Dehydration: Secondary | ICD-10-CM | POA: Diagnosis present

## 2012-10-09 DIAGNOSIS — F3289 Other specified depressive episodes: Secondary | ICD-10-CM | POA: Diagnosis present

## 2012-10-09 DIAGNOSIS — J449 Chronic obstructive pulmonary disease, unspecified: Secondary | ICD-10-CM | POA: Diagnosis present

## 2012-10-09 DIAGNOSIS — Z7982 Long term (current) use of aspirin: Secondary | ICD-10-CM

## 2012-10-09 LAB — HEPATIC FUNCTION PANEL
ALT: 19 U/L (ref 0–35)
Albumin: 3.9 g/dL (ref 3.5–5.2)
Alkaline Phosphatase: 99 U/L (ref 39–117)
Total Bilirubin: 0.3 mg/dL (ref 0.3–1.2)

## 2012-10-09 LAB — CBC WITH DIFFERENTIAL/PLATELET
Basophils Absolute: 0 10*3/uL (ref 0.0–0.1)
Eosinophils Absolute: 0.2 10*3/uL (ref 0.0–0.7)
Lymphocytes Relative: 27 % (ref 12–46)
Lymphs Abs: 1.9 10*3/uL (ref 0.7–4.0)
MCH: 32.9 pg (ref 26.0–34.0)
Neutrophils Relative %: 64 % (ref 43–77)
Platelets: 248 10*3/uL (ref 150–400)
RBC: 4.5 MIL/uL (ref 3.87–5.11)
RDW: 13.3 % (ref 11.5–15.5)
WBC: 7.3 10*3/uL (ref 4.0–10.5)

## 2012-10-09 LAB — LIPASE, BLOOD: Lipase: 30 U/L (ref 11–59)

## 2012-10-09 LAB — BASIC METABOLIC PANEL
GFR calc Af Amer: 45 mL/min — ABNORMAL LOW (ref >90)
GFR calc non Af Amer: 39 mL/min — ABNORMAL LOW (ref >90)
Glucose, Bld: 152 mg/dL — ABNORMAL HIGH (ref 70–99)
Potassium: 4.1 mEq/L (ref 3.5–5.1)
Sodium: 137 mEq/L (ref 135–145)

## 2012-10-09 LAB — TROPONIN I: Troponin I: <0.3 ng/mL (ref <0.30)

## 2012-10-09 NOTE — ED Provider Notes (Addendum)
History   This chart was scribed for Benny Lennert, MD by Gerlean Ren. This patient was seen in room APA11/APA11 and the patient's care was started at 23:09.   CSN: [id number]  Arrival date & time 10/09/12  2136   First MD Initiated Contact with Patient 10/09/12 2307      Chief Complaint  Patient presents with  . Abdominal Pain  . hx of AAA    (Consider location/radiation/quality/duration/timing/severity/associated sxs/prior treatment) Patient is a 78 y.o. female presenting with abdominal pain. The history is provided by the patient. No language interpreter was used.  Abdominal Pain The primary symptoms of the illness include abdominal pain. The primary symptoms of the illness do not include fatigue or diarrhea. The current episode started 3 to 5 hours ago. The onset of the illness was sudden. The problem has been gradually improving.  Associated with: nothing. The patient states that she believes she is currently not pregnant. Symptoms associated with the illness do not include hematuria, frequency or back pain.   Melanie Lowe is a 78 y.o. female who presents to the Emergency Department complaining of non-radiating left lower chest pain with sudden onset several hours ago and associated sweating and clammy hands shortly after CP onset.  Pt reports h/o similar chest pain.  Pt is on home O2 through [place].  Pt denies fever, neck pain, sore throat, visual disturbance, cough, dyspnea, abdominal pain, nausea, emesis, diarrhea, urinary symptoms, back pain, HA, weakness, numbness and rash as associated symptoms.  Pt is a former everyday smoker and denies alcohol use.      Past Medical History  Diagnosis Date  . COPD (chronic obstructive pulmonary disease)   . Hypertension   . Anxiety   . Depression   . Arthritis   . AAA (abdominal aortic aneurysm)   . Neuropathy   . Leg swelling   . Oxygen dependent   . Myocardial infarction     Past Surgical History  Procedure Date  . Cholecystectomy    . Appendectomy     Family History  Problem Relation Age of Onset  . Diabetes Brother   . Cancer Brother     Throat cancer, Heart attack  . Heart disease Brother   . Heart attack Brother   . Cancer Father   . Cancer Sister     History  Substance Use Topics  . Smoking status: Former Smoker -- 50 years    Types: Cigarettes    Quit date: 12/31/2010  . Smokeless tobacco: Former Neurosurgeon    Quit date: 12/31/2010  . Alcohol Use: No    No OB history provided.  Review of Systems  Constitutional: Negative for fatigue.  HENT: Negative for congestion, sinus pressure and ear discharge.   Eyes: Negative for discharge.  Respiratory: Negative for cough.   Cardiovascular: Negative for chest pain.  Gastrointestinal: Positive for abdominal pain. Negative for diarrhea.  Genitourinary: Negative for frequency and hematuria.  Musculoskeletal: Negative for back pain.  Skin: Negative for rash.  Neurological: Negative for seizures and headaches.  Hematological: Negative.   Psychiatric/Behavioral: Negative for hallucinations.    Allergies  Other and Tomato  Home Medications   Current Outpatient Rx  Name Route Sig Dispense Refill  . ALBUTEROL SULFATE (2.5 MG/3ML) 0.083% IN NEBU Nebulization Take 2.5 mg by nebulization at bedtime. For shortness of breath    . ASPIRIN EC 81 MG PO TBEC Oral Take 81 mg by mouth daily.    . BUDESONIDE-FORMOTEROL FUMARATE 160-4.5 MCG/ACT IN AERO  Inhalation Inhale 2 puffs into the lungs 2 (two) times daily.      Marland Kitchen CITALOPRAM HYDROBROMIDE 20 MG PO TABS Oral Take 20 mg by mouth at bedtime.     Marland Kitchen DIAZEPAM 5 MG PO TABS Oral Take 5 mg by mouth 2 (two) times daily.     . FUROSEMIDE 40 MG PO TABS Oral Take 40 mg by mouth daily.    Marland Kitchen GABAPENTIN 100 MG PO TABS Oral Take 100 mg by mouth 5 (five) times daily.     Marland Kitchen HYDROCODONE-ACETAMINOPHEN 10-325 MG PO TABS Oral Take 1 tablet by mouth every 6 (six) hours as needed. For pain    . TIOTROPIUM BROMIDE MONOHYDRATE 18 MCG IN CAPS  Inhalation Place 18 mcg into inhaler and inhale daily.     . TORSEMIDE 20 MG PO TABS Oral Take 20 mg by mouth every morning.       BP 126/86  Pulse 79  Temp 98 F (36.7 C) (Oral)  Resp 22  Ht 5\' 2"  (1.575 m)  Wt 150 lb (68.04 kg)  BMI 27.44 kg/m2  SpO2 89%  Physical Exam  Nursing note and vitals reviewed. Constitutional: She is oriented to person, place, and time. She appears well-developed.  HENT:  Head: Normocephalic and atraumatic.  Eyes: Conjunctivae normal and EOM are normal. No scleral icterus.  Neck: Neck supple. No thyromegaly present.  Cardiovascular: Normal rate and regular rhythm.  Exam reveals no gallop and no friction rub.   No murmur heard. Pulmonary/Chest: No stridor. She has no wheezes. She has no rales. She exhibits no tenderness.       Left lower chest wall tenderness.  Abdominal: She exhibits no distension. There is no tenderness. There is no rebound.  Musculoskeletal: Normal range of motion. She exhibits no edema.  Lymphadenopathy:    She has no cervical adenopathy.  Neurological: She is alert and oriented to person, place, and time. Coordination normal.  Skin: No rash noted. No erythema.  Psychiatric: She has a normal mood and affect. Her behavior is normal.    ED Course  Procedures (including critical care time) DIAGNOSTIC STUDIES: Oxygen Saturation is 89% on [place], low by my interpretation.    COORDINATION OF CARE: 23:12- Patient informed of clinical course, understands medical decision-making process, and agrees with plan.  Ordered CBC, b-met., lipase, troponin, hepatic function panel, and chest XR.    Labs Reviewed  BASIC METABOLIC PANEL - Abnormal; Notable for the following:    Glucose, Bld 152 (*)     BUN 31 (*)     Creatinine, Ser 1.23 (*)     GFR calc non Af Amer 39 (*)     GFR calc Af Amer 45 (*)     All other components within normal limits  CBC WITH DIFFERENTIAL   Results for orders placed during the hospital encounter of 10/09/12    CBC WITH DIFFERENTIAL      Component Value Range   WBC 7.3  4.0 - 10.5 K/uL   RBC 4.50  3.87 - 5.11 MIL/uL   Hemoglobin 14.8  12.0 - 15.0 g/dL   HCT 16.1  09.6 - 04.5 %   MCV 96.2  78.0 - 100.0 fL   MCH 32.9  26.0 - 34.0 pg   MCHC 34.2  30.0 - 36.0 g/dL   RDW 40.9  81.1 - 91.4 %   Platelets 248  150 - 400 K/uL   Neutrophils Relative 64  43 - 77 %   Neutro Abs 4.7  1.7 - 7.7 K/uL   Lymphocytes Relative 27  12 - 46 %   Lymphs Abs 1.9  0.7 - 4.0 K/uL   Monocytes Relative 7  3 - 12 %   Monocytes Absolute 0.5  0.1 - 1.0 K/uL   Eosinophils Relative 2  0 - 5 %   Eosinophils Absolute 0.2  0.0 - 0.7 K/uL   Basophils Relative 0  0 - 1 %   Basophils Absolute 0.0  0.0 - 0.1 K/uL  BASIC METABOLIC PANEL      Component Value Range   Sodium 137  135 - 145 mEq/L   Potassium 4.1  3.5 - 5.1 mEq/L   Chloride 98  96 - 112 mEq/L   CO2 29  19 - 32 mEq/L   Glucose, Bld 152 (*) 70 - 99 mg/dL   BUN 31 (*) 6 - 23 mg/dL   Creatinine, Ser 1.47 (*) 0.50 - 1.10 mg/dL   Calcium 9.7  8.4 - 82.9 mg/dL   GFR calc non Af Amer 39 (*) >90 mL/min   GFR calc Af Amer 45 (*) >90 mL/min  TROPONIN I      Component Value Range   Troponin I <0.30  <0.30 ng/mL  HEPATIC FUNCTION PANEL      Component Value Range   Total Protein 7.7  6.0 - 8.3 g/dL   Albumin 3.9  3.5 - 5.2 g/dL   AST 23  0 - 37 U/L   ALT 19  0 - 35 U/L   Alkaline Phosphatase 99  39 - 117 U/L   Total Bilirubin 0.3  0.3 - 1.2 mg/dL   Bilirubin, Direct PENDING  0.0 - 0.3 mg/dL   Indirect Bilirubin PENDING  0.3 - 0.9 mg/dL  LIPASE, BLOOD      Component Value Range   Lipase 30  11 - 59 U/L    No results found.   No diagnosis found.   Date: 10/10/2012  [id number]  Rhythm: normal sinus rhythm  QRS Axis: normal  Intervals: normal  ST/T Wave abnormalities: nonspecific ST changes  Conduction Disutrbances:none  Narrative Interpretation:   Old EKG Reviewed: none available    MDM   The chart was scribed for me under my direct supervision.  I  personally performed the history, physical, and medical decision making and all procedures in the evaluation of this patient.Benny Lennert, MD 10/10/12 [id number]  Benny Lennert, MD 10/13/12 [phone]

## 2012-10-09 NOTE — ED Notes (Signed)
Patient complaining of abdominal pain that started tonight. Reports history of triple A. Patient also complained of being clammy and sweating. Patient reports, "I just don't feel good."

## 2012-10-10 ENCOUNTER — Encounter (HOSPITAL_COMMUNITY): Payer: Self-pay | Admitting: Internal Medicine

## 2012-10-10 DIAGNOSIS — J449 Chronic obstructive pulmonary disease, unspecified: Secondary | ICD-10-CM

## 2012-10-10 DIAGNOSIS — R079 Chest pain, unspecified: Secondary | ICD-10-CM

## 2012-10-10 DIAGNOSIS — J189 Pneumonia, unspecified organism: Secondary | ICD-10-CM | POA: Diagnosis present

## 2012-10-10 LAB — CBC
MCV: 95.3 fL (ref 78.0–100.0)
Platelets: 213 10*3/uL (ref 150–400)
RDW: 13.3 % (ref 11.5–15.5)
WBC: 5.8 10*3/uL (ref 4.0–10.5)

## 2012-10-10 LAB — COMPREHENSIVE METABOLIC PANEL
Alkaline Phosphatase: 68 U/L (ref 39–117)
BUN: 24 mg/dL — ABNORMAL HIGH (ref 6–23)
CO2: 26 mEq/L (ref 19–32)
Chloride: 103 mEq/L (ref 96–112)
GFR calc Af Amer: 64 mL/min — ABNORMAL LOW (ref >90)
Glucose, Bld: 108 mg/dL — ABNORMAL HIGH (ref 70–99)
Potassium: 3.8 mEq/L (ref 3.5–5.1)
Total Bilirubin: 0.3 mg/dL (ref 0.3–1.2)

## 2012-10-10 MED ORDER — BUDESONIDE-FORMOTEROL FUMARATE 160-4.5 MCG/ACT IN AERO
2.0000 | INHALATION_SPRAY | Freq: Two times a day (BID) | RESPIRATORY_TRACT | Status: DC
Start: 2012-10-10 — End: 2012-10-12
  Administered 2012-10-10 – 2012-10-12 (×5): 2 via RESPIRATORY_TRACT
  Filled 2012-10-10: qty 6

## 2012-10-10 MED ORDER — SODIUM CHLORIDE 0.9 % IJ SOLN
3.0000 mL | Freq: Two times a day (BID) | INTRAMUSCULAR | Status: DC
  Administered 2012-10-10 – 2012-10-12 (×5): 3 mL via INTRAVENOUS
  Filled 2012-10-10 (×5): qty 3

## 2012-10-10 MED ORDER — HYDROCODONE-ACETAMINOPHEN 10-325 MG PO TABS: 1.0000 | ORAL_TABLET | Freq: Four times a day (QID) | ORAL | Status: DC | PRN

## 2012-10-10 MED ORDER — CITALOPRAM HYDROBROMIDE 20 MG PO TABS
20.0000 mg | ORAL_TABLET | Freq: Every day | ORAL | Status: DC
  Administered 2012-10-10 – 2012-10-11 (×3): 20 mg via ORAL
  Filled 2012-10-10 (×3): qty 1

## 2012-10-10 MED ORDER — FUROSEMIDE 40 MG PO TABS
40.0000 mg | ORAL_TABLET | Freq: Every day | ORAL | Status: DC
  Administered 2012-10-11 – 2012-10-12 (×2): 40 mg via ORAL
  Filled 2012-10-10 (×2): qty 1

## 2012-10-10 MED ORDER — INFLUENZA VIRUS VACC SPLIT PF IM SUSP
0.5000 mL | INTRAMUSCULAR | Status: AC
  Administered 2012-10-11: 0.5 mL via INTRAMUSCULAR
  Filled 2012-10-10: qty 0.5

## 2012-10-10 MED ORDER — DEXTROSE 5 % IV SOLN
1.0000 g | INTRAVENOUS | Status: DC
  Administered 2012-10-10 – 2012-10-12 (×3): 1 g via INTRAVENOUS
  Filled 2012-10-10 (×4): qty 10

## 2012-10-10 MED ORDER — DIAZEPAM 5 MG PO TABS
5.0000 mg | ORAL_TABLET | Freq: Two times a day (BID) | ORAL | Status: DC
  Administered 2012-10-10 – 2012-10-12 (×6): 5 mg via ORAL
  Filled 2012-10-10 (×6): qty 1

## 2012-10-10 MED ORDER — HEPARIN SODIUM (PORCINE) 5000 UNIT/ML IJ SOLN
5000.0000 [IU] | Freq: Three times a day (TID) | INTRAMUSCULAR | Status: DC
  Administered 2012-10-10 – 2012-10-12 (×7): 5000 [IU] via SUBCUTANEOUS
  Filled 2012-10-10 (×7): qty 1

## 2012-10-10 MED ORDER — DEXTROSE 5 % IV SOLN
500.0000 mg | INTRAVENOUS | Status: DC
  Administered 2012-10-11 – 2012-10-12 (×2): 500 mg via INTRAVENOUS
  Filled 2012-10-10 (×3): qty 500

## 2012-10-10 MED ORDER — GABAPENTIN 100 MG PO CAPS
100.0000 mg | ORAL_CAPSULE | Freq: Three times a day (TID) | ORAL | Status: DC
  Administered 2012-10-10 – 2012-10-12 (×7): 100 mg via ORAL
  Filled 2012-10-10 (×16): qty 1

## 2012-10-10 MED ORDER — ACETAMINOPHEN 325 MG PO TABS: 650.0000 mg | ORAL_TABLET | Freq: Four times a day (QID) | ORAL | Status: DC | PRN

## 2012-10-10 MED ORDER — DEXTROSE 5 % IV SOLN
500.0000 mg | Freq: Once | INTRAVENOUS | Status: AC
  Administered 2012-10-10: 500 mg via INTRAVENOUS
  Filled 2012-10-10: qty 500

## 2012-10-10 MED ORDER — ONDANSETRON HCL 4 MG/2ML IJ SOLN: 4.0000 mg | Freq: Four times a day (QID) | INTRAMUSCULAR | Status: DC | PRN

## 2012-10-10 MED ORDER — DEXTROSE 5 % IV SOLN
1.0000 g | Freq: Once | INTRAVENOUS | Status: DC
  Filled 2012-10-10: qty 10

## 2012-10-10 MED ORDER — ASPIRIN EC 81 MG PO TBEC
81.0000 mg | DELAYED_RELEASE_TABLET | Freq: Every day | ORAL | Status: DC
  Administered 2012-10-10 – 2012-10-12 (×3): 81 mg via ORAL
  Filled 2012-10-10 (×3): qty 1

## 2012-10-10 MED ORDER — ACETAMINOPHEN 650 MG RE SUPP: 650.0000 mg | Freq: Four times a day (QID) | RECTAL | Status: DC | PRN

## 2012-10-10 MED ORDER — ALBUTEROL SULFATE (5 MG/ML) 0.5% IN NEBU: 2.5000 mg | INHALATION_SOLUTION | RESPIRATORY_TRACT | Status: DC | PRN

## 2012-10-10 MED ORDER — SODIUM CHLORIDE 0.9 % IV SOLN
INTRAVENOUS | Status: AC
  Administered 2012-10-10 (×2): via INTRAVENOUS

## 2012-10-10 MED ORDER — ONDANSETRON HCL 4 MG PO TABS: 4.0000 mg | ORAL_TABLET | Freq: Four times a day (QID) | ORAL | Status: DC | PRN

## 2012-10-10 MED ORDER — TIOTROPIUM BROMIDE MONOHYDRATE 18 MCG IN CAPS
18.0000 ug | ORAL_CAPSULE | Freq: Every day | RESPIRATORY_TRACT | Status: DC
Start: 2012-10-10 — End: 2012-10-12
  Administered 2012-10-10 – 2012-10-12 (×3): 18 ug via RESPIRATORY_TRACT
  Filled 2012-10-10: qty 5

## 2012-10-10 NOTE — H&P (Signed)
Melanie Lowe is an 78 y.o. female.    PCP: Fredirick Maudlin, MD   Chief Complaint: Left-sided chest pain  HPI: This is a 78 year old, Caucasian female, with a past medical history of COPD, who wears oxygen at all times. She was in her usual state of health till Monday when she started noticing that she was having sweating episodes along with left-sided chest pain. Pain would come and go. She had another episode last night and, so, she decided to come in to the hospital. It was a sharp pain located under her left breast. Patient could not quantify the pain. There was no radiation of the pain. It would not increase with deep breaths. She denies any nausea, vomiting, or cough. Denies any fever or chills. She did have some dizziness without any syncopal episodes. She's not feeling any dizziness currently. She denies any leg swelling. No sick contacts. Denies recent hospitalization. Denies any shortness of breath that is more than usual.   Home Medications: Prior to Admission medications   Medication Sig Start Date End Date Taking? Authorizing Provider  albuterol (PROVENTIL) (2.5 MG/3ML) 0.083% nebulizer solution Take 2.5 mg by nebulization at bedtime. For shortness of breath   Yes Historical Provider, MD  aspirin EC 81 MG tablet Take 81 mg by mouth daily.   Yes Historical Provider, MD  budesonide-formoterol (SYMBICORT) 160-4.5 MCG/ACT inhaler Inhale 2 puffs into the lungs 2 (two) times daily.     Yes Historical Provider, MD  citalopram (CELEXA) 20 MG tablet Take 20 mg by mouth at bedtime.    Yes Historical Provider, MD  diazepam (VALIUM) 5 MG tablet Take 5 mg by mouth 2 (two) times daily.  04/14/12  Yes Historical Provider, MD  furosemide (LASIX) 40 MG tablet Take 40 mg by mouth daily.   Yes Historical Provider, MD  gabapentin (NEURONTIN) 100 MG tablet Take 100 mg by mouth 5 (five) times daily.    Yes Historical Provider, MD  HYDROcodone-acetaminophen (NORCO) 10-325 MG per tablet Take 1 tablet by  mouth every 6 (six) hours as needed. For pain   Yes Historical Provider, MD  tiotropium (SPIRIVA) 18 MCG inhalation capsule Place 18 mcg into inhaler and inhale daily.    Yes Historical Provider, MD  torsemide (DEMADEX) 20 MG tablet Take 20 mg by mouth every morning.    Yes Historical Provider, MD    Allergies:  Allergies  Allergen Reactions  . Other     FOOD: Spice Triggers acid reflux  . Tomato     Triggers acid reflux    Past Medical History: Past Medical History  Diagnosis Date  . COPD (chronic obstructive pulmonary disease)   . Hypertension   . Anxiety   . Depression   . Arthritis   . AAA (abdominal aortic aneurysm)   . Neuropathy   . Leg swelling   . Oxygen dependent   . Myocardial infarction     Past Surgical History  Procedure Date  . Cholecystectomy   . Appendectomy     Social History:  reports that she quit smoking about 21 months ago. Her smoking use included Cigarettes. She quit after 50 years of use. She quit smokeless tobacco use about 21 months ago. She reports that she does not drink alcohol or use illicit drugs.  Family History:  Family History  Problem Relation Age of Onset  . Diabetes Brother   . Cancer Brother     Throat cancer, Heart attack  . Heart disease Brother   .  Heart attack Brother   . Cancer Father   . Cancer Sister     Review of Systems - History obtained from the patient General ROS: positive for  - fatigue Psychological ROS: negative Ophthalmic ROS: negative ENT ROS: negative Allergy and Immunology ROS: negative Hematological and Lymphatic ROS: negative Endocrine ROS: negative Respiratory ROS: no cough, shortness of breath, or wheezing Cardiovascular ROS: as in hpi Gastrointestinal ROS: no abdominal pain, change in bowel habits, or black or bloody stools Genito-Urinary ROS: no dysuria, trouble voiding, or hematuria Musculoskeletal ROS: negative Neurological ROS: no TIA or stroke symptoms Dermatological ROS:  negative  Physical Examination Blood pressure 112/73, pulse 73, temperature 98 F (36.7 C), temperature source Oral, resp. rate 22, height 5\' 2"  (1.575 m), weight 68.04 kg (150 lb), SpO2 94.00%.  General appearance: alert, cooperative, appears stated age and no distress Head: Normocephalic, without obvious abnormality, atraumatic Eyes: conjunctivae/corneas clear. PERRL, EOM's intact.  Throat: lips, mucosa, and tongue normal; teeth and gums normal Neck: no adenopathy, no carotid bruit, no JVD, supple, symmetrical, trachea midline and thyroid not enlarged, symmetric, no tenderness/mass/nodules Back: symmetric, no curvature. ROM normal. No CVA tenderness. Resp: She has crackles bilateral bases, left more than right. No wheezing. Cardio: regular rate and rhythm, S1, S2 normal, no murmur, click, rub or gallop GI: soft, non-tender; bowel sounds normal; no masses,  no organomegaly Extremities: extremities normal, atraumatic, no cyanosis or edema Pulses: 2+ and symmetric Skin: Skin color, texture, turgor normal. No rashes or lesions Lymph nodes: Cervical, supraclavicular, and axillary nodes normal. Neurologic: She is alert and oriented x3. No focal neurological deficits are present.  Laboratory Data: Results for orders placed during the hospital encounter of 10/09/12 (from the past 48 hour(s))  CBC WITH DIFFERENTIAL     Status: Normal   Collection Time   10/09/12  9:43 PM      Component Value Range Comment   WBC 7.3  4.0 - 10.5 K/uL    RBC 4.50  3.87 - 5.11 MIL/uL    Hemoglobin 14.8  12.0 - 15.0 g/dL    HCT 82.9  56.2 - 13.0 %    MCV 96.2  78.0 - 100.0 fL    MCH 32.9  26.0 - 34.0 pg    MCHC 34.2  30.0 - 36.0 g/dL    RDW 86.5  78.4 - 69.6 %    Platelets 248  150 - 400 K/uL    Neutrophils Relative 64  43 - 77 %    Neutro Abs 4.7  1.7 - 7.7 K/uL    Lymphocytes Relative 27  12 - 46 %    Lymphs Abs 1.9  0.7 - 4.0 K/uL    Monocytes Relative 7  3 - 12 %    Monocytes Absolute 0.5  0.1 - 1.0  K/uL    Eosinophils Relative 2  0 - 5 %    Eosinophils Absolute 0.2  0.0 - 0.7 K/uL    Basophils Relative 0  0 - 1 %    Basophils Absolute 0.0  0.0 - 0.1 K/uL   BASIC METABOLIC PANEL     Status: Abnormal   Collection Time   10/09/12  9:43 PM      Component Value Range Comment   Sodium 137  135 - 145 mEq/L    Potassium 4.1  3.5 - 5.1 mEq/L    Chloride 98  96 - 112 mEq/L    CO2 29  19 - 32 mEq/L    Glucose, Bld 152 (*)  70 - 99 mg/dL    BUN 31 (*) 6 - 23 mg/dL    Creatinine, Ser 1.61 (*) 0.50 - 1.10 mg/dL    Calcium 9.7  8.4 - 09.6 mg/dL    GFR calc non Af Amer 39 (*) >90 mL/min    GFR calc Af Amer 45 (*) >90 mL/min   TROPONIN I     Status: Normal   Collection Time   10/09/12 11:14 PM      Component Value Range Comment   Troponin I <0.30  <0.30 ng/mL   HEPATIC FUNCTION PANEL     Status: Normal   Collection Time   10/09/12 11:14 PM      Component Value Range Comment   Total Protein 7.7  6.0 - 8.3 g/dL    Albumin 3.9  3.5 - 5.2 g/dL    AST 23  0 - 37 U/L    ALT 19  0 - 35 U/L    Alkaline Phosphatase 99  39 - 117 U/L    Total Bilirubin 0.3  0.3 - 1.2 mg/dL    Bilirubin, Direct <0.4  0.0 - 0.3 mg/dL    Indirect Bilirubin NOT CALCULATED  0.3 - 0.9 mg/dL   LIPASE, BLOOD     Status: Normal   Collection Time   10/09/12 11:14 PM      Component Value Range Comment   Lipase 30  11 - 59 U/L     Radiology Reports: Dg Chest 2 View  10/09/2012  *RADIOLOGY REPORT*  Clinical Data: 78 year old female weakness and pain.  CHEST - 2 VIEW  Comparison: 10/31/2011.  Findings: Stable scoliosis and cardiomegaly.  Stable mediastinal contours.  Chronic pulmonary hyperinflation.  Increased lower lobe peribronchovascular opacity on the lateral.  Probably this is in the left lower lobe.  Other chronic basilar curvilinear markings appear stable.  No pneumothorax or edema.  No pleural effusion. Osteopenia. No acute osseous abnormality identified.  IMPRESSION: Left lower lobe bronchopneumonia.  Underlying  chronic lung disease and cardiomegaly.   Original Report Authenticated By: Harley Hallmark, M.D.     Electrocardiogram: Sinus rhythm at 77 beats per minute. Normal axis. First degree AV block is present. No Q waves. No concerning ST or T-wave changes are noted.  Assessment/Plan  Principal Problem:  *CAP (community acquired pneumonia) Active Problems:  COPD  Thoracic aneurysm without mention of rupture  Chest pain   #1 left-sided chest pain: Most likely related to the pneumonia found on the chest x-ray. We will cycle cardiac enzymes. The pain is somewhat atypical for coronary artery disease. Continue with aspirin.  #2 community acquired pneumonia will be treated with ceftriaxone and azithromycin.  #3 history of COPD, on home oxygen: Continue with nebulizer treatments as needed. Continue with inhaled steroids.  #4 mild dehydration: Will give her gentle IV hydration.  #5 history of thoracic aneurysm: According to the patient she is not a candidate for surgery.  DVT, prophylaxis with heparin.  Further management decisions will depend on results of further testing and patient's response to treatment.  Dr. Juanetta Gosling will assume care in the morning.   [facility]  [facility] Pager [phone]  10/10/2012, 12:59 AM

## 2012-10-10 NOTE — Progress Notes (Signed)
Occupational Therapy Screen  OT orders received. Patient's chart reviewed. Reviewed PT eval stating that patient has no need for PT services and is functioning independently. Spoke with patient to determine if there was a need for OT services. Patient states that she does not need any assistance with daily tasks. She lives with her son who is available to assist if she may need it. Patient does not require OT services at this time; will sign off.  Limmie Patricia, OTR/L 10/10/12 2:18PM

## 2012-10-10 NOTE — Care Management Note (Unsigned)
    Page 1 of 1   10/10/2012     3:32:51 PM   CARE MANAGEMENT NOTE 10/10/2012  Patient:  Melanie Lowe, Melanie Lowe   Account Number:  [id number]  Date Initiated:  10/10/2012  Documentation initiated by:  Sharrie Rothman  Subjective/Objective Assessment:   Pt admitted from home with CAP. Pt lives with her son and is independent with ADL's. Pt will return home at discharge.     Action/Plan:   No CM or HH needs noted.   Anticipated DC Date:  10/13/2012   Anticipated DC Plan:  HOME/SELF CARE      DC Planning Services  CM consult      Choice offered to / List presented to:             Status of service:  Completed, signed off Medicare Important Message given?  YES (If response is "NO", the following Medicare IM given date fields will be blank) Date Medicare IM given:  10/10/2012 Date Additional Medicare IM given:    Discharge Disposition:    Per UR Regulation:    If discussed at Long Length of Stay Meetings, dates discussed:    Comments:  10/10/12 1532 Arlyss Queen, RN BSN CM

## 2012-10-10 NOTE — Progress Notes (Signed)
UR Chart Review Completed  

## 2012-10-10 NOTE — Clinical Documentation Improvement (Signed)
RESPIRATORY FAILURE DOCUMENTATION CLARIFICATION QUERY   THIS DOCUMENT IS NOT A PERMANENT PART OF THE MEDICAL RECORD  TO RESPOND TO THE THIS QUERY, FOLLOW THE INSTRUCTIONS BELOW:  1. If needed, update documentation for the patient's encounter via the notes activity.  2. Access this query again and click edit on the In [employer].  3. After updating, or not, click F2 to complete all highlighted (required) fields concerning your review. Select "additional documentation in the medical record" OR "no additional documentation provided".  4. Click Sign note button.  5. The deficiency will fall out of your In Basket *Please let us know if you are not able to complete this workflow by phone or e-mail (listed below).  Please update your documentation within the medical record to reflect your response to this query.                                                                                    10/10/12  Dear Dr. Juanetta Gosling Marton Redwood,  In a better effort to capture your patient's severity of illness, reflect appropriate length of stay and utilization of resources, a review of the patient medical record has revealed the following indicators.  Based on your clinical judgment, please clarify and document in a progress note and/or discharge summary the clinical condition associated with the following supporting information: In responding to this query please exercise your independent judgment.  The fact that a query is asked, does not imply that any particular answer is desired or expected.  Please clarify respiratory status:  Possible Clinical Conditions?  Acute Respiratory Failure Acute on Chronic Respiratory Failure Chronic Respiratory Failure Other Condition________________ Cannot Clinically Determine   Clinical Information:  Risk Factors: History of severe COPD on chronic home oxygen  Diagnosis of pneumonia  Treatment: Continuous Oxygen therapy via [place]. Keep O2 sats >92%. Respiratory  Treatment: Proventil Neb 2.5mg  q4h prn Symbicort 160-4.5mcg/act inhaler 2 puffs bid Spirva inhaler qd  You may use possible, probable, or suspect with inpatient documentation. possible, probable, suspected diagnoses MUST be documented at the time of discharge  Reviewed: additional documentation in the medical record  Thank You,  Debora T Williams RN, MSN Clinical Documentation Specialist: Office# [phone] [facility] Health Information Management [place]

## 2012-10-10 NOTE — Progress Notes (Signed)
Subjective: She was admitted early this morning with pneumonia. Interestingly she does not complain of cough but mostly complains of left pleuritic pain. Her pneumonia is in the left lower lobe.  Objective: Vital signs in last 24 hours: Temp:  [97.9 F (36.6 C)-98 F (36.7 C)] 97.9 F (36.6 C) (10/11 0512) Pulse Rate:  [57-79] 57  (10/11 0512) Resp:  [19-22] 19  (10/11 0512) BP: (109-126)/(57-86) 109/57 mmHg (10/11 0512) SpO2:  [89 %-98 %] 98 % (10/11 0512) FiO2 (%):  [28 %] 28 % (10/11 0121) Weight:  [68.04 kg (150 lb)-69.582 kg (153 lb 6.4 oz)] 69.582 kg (153 lb 6.4 oz) (10/11 0128) Weight change:  Last BM Date: 10/09/12  Intake/Output from previous day: 10/10 0701 - 10/11 0700 In: 639.3 [I.V.:339.3; IV Piggyback:300] Out: -   PHYSICAL EXAM General appearance: alert, cooperative and mild distress Resp: rhonchi bilaterally Cardio: regular rate and rhythm, S1, S2 normal, no murmur, click, rub or gallop GI: soft, non-tender; bowel sounds normal; no masses,  no organomegaly Extremities: extremities normal, atraumatic, no cyanosis or edema  Lab Results:    Basic Metabolic Panel:  Basename 10/10/12 0520 10/09/12 2143  NA 138 137  K 3.8 4.1  CL 103 98  CO2 26 29  GLUCOSE 108* 152*  BUN 24* 31*  CREATININE 0.91 1.23*  CALCIUM 9.1 9.7  MG -- --  PHOS -- --   Liver Function Tests:  [facility] 10/10/12 0520 10/09/12 2314  AST 18 23  ALT 13 19  ALKPHOS 68 99  BILITOT 0.3 0.3  PROT 6.6 7.7  ALBUMIN 3.3* 3.9    Basename 10/09/12 2314  LIPASE 30  AMYLASE --   No results found for this basename: AMMONIA:2 in the last 72 hours CBC:  Basename 10/10/12 0520 10/09/12 2143  WBC 5.8 7.3  NEUTROABS -- 4.7  HGB 13.4 14.8  HCT 38.9 43.3  MCV 95.3 96.2  PLT 213 248   Cardiac Enzymes:  Basename 10/10/12 0520 10/10/12 0127 10/09/12 2314  CKTOTAL -- -- --  CKMB -- -- --  CKMBINDEX -- -- --  TROPONINI <0.30 <0.30 <0.30   BNP: No results found for this basename:  PROBNP:3 in the last 72 hours D-Dimer: No results found for this basename: DDIMER:2 in the last 72 hours CBG: No results found for this basename: GLUCAP:6 in the last 72 hours Hemoglobin A1C: No results found for this basename: HGBA1C in the last 72 hours Fasting Lipid Panel: No results found for this basename: CHOL,HDL,LDLCALC,TRIG,CHOLHDL,LDLDIRECT in the last 72 hours Thyroid Function Tests: No results found for this basename: TSH,T4TOTAL,FREET4,T3FREE,THYROIDAB in the last 72 hours Anemia Panel: No results found for this basename: VITAMINB12,FOLATE,FERRITIN,TIBC,IRON,RETICCTPCT in the last 72 hours Coagulation: No results found for this basename: LABPROT:2,INR:2 in the last 72 hours Urine Drug Screen: Drugs of Abuse  No results found for this basename: labopia, cocainscrnur, labbenz, amphetmu, thcu, labbarb    Alcohol Level: No results found for this basename: ETH:2 in the last 72 hours Urinalysis: No results found for this basename: COLORURINE:2,APPERANCEUR:2,LABSPEC:2,PHURINE:2,GLUCOSEU:2,HGBUR:2,BILIRUBINUR:2,KETONESUR:2,PROTEINUR:2,UROBILINOGEN:2,NITRITE:2,LEUKOCYTESUR:2 in the last 72 hours Misc. Labs:  ABGS No results found for this basename: PHART,PCO2,PO2ART,TCO2,HCO3 in the last 72 hours CULTURES No results found for this or any previous visit (from the past 240 hour(s)). Studies/Results: Dg Chest 2 View  10/09/2012  *RADIOLOGY REPORT*  Clinical Data: 78 year old female weakness and pain.  CHEST - 2 VIEW  Comparison: 10/31/2011.  Findings: Stable scoliosis and cardiomegaly.  Stable mediastinal contours.  Chronic pulmonary hyperinflation.  Increased lower lobe peribronchovascular  opacity on the lateral.  Probably this is in the left lower lobe.  Other chronic basilar curvilinear markings appear stable.  No pneumothorax or edema.  No pleural effusion. Osteopenia. No acute osseous abnormality identified.  IMPRESSION: Left lower lobe bronchopneumonia.  Underlying chronic lung  disease and cardiomegaly.   Original Report Authenticated By: Harley Hallmark, M.D.     Medications:  Prior to Admission:  Prescriptions prior to admission  Medication Sig Dispense Refill  . albuterol (PROVENTIL) (2.5 MG/3ML) 0.083% nebulizer solution Take 2.5 mg by nebulization at bedtime. For shortness of breath      . aspirin EC 81 MG tablet Take 81 mg by mouth daily.      . budesonide-formoterol (SYMBICORT) 160-4.5 MCG/ACT inhaler Inhale 2 puffs into the lungs 2 (two) times daily.        . citalopram (CELEXA) 20 MG tablet Take 20 mg by mouth at bedtime.       . diazepam (VALIUM) 5 MG tablet Take 5 mg by mouth 2 (two) times daily.       . furosemide (LASIX) 40 MG tablet Take 40 mg by mouth daily.      Marland Kitchen gabapentin (NEURONTIN) 100 MG tablet Take 100 mg by mouth 5 (five) times daily.       Marland Kitchen HYDROcodone-acetaminophen (NORCO) 10-325 MG per tablet Take 1 tablet by mouth every 6 (six) hours as needed. For pain      . tiotropium (SPIRIVA) 18 MCG inhalation capsule Place 18 mcg into inhaler and inhale daily.       Marland Kitchen torsemide (DEMADEX) 20 MG tablet Take 20 mg by mouth every morning.        Scheduled:   . aspirin EC  81 mg Oral Daily  . azithromycin  500 mg Intravenous Once  . azithromycin  500 mg Intravenous Q24H  . budesonide-formoterol  2 puff Inhalation BID  . cefTRIAXone (ROCEPHIN)  IV  1 g Intravenous Q24H  . citalopram  20 mg Oral QHS  . diazepam  5 mg Oral BID  . furosemide  40 mg Oral Daily  . gabapentin  100 mg Oral TID  . heparin  5,000 Units Subcutaneous Q8H  . influenza  inactive virus vaccine  0.5 mL Intramuscular Tomorrow-1000  . sodium chloride  3 mL Intravenous Q12H  . tiotropium  18 mcg Inhalation Daily  . DISCONTD: cefTRIAXone (ROCEPHIN)  IV  1 g Intravenous Once   Continuous:   . sodium chloride 75 mL/hr at 10/10/12 0200   ZOX:[id number], acetaminophen, albuterol, HYDROcodone-acetaminophen, ondansetron (ZOFRAN) IV, ondansetron  Assesment: She has pneumonia.  She has chest pain associated with that. She has COPD which is pretty severe on chronic oxygen. She has a thoracic aortic aneurysm and is not a surgical candidate Principal Problem:  *CAP (community acquired pneumonia) Active Problems:  COPD  Thoracic aneurysm without mention of rupture  Chest pain    Plan: Continue his IV antibiotics etc.    LOS: 1 day   Mackensi Mahadeo L 10/10/2012, 8:10 AM

## 2012-10-10 NOTE — Progress Notes (Signed)
ANTIBIOTIC CONSULT NOTE - INITIAL  Pharmacy Consult to adjust ABX for renal fxn Indication: pneumonia  Allergies  Allergen Reactions  . Other     FOOD: Spice Triggers acid reflux  . Tomato     Triggers acid reflux   Patient Measurements: Height: 5\' 1"  (154.9 cm) Weight: 153 lb 6.4 oz (69.582 kg) IBW/kg (Calculated) : 47.8   Vital Signs: Temp: 97.9 F (36.6 C) (10/11 0512) Temp src: Oral (10/11 0512) BP: 109/57 mmHg (10/11 0512) Pulse Rate: 57  (10/11 0512) Intake/Output from previous day: 10/10 0701 - 10/11 0700 In: 639.3 [I.V.:339.3; IV Piggyback:300] Out: -  Intake/Output from this shift:    Labs:  Basename 10/10/12 0520 10/09/12 2143  WBC 5.8 7.3  HGB 13.4 14.8  PLT 213 248  LABCREA -- --  CREATININE 0.91 1.23*   Estimated Creatinine Clearance: 39.6 ml/min (by C-G formula based on Cr of 0.91). No results found for this basename: VANCOTROUGH:2,VANCOPEAK:2,VANCORANDOM:2,GENTTROUGH:2,GENTPEAK:2,GENTRANDOM:2,TOBRATROUGH:2,TOBRAPEAK:2,TOBRARND:2,AMIKACINPEAK:2,AMIKACINTROU:2,AMIKACIN:2, in the last 72 hours   Microbiology: No results found for this or any previous visit (from the past 720 hour(s)).  Medical History: Past Medical History  Diagnosis Date  . COPD (chronic obstructive pulmonary disease)   . Hypertension   . Anxiety   . Depression   . Arthritis   . AAA (abdominal aortic aneurysm)   . Neuropathy   . Leg swelling   . Oxygen dependent   . Myocardial infarction    Medications:  Scheduled:    . aspirin EC  81 mg Oral Daily  . azithromycin  500 mg Intravenous Once  . azithromycin  500 mg Intravenous Q24H  . budesonide-formoterol  2 puff Inhalation BID  . cefTRIAXone (ROCEPHIN)  IV  1 g Intravenous Q24H  . citalopram  20 mg Oral QHS  . diazepam  5 mg Oral BID  . furosemide  40 mg Oral Daily  . gabapentin  100 mg Oral TID  . heparin  5,000 Units Subcutaneous Q8H  . influenza  inactive virus vaccine  0.5 mL Intramuscular Tomorrow-1000  .  sodium chloride  3 mL Intravenous Q12H  . tiotropium  18 mcg Inhalation Daily  . DISCONTD: cefTRIAXone (ROCEPHIN)  IV  1 g Intravenous Once   Assessment: 78yo female admitted with CAP started on Zithromax and Rocephin.  No adjustment needed for renal fxn.  Goal of Therapy:  Eradicate infection.  Plan: Continue current ABX, no adjustment needed Monitor progress.  Margo Aye, Triston Lisanti A 10/10/2012,7:54 AM

## 2012-10-10 NOTE — Evaluation (Signed)
Physical Therapy Evaluation Patient Details Name: Melanie Lowe MRN: [id number] DOB: [birth year] Today's Date: 10/10/2012 Time: 2956-2130 PT Time Calculation (min): 37 min  PT Assessment / Plan / Recommendation Clinical Impression  Pt was found to have no functional limitations on eval.  No PT needed.    PT Assessment  Patent does not need any further PT services    Follow Up Recommendations  No PT follow up    Does the patient have the potential to tolerate intense rehabilitation      Barriers to Discharge        Equipment Recommendations  None recommended by PT    Recommendations for Other Services     Frequency      Precautions / Restrictions Precautions Precautions: None Restrictions Weight Bearing Restrictions: No   Pertinent Vitals/Pain       Mobility  Bed Mobility Bed Mobility: Supine to Sit;Sit to Supine Supine to Sit: 7: Independent;HOB flat Sit to Supine: 7: Independent;HOB flat Transfers Transfers: Stand to Sit;Sit to Stand Sit to Stand: 7: Independent Stand to Sit: 7: Independent Ambulation/Gait Ambulation/Gait Assistance: 7: Independent Ambulation Distance (Feet): 200 Feet Assistive device: None Gait Pattern: Within Functional Limits General Gait Details: Pt on O2 at 3 L/min Stairs: No Wheelchair Mobility Wheelchair Mobility: No    Shoulder Instructions     Exercises     PT Diagnosis:    PT Problem List:   PT Treatment Interventions:     PT Goals    Visit Information  Last PT Received On: 10/10/12    Subjective Data  Subjective: I haven't gotten mcuh sleep Patient Stated Goal: go home   Prior Functioning  Home Living Lives With: Son Available Help at Discharge: Family;Available 24 hours/day Type of Home: House Home Access: Stairs to enter [employer] of Steps: 2 Entrance Stairs-Rails: Right Home Layout: One level Bathroom Shower/Tub: Engineer, manufacturing systems: Standard Home Adaptive Equipment: Wheelchair  - manual;Walker - rolling;Straight cane;Bedside commode/3-in-1 Prior Function Level of Independence: Independent Able to Take Stairs?: Yes Driving: No Vocation: Retired    IT consultant  Overall Cognitive Status: Appears within functional limits for tasks assessed/performed Arousal/Alertness: Awake/alert Orientation Level: Appears intact for tasks assessed Behavior During Session: [facility] for tasks performed    Extremity/Trunk Assessment Right Lower Extremity Assessment RLE ROM/Strength/Tone: Within functional levels RLE Sensation: WFL - Light Touch RLE Coordination: WFL - gross motor Left Lower Extremity Assessment LLE ROM/Strength/Tone: Within functional levels LLE Sensation: WFL - Light Touch LLE Coordination: WFL - gross motor Trunk Assessment Trunk Assessment: Normal   Balance Balance Balance Assessed:  (WNL by funcitonal observation)  End of Session PT - End of Session Equipment Utilized During Treatment: Gait belt Activity Tolerance: Patient tolerated treatment well Patient left: in bed;with call bell/phone within reach;with bed alarm set Nurse Communication: Mobility status  GP     Konrad Penta 10/10/2012, 9:24 AM

## 2012-10-11 MED ORDER — SODIUM CHLORIDE 0.9 % IJ SOLN
INTRAMUSCULAR | Status: AC
  Administered 2012-10-11: 13:00:00
  Filled 2012-10-11: qty 3

## 2012-10-11 NOTE — Progress Notes (Signed)
Subjective: She says she feels well. She is not short of breath. She has no other new complaints. Her sodium level has dropped  Objective: Vital signs in last 24 hours: Temp:  [97.2 F (36.2 C)-97.7 F (36.5 C)] 97.7 F (36.5 C) (10/12 0554) Pulse Rate:  [58-65] 65  (10/12 0554) Resp:  [20] 20  (10/12 0554) BP: (110-137)/(68-79) 137/79 mmHg (10/12 0554) SpO2:  [94 %-98 %] 97 % (10/12 0729) Weight change:  Last BM Date: 10/09/12  Intake/Output from previous day: 10/11 0701 - 10/12 0700 In: 2962.5 [P.O.:840; I.V.:1822.5; IV Piggyback:300] Out: 2150 [Urine:2150]  PHYSICAL EXAM General appearance: alert and no distress Resp: rhonchi anterior - bilateral Cardio: regular rate and rhythm, S1, S2 normal, no murmur, click, rub or gallop GI: soft, non-tender; bowel sounds normal; no masses,  no organomegaly Extremities: extremities normal, atraumatic, no cyanosis or edema  Lab Results:    Basic Metabolic Panel:  Basename 10/10/12 0520 10/09/12 2143  NA 138 137  K 3.8 4.1  CL 103 98  CO2 26 29  GLUCOSE 108* 152*  BUN 24* 31*  CREATININE 0.91 1.23*  CALCIUM 9.1 9.7  MG -- --  PHOS -- --   Liver Function Tests:  [facility] 10/10/12 0520 10/09/12 2314  AST 18 23  ALT 13 19  ALKPHOS 68 99  BILITOT 0.3 0.3  PROT 6.6 7.7  ALBUMIN 3.3* 3.9    Basename 10/09/12 2314  LIPASE 30  AMYLASE --   No results found for this basename: AMMONIA:2 in the last 72 hours CBC:  Basename 10/10/12 0520 10/09/12 2143  WBC 5.8 7.3  NEUTROABS -- 4.7  HGB 13.4 14.8  HCT 38.9 43.3  MCV 95.3 96.2  PLT 213 248   Cardiac Enzymes:  Basename 10/10/12 1336 10/10/12 0520 10/10/12 0127  CKTOTAL -- -- --  CKMB -- -- --  CKMBINDEX -- -- --  TROPONINI <0.30 <0.30 <0.30   BNP: No results found for this basename: PROBNP:3 in the last 72 hours D-Dimer: No results found for this basename: DDIMER:2 in the last 72 hours CBG: No results found for this basename: GLUCAP:6 in the last 72  hours Hemoglobin A1C: No results found for this basename: HGBA1C in the last 72 hours Fasting Lipid Panel: No results found for this basename: CHOL,HDL,LDLCALC,TRIG,CHOLHDL,LDLDIRECT in the last 72 hours Thyroid Function Tests: No results found for this basename: TSH,T4TOTAL,FREET4,T3FREE,THYROIDAB in the last 72 hours Anemia Panel: No results found for this basename: VITAMINB12,FOLATE,FERRITIN,TIBC,IRON,RETICCTPCT in the last 72 hours Coagulation: No results found for this basename: LABPROT:2,INR:2 in the last 72 hours Urine Drug Screen: Drugs of Abuse  No results found for this basename: labopia, cocainscrnur, labbenz, amphetmu, thcu, labbarb    Alcohol Level: No results found for this basename: ETH:2 in the last 72 hours Urinalysis: No results found for this basename: COLORURINE:2,APPERANCEUR:2,LABSPEC:2,PHURINE:2,GLUCOSEU:2,HGBUR:2,BILIRUBINUR:2,KETONESUR:2,PROTEINUR:2,UROBILINOGEN:2,NITRITE:2,LEUKOCYTESUR:2 in the last 72 hours Misc. Labs:  ABGS No results found for this basename: PHART,PCO2,PO2ART,TCO2,HCO3 in the last 72 hours CULTURES No results found for this or any previous visit (from the past 240 hour(s)). Studies/Results: Dg Chest 2 View  10/09/2012  *RADIOLOGY REPORT*  Clinical Data: 78 year old female weakness and pain.  CHEST - 2 VIEW  Comparison: 10/31/2011.  Findings: Stable scoliosis and cardiomegaly.  Stable mediastinal contours.  Chronic pulmonary hyperinflation.  Increased lower lobe peribronchovascular opacity on the lateral.  Probably this is in the left lower lobe.  Other chronic basilar curvilinear markings appear stable.  No pneumothorax or edema.  No pleural effusion. Osteopenia. No acute osseous  abnormality identified.  IMPRESSION: Left lower lobe bronchopneumonia.  Underlying chronic lung disease and cardiomegaly.   Original Report Authenticated By: Harley Hallmark, M.D.     Medications:  Prior to Admission:  Prescriptions prior to admission  Medication  Sig Dispense Refill  . albuterol (PROVENTIL) (2.5 MG/3ML) 0.083% nebulizer solution Take 2.5 mg by nebulization at bedtime. For shortness of breath      . aspirin EC 81 MG tablet Take 81 mg by mouth daily.      . budesonide-formoterol (SYMBICORT) 160-4.5 MCG/ACT inhaler Inhale 2 puffs into the lungs 2 (two) times daily.        . citalopram (CELEXA) 20 MG tablet Take 20 mg by mouth at bedtime.       . diazepam (VALIUM) 5 MG tablet Take 5 mg by mouth 2 (two) times daily.       . furosemide (LASIX) 40 MG tablet Take 40 mg by mouth daily.      Marland Kitchen gabapentin (NEURONTIN) 100 MG tablet Take 100 mg by mouth 5 (five) times daily.       Marland Kitchen HYDROcodone-acetaminophen (NORCO) 10-325 MG per tablet Take 1 tablet by mouth every 6 (six) hours as needed. For pain      . tiotropium (SPIRIVA) 18 MCG inhalation capsule Place 18 mcg into inhaler and inhale daily.       Marland Kitchen torsemide (DEMADEX) 20 MG tablet Take 20 mg by mouth every morning.        Scheduled:   . aspirin EC  81 mg Oral Daily  . azithromycin  500 mg Intravenous Q24H  . budesonide-formoterol  2 puff Inhalation BID  . cefTRIAXone (ROCEPHIN)  IV  1 g Intravenous Q24H  . citalopram  20 mg Oral QHS  . diazepam  5 mg Oral BID  . furosemide  40 mg Oral Daily  . gabapentin  100 mg Oral TID  . heparin  5,000 Units Subcutaneous Q8H  . influenza  inactive virus vaccine  0.5 mL Intramuscular Tomorrow-1000  . sodium chloride  3 mL Intravenous Q12H  . tiotropium  18 mcg Inhalation Daily   Continuous:   . sodium chloride 75 mL/hr at 10/10/12 1326   WUJ:[id number], acetaminophen, albuterol, HYDROcodone-acetaminophen, ondansetron (ZOFRAN) IV, ondansetron  Assesment: She has pneumonia and severe COPD but he is doing amazingly well Principal Problem:  *CAP (community acquired pneumonia) Active Problems:  COPD  Thoracic aneurysm without mention of rupture  Chest pain    Plan: Continue treatments I think she might be able to go home tomorrow    LOS:  2 days   Adriel Desrosier L 10/11/2012, 8:38 AM

## 2012-10-12 DIAGNOSIS — J9620 Acute and chronic respiratory failure, unspecified whether with hypoxia or hypercapnia: Secondary | ICD-10-CM | POA: Diagnosis present

## 2012-10-12 LAB — LEGIONELLA ANTIGEN, URINE: Legionella Antigen, Urine: NEGATIVE

## 2012-10-12 MED ORDER — AZITHROMYCIN 250 MG PO TABS: ORAL_TABLET | ORAL | Status: AC

## 2012-10-12 MED ORDER — CEFUROXIME AXETIL 250 MG PO TABS: 250.0000 mg | ORAL_TABLET | Freq: Two times a day (BID) | ORAL | Status: AC

## 2012-10-12 MED ORDER — METHYLPREDNISOLONE 4 MG PO KIT: PACK | ORAL | Status: DC

## 2012-10-12 NOTE — Progress Notes (Signed)
Subjective: She has been ambulating in the hall without difficulty. She says she feels well. She has no new complaints.  Objective: Vital signs in last 24 hours: Temp:  [97.1 F (36.2 C)-98 F (36.7 C)] 97.5 F (36.4 C) (10/13 0507) Pulse Rate:  [55-65] 65  (10/13 0507) Resp:  [20-22] 20  (10/13 0507) BP: (91-115)/(58-66) 115/66 mmHg (10/13 0507) SpO2:  [93 %-95 %] 93 % (10/13 0812) Weight change:  Last BM Date: 10/10/12  Intake/Output from previous day: 10/12 0701 - 10/13 0700 In: 1860 [P.O.:1560; IV Piggyback:300] Out: 1050 [Urine:1050]  PHYSICAL EXAM General appearance: alert, cooperative and no distress Resp: rhonchi bilaterally Cardio: regular rate and rhythm, S1, S2 normal, no murmur, click, rub or gallop GI: soft, non-tender; bowel sounds normal; no masses,  no organomegaly Extremities: extremities normal, atraumatic, no cyanosis or edema  Lab Results:    Basic Metabolic Panel:  Basename 10/10/12 0520 10/09/12 2143  NA 138 137  K 3.8 4.1  CL 103 98  CO2 26 29  GLUCOSE 108* 152*  BUN 24* 31*  CREATININE 0.91 1.23*  CALCIUM 9.1 9.7  MG -- --  PHOS -- --   Liver Function Tests:  [facility] 10/10/12 0520 10/09/12 2314  AST 18 23  ALT 13 19  ALKPHOS 68 99  BILITOT 0.3 0.3  PROT 6.6 7.7  ALBUMIN 3.3* 3.9    Basename 10/09/12 2314  LIPASE 30  AMYLASE --   No results found for this basename: AMMONIA:2 in the last 72 hours CBC:  Basename 10/10/12 0520 10/09/12 2143  WBC 5.8 7.3  NEUTROABS -- 4.7  HGB 13.4 14.8  HCT 38.9 43.3  MCV 95.3 96.2  PLT 213 248   Cardiac Enzymes:  Basename 10/10/12 1336 10/10/12 0520 10/10/12 0127  CKTOTAL -- -- --  CKMB -- -- --  CKMBINDEX -- -- --  TROPONINI <0.30 <0.30 <0.30   BNP: No results found for this basename: PROBNP:3 in the last 72 hours D-Dimer: No results found for this basename: DDIMER:2 in the last 72 hours CBG: No results found for this basename: GLUCAP:6 in the last 72 hours Hemoglobin  A1C: No results found for this basename: HGBA1C in the last 72 hours Fasting Lipid Panel: No results found for this basename: CHOL,HDL,LDLCALC,TRIG,CHOLHDL,LDLDIRECT in the last 72 hours Thyroid Function Tests: No results found for this basename: TSH,T4TOTAL,FREET4,T3FREE,THYROIDAB in the last 72 hours Anemia Panel: No results found for this basename: VITAMINB12,FOLATE,FERRITIN,TIBC,IRON,RETICCTPCT in the last 72 hours Coagulation: No results found for this basename: LABPROT:2,INR:2 in the last 72 hours Urine Drug Screen: Drugs of Abuse  No results found for this basename: labopia, cocainscrnur, labbenz, amphetmu, thcu, labbarb    Alcohol Level: No results found for this basename: ETH:2 in the last 72 hours Urinalysis: No results found for this basename: COLORURINE:2,APPERANCEUR:2,LABSPEC:2,PHURINE:2,GLUCOSEU:2,HGBUR:2,BILIRUBINUR:2,KETONESUR:2,PROTEINUR:2,UROBILINOGEN:2,NITRITE:2,LEUKOCYTESUR:2 in the last 72 hours Misc. Labs:  ABGS No results found for this basename: PHART,PCO2,PO2ART,TCO2,HCO3 in the last 72 hours CULTURES No results found for this or any previous visit (from the past 240 hour(s)). Studies/Results: No results found.  Medications:  Prior to Admission:  Prescriptions prior to admission  Medication Sig Dispense Refill  . albuterol (PROVENTIL) (2.5 MG/3ML) 0.083% nebulizer solution Take 2.5 mg by nebulization at bedtime. For shortness of breath      . aspirin EC 81 MG tablet Take 81 mg by mouth daily.      . budesonide-formoterol (SYMBICORT) 160-4.5 MCG/ACT inhaler Inhale 2 puffs into the lungs 2 (two) times daily.        Marland Kitchen  citalopram (CELEXA) 20 MG tablet Take 20 mg by mouth at bedtime.       . diazepam (VALIUM) 5 MG tablet Take 5 mg by mouth 2 (two) times daily.       . furosemide (LASIX) 40 MG tablet Take 40 mg by mouth daily.      Marland Kitchen gabapentin (NEURONTIN) 100 MG tablet Take 100 mg by mouth 5 (five) times daily.       Marland Kitchen HYDROcodone-acetaminophen (NORCO) 10-325  MG per tablet Take 1 tablet by mouth every 6 (six) hours as needed. For pain      . tiotropium (SPIRIVA) 18 MCG inhalation capsule Place 18 mcg into inhaler and inhale daily.       Marland Kitchen torsemide (DEMADEX) 20 MG tablet Take 20 mg by mouth every morning.        Scheduled:   . aspirin EC  81 mg Oral Daily  . azithromycin  500 mg Intravenous Q24H  . budesonide-formoterol  2 puff Inhalation BID  . cefTRIAXone (ROCEPHIN)  IV  1 g Intravenous Q24H  . citalopram  20 mg Oral QHS  . diazepam  5 mg Oral BID  . furosemide  40 mg Oral Daily  . gabapentin  100 mg Oral TID  . heparin  5,000 Units Subcutaneous Q8H  . influenza  inactive virus vaccine  0.5 mL Intramuscular Tomorrow-1000  . sodium chloride  3 mL Intravenous Q12H  . sodium chloride      . tiotropium  18 mcg Inhalation Daily   Continuous:  OZH:[id number], acetaminophen, albuterol, HYDROcodone-acetaminophen, ondansetron (ZOFRAN) IV, ondansetron  Assesment: She was admitted with community-acquired pneumonia and is doing much better. She is ready for discharge she does have severe COPD but she has tolerated pneumonia very well Principal Problem:  *CAP (community acquired pneumonia) Active Problems:  COPD  Thoracic aneurysm without mention of rupture  Chest pain    Plan: She will be discharged home with home health services    LOS: 3 days   Jaleigh Mccroskey L 10/12/2012, 9:07 AM

## 2012-10-12 NOTE — Discharge Summary (Signed)
Physician Discharge Summary  Patient ID: Melanie Lowe MRN: [id number] DOB/AGE: [birth year] 78 y.o. Primary Care Physician:Cleven Jansma L, MD Admit date: 10/09/2012 Discharge date: 10/12/2012    Discharge Diagnoses:   Principal Problem:  *CAP (community acquired pneumonia) Active Problems:  COPD  Thoracic aneurysm without mention of rupture  Chest pain  Acute-on-chronic respiratory failure     Medication List     As of 10/12/2012  9:11 AM    TAKE these medications         albuterol (2.5 MG/3ML) 0.083% nebulizer solution   Commonly known as: PROVENTIL   Take 2.5 mg by nebulization at bedtime. For shortness of breath      aspirin EC 81 MG tablet   Take 81 mg by mouth daily.      azithromycin 250 MG tablet   Commonly known as: ZITHROMAX   Take 2 tablets (500 mg) on  Day 1,  followed by 1 tablet (250 mg) once daily on Days 2 through 5.      budesonide-formoterol 160-4.5 MCG/ACT inhaler   Commonly known as: SYMBICORT   Inhale 2 puffs into the lungs 2 (two) times daily.      cefUROXime 250 MG tablet   Commonly known as: CEFTIN   Take 1 tablet (250 mg total) by mouth 2 (two) times daily.      citalopram 20 MG tablet   Commonly known as: CELEXA   Take 20 mg by mouth at bedtime.      diazepam 5 MG tablet   Commonly known as: VALIUM   Take 5 mg by mouth 2 (two) times daily.      furosemide 40 MG tablet   Commonly known as: LASIX   Take 40 mg by mouth daily.      gabapentin 100 MG tablet   Commonly known as: NEURONTIN   Take 100 mg by mouth 5 (five) times daily.      HYDROcodone-acetaminophen 10-325 MG per tablet   Commonly known as: NORCO   Take 1 tablet by mouth every 6 (six) hours as needed. For pain      methylPREDNISolone 4 MG tablet   Commonly known as: MEDROL DOSEPAK   follow package directions      tiotropium 18 MCG inhalation capsule   Commonly known as: SPIRIVA   Place 18 mcg into inhaler and inhale daily.      torsemide 20 MG tablet   Commonly  known as: DEMADEX   Take 20 mg by mouth every morning.        Discharged Condition: Improved    Consults: None  Significant Diagnostic Studies: Dg Chest 2 View  10/09/2012  *RADIOLOGY REPORT*  Clinical Data: 78 year old female weakness and pain.  CHEST - 2 VIEW  Comparison: 10/31/2011.  Findings: Stable scoliosis and cardiomegaly.  Stable mediastinal contours.  Chronic pulmonary hyperinflation.  Increased lower lobe peribronchovascular opacity on the lateral.  Probably this is in the left lower lobe.  Other chronic basilar curvilinear markings appear stable.  No pneumothorax or edema.  No pleural effusion. Osteopenia. No acute osseous abnormality identified.  IMPRESSION: Left lower lobe bronchopneumonia.  Underlying chronic lung disease and cardiomegaly.   Original Report Authenticated By: Harley Hallmark, M.D.     Lab Results: Basic Metabolic Panel:  Basename 10/10/12 0520 10/09/12 2143  NA 138 137  K 3.8 4.1  CL 103 98  CO2 26 29  GLUCOSE 108* 152*  BUN 24* 31*  CREATININE 0.91 1.23*  CALCIUM 9.1 9.7  MG -- --  PHOS -- --   Liver Function Tests:  [facility] 10/10/12 0520 10/09/12 2314  AST 18 23  ALT 13 19  ALKPHOS 68 99  BILITOT 0.3 0.3  PROT 6.6 7.7  ALBUMIN 3.3* 3.9     CBC:  Basename 10/10/12 0520 10/09/12 2143  WBC 5.8 7.3  NEUTROABS -- 4.7  HGB 13.4 14.8  HCT 38.9 43.3  MCV 95.3 96.2  PLT 213 248    No results found for this or any previous visit (from the past 240 hour(s)).   Hospital Course: She was admitted with pneumonia. She came to the emergency room because of chest discomfort. She was found to have a left lower lobe pneumonia. She was started on intravenous antibiotics for community-acquired pneumonia and did very well. She was able to ambulate in the hall without increasing dyspnea. She was found to be ready for discharge on the 13th  Discharge Exam: Blood pressure 115/66, pulse 65, temperature 97.5 F (36.4 C), temperature source Oral,  resp. rate 20, height 5\' 1"  (1.549 m), weight 69.582 kg (153 lb 6.4 oz), SpO2 93.00%. She is awake and alert. Her chest is much clearer. Her heart is regular. Her abdomen is soft.  Disposition: Home with home health services      Discharge Orders    Future Orders Please Complete By Expires   Home Health      Questions: Responses:   To provide the following care/treatments PT    RN   Face-to-face encounter      Comments:   I Myli Pae L certify that this patient is under my care and that I, or a nurse practitioner or physician's assistant working with me, had a face-to-face encounter that meets the physician face-to-face encounter requirements with this patient on 10/12/2012.   Questions: Responses:   The encounter with the patient was in whole, or in part, for the following medical condition, which is the primary reason for home health care pneumonia   I certify that, based on my findings, the following services are medically necessary home health services Nursing    Physical therapy   My clinical findings support the need for the above services Acute exacerbation of COPD   Further, I certify that my clinical findings support that this patient is homebound due to: Ambulates short distances less than 300 feet   To provide the following care/treatments PT    RN   Discharge patient           Signed: Fredirick Maudlin Pager [phone]  10/12/2012, 9:11 AM

## 2012-12-09 ENCOUNTER — Encounter (HOSPITAL_COMMUNITY): Payer: Self-pay

## 2012-12-09 ENCOUNTER — Emergency Department (HOSPITAL_COMMUNITY): Payer: Medicare HMO

## 2012-12-09 ENCOUNTER — Emergency Department (HOSPITAL_COMMUNITY)
Admission: EM | Admit: 2012-12-09 | Discharge: 2012-12-10 | Disposition: A | Discharge status: Home / Self Care | Payer: Medicare HMO | Attending: Emergency Medicine | Admitting: Emergency Medicine

## 2012-12-09 DIAGNOSIS — R059 Cough, unspecified: Secondary | ICD-10-CM | POA: Insufficient documentation

## 2012-12-09 DIAGNOSIS — J441 Chronic obstructive pulmonary disease with (acute) exacerbation: Secondary | ICD-10-CM | POA: Insufficient documentation

## 2012-12-09 DIAGNOSIS — I252 Old myocardial infarction: Secondary | ICD-10-CM | POA: Insufficient documentation

## 2012-12-09 DIAGNOSIS — F329 Major depressive disorder, single episode, unspecified: Secondary | ICD-10-CM | POA: Insufficient documentation

## 2012-12-09 DIAGNOSIS — F3289 Other specified depressive episodes: Secondary | ICD-10-CM | POA: Insufficient documentation

## 2012-12-09 DIAGNOSIS — I714 Abdominal aortic aneurysm, without rupture, unspecified: Secondary | ICD-10-CM | POA: Insufficient documentation

## 2012-12-09 DIAGNOSIS — R05 Cough: Secondary | ICD-10-CM | POA: Insufficient documentation

## 2012-12-09 DIAGNOSIS — Z87891 Personal history of nicotine dependence: Secondary | ICD-10-CM | POA: Insufficient documentation

## 2012-12-09 DIAGNOSIS — Z8701 Personal history of pneumonia (recurrent): Secondary | ICD-10-CM | POA: Insufficient documentation

## 2012-12-09 DIAGNOSIS — R0789 Other chest pain: Secondary | ICD-10-CM | POA: Insufficient documentation

## 2012-12-09 DIAGNOSIS — M129 Arthropathy, unspecified: Secondary | ICD-10-CM | POA: Insufficient documentation

## 2012-12-09 DIAGNOSIS — I10 Essential (primary) hypertension: Secondary | ICD-10-CM | POA: Insufficient documentation

## 2012-12-09 DIAGNOSIS — Z7982 Long term (current) use of aspirin: Secondary | ICD-10-CM | POA: Insufficient documentation

## 2012-12-09 DIAGNOSIS — Z9981 Dependence on supplemental oxygen: Secondary | ICD-10-CM | POA: Insufficient documentation

## 2012-12-09 DIAGNOSIS — R079 Chest pain, unspecified: Secondary | ICD-10-CM

## 2012-12-09 DIAGNOSIS — F411 Generalized anxiety disorder: Secondary | ICD-10-CM | POA: Insufficient documentation

## 2012-12-09 DIAGNOSIS — Z79899 Other long term (current) drug therapy: Secondary | ICD-10-CM | POA: Insufficient documentation

## 2012-12-09 HISTORY — DX: Pneumonia, unspecified organism: J18.9

## 2012-12-09 MED ORDER — IPRATROPIUM BROMIDE 0.02 % IN SOLN
0.5000 mg | Freq: Once | RESPIRATORY_TRACT | Status: AC
  Administered 2012-12-09: 0.5 mg via RESPIRATORY_TRACT
  Filled 2012-12-09: qty 2.5

## 2012-12-09 MED ORDER — ALBUTEROL SULFATE (5 MG/ML) 0.5% IN NEBU
2.5000 mg | INHALATION_SOLUTION | Freq: Once | RESPIRATORY_TRACT | Status: AC
  Administered 2012-12-09: 2.5 mg via RESPIRATORY_TRACT
  Filled 2012-12-09: qty 0.5

## 2012-12-09 NOTE — ED Provider Notes (Signed)
History   This chart was scribed for Melanie Booze, MD by Sofie Rower, ED Scribe. The patient was seen in room APA10/[facility]10 and the patient's care was started at 11:30PM.     CSN: [id number]  Arrival date & time 12/09/12  2258   None     Chief Complaint  Patient presents with  . Back Pain  . Chest Pain    (Consider location/radiation/quality/duration/timing/severity/associated sxs/prior treatment) The history is provided by the patient and a relative. No language interpreter was used.    Melanie Lowe is a 78 y.o. female , with a hx of AAA (January 2011), COPD, and pneumonia, who presents to the Emergency Department complaining of sudden, progressively worsening, chest pain located at the left side of the chest, onset two days ago (12/07/12).  Associated symptoms include productive pink cough and back pain. The pt reports the chest pain she is experiencing is a sharp, painful sensation, rating her chest pain at an 8/10 at present. The pt has not taken any medications to relieve her chest pain at present time. Modifying factors include deep breathing and ambulation which intensifies the chest pain.   The pt denies nausea and sweats.   The pt does not smoke, however, she is a former smoker of 50 years. The pt does not drink alcohol.    PCP is Dr. Juanetta Gosling.    Past Medical History  Diagnosis Date  . COPD (chronic obstructive pulmonary disease)   . Hypertension   . Anxiety   . Depression   . Arthritis   . AAA (abdominal aortic aneurysm)   . Neuropathy   . Leg swelling   . Oxygen dependent   . Myocardial infarction   . Pneumonia     Past Surgical History  Procedure Date  . Cholecystectomy   . Appendectomy     Family History  Problem Relation Age of Onset  . Diabetes Brother   . Cancer Brother     Throat cancer, Heart attack  . Heart disease Brother   . Heart attack Brother   . Cancer Father   . Cancer Sister     History  Substance Use Topics  . Smoking status: Former  Smoker -- 50 years    Types: Cigarettes    Quit date: 12/31/2010  . Smokeless tobacco: Former Neurosurgeon    Quit date: 12/31/2010  . Alcohol Use: No    OB History    Grav Para Term Preterm Abortions TAB SAB Ect Mult Living                  Review of Systems  Constitutional: Negative for diaphoresis.  Respiratory: Positive for cough.   Cardiovascular: Positive for chest pain.  Gastrointestinal: Negative for nausea.  Musculoskeletal: Positive for back pain.    Allergies  Other and Tomato  Home Medications   Current Outpatient Rx  Name  Route  Sig  Dispense  Refill  . ALBUTEROL SULFATE (2.5 MG/3ML) 0.083% IN NEBU   Nebulization   Take 2.5 mg by nebulization at bedtime. For shortness of breath         . ASPIRIN EC 81 MG PO TBEC   Oral   Take 81 mg by mouth daily.         . BUDESONIDE-FORMOTEROL FUMARATE 160-4.5 MCG/ACT IN AERO   Inhalation   Inhale 2 puffs into the lungs 2 (two) times daily.           Marland Kitchen CITALOPRAM HYDROBROMIDE 20 MG PO TABS  Oral   Take 20 mg by mouth at bedtime.          Marland Kitchen DIAZEPAM 5 MG PO TABS   Oral   Take 5 mg by mouth 2 (two) times daily.          . FUROSEMIDE 40 MG PO TABS   Oral   Take 40 mg by mouth daily.         Marland Kitchen GABAPENTIN 100 MG PO TABS   Oral   Take 100 mg by mouth 5 (five) times daily.          Marland Kitchen HYDROCODONE-ACETAMINOPHEN 10-325 MG PO TABS   Oral   Take 1 tablet by mouth every 6 (six) hours as needed. For pain         . METHYLPREDNISOLONE 4 MG PO KIT      follow package directions   21 tablet   0   . TIOTROPIUM BROMIDE MONOHYDRATE 18 MCG IN CAPS   Inhalation   Place 18 mcg into inhaler and inhale daily.          . TORSEMIDE 20 MG PO TABS   Oral   Take 20 mg by mouth every morning.            BP 145/93  Pulse 69  Temp 97.6 F (36.4 C) (Oral)  Resp 16  Ht 5\' 1"  (1.549 m)  Wt 162 lb (73.483 kg)  BMI 30.61 kg/m2  SpO2 96%  Physical Exam  Nursing note and vitals reviewed. Constitutional: She  is oriented to person, place, and time. She appears well-developed and well-nourished. No distress.  HENT:  Head: Atraumatic.  Nose: Nose normal.  Eyes: EOM are normal.  Neck: Normal range of motion. Neck supple.  Cardiovascular: Normal rate, regular rhythm and normal heart sounds.   Pulmonary/Chest: Effort normal. She has rales. She exhibits tenderness.       Rales present at both bases. Prolonged expiration phase without wheezes or rhonchi. Mild left peri sternal tenderness.   Abdominal: Soft. Bowel sounds are normal.  Musculoskeletal: Normal range of motion.  Neurological: She is alert and oriented to person, place, and time.  Skin: Skin is warm and dry. She is not diaphoretic.  Psychiatric: She has a normal mood and affect. Her behavior is normal.    ED Course  Procedures (including critical care time)  DIAGNOSTIC STUDIES: Oxygen Saturation is 96% on room air, normal by my interpretation.    COORDINATION OF CARE:  11:39 PM- Treatment plan concerning chest x-ray and EKG discussed with patient. Pt agrees with treatment.      Results for orders placed during the hospital encounter of 12/09/12  CBC WITH DIFFERENTIAL      Component Value Range   WBC 5.7  4.0 - 10.5 K/uL   RBC 4.27  3.87 - 5.11 MIL/uL   Hemoglobin 13.9  12.0 - 15.0 g/dL   HCT 16.1  09.6 - 04.5 %   MCV 96.3  78.0 - 100.0 fL   MCH 32.6  26.0 - 34.0 pg   MCHC 33.8  30.0 - 36.0 g/dL   RDW 40.9  81.1 - 91.4 %   Platelets 217  150 - 400 K/uL   Neutrophils Relative 47  43 - 77 %   Neutro Abs 2.7  1.7 - 7.7 K/uL   Lymphocytes Relative 42  12 - 46 %   Lymphs Abs 2.4  0.7 - 4.0 K/uL   Monocytes Relative 9  3 - 12 %  Monocytes Absolute 0.5  0.1 - 1.0 K/uL   Eosinophils Relative 2  0 - 5 %   Eosinophils Absolute 0.1  0.0 - 0.7 K/uL   Basophils Relative 0  0 - 1 %   Basophils Absolute 0.0  0.0 - 0.1 K/uL  BASIC METABOLIC PANEL      Component Value Range   Sodium 138  135 - 145 mEq/L   Potassium 3.7  3.5 - 5.1  mEq/L   Chloride 100  96 - 112 mEq/L   CO2 30  19 - 32 mEq/L   Glucose, Bld 84  70 - 99 mg/dL   BUN 26 (*) 6 - 23 mg/dL   Creatinine, Ser 0.98  0.50 - 1.10 mg/dL   Calcium 9.7  8.4 - 11.9 mg/dL   GFR calc non Af Amer 46 (*) >90 mL/min   GFR calc Af Amer 53 (*) >90 mL/min  PRO B NATRIURETIC PEPTIDE      Component Value Range   Pro B Natriuretic peptide (BNP) 185.6  0 - 450 pg/mL  TROPONIN I      Component Value Range   Troponin I <0.30  <0.30 ng/mL   Dg Chest 2 View  12/10/2012  *RADIOLOGY REPORT*  Clinical Data: Left-sided chest pain, shortness of breath, cough.  CHEST - 2 VIEW  Comparison: 10/09/2012  Findings: Emphysematous changes and scattered fibrosis in the lungs.  Linear opacities in the right lung base appears somewhat more prominent than on previous study.  Changes likely to represent chronic fibrosis possibly with superimposed infiltration. Suggestion of infiltration or fibrosis in the left lung base behind the heart.  This is less prominent on the previous study.  The no blunting of costophrenic angles.  No pneumothorax.  Calcified, ectatic, and tortuous aorta.  Degenerative changes in the spine.  IMPRESSION: Fibrosis or atelectasis in both lung bases.  Diffuse emphysema.   Original Report Authenticated By: Burman Nieves, M.D.     Date: 12/10/2012  Rate: 57  Rhythm: sinus bradycardia  QRS Axis: normal  Intervals: PR prolonged  ST/T Wave abnormalities: normal  Conduction Disutrbances:first-degree A-V block   Narrative Interpretation: Sinus bradycardia with first-degree A-V block. When compared with ECG of 10/09/2012, no significant changes are seen.  Old EKG Reviewed: unchanged     1. COPD exacerbation   2. Chest pain       MDM  Chest pain and dyspnea-need to rule out pneumonia. Bibasilar rales are heard concerning for possible congestive heart failure. Chest x-ray will be obtained and showed given albuterol with Atrovent and reassessed.  Laboratory workup is  negative including normal beta natruretic protein. She feels much better after albuterol with Atrovent. Her old records are reviewed, and she does have an expanding thoracoabdominal aneurysm which apparently is not amenable to surgery. She will be treated for exacerbation of COPD with a course of prednisone and given Percocet for pain. She is to followup with her PCP.      I personally performed the services described in this documentation, which was scribed in my presence. The recorded information has been reviewed and is accurate.     Melanie Booze, MD 12/10/12 [phone]

## 2012-12-09 NOTE — ED Notes (Signed)
Back pain and chest pain; feels like I have pneumonia again per pt.

## 2012-12-10 LAB — CBC WITH DIFFERENTIAL/PLATELET
Basophils Absolute: 0 10*3/uL (ref 0.0–0.1)
Basophils Relative: 0 % (ref 0–1)
Eosinophils Absolute: 0.1 10*3/uL (ref 0.0–0.7)
Eosinophils Relative: 2 % (ref 0–5)
Hemoglobin: 13.9 g/dL (ref 12.0–15.0)
MCH: 32.6 pg (ref 26.0–34.0)
MCHC: 33.8 g/dL (ref 30.0–36.0)
Monocytes Relative: 9 % (ref 3–12)
Neutrophils Relative %: 47 % (ref 43–77)

## 2012-12-10 LAB — TROPONIN I: Troponin I: <0.3 ng/mL (ref <0.30)

## 2012-12-10 LAB — PRO B NATRIURETIC PEPTIDE: Pro B Natriuretic peptide (BNP): 185.6 pg/mL (ref 0–450)

## 2012-12-10 LAB — BASIC METABOLIC PANEL
CO2: 30 mEq/L (ref 19–32)
Chloride: 100 mEq/L (ref 96–112)
Glucose, Bld: 84 mg/dL (ref 70–99)
Sodium: 138 mEq/L (ref 135–145)

## 2012-12-10 MED ORDER — PREDNISONE 50 MG PO TABS: 50.0000 mg | ORAL_TABLET | Freq: Every day | ORAL | Status: DC

## 2012-12-10 MED ORDER — OXYCODONE-ACETAMINOPHEN 5-325 MG PO TABS: 1.0000 | ORAL_TABLET | ORAL | Status: AC | PRN

## 2012-12-10 MED ORDER — METHYLPREDNISOLONE SODIUM SUCC 125 MG IJ SOLR
125.0000 mg | Freq: Once | INTRAMUSCULAR | Status: DC
  Filled 2012-12-10: qty 2

## 2012-12-10 MED ORDER — OXYCODONE-ACETAMINOPHEN 5-325 MG PO TABS
1.0000 | ORAL_TABLET | Freq: Once | ORAL | Status: AC
  Administered 2012-12-10: 1 via ORAL
  Filled 2012-12-10: qty 1

## 2012-12-10 MED ORDER — PREDNISONE 50 MG PO TABS
60.0000 mg | ORAL_TABLET | Freq: Once | ORAL | Status: AC
  Administered 2012-12-10: 60 mg via ORAL
  Filled 2012-12-10: qty 1

## 2012-12-10 NOTE — ED Notes (Signed)
Call [phone] when pt is ready for discharge.

## 2012-12-28 ENCOUNTER — Emergency Department (HOSPITAL_COMMUNITY)
Admission: EM | Admit: 2012-12-28 | Discharge: 2012-12-29 | Disposition: A | Discharge status: Home / Self Care | Payer: Medicare HMO | Attending: Emergency Medicine | Admitting: Emergency Medicine

## 2012-12-28 ENCOUNTER — Encounter (HOSPITAL_COMMUNITY): Payer: Self-pay | Admitting: Emergency Medicine

## 2012-12-28 DIAGNOSIS — R059 Cough, unspecified: Secondary | ICD-10-CM | POA: Insufficient documentation

## 2012-12-28 DIAGNOSIS — R112 Nausea with vomiting, unspecified: Secondary | ICD-10-CM | POA: Insufficient documentation

## 2012-12-28 DIAGNOSIS — M129 Arthropathy, unspecified: Secondary | ICD-10-CM | POA: Insufficient documentation

## 2012-12-28 DIAGNOSIS — R079 Chest pain, unspecified: Secondary | ICD-10-CM

## 2012-12-28 DIAGNOSIS — R062 Wheezing: Secondary | ICD-10-CM | POA: Insufficient documentation

## 2012-12-28 DIAGNOSIS — R52 Pain, unspecified: Secondary | ICD-10-CM | POA: Insufficient documentation

## 2012-12-28 DIAGNOSIS — F411 Generalized anxiety disorder: Secondary | ICD-10-CM | POA: Insufficient documentation

## 2012-12-28 DIAGNOSIS — R0789 Other chest pain: Secondary | ICD-10-CM | POA: Insufficient documentation

## 2012-12-28 DIAGNOSIS — Z79899 Other long term (current) drug therapy: Secondary | ICD-10-CM | POA: Insufficient documentation

## 2012-12-28 DIAGNOSIS — R05 Cough: Secondary | ICD-10-CM | POA: Insufficient documentation

## 2012-12-28 DIAGNOSIS — Z8669 Personal history of other diseases of the nervous system and sense organs: Secondary | ICD-10-CM | POA: Insufficient documentation

## 2012-12-28 DIAGNOSIS — J449 Chronic obstructive pulmonary disease, unspecified: Secondary | ICD-10-CM | POA: Insufficient documentation

## 2012-12-28 DIAGNOSIS — Z8701 Personal history of pneumonia (recurrent): Secondary | ICD-10-CM | POA: Insufficient documentation

## 2012-12-28 DIAGNOSIS — Z8679 Personal history of other diseases of the circulatory system: Secondary | ICD-10-CM | POA: Insufficient documentation

## 2012-12-28 DIAGNOSIS — J4489 Other specified chronic obstructive pulmonary disease: Secondary | ICD-10-CM | POA: Insufficient documentation

## 2012-12-28 DIAGNOSIS — I252 Old myocardial infarction: Secondary | ICD-10-CM | POA: Insufficient documentation

## 2012-12-28 DIAGNOSIS — F3289 Other specified depressive episodes: Secondary | ICD-10-CM | POA: Insufficient documentation

## 2012-12-28 DIAGNOSIS — I10 Essential (primary) hypertension: Secondary | ICD-10-CM | POA: Insufficient documentation

## 2012-12-28 DIAGNOSIS — F329 Major depressive disorder, single episode, unspecified: Secondary | ICD-10-CM | POA: Insufficient documentation

## 2012-12-28 DIAGNOSIS — Z87891 Personal history of nicotine dependence: Secondary | ICD-10-CM | POA: Insufficient documentation

## 2012-12-28 DIAGNOSIS — Z7982 Long term (current) use of aspirin: Secondary | ICD-10-CM | POA: Insufficient documentation

## 2012-12-28 NOTE — ED Notes (Signed)
Pt c/o generalized body aches with n/v/d starting yesterday.  Says today she just "felt weak all of a sudden".  C/o "pressure in my chest".

## 2012-12-28 NOTE — ED Notes (Signed)
Pt c/o "flu-like symptoms" Per EMS stated feels "heaviness, pressure in chest"

## 2012-12-29 ENCOUNTER — Emergency Department (HOSPITAL_COMMUNITY): Payer: Medicare HMO

## 2012-12-29 LAB — CBC WITH DIFFERENTIAL/PLATELET
Basophils Absolute: 0 10*3/uL (ref 0.0–0.1)
Basophils Relative: 0 % (ref 0–1)
Eosinophils Absolute: 0.1 10*3/uL (ref 0.0–0.7)
Eosinophils Relative: 2 % (ref 0–5)
Lymphocytes Relative: 44 % (ref 12–46)
MCH: 32.9 pg (ref 26.0–34.0)
MCHC: 34.5 g/dL (ref 30.0–36.0)
MCV: 95.4 fL (ref 78.0–100.0)
Platelets: 230 10*3/uL (ref 150–400)
RDW: 13.2 % (ref 11.5–15.5)
WBC: 6.2 10*3/uL (ref 4.0–10.5)

## 2012-12-29 LAB — TROPONIN I: Troponin I: <0.3 ng/mL (ref <0.30)

## 2012-12-29 LAB — BASIC METABOLIC PANEL
CO2: 31 mEq/L (ref 19–32)
Calcium: 10.1 mg/dL (ref 8.4–10.5)
Creatinine, Ser: 1 mg/dL (ref 0.50–1.10)

## 2012-12-29 MED ORDER — IPRATROPIUM BROMIDE 0.02 % IN SOLN
0.5000 mg | Freq: Once | RESPIRATORY_TRACT | Status: AC
  Administered 2012-12-29: 0.5 mg via RESPIRATORY_TRACT
  Filled 2012-12-29: qty 2.5

## 2012-12-29 MED ORDER — MORPHINE SULFATE 2 MG/ML IJ SOLN
2.0000 mg | Freq: Once | INTRAMUSCULAR | Status: AC
  Administered 2012-12-29: 2 mg via INTRAVENOUS
  Filled 2012-12-29: qty 1

## 2012-12-29 MED ORDER — ALBUTEROL SULFATE (5 MG/ML) 0.5% IN NEBU
5.0000 mg | INHALATION_SOLUTION | Freq: Once | RESPIRATORY_TRACT | Status: AC
  Administered 2012-12-29: 5 mg via RESPIRATORY_TRACT
  Filled 2012-12-29: qty 1

## 2012-12-29 MED ORDER — ONDANSETRON HCL 4 MG/2ML IJ SOLN
4.0000 mg | Freq: Once | INTRAMUSCULAR | Status: AC
  Administered 2012-12-29: 4 mg via INTRAVENOUS
  Filled 2012-12-29: qty 2

## 2012-12-29 MED ORDER — SODIUM CHLORIDE 0.9 % IV BOLUS (SEPSIS)
500.0000 mL | Freq: Once | INTRAVENOUS | Status: AC
  Administered 2012-12-29: 500 mL via INTRAVENOUS

## 2012-12-29 MED ORDER — SODIUM CHLORIDE 0.9 % IV SOLN
Freq: Once | INTRAVENOUS | Status: AC
  Administered 2012-12-29: 01:00:00 via INTRAVENOUS

## 2012-12-29 NOTE — ED Notes (Signed)
Patient placed on 3 liters of oxygen via nasal canula. Reports wears 3 liters at home.

## 2012-12-29 NOTE — ED Notes (Signed)
Called patient's son to see if he could could give patient ride home, son stated he could not give patient ride home until later in the morning. Patient reports that she does not know of anyone else to call for ride.

## 2012-12-29 NOTE — ED Provider Notes (Signed)
History     CSN: [id number]  Arrival date & time 12/28/12  2346   First MD Initiated Contact with Patient 12/29/12 0003      Chief Complaint  Patient presents with  . Nausea  . Emesis  . Generalized Body Aches  . Chest Pain    (Consider location/radiation/quality/duration/timing/severity/associated sxs/prior treatment) HPI  Melanie Lowe is a 78 y.o. female  With a h/o COPD on home O2 at 3L, HTN, anxiety brought in by ambulance, who presents to the Emergency Department complaining of generalized body aches and pain that began today, chest pain to the left side of her chest similar to what she experienced last week when she was seen in the ER, mild nausea without vomiting. She has taken no medicines. Nothing is improving her discomfort and nothing is making it worse.   PCP Dr. Juanetta Gosling Past Medical History  Diagnosis Date  . COPD (chronic obstructive pulmonary disease)   . Hypertension   . Anxiety   . Depression   . Arthritis   . AAA (abdominal aortic aneurysm)   . Neuropathy   . Leg swelling   . Oxygen dependent   . Myocardial infarction   . Pneumonia     Past Surgical History  Procedure Date  . Cholecystectomy   . Appendectomy     Family History  Problem Relation Age of Onset  . Diabetes Brother   . Cancer Brother     Throat cancer, Heart attack  . Heart disease Brother   . Heart attack Brother   . Cancer Father   . Cancer Sister     History  Substance Use Topics  . Smoking status: Former Smoker -- 50 years    Types: Cigarettes    Quit date: 12/31/2010  . Smokeless tobacco: Former Neurosurgeon    Quit date: 12/31/2010  . Alcohol Use: No    OB History    Grav Para Term Preterm Abortions TAB SAB Ect Mult Living                  Review of Systems  Constitutional: Negative for fever.       10 Systems reviewed and are negative for acute change except as noted in the HPI.  HENT: Negative for congestion.   Eyes: Negative for discharge and redness.    Respiratory: Positive for cough and wheezing. Negative for shortness of breath.   Cardiovascular: Positive for chest pain.  Gastrointestinal: Negative for vomiting and abdominal pain.  Musculoskeletal: Positive for myalgias. Negative for back pain.  Skin: Negative for rash.  Neurological: Negative for syncope, numbness and headaches.  Psychiatric/Behavioral:       No behavior change.    Allergies  Other and Tomato  Home Medications   Current Outpatient Rx  Name  Route  Sig  Dispense  Refill  . ALBUTEROL SULFATE (2.5 MG/3ML) 0.083% IN NEBU   Nebulization   Take 2.5 mg by nebulization at bedtime. For shortness of breath         . ASPIRIN EC 81 MG PO TBEC   Oral   Take 81 mg by mouth daily.         . BUDESONIDE-FORMOTEROL FUMARATE 160-4.5 MCG/ACT IN AERO   Inhalation   Inhale 2 puffs into the lungs 2 (two) times daily.           Marland Kitchen CITALOPRAM HYDROBROMIDE 20 MG PO TABS   Oral   Take 20 mg by mouth at bedtime.          Marland Kitchen  DIAZEPAM 5 MG PO TABS   Oral   Take 5 mg by mouth 2 (two) times daily.          . FUROSEMIDE 40 MG PO TABS   Oral   Take 40 mg by mouth daily.         Marland Kitchen GABAPENTIN 100 MG PO TABS   Oral   Take 100 mg by mouth 5 (five) times daily.          Marland Kitchen HYDROCODONE-ACETAMINOPHEN 10-325 MG PO TABS   Oral   Take 1 tablet by mouth every 6 (six) hours as needed. For pain         . METHYLPREDNISOLONE 4 MG PO KIT      follow package directions   21 tablet   0   . PREDNISONE 50 MG PO TABS   Oral   Take 1 tablet (50 mg total) by mouth daily.   5 tablet   0   . TIOTROPIUM BROMIDE MONOHYDRATE 18 MCG IN CAPS   Inhalation   Place 18 mcg into inhaler and inhale daily.          . TORSEMIDE 20 MG PO TABS   Oral   Take 20 mg by mouth every morning.            BP 125/72  Pulse 57  Temp 98.3 F (36.8 C) (Oral)  Resp 15  SpO2 95%  Physical Exam  Nursing note and vitals reviewed. Constitutional:       Awake, alert, nontoxic  appearance.  HENT:  Head: Normocephalic and atraumatic.  Right Ear: External ear normal.  Left Ear: External ear normal.  Eyes: EOM are normal. Pupils are equal, round, and reactive to light. Right eye exhibits no discharge. Left eye exhibits no discharge.       conjunctival injection to left eye  Neck: Normal range of motion. Neck supple.  Cardiovascular: Normal rate and normal heart sounds.   Pulmonary/Chest: Effort normal. She exhibits no tenderness.       Prolonged expirations. crackles at both bases. Occasional expiratory wheeze  Abdominal: Soft. There is no tenderness. There is no rebound.  Musculoskeletal: She exhibits no tenderness.       Baseline ROM, no obvious new focal weakness.arthritic changes to hands.  Neurological:       Mental status and motor strength appears baseline for patient and situation.  Skin: No rash noted.  Psychiatric: She has a normal mood and affect.    ED Course  Procedures (including critical care time) Results for orders placed during the hospital encounter of 12/28/12  BASIC METABOLIC PANEL      Component Value Range   Sodium 134 (*) 135 - 145 mEq/L   Potassium 3.8  3.5 - 5.1 mEq/L   Chloride 96  96 - 112 mEq/L   CO2 31  19 - 32 mEq/L   Glucose, Bld 91  70 - 99 mg/dL   BUN 22  6 - 23 mg/dL   Creatinine, Ser 1.61  0.50 - 1.10 mg/dL   Calcium 09.6  8.4 - 04.5 mg/dL   GFR calc non Af Amer 50 (*) >90 mL/min   GFR calc Af Amer 57 (*) >90 mL/min  CBC WITH DIFFERENTIAL      Component Value Range   WBC 6.2  4.0 - 10.5 K/uL   RBC 4.32  3.87 - 5.11 MIL/uL   Hemoglobin 14.2  12.0 - 15.0 g/dL   HCT 40.9  81.1 - 91.4 %  MCV 95.4  78.0 - 100.0 fL   MCH 32.9  26.0 - 34.0 pg   MCHC 34.5  30.0 - 36.0 g/dL   RDW 40.9  81.1 - 91.4 %   Platelets 230  150 - 400 K/uL   Neutrophils Relative 46  43 - 77 %   Neutro Abs 2.9  1.7 - 7.7 K/uL   Lymphocytes Relative 44  12 - 46 %   Lymphs Abs 2.7  0.7 - 4.0 K/uL   Monocytes Relative 8  3 - 12 %   Monocytes  Absolute 0.5  0.1 - 1.0 K/uL   Eosinophils Relative 2  0 - 5 %   Eosinophils Absolute 0.1  0.0 - 0.7 K/uL   Basophils Relative 0  0 - 1 %   Basophils Absolute 0.0  0.0 - 0.1 K/uL  TROPONIN I      Component Value Range   Troponin I <0.30  <0.30 ng/mL    Date: 12/28/2012   2255  Rate: 57  Rhythm: sinus bradycardia  QRS Axis: normal  Intervals: normal  ST/T Wave abnormalities: normal  Conduction Disutrbances:first-degree A-V block   Narrative Interpretation:   Old EKG Reviewed: unchanged c/w 12/09/12  Dg Chest Portable 1 View  12/29/2012  *RADIOLOGY REPORT*  Clinical Data: Nausea and emesis; generalized body aches.  Chest pain.  PORTABLE CHEST - 1 VIEW  Comparison: Chest radiograph performed 12/10/2012  Findings: The lungs are well-aerated.  Bibasilar airspace opacities may reflect atelectasis or pneumonia.  There is elevation of the left hemidiaphragm.  There is no evidence of pleural effusion or pneumothorax.  The cardiomediastinal silhouette is borderline normal in size; calcification is noted within the aortic arch.  No acute osseous abnormalities are seen.  IMPRESSION: Bibasilar airspace opacities may reflect atelectasis or pneumonia; elevation of the left hemidiaphragm.   Original Report Authenticated By: Tonia Ghent, M.D.    [phone] Advised by nursing that patient does not have a ride home until morning. She will remain in the ER. MDM  Patient with COPD here with generalized body aches, sharp left sided chest pain, nausea and vomiting. Labs are unremarkable. Xray is essentially unchanged. She was given IVF, nebulizer treatment, antiemetic and analgesic. She feels better. Pt stable in ED with no significant deterioration in condition.The patient appears reasonably screened and/or stabilized for discharge and I doubt any other medical condition or other [facility] requiring further screening, evaluation, or treatment in the ED at this time prior to discharge.  MDM Reviewed: nursing note and  vitals Interpretation: labs, ECG and x-ray           Nicoletta Dress. Colon Branch, MD 12/29/12 [phone]

## 2013-06-20 ENCOUNTER — Emergency Department (HOSPITAL_COMMUNITY): Payer: Medicare HMO

## 2013-06-20 ENCOUNTER — Encounter (HOSPITAL_COMMUNITY): Payer: Self-pay

## 2013-06-20 ENCOUNTER — Emergency Department (HOSPITAL_COMMUNITY)
Admission: EM | Admit: 2013-06-20 | Discharge: 2013-06-20 | Disposition: A | Discharge status: Home / Self Care | Payer: Medicare HMO | Attending: Emergency Medicine | Admitting: Emergency Medicine

## 2013-06-20 DIAGNOSIS — Z8739 Personal history of other diseases of the musculoskeletal system and connective tissue: Secondary | ICD-10-CM | POA: Insufficient documentation

## 2013-06-20 DIAGNOSIS — F411 Generalized anxiety disorder: Secondary | ICD-10-CM | POA: Insufficient documentation

## 2013-06-20 DIAGNOSIS — G589 Mononeuropathy, unspecified: Secondary | ICD-10-CM | POA: Insufficient documentation

## 2013-06-20 DIAGNOSIS — Z8701 Personal history of pneumonia (recurrent): Secondary | ICD-10-CM | POA: Insufficient documentation

## 2013-06-20 DIAGNOSIS — Z8679 Personal history of other diseases of the circulatory system: Secondary | ICD-10-CM | POA: Insufficient documentation

## 2013-06-20 DIAGNOSIS — J209 Acute bronchitis, unspecified: Secondary | ICD-10-CM | POA: Insufficient documentation

## 2013-06-20 DIAGNOSIS — J441 Chronic obstructive pulmonary disease with (acute) exacerbation: Secondary | ICD-10-CM | POA: Insufficient documentation

## 2013-06-20 DIAGNOSIS — F3289 Other specified depressive episodes: Secondary | ICD-10-CM | POA: Insufficient documentation

## 2013-06-20 DIAGNOSIS — M129 Arthropathy, unspecified: Secondary | ICD-10-CM | POA: Insufficient documentation

## 2013-06-20 DIAGNOSIS — Z79899 Other long term (current) drug therapy: Secondary | ICD-10-CM | POA: Insufficient documentation

## 2013-06-20 DIAGNOSIS — I10 Essential (primary) hypertension: Secondary | ICD-10-CM | POA: Insufficient documentation

## 2013-06-20 DIAGNOSIS — Z7982 Long term (current) use of aspirin: Secondary | ICD-10-CM | POA: Insufficient documentation

## 2013-06-20 DIAGNOSIS — Z9981 Dependence on supplemental oxygen: Secondary | ICD-10-CM | POA: Insufficient documentation

## 2013-06-20 DIAGNOSIS — J40 Bronchitis, not specified as acute or chronic: Secondary | ICD-10-CM

## 2013-06-20 DIAGNOSIS — Z87891 Personal history of nicotine dependence: Secondary | ICD-10-CM | POA: Insufficient documentation

## 2013-06-20 DIAGNOSIS — R5381 Other malaise: Secondary | ICD-10-CM | POA: Insufficient documentation

## 2013-06-20 DIAGNOSIS — F329 Major depressive disorder, single episode, unspecified: Secondary | ICD-10-CM | POA: Insufficient documentation

## 2013-06-20 DIAGNOSIS — IMO0002 Reserved for concepts with insufficient information to code with codable children: Secondary | ICD-10-CM | POA: Insufficient documentation

## 2013-06-20 DIAGNOSIS — E876 Hypokalemia: Secondary | ICD-10-CM

## 2013-06-20 DIAGNOSIS — I252 Old myocardial infarction: Secondary | ICD-10-CM | POA: Insufficient documentation

## 2013-06-20 LAB — TROPONIN I: Troponin I: <0.3 ng/mL (ref <0.30)

## 2013-06-20 LAB — BASIC METABOLIC PANEL
Chloride: 93 mEq/L — ABNORMAL LOW (ref 96–112)
Creatinine, Ser: 1.15 mg/dL — ABNORMAL HIGH (ref 0.50–1.10)
GFR calc Af Amer: 48 mL/min — ABNORMAL LOW (ref >90)
GFR calc non Af Amer: 42 mL/min — ABNORMAL LOW (ref >90)
Potassium: 3 mEq/L — ABNORMAL LOW (ref 3.5–5.1)

## 2013-06-20 LAB — CBC WITH DIFFERENTIAL/PLATELET
Basophils Absolute: 0 10*3/uL (ref 0.0–0.1)
Basophils Relative: 0 % (ref 0–1)
MCHC: 34 g/dL (ref 30.0–36.0)
Neutro Abs: 3.1 10*3/uL (ref 1.7–7.7)
Neutrophils Relative %: 43 % (ref 43–77)
Platelets: 232 10*3/uL (ref 150–400)
RDW: 13.2 % (ref 11.5–15.5)

## 2013-06-20 MED ORDER — POTASSIUM CHLORIDE CRYS ER 20 MEQ PO TBCR: 20.0000 meq | EXTENDED_RELEASE_TABLET | Freq: Every day | ORAL | Status: DC

## 2013-06-20 MED ORDER — ALBUTEROL SULFATE (5 MG/ML) 0.5% IN NEBU
INHALATION_SOLUTION | RESPIRATORY_TRACT | Status: AC
  Administered 2013-06-20: 5 mg via RESPIRATORY_TRACT
  Filled 2013-06-20: qty 1

## 2013-06-20 MED ORDER — LEVOFLOXACIN 500 MG PO TABS: 500.0000 mg | ORAL_TABLET | Freq: Every day | ORAL | Status: DC

## 2013-06-20 MED ORDER — ALBUTEROL SULFATE (5 MG/ML) 0.5% IN NEBU
5.0000 mg | INHALATION_SOLUTION | Freq: Once | RESPIRATORY_TRACT | Status: AC
  Administered 2013-06-20: 5 mg via RESPIRATORY_TRACT

## 2013-06-20 MED ORDER — POTASSIUM CHLORIDE CRYS ER 20 MEQ PO TBCR
40.0000 meq | EXTENDED_RELEASE_TABLET | Freq: Once | ORAL | Status: AC
  Administered 2013-06-20: 40 meq via ORAL
  Filled 2013-06-20: qty 2

## 2013-06-20 MED ORDER — IPRATROPIUM BROMIDE 0.02 % IN SOLN
RESPIRATORY_TRACT | Status: AC
  Administered 2013-06-20: 0.5 mg via RESPIRATORY_TRACT
  Filled 2013-06-20: qty 2.5

## 2013-06-20 MED ORDER — PREDNISONE 50 MG PO TABS
60.0000 mg | ORAL_TABLET | Freq: Once | ORAL | Status: AC
  Administered 2013-06-20: 60 mg via ORAL
  Filled 2013-06-20: qty 1

## 2013-06-20 MED ORDER — IPRATROPIUM BROMIDE 0.02 % IN SOLN
0.5000 mg | Freq: Once | RESPIRATORY_TRACT | Status: AC
  Administered 2013-06-20: 0.5 mg via RESPIRATORY_TRACT

## 2013-06-20 MED ORDER — IOHEXOL 350 MG/ML SOLN
100.0000 mL | Freq: Once | INTRAVENOUS | Status: AC | PRN
  Administered 2013-06-20: 100 mL via INTRAVENOUS

## 2013-06-20 MED ORDER — LEVOFLOXACIN IN D5W 750 MG/150ML IV SOLN
750.0000 mg | Freq: Once | INTRAVENOUS | Status: AC
  Administered 2013-06-20: 750 mg via INTRAVENOUS
  Filled 2013-06-20: qty 150

## 2013-06-20 NOTE — ED Provider Notes (Signed)
History     CSN: [id number]  Arrival date & time 06/20/13  0142   First MD Initiated Contact with Patient 06/20/13 0240      Chief Complaint  Patient presents with  . Shortness of Breath    (Consider location/radiation/quality/duration/timing/severity/associated sxs/prior treatment) HPI Comments: Melanie Lowe is a 79 y.o. female who states that she began having trouble breathing while sitting in a hot room in her home. Prior to that she had been asleep. She got up to walk outside, then got weak and fell back onto the couch. At that point, she passed out for a few seconds. Family members were with her. She did not have a seizure. There were no injuries. She did not take her usual Valium and Norco tonight. She has mild, low back pain at this time. She also has bilateral upper back, pain, that radiates to her neck. She denies abdominal pain. She has a known abdominal aortic aneurysm, that has been evaluated, but, she is not an operative candidate. No recent fever, chills, nausea, vomiting, cough or shortness of breath. There are no other known modifying factors.  Patient is a 79 y.o. female presenting with shortness of breath. The history is provided by the patient and a relative.  Shortness of Breath   Past Medical History  Diagnosis Date  . COPD (chronic obstructive pulmonary disease)   . Hypertension   . Anxiety   . Depression   . Arthritis   . AAA (abdominal aortic aneurysm)   . Neuropathy   . Leg swelling   . Oxygen dependent   . Myocardial infarction   . Pneumonia     Past Surgical History  Procedure Laterality Date  . Cholecystectomy    . Appendectomy      Family History  Problem Relation Age of Onset  . Diabetes Brother   . Cancer Brother     Throat cancer, Heart attack  . Heart disease Brother   . Heart attack Brother   . Cancer Father   . Cancer Sister     History  Substance Use Topics  . Smoking status: Former Smoker -- 50 years    Types: Cigarettes     Quit date: 12/31/2010  . Smokeless tobacco: Former Neurosurgeon    Quit date: 12/31/2010  . Alcohol Use: No    OB History   Grav Para Term Preterm Abortions TAB SAB Ect Mult Living                  Review of Systems  Respiratory: Positive for shortness of breath.   All other systems reviewed and are negative.    Allergies  Other and Tomato  Home Medications   Current Outpatient Rx  Name  Route  Sig  Dispense  Refill  . albuterol (PROVENTIL) (2.5 MG/3ML) 0.083% nebulizer solution   Nebulization   Take 2.5 mg by nebulization at bedtime. For shortness of breath         . aspirin EC 81 MG tablet   Oral   Take 81 mg by mouth daily.         . budesonide-formoterol (SYMBICORT) 160-4.5 MCG/ACT inhaler   Inhalation   Inhale 2 puffs into the lungs 2 (two) times daily.           . citalopram (CELEXA) 20 MG tablet   Oral   Take 20 mg by mouth at bedtime.          . diazepam (VALIUM) 5 MG tablet  Oral   Take 5 mg by mouth 2 (two) times daily.          . furosemide (LASIX) 40 MG tablet   Oral   Take 40 mg by mouth daily.         Marland Kitchen gabapentin (NEURONTIN) 100 MG tablet   Oral   Take 100 mg by mouth 5 (five) times daily.          Marland Kitchen HYDROcodone-acetaminophen (NORCO) 10-325 MG per tablet   Oral   Take 1 tablet by mouth every 6 (six) hours as needed. For pain         . tiotropium (SPIRIVA) 18 MCG inhalation capsule   Inhalation   Place 18 mcg into inhaler and inhale daily.          Marland Kitchen torsemide (DEMADEX) 20 MG tablet   Oral   Take 20 mg by mouth every morning.          Marland Kitchen levofloxacin (LEVAQUIN) 500 MG tablet   Oral   Take 1 tablet (500 mg total) by mouth daily.   7 tablet   0   . methylPREDNISolone (MEDROL, PAK,) 4 MG tablet      follow package directions   21 tablet   0   . potassium chloride SA (K-DUR,KLOR-CON) 20 MEQ tablet   Oral   Take 1 tablet (20 mEq total) by mouth daily.   3 tablet   0   . predniSONE (DELTASONE) 50 MG tablet    Oral   Take 1 tablet (50 mg total) by mouth daily.   5 tablet   0     BP 111/76  Pulse 69  Temp(Src) 98.7 F (37.1 C) (Oral)  Resp 20  Ht 5\' 1"  (1.549 m)  Wt 147 lb (66.679 kg)  BMI 27.79 kg/m2  SpO2 94%  Physical Exam  Nursing note and vitals reviewed. Constitutional: She is oriented to person, place, and time. She appears well-developed.  Elderly, frail  HENT:  Head: Normocephalic and atraumatic.  Eyes: Conjunctivae and EOM are normal. Pupils are equal, round, and reactive to light.  Neck: Normal range of motion and phonation normal. Neck supple.  Cardiovascular: Normal rate, regular rhythm and intact distal pulses.   Pulmonary/Chest: Effort normal and breath sounds normal. No respiratory distress. She has no wheezes. She has no rales. She exhibits no tenderness.  Abdominal: Soft. She exhibits no distension and no mass. There is no tenderness. There is no guarding.  Musculoskeletal: Normal range of motion.  Neurological: She is alert and oriented to person, place, and time. She has normal strength. She exhibits normal muscle tone.  Skin: Skin is warm and dry.  Psychiatric: She has a normal mood and affect. Her behavior is normal. Judgment and thought content normal.    ED Course  Procedures (including critical care time)  Medications  albuterol (PROVENTIL) (5 MG/ML) 0.5% nebulizer solution 5 mg (5 mg Nebulization Given 06/20/13 0155)  ipratropium (ATROVENT) nebulizer solution 0.5 mg (0.5 mg Nebulization Given 06/20/13 0156)  predniSONE (DELTASONE) tablet 60 mg (60 mg Oral Given 06/20/13 0310)  potassium chloride SA (K-DUR,KLOR-CON) CR tablet 40 mEq (40 mEq Oral Given 06/20/13 0523)  iohexol (OMNIPAQUE) 350 MG/ML injection 100 mL (100 mLs Intravenous Contrast Given 06/20/13 0435)  levofloxacin (LEVAQUIN) IVPB 750 mg (750 mg Intravenous New Bag/Given 06/20/13 0524)    Patient Vitals for the past 24 hrs:  BP Temp Temp src Pulse Resp SpO2 Height Weight  06/20/13 0541 111/76  mmHg - -  69 20 94 % - -  06/20/13 0315 103/61 mmHg - - 82 - - - -  06/20/13 0314 103/64 mmHg - - 78 - - - -  06/20/13 0314 114/71 mmHg - - 69 - - - -  06/20/13 0201 112/69 mmHg 98.7 F (37.1 C) Oral 60 20 92 % 5\' 1"  (1.549 m) 147 lb (66.679 kg)  06/20/13 0156 - - - - - 93 % - -     Date: 06/20/13  Rate: 61  Rhythm: normal sinus rhythm  QRS Axis: normal  PR and QT Intervals: PR prolonged  ST/T Wave abnormalities: normal  PR and QRS Conduction Disutrbances:none  Narrative Interpretation:   Old EKG Reviewed: unchanged- 12/09/12   Labs Reviewed  BASIC METABOLIC PANEL - Abnormal; Notable for the following:    Potassium 3.0 (*)    Chloride 93 (*)    CO2 34 (*)    Glucose, Bld 114 (*)    Creatinine, Ser 1.15 (*)    GFR calc non Af Amer 42 (*)    GFR calc Af Amer 48 (*)    All other components within normal limits  D-DIMER, QUANTITATIVE - Abnormal; Notable for the following:    D-Dimer, Quant 1.05 (*)    All other components within normal limits  CBC WITH DIFFERENTIAL  TROPONIN I   Ct Angio Chest Pe W/cm &/or Wo Cm  06/20/2013   *RADIOLOGY REPORT*  Clinical Data: Back pain between shoulders, history COPD, hypertension, abdominal aortic aneurysm, myocardial infarction  CT ANGIOGRAPHY CHEST  Technique:  Multidetector CT imaging of the chest using the standard protocol during bolus administration of intravenous contrast. Multiplanar reconstructed images including MIPs were obtained and reviewed to evaluate the vascular anatomy.  Contrast: OMNIPAQUE IOHEXOL 350 MG/ML SOLN  Comparison: 11/01/2011  Findings: Extensive atherosclerotic calcifications of the aorta and coronary arteries. Aneurysmal dilatation ascending thoracic aorta 4.5 x 4.5 cm image 45. Low attenuation left adrenal nodule 1.7 x 1.2 cm question adenoma. Remaining visualized portions of upper abdomen unremarkable. Upper normal-sized precarinal lymph node 12 x 10 mm image 35.  Pulmonary arteries patent. No evidence pulmonary  embolism. Scattered peribronchial thickening with minimal right lower lobe bronchiectasis. Atelectasis and chronic accentuation of interstitial markings at bases question scarring versus chronic interstitial disease/fibrosis. New areas of atelectasis, scarring or less likely infiltrate in the upper lobes adjacent to the anterior mediastinum.  No pleural effusion or pneumothorax. Fluid attenuation structure anterior to left shoulder joint consistent with a large cyst measuring 5.1 x 2.1 x 4.2 cm. No acute osseous findings.  IMPRESSION: No evidence of pulmonary embolism. Aneurysm to small dilatation ascending thoracic aorta 4.5 x 4.5 cm. Scattered areas of atelectasis, scarring and question fibrosis lower lobes with minimal bronchiectasis in right lower lobe. Questionable areas of scarring, atelectasis or less likely infiltrate in the anterior upper lobes. Focal fluid collection anterior to the left glenohumeral joint suspect large cyst.   Original Report Authenticated By: Ulyses Southward, M.D.   Dg Chest Portable 1 View  06/20/2013   *RADIOLOGY REPORT*  Clinical Data: Shortness of breath for 1 day, history COPD, hypertension, myocardial infarction, smoking  PORTABLE CHEST - 1 VIEW  Comparison: Portable exam 0224 hours compared to 12/29/2012  Findings: Chronic elevation left diaphragm. Minimal enlargement of cardiac silhouette. Calcified tortuous aorta. Pulmonary vascularity normal. Bibasilar atelectasis versus infiltrate. Upper lungs clear. No pleural effusion or pneumothorax. Bones demineralized.  IMPRESSION: Minimal enlargement of cardiac silhouette. Bibasilar atelectasis versus infiltrate.   Original Report Authenticated By:  Ulyses Southward, M.D.     1. Bronchitis   2. Hypokalemia       MDM  Shortness of breath with syncope. Evaluation for acute coronary and pulmonary problems were negative with the exception of likely bronchitis. Doubt PE, ACS, sepsis, impending respiratory failure, or metabolic instability.  Patient is stable for discharge with outpatient management. Incidental hypokalemia, on diuretic medication. She is apparently not on potassium supplementation, currently. No cardiac arrhythmias in the ED.  Nursing Notes Reviewed/ Care Coordinated, and agree without changes. Applicable Imaging Reviewed.  Interpretation of Laboratory Data incorporated into ED treatment   Plan: Home Medications- potassium, Levaquin; Home Treatments and Observation- rest, and use, regular medications at home; return here if the recommended treatment, does not improve the symptoms; Recommended follow up- follow up with PCP, in one week, sooner if needed, for problems        Flint Melter, MD 06/20/13 [phone]

## 2013-06-20 NOTE — ED Notes (Signed)
Pt sitting up in chair, no family has arrived yet, pt denies any complaints,

## 2013-06-20 NOTE — ED Notes (Signed)
EMS contacted for pt to be transported to residence, all attempts to contact family by phone have been unsuccessful.

## 2013-06-20 NOTE — ED Notes (Signed)
Phone given by pt to contact family for ride home is not working. Pt unable to provide any additional phone numbers for contact.  Also confirmed with pt that she did have electricity and water, pt states that her son has provided the residence with a generator. [employer] contacted to notify family of pt's discharge

## 2013-06-20 NOTE — ED Notes (Signed)
Spoke with [employer] who advised that they would be at pt residence in approximate 15 minutes. Pt notified,

## 2013-06-20 NOTE — ED Notes (Signed)
Pt given breakfast tray

## 2013-06-20 NOTE — ED Notes (Signed)
Pt still waiting on family to arrive, still unable to contact anyone by phone number listed.

## 2013-06-20 NOTE — ED Notes (Signed)
Pt ambulatory to restroom, coffee given per request.

## 2013-06-20 NOTE — ED Notes (Signed)
EMS here for transport

## 2013-06-20 NOTE — ED Notes (Signed)
[employer] called, advised that they had been to residence, spoke with Greig Castilla who advised that he would wake up Melanie Lowe and his fiance and have them come to pick pt up. Pt updated.

## 2013-06-20 NOTE — ED Notes (Signed)
Hx of copd, sob worse tonight, denies cp

## 2013-08-05 ENCOUNTER — Other Ambulatory Visit (HOSPITAL_COMMUNITY): Payer: Self-pay | Admitting: Pulmonary Disease

## 2013-08-05 DIAGNOSIS — N644 Mastodynia: Secondary | ICD-10-CM

## 2013-08-19 ENCOUNTER — Inpatient Hospital Stay (HOSPITAL_COMMUNITY): Admission: RE | Admit: 2013-08-19 | Payer: Medicare HMO | Source: Ambulatory Visit

## 2013-08-26 ENCOUNTER — Other Ambulatory Visit (HOSPITAL_COMMUNITY): Payer: Self-pay | Admitting: Pulmonary Disease

## 2013-08-26 ENCOUNTER — Ambulatory Visit (HOSPITAL_COMMUNITY)
Admission: RE | Admit: 2013-08-26 | Discharge: 2013-08-26 | Disposition: A | Discharge status: Home / Self Care | Payer: Medicare HMO | Source: Ambulatory Visit | Attending: Pulmonary Disease | Admitting: Pulmonary Disease

## 2013-08-26 DIAGNOSIS — N644 Mastodynia: Secondary | ICD-10-CM

## 2013-12-10 ENCOUNTER — Emergency Department (HOSPITAL_COMMUNITY): Payer: Medicare HMO

## 2013-12-10 ENCOUNTER — Encounter (HOSPITAL_COMMUNITY): Payer: Self-pay | Admitting: Emergency Medicine

## 2013-12-10 ENCOUNTER — Emergency Department (HOSPITAL_COMMUNITY)
Admission: EM | Admit: 2013-12-10 | Discharge: 2013-12-11 | Disposition: A | Discharge status: Home / Self Care | Payer: Medicare HMO | Attending: Emergency Medicine | Admitting: Emergency Medicine

## 2013-12-10 DIAGNOSIS — R0789 Other chest pain: Secondary | ICD-10-CM

## 2013-12-10 DIAGNOSIS — Z79899 Other long term (current) drug therapy: Secondary | ICD-10-CM | POA: Insufficient documentation

## 2013-12-10 DIAGNOSIS — F3289 Other specified depressive episodes: Secondary | ICD-10-CM | POA: Insufficient documentation

## 2013-12-10 DIAGNOSIS — J441 Chronic obstructive pulmonary disease with (acute) exacerbation: Secondary | ICD-10-CM | POA: Insufficient documentation

## 2013-12-10 DIAGNOSIS — F411 Generalized anxiety disorder: Secondary | ICD-10-CM | POA: Insufficient documentation

## 2013-12-10 DIAGNOSIS — R197 Diarrhea, unspecified: Secondary | ICD-10-CM

## 2013-12-10 DIAGNOSIS — Z87891 Personal history of nicotine dependence: Secondary | ICD-10-CM | POA: Insufficient documentation

## 2013-12-10 DIAGNOSIS — I10 Essential (primary) hypertension: Secondary | ICD-10-CM | POA: Insufficient documentation

## 2013-12-10 DIAGNOSIS — Z8669 Personal history of other diseases of the nervous system and sense organs: Secondary | ICD-10-CM | POA: Insufficient documentation

## 2013-12-10 DIAGNOSIS — R071 Chest pain on breathing: Secondary | ICD-10-CM | POA: Insufficient documentation

## 2013-12-10 DIAGNOSIS — R110 Nausea: Secondary | ICD-10-CM

## 2013-12-10 DIAGNOSIS — M545 Low back pain, unspecified: Secondary | ICD-10-CM | POA: Insufficient documentation

## 2013-12-10 DIAGNOSIS — Z8701 Personal history of pneumonia (recurrent): Secondary | ICD-10-CM | POA: Insufficient documentation

## 2013-12-10 DIAGNOSIS — Z792 Long term (current) use of antibiotics: Secondary | ICD-10-CM | POA: Insufficient documentation

## 2013-12-10 DIAGNOSIS — M129 Arthropathy, unspecified: Secondary | ICD-10-CM | POA: Insufficient documentation

## 2013-12-10 DIAGNOSIS — Z9981 Dependence on supplemental oxygen: Secondary | ICD-10-CM | POA: Insufficient documentation

## 2013-12-10 DIAGNOSIS — R109 Unspecified abdominal pain: Secondary | ICD-10-CM | POA: Insufficient documentation

## 2013-12-10 DIAGNOSIS — R112 Nausea with vomiting, unspecified: Secondary | ICD-10-CM | POA: Insufficient documentation

## 2013-12-10 DIAGNOSIS — I252 Old myocardial infarction: Secondary | ICD-10-CM | POA: Insufficient documentation

## 2013-12-10 DIAGNOSIS — J40 Bronchitis, not specified as acute or chronic: Secondary | ICD-10-CM

## 2013-12-10 DIAGNOSIS — F329 Major depressive disorder, single episode, unspecified: Secondary | ICD-10-CM | POA: Insufficient documentation

## 2013-12-10 DIAGNOSIS — G8929 Other chronic pain: Secondary | ICD-10-CM | POA: Insufficient documentation

## 2013-12-10 DIAGNOSIS — M546 Pain in thoracic spine: Secondary | ICD-10-CM | POA: Insufficient documentation

## 2013-12-10 DIAGNOSIS — Z7982 Long term (current) use of aspirin: Secondary | ICD-10-CM | POA: Insufficient documentation

## 2013-12-10 DIAGNOSIS — M7989 Other specified soft tissue disorders: Secondary | ICD-10-CM | POA: Insufficient documentation

## 2013-12-10 LAB — COMPREHENSIVE METABOLIC PANEL
ALT: 14 U/L (ref 0–35)
Alkaline Phosphatase: 85 U/L (ref 39–117)
BUN: 25 mg/dL — ABNORMAL HIGH (ref 6–23)
CO2: 29 mEq/L (ref 19–32)
Chloride: 99 mEq/L (ref 96–112)
GFR calc Af Amer: 51 mL/min — ABNORMAL LOW (ref >90)
GFR calc non Af Amer: 44 mL/min — ABNORMAL LOW (ref >90)
Glucose, Bld: 101 mg/dL — ABNORMAL HIGH (ref 70–99)
Potassium: 4.2 mEq/L (ref 3.5–5.1)
Sodium: 136 mEq/L (ref 135–145)
Total Bilirubin: 0.4 mg/dL (ref 0.3–1.2)
Total Protein: 7.4 g/dL (ref 6.0–8.3)

## 2013-12-10 LAB — CBC WITH DIFFERENTIAL/PLATELET
Hemoglobin: 13.7 g/dL (ref 12.0–15.0)
Lymphocytes Relative: 40 % (ref 12–46)
Lymphs Abs: 2.2 10*3/uL (ref 0.7–4.0)
Monocytes Relative: 7 % (ref 3–12)
Neutro Abs: 2.8 10*3/uL (ref 1.7–7.7)
Neutrophils Relative %: 51 % (ref 43–77)
Platelets: 207 10*3/uL (ref 150–400)
RBC: 4.29 MIL/uL (ref 3.87–5.11)
WBC: 5.5 10*3/uL (ref 4.0–10.5)

## 2013-12-10 LAB — POCT I-STAT TROPONIN I

## 2013-12-10 MED ORDER — PREDNISONE 20 MG PO TABS: ORAL_TABLET | ORAL | Status: DC

## 2013-12-10 MED ORDER — IPRATROPIUM BROMIDE 0.02 % IN SOLN
RESPIRATORY_TRACT | Status: AC
  Filled 2013-12-10: qty 2.5

## 2013-12-10 MED ORDER — ONDANSETRON HCL 4 MG PO TABS: 4.0000 mg | ORAL_TABLET | Freq: Four times a day (QID) | ORAL | Status: DC

## 2013-12-10 MED ORDER — ALBUTEROL SULFATE (5 MG/ML) 0.5% IN NEBU
5.0000 mg | INHALATION_SOLUTION | Freq: Once | RESPIRATORY_TRACT | Status: AC
  Administered 2013-12-10: 5 mg via RESPIRATORY_TRACT
  Filled 2013-12-10: qty 1

## 2013-12-10 MED ORDER — IPRATROPIUM BROMIDE 0.02 % IN SOLN
0.5000 mg | Freq: Once | RESPIRATORY_TRACT | Status: AC
  Administered 2013-12-10: 0.5 mg via RESPIRATORY_TRACT
  Filled 2013-12-10: qty 2.5

## 2013-12-10 MED ORDER — ASPIRIN 81 MG PO CHEW
324.0000 mg | CHEWABLE_TABLET | Freq: Once | ORAL | Status: AC
  Administered 2013-12-10: 324 mg via ORAL
  Filled 2013-12-10: qty 4

## 2013-12-10 MED ORDER — NITROGLYCERIN 2 % TD OINT
1.0000 [in_us] | TOPICAL_OINTMENT | Freq: Once | TRANSDERMAL | Status: AC
  Administered 2013-12-10: 1 [in_us] via TOPICAL
  Filled 2013-12-10: qty 1

## 2013-12-10 MED ORDER — SULFAMETHOXAZOLE-TMP DS 800-160 MG PO TABS: 1.0000 | ORAL_TABLET | Freq: Two times a day (BID) | ORAL | Status: DC

## 2013-12-10 MED ORDER — TRAMADOL HCL 50 MG PO TABS: ORAL_TABLET | ORAL | Status: DC

## 2013-12-10 MED ORDER — MORPHINE SULFATE 4 MG/ML IJ SOLN
4.0000 mg | Freq: Once | INTRAMUSCULAR | Status: AC
  Administered 2013-12-10: 4 mg via INTRAVENOUS
  Filled 2013-12-10: qty 1

## 2013-12-10 MED ORDER — METHYLPREDNISOLONE SODIUM SUCC 125 MG IJ SOLR
125.0000 mg | Freq: Once | INTRAMUSCULAR | Status: AC
  Administered 2013-12-10: 125 mg via INTRAVENOUS
  Filled 2013-12-10: qty 2

## 2013-12-10 NOTE — ED Notes (Signed)
Pt arrived from home by EMS. Complaining of chest pain & left rib pain, pt denies any injury. Pt states she is SOB also.

## 2013-12-10 NOTE — ED Provider Notes (Signed)
CSN: [id number]     Arrival date & time 12/10/13  2045 History   First MD Initiated Contact with Patient 12/10/13 2046   This chart was scribed for Ward Givens, MD by Valera Castle, ED Scribe. This patient was seen in room [facility] and the patient's care was started at 8:49 PM.   Chief Complaint  Patient presents with  . Chest Pain   The history is provided by the patient. No language interpreter was used.   HPI Comments: Melanie Lowe is a 79 y.o. female brought in by EMS who presents to the Emergency Department complaining of sudden, intermittent, central chest pain, with episodes that last about 3 hours, onset this morning after waking up. She states the worst pain was at about 4 pm today.  She reports nothing exacerbates her chest pain. She denies anything relieving her chest pain. She denies h/o similar chest pain. She reports associated SOB today. She reports cough, onset 2-3 days ago, productive of white sputum, along with wheezing. She reports nausea, without vomiting,  stating she hasn't eaten yet today. She reports diarrhea, onset 1 month ago. She reports being started on antibiotics 2 weeks ago, for a cough. She reports using nebulizer, with some relief today of her SOB and her chest pain. She reports bilateral feet swelling. She reports being on 3 LPM of O2 day and night.   She reports left lower chest pain, stating she felt something pop in the area while coughing hard while in the ambulance on the way over to the [facility]. She reports h/o lower back pain, but reports current upper back pain around her shoulders.   She denies chills, and any other associated symptoms. She reports h/o smoking, but reports quitting smoking 2 years ago, but states that there are people who smoke in her house. She reports living with her son. She reports being hospitalized at [facility]-40 years ago for heart evaluation that was negative. She reports family history of heart problems in 2 of her brothers, DM, and  cancer. She reports [place] heritage, stating her father was full blood [place]. She reports he died due to cancer.   PCP - Fredirick Maudlin, MD  Past Medical History  Diagnosis Date  . COPD (chronic obstructive pulmonary disease)   . Hypertension   . Anxiety   . Depression   . Arthritis   . AAA (abdominal aortic aneurysm)   . Neuropathy   . Leg swelling   . Oxygen dependent   . Myocardial infarction   . Pneumonia    Past Surgical History  Procedure Laterality Date  . Cholecystectomy    . Appendectomy     Family History  Problem Relation Age of Onset  . Diabetes Brother   . Cancer Brother     Throat cancer, Heart attack  . Heart disease Brother   . Heart attack Brother   . Cancer Father   . Cancer Sister    History  Substance Use Topics  . Smoking status: Former Smoker -- 50 years    Types: Cigarettes    Quit date: 12/31/2010  . Smokeless tobacco: Former Neurosurgeon    Quit date: 12/31/2010  . Alcohol Use: No   + second hand smoke Lives with son Oxygen 3 lpm [place]  OB History   Grav Para Term Preterm Abortions TAB SAB Ect Mult Living                 Review of Systems  Constitutional:  Negative for chills.  Respiratory: Positive for cough (productive of white sputum), shortness of breath and wheezing.   Cardiovascular: Positive for chest pain and leg swelling (bilateral feet).  Gastrointestinal: Positive for nausea, vomiting and diarrhea.  Musculoskeletal: Positive for back pain (upper (new), lower (chronic)).       Positive for left flank pain.  All other systems reviewed and are negative.   Allergies  Other and Tomato  Home Medications   Current Outpatient Rx  Name  Route  Sig  Dispense  Refill  . albuterol (PROVENTIL) (2.5 MG/3ML) 0.083% nebulizer solution   Nebulization   Take 2.5 mg by nebulization at bedtime. For shortness of breath         . aspirin EC 81 MG tablet   Oral   Take 81 mg by mouth daily.         . budesonide-formoterol  (SYMBICORT) 160-4.5 MCG/ACT inhaler   Inhalation   Inhale 2 puffs into the lungs 2 (two) times daily.           . citalopram (CELEXA) 20 MG tablet   Oral   Take 20 mg by mouth at bedtime.          . diazepam (VALIUM) 5 MG tablet   Oral   Take 5 mg by mouth 2 (two) times daily.          . furosemide (LASIX) 40 MG tablet   Oral   Take 40 mg by mouth daily.         Marland Kitchen gabapentin (NEURONTIN) 100 MG tablet   Oral   Take 100 mg by mouth 5 (five) times daily.          Marland Kitchen HYDROcodone-acetaminophen (NORCO) 10-325 MG per tablet   Oral   Take 1 tablet by mouth every 6 (six) hours as needed. For pain         . levofloxacin (LEVAQUIN) 500 MG tablet   Oral   Take 1 tablet (500 mg total) by mouth daily.   7 tablet   0   . potassium chloride SA (K-DUR,KLOR-CON) 20 MEQ tablet   Oral   Take 1 tablet (20 mEq total) by mouth daily.   3 tablet   0   . tiotropium (SPIRIVA) 18 MCG inhalation capsule   Inhalation   Place 18 mcg into inhaler and inhale daily.          Marland Kitchen torsemide (DEMADEX) 20 MG tablet   Oral   Take 20 mg by mouth every morning.           BP 130/82  Pulse 60  Temp(Src) 97.9 F (36.6 C) (Oral)  Wt 150 lb (68.04 kg)  SpO2 96%  Vital signs normal    Physical Exam  Nursing note and vitals reviewed. Constitutional: She is oriented to person, place, and time. She appears well-developed and well-nourished.  Non-toxic appearance. She does not appear ill. No distress.  HENT:  Head: Normocephalic and atraumatic.  Right Ear: External ear normal.  Left Ear: External ear normal.  Nose: Nose normal. No mucosal edema or rhinorrhea.  Mouth/Throat: Oropharynx is clear and moist and mucous membranes are normal. No dental abscesses or uvula swelling.  Eyes: Conjunctivae and EOM are normal. Pupils are equal, round, and reactive to light.  Neck: Normal range of motion and full passive range of motion without pain. Neck supple.  Cardiovascular: Normal rate, regular  rhythm and normal heart sounds.  Exam reveals no gallop and no friction rub.  No murmur heard. Pulmonary/Chest: Effort normal. No respiratory distress. She has no wheezes. She has no rhonchi. She has no rales. She exhibits no tenderness and no crepitus.  Diminished breath sounds. Pigeon chest.   Abdominal: Soft. Normal appearance and bowel sounds are normal. She exhibits no distension. There is no tenderness. There is no rebound and no guarding.  Musculoskeletal: Normal range of motion. She exhibits no edema and no tenderness.  Moves all extremities well.   Neurological: She is alert and oriented to person, place, and time. She has normal strength. No cranial nerve deficit.  Skin: Skin is warm, dry and intact. No rash noted. No erythema. No pallor.  Psychiatric: She has a normal mood and affect. Her speech is normal and behavior is normal. Her mood appears not anxious.    ED Course  Procedures (including critical care time) Medications  ipratropium (ATROVENT) 0.02 % nebulizer solution (not administered)  albuterol (PROVENTIL) (5 MG/ML) 0.5% nebulizer solution 5 mg (5 mg Nebulization Given 12/10/13 2204)  ipratropium (ATROVENT) nebulizer solution 0.5 mg (0.5 mg Nebulization Given 12/10/13 2204)  methylPREDNISolone sodium succinate (SOLU-MEDROL) 125 mg/2 mL injection 125 mg (125 mg Intravenous Given 12/10/13 2110)  morphine 4 MG/ML injection 4 mg (4 mg Intravenous Given 12/10/13 2114)  nitroGLYCERIN (NITROGLYN) 2 % ointment 1 inch (1 inch Topical Given 12/10/13 2114)  aspirin chewable tablet 324 mg (324 mg Oral Given 12/10/13 2108)     DIAGNOSTIC STUDIES: Oxygen Saturation is 96% on room air, normal by my interpretation.    COORDINATION OF CARE: 9:00 PM-Discussed treatment plan which includes CXR with pt at bedside and pt agreed to plan.   10:38 PM - Recheck - Discussed with pt that all her tests are normal, including negative for heart attack, fractures, and pneumonia.   Labs  Review Results for orders placed during the hospital encounter of 12/10/13  CBC WITH DIFFERENTIAL      Result Value Range   WBC 5.5  4.0 - 10.5 K/uL   RBC 4.29  3.87 - 5.11 MIL/uL   Hemoglobin 13.7  12.0 - 15.0 g/dL   HCT 16.1  09.6 - 04.5 %   MCV 96.3  78.0 - 100.0 fL   MCH 31.9  26.0 - 34.0 pg   MCHC 33.2  30.0 - 36.0 g/dL   RDW 40.9  81.1 - 91.4 %   Platelets 207  150 - 400 K/uL   Neutrophils Relative % 51  43 - 77 %   Neutro Abs 2.8  1.7 - 7.7 K/uL   Lymphocytes Relative 40  12 - 46 %   Lymphs Abs 2.2  0.7 - 4.0 K/uL   Monocytes Relative 7  3 - 12 %   Monocytes Absolute 0.4  0.1 - 1.0 K/uL   Eosinophils Relative 1  0 - 5 %   Eosinophils Absolute 0.1  0.0 - 0.7 K/uL   Basophils Relative 0  0 - 1 %   Basophils Absolute 0.0  0.0 - 0.1 K/uL  COMPREHENSIVE METABOLIC PANEL      Result Value Range   Sodium 136  135 - 145 mEq/L   Potassium 4.2  3.5 - 5.1 mEq/L   Chloride 99  96 - 112 mEq/L   CO2 29  19 - 32 mEq/L   Glucose, Bld 101 (*) 70 - 99 mg/dL   BUN 25 (*) 6 - 23 mg/dL   Creatinine, Ser 7.82  0.50 - 1.10 mg/dL   Calcium 9.6  8.4 - 95.6 mg/dL  Total Protein 7.4  6.0 - 8.3 g/dL   Albumin 3.8  3.5 - 5.2 g/dL   AST 18  0 - 37 U/L   ALT 14  0 - 35 U/L   Alkaline Phosphatase 85  39 - 117 U/L   Total Bilirubin 0.4  0.3 - 1.2 mg/dL   GFR calc non Af Amer 44 (*) >90 mL/min   GFR calc Af Amer 51 (*) >90 mL/min  TROPONIN I      Result Value Range   Troponin I <0.30  <0.30 ng/mL  PRO B NATRIURETIC PEPTIDE      Result Value Range   Pro B Natriuretic peptide (BNP) 108.9  0 - 450 pg/mL  POCT I-STAT TROPONIN I      Result Value Range   Troponin i, poc 0.00  0.00 - 0.08 ng/mL   Comment 3             Laboratory interpretation all normal    Imaging Review Dg Ribs Unilateral W/chest Left  12/10/2013   CLINICAL DATA:  Central chest pain and cough; acute left lateral chest pain and felt pop.  EXAM: LEFT RIBS AND CHEST - 3+ VIEW  COMPARISON:  Chest radiograph, and CTA of the  chest performed 06/20/2013  FINDINGS: No displaced rib fractures are seen.  The lungs are well-aerated. Mild bibasilar airspace opacities may reflect chronic atelectasis or scarring. No pleural effusion or pneumothorax is seen. There is mild elevation of the left hemidiaphragm.  The cardiomediastinal silhouette is mildly enlarged; calcification is noted within the aortic arch. No acute osseous abnormalities are seen.  IMPRESSION: No displaced rib fracture seen. Mild bibasilar airspace opacities may reflect chronic atelectasis or scarring. Mild cardiomegaly.   Electronically Signed   By: Roanna Raider M.D.   On: 12/10/2013 21:50    EKG Interpretation    Date/Time:  Thursday December 10 2013 20:49:37 EST Ventricular Rate:  56 PR Interval:  200 QRS Duration: 90 QT Interval:  438 QTC Calculation: 422 R Axis:   10 Text Interpretation:  Sinus bradycardia Otherwise normal ECG When compared with ECG of 20-Jun-2013 01:52, No significant change was found Confirmed by Yamaris Cummings  MD-I, Jaivian Battaglini ([id number]) on 12/10/2013 10:08:42 PM           .medthis  MDM   1. Bronchitis   2. Atypical chest pain   3. Nausea   4. Diarrhea   5. Chest wall pain     New Prescriptions   ONDANSETRON (ZOFRAN) 4 MG TABLET    Take 1 tablet (4 mg total) by mouth every 6 (six) hours.   PREDNISONE (DELTASONE) 20 MG TABLET    Take 3 po QD x 2d starting tomorrow, then 2 po QD x 3d then 1 po QD x 3d   SULFAMETHOXAZOLE-TRIMETHOPRIM (BACTRIM DS) 800-160 MG PER TABLET    Take 1 tablet by mouth 2 (two) times daily.   TRAMADOL (ULTRAM) 50 MG TABLET    Take 1 or 2 po Q 6hrs for pain    Plan discharge  Devoria Albe, MD, FACEP   I personally performed the services described in this documentation, which was scribed in my presence. The recorded information has been reviewed and considered.  Devoria Albe, MD, Armando Gang    Ward Givens, MD 12/10/13 [phone]

## 2013-12-11 NOTE — ED Notes (Signed)
EMS called for a return ride home.

## 2013-12-11 NOTE — ED Notes (Signed)
Pt transported by EMS back home, Family aware pt is coming back by EMS.

## 2014-01-24 ENCOUNTER — Emergency Department (HOSPITAL_COMMUNITY): Payer: Medicare HMO

## 2014-01-24 ENCOUNTER — Observation Stay (HOSPITAL_COMMUNITY)
Admission: EM | Admit: 2014-01-24 | Discharge: 2014-01-26 | Disposition: A | Discharge status: Home / Self Care | Payer: Medicare HMO | Attending: Pulmonary Disease | Admitting: Pulmonary Disease

## 2014-01-24 ENCOUNTER — Encounter (HOSPITAL_COMMUNITY): Payer: Self-pay | Admitting: Emergency Medicine

## 2014-01-24 DIAGNOSIS — R042 Hemoptysis: Secondary | ICD-10-CM

## 2014-01-24 DIAGNOSIS — F329 Major depressive disorder, single episode, unspecified: Secondary | ICD-10-CM | POA: Insufficient documentation

## 2014-01-24 DIAGNOSIS — R059 Cough, unspecified: Secondary | ICD-10-CM | POA: Insufficient documentation

## 2014-01-24 DIAGNOSIS — I517 Cardiomegaly: Secondary | ICD-10-CM | POA: Insufficient documentation

## 2014-01-24 DIAGNOSIS — R0602 Shortness of breath: Secondary | ICD-10-CM | POA: Diagnosis present

## 2014-01-24 DIAGNOSIS — F411 Generalized anxiety disorder: Secondary | ICD-10-CM

## 2014-01-24 DIAGNOSIS — N179 Acute kidney failure, unspecified: Secondary | ICD-10-CM

## 2014-01-24 DIAGNOSIS — R05 Cough: Secondary | ICD-10-CM | POA: Insufficient documentation

## 2014-01-24 DIAGNOSIS — I6789 Other cerebrovascular disease: Secondary | ICD-10-CM | POA: Insufficient documentation

## 2014-01-24 DIAGNOSIS — F3289 Other specified depressive episodes: Secondary | ICD-10-CM | POA: Insufficient documentation

## 2014-01-24 DIAGNOSIS — J4489 Other specified chronic obstructive pulmonary disease: Secondary | ICD-10-CM

## 2014-01-24 DIAGNOSIS — J9611 Chronic respiratory failure with hypoxia: Secondary | ICD-10-CM | POA: Diagnosis present

## 2014-01-24 DIAGNOSIS — G319 Degenerative disease of nervous system, unspecified: Secondary | ICD-10-CM | POA: Insufficient documentation

## 2014-01-24 DIAGNOSIS — I252 Old myocardial infarction: Secondary | ICD-10-CM | POA: Insufficient documentation

## 2014-01-24 DIAGNOSIS — Z7982 Long term (current) use of aspirin: Secondary | ICD-10-CM | POA: Insufficient documentation

## 2014-01-24 DIAGNOSIS — E860 Dehydration: Secondary | ICD-10-CM | POA: Insufficient documentation

## 2014-01-24 DIAGNOSIS — R270 Ataxia, unspecified: Secondary | ICD-10-CM

## 2014-01-24 DIAGNOSIS — I10 Essential (primary) hypertension: Secondary | ICD-10-CM | POA: Insufficient documentation

## 2014-01-24 DIAGNOSIS — J449 Chronic obstructive pulmonary disease, unspecified: Secondary | ICD-10-CM

## 2014-01-24 DIAGNOSIS — R079 Chest pain, unspecified: Secondary | ICD-10-CM | POA: Diagnosis present

## 2014-01-24 DIAGNOSIS — R42 Dizziness and giddiness: Principal | ICD-10-CM

## 2014-01-24 LAB — BASIC METABOLIC PANEL
BUN: 26 mg/dL — ABNORMAL HIGH (ref 6–23)
CALCIUM: 9.7 mg/dL (ref 8.4–10.5)
CO2: 28 mEq/L (ref 19–32)
Chloride: 99 mEq/L (ref 96–112)
Creatinine, Ser: 1.43 mg/dL — ABNORMAL HIGH (ref 0.50–1.10)
GFR calc non Af Amer: 32 mL/min — ABNORMAL LOW (ref >90)
GFR, EST AFRICAN AMERICAN: 37 mL/min — AB (ref >90)
GLUCOSE: 99 mg/dL (ref 70–99)
Potassium: 4.4 mEq/L (ref 3.7–5.3)
Sodium: 137 mEq/L (ref 137–147)

## 2014-01-24 LAB — CBC WITH DIFFERENTIAL/PLATELET
Basophils Absolute: 0 10*3/uL (ref 0.0–0.1)
Basophils Relative: 0 % (ref 0–1)
EOS ABS: 0.1 10*3/uL (ref 0.0–0.7)
EOS PCT: 2 % (ref 0–5)
HCT: 44.1 % (ref 36.0–46.0)
Hemoglobin: 14.7 g/dL (ref 12.0–15.0)
LYMPHS ABS: 1.6 10*3/uL (ref 0.7–4.0)
Lymphocytes Relative: 31 % (ref 12–46)
MCH: 31.9 pg (ref 26.0–34.0)
MCHC: 33.3 g/dL (ref 30.0–36.0)
MCV: 95.7 fL (ref 78.0–100.0)
Monocytes Absolute: 0.5 10*3/uL (ref 0.1–1.0)
Monocytes Relative: 10 % (ref 3–12)
NEUTROS PCT: 56 % (ref 43–77)
Neutro Abs: 2.9 10*3/uL (ref 1.7–7.7)
Platelets: 227 10*3/uL (ref 150–400)
RBC: 4.61 MIL/uL (ref 3.87–5.11)
RDW: 14.2 % (ref 11.5–15.5)
WBC: 5.2 10*3/uL (ref 4.0–10.5)

## 2014-01-24 LAB — TROPONIN I
Troponin I: <0.3 ng/mL (ref <0.30)
Troponin I: <0.3 ng/mL (ref <0.30)

## 2014-01-24 LAB — PRO B NATRIURETIC PEPTIDE: PRO B NATRI PEPTIDE: 112.5 pg/mL (ref 0–450)

## 2014-01-24 MED ORDER — ALBUTEROL SULFATE (2.5 MG/3ML) 0.083% IN NEBU: 2.5000 mg | INHALATION_SOLUTION | RESPIRATORY_TRACT | Status: DC | PRN

## 2014-01-24 MED ORDER — IPRATROPIUM BROMIDE 0.02 % IN SOLN: 0.5000 mg | Freq: Once | RESPIRATORY_TRACT | Status: DC

## 2014-01-24 MED ORDER — SODIUM CHLORIDE 0.9 % IV SOLN
INTRAVENOUS | Status: AC
  Administered 2014-01-24: 23:00:00 via INTRAVENOUS

## 2014-01-24 MED ORDER — ALBUTEROL SULFATE (2.5 MG/3ML) 0.083% IN NEBU: 2.5000 mg | INHALATION_SOLUTION | Freq: Four times a day (QID) | RESPIRATORY_TRACT | Status: DC | PRN

## 2014-01-24 MED ORDER — SODIUM CHLORIDE 0.9 % IJ SOLN
3.0000 mL | Freq: Two times a day (BID) | INTRAMUSCULAR | Status: DC
  Administered 2014-01-24 – 2014-01-25 (×2): 3 mL via INTRAVENOUS

## 2014-01-24 MED ORDER — AZITHROMYCIN 250 MG PO TABS
250.0000 mg | ORAL_TABLET | Freq: Every day | ORAL | Status: DC
  Administered 2014-01-26: 250 mg via ORAL
  Filled 2014-01-24: qty 1

## 2014-01-24 MED ORDER — SODIUM CHLORIDE 0.9 % IV SOLN: INTRAVENOUS | Status: DC

## 2014-01-24 MED ORDER — ONDANSETRON HCL 4 MG/2ML IJ SOLN
4.0000 mg | Freq: Once | INTRAMUSCULAR | Status: AC
  Administered 2014-01-24: 4 mg via INTRAVENOUS
  Filled 2014-01-24: qty 2

## 2014-01-24 MED ORDER — IPRATROPIUM BROMIDE 0.02 % IN SOLN: 0.5000 mg | Freq: Four times a day (QID) | RESPIRATORY_TRACT | Status: DC

## 2014-01-24 MED ORDER — SODIUM CHLORIDE 0.9 % IV BOLUS (SEPSIS)
500.0000 mL | Freq: Once | INTRAVENOUS | Status: AC
  Administered 2014-01-24: 500 mL via INTRAVENOUS

## 2014-01-24 MED ORDER — OXYCODONE-ACETAMINOPHEN 5-325 MG PO TABS
2.0000 | ORAL_TABLET | Freq: Once | ORAL | Status: AC
  Administered 2014-01-24: 2 via ORAL
  Filled 2014-01-24: qty 2

## 2014-01-24 MED ORDER — IPRATROPIUM-ALBUTEROL 0.5-2.5 (3) MG/3ML IN SOLN
3.0000 mL | Freq: Once | RESPIRATORY_TRACT | Status: AC
  Administered 2014-01-24: 3 mL via RESPIRATORY_TRACT
  Filled 2014-01-24: qty 3

## 2014-01-24 MED ORDER — ASPIRIN EC 81 MG PO TBEC
81.0000 mg | DELAYED_RELEASE_TABLET | Freq: Every day | ORAL | Status: DC
  Administered 2014-01-25 – 2014-01-26 (×2): 81 mg via ORAL
  Filled 2014-01-24 (×2): qty 1

## 2014-01-24 MED ORDER — ALBUTEROL SULFATE (2.5 MG/3ML) 0.083% IN NEBU: 5.0000 mg | INHALATION_SOLUTION | Freq: Once | RESPIRATORY_TRACT | Status: DC

## 2014-01-24 MED ORDER — BUDESONIDE-FORMOTEROL FUMARATE 160-4.5 MCG/ACT IN AERO
2.0000 | INHALATION_SPRAY | Freq: Two times a day (BID) | RESPIRATORY_TRACT | Status: DC
  Administered 2014-01-25 – 2014-01-26 (×3): 2 via RESPIRATORY_TRACT
  Filled 2014-01-24: qty 6

## 2014-01-24 MED ORDER — GUAIFENESIN-DM 100-10 MG/5ML PO SYRP: 5.0000 mL | ORAL_SOLUTION | ORAL | Status: DC | PRN

## 2014-01-24 MED ORDER — AZITHROMYCIN 250 MG PO TABS
500.0000 mg | ORAL_TABLET | Freq: Every day | ORAL | Status: AC
  Administered 2014-01-25: 500 mg via ORAL
  Filled 2014-01-24: qty 2

## 2014-01-24 MED ORDER — CITALOPRAM HYDROBROMIDE 20 MG PO TABS
20.0000 mg | ORAL_TABLET | Freq: Every day | ORAL | Status: DC
  Administered 2014-01-24 – 2014-01-25 (×2): 20 mg via ORAL
  Filled 2014-01-24 (×2): qty 1

## 2014-01-24 MED ORDER — ALBUTEROL SULFATE (2.5 MG/3ML) 0.083% IN NEBU: 2.5000 mg | INHALATION_SOLUTION | Freq: Four times a day (QID) | RESPIRATORY_TRACT | Status: DC

## 2014-01-24 MED ORDER — PREDNISONE 50 MG PO TABS
60.0000 mg | ORAL_TABLET | Freq: Once | ORAL | Status: AC
  Administered 2014-01-24: 60 mg via ORAL
  Filled 2014-01-24 (×2): qty 1

## 2014-01-24 MED ORDER — GABAPENTIN 100 MG PO CAPS
100.0000 mg | ORAL_CAPSULE | Freq: Three times a day (TID) | ORAL | Status: DC
  Administered 2014-01-24 – 2014-01-26 (×5): 100 mg via ORAL
  Filled 2014-01-24 (×5): qty 1

## 2014-01-24 MED ORDER — ALBUTEROL SULFATE (2.5 MG/3ML) 0.083% IN NEBU
2.5000 mg | INHALATION_SOLUTION | Freq: Once | RESPIRATORY_TRACT | Status: AC
  Administered 2014-01-24: 2.5 mg via RESPIRATORY_TRACT
  Filled 2014-01-24: qty 3

## 2014-01-24 MED ORDER — IPRATROPIUM-ALBUTEROL 0.5-2.5 (3) MG/3ML IN SOLN
3.0000 mL | Freq: Four times a day (QID) | RESPIRATORY_TRACT | Status: DC
  Administered 2014-01-24: 3 mL via RESPIRATORY_TRACT
  Filled 2014-01-24: qty 3

## 2014-01-24 MED ORDER — TIOTROPIUM BROMIDE MONOHYDRATE 18 MCG IN CAPS
18.0000 ug | ORAL_CAPSULE | Freq: Every day | RESPIRATORY_TRACT | Status: DC
  Administered 2014-01-25 – 2014-01-26 (×2): 18 ug via RESPIRATORY_TRACT
  Filled 2014-01-24: qty 5

## 2014-01-24 NOTE — ED Notes (Signed)
Family leaving if pt is discharge call home number & they will try to find a ride to pick her up.

## 2014-01-24 NOTE — ED Notes (Signed)
Pt ambulated w/ out O2 83% HR 91.

## 2014-01-24 NOTE — ED Provider Notes (Signed)
CSN: [id number]     Arrival date & time 01/24/14  1526 History  This chart was scribed for Joya Gaskinsonald W Athalia Setterlund, MD by Quintella ReichertMatthew Underwood, ED scribe.  This patient was seen in room [facility] and the patient's care was started at 3:47 PM.   Chief Complaint  Patient presents with  . Shortness of Breath  . Cough    Patient is a 80 y.o. female presenting with shortness of breath. The history is provided by the patient. No language interpreter was used.  Shortness of Breath Severity:  Moderate Timing:  Intermittent Chronicity:  Chronic (but worsened) Ineffective treatments:  None tried Associated symptoms: chest pain (only on coughing), cough and hemoptysis   Associated symptoms: no fever   Risk factors comment:  H/o COPD  HPI Comments: Melanie Musicda S Coach is a 80 y.o. female with h/o COPD who presents to the Emergency Department complaining of several hours of productive cough with associated intermittent SOB.  Pt states she woke up at 1 PM coughing up yellow sputum.  She has also noticed bright red blood "all over the napkin" several times while coughing.  She additionally states she has had some intermittent moderate SOB.  Pt complains of central CP on coughing but denies otherwise.  She denies fever, leg swelling, abdominal pain, or epistaxis.  She states "I think I have pneumonia" and notes she has had it in the past.  Pt is a former smoker.  She is on 3L oxygen at home.  She is not on blood-thinners.  She denies other recent increased SOB before today.  She states she is able to walk without excessive SOB.  She denies orthopnea.   Past Medical History  Diagnosis Date  . COPD (chronic obstructive pulmonary disease)   . Hypertension   . Anxiety   . Depression   . Arthritis   . AAA (abdominal aortic aneurysm)   . Neuropathy   . Leg swelling   . Oxygen dependent   . Myocardial infarction   . Pneumonia     Past Surgical History  Procedure Laterality Date  . Cholecystectomy    . Appendectomy       Family History  Problem Relation Age of Onset  . Diabetes Brother   . Cancer Brother     Throat cancer, Heart attack  . Heart disease Brother   . Heart attack Brother   . Cancer Father   . Cancer Sister     History  Substance Use Topics  . Smoking status: Former Smoker -- 50 years    Types: Cigarettes    Quit date: 12/31/2010  . Smokeless tobacco: Former NeurosurgeonUser    Quit date: 12/31/2010  . Alcohol Use: No    OB History   Grav Para Term Preterm Abortions TAB SAB Ect Mult Living                  Review of Systems  Constitutional: Negative for fever.  HENT: Negative for nosebleeds.   Respiratory: Positive for cough, hemoptysis and shortness of breath.   Cardiovascular: Positive for chest pain (only on coughing).  All other systems reviewed and are negative.     Allergies  Other and Tomato  Home Medications   Current Outpatient Rx  Name  Route  Sig  Dispense  Refill  . albuterol (PROVENTIL) (2.5 MG/3ML) 0.083% nebulizer solution   Nebulization   Take 2.5 mg by nebulization every 6 (six) hours as needed for wheezing or shortness of breath.         .Marland Kitchen  aspirin EC 81 MG tablet   Oral   Take 81 mg by mouth daily.         . budesonide-formoterol (SYMBICORT) 160-4.5 MCG/ACT inhaler   Inhalation   Inhale 2 puffs into the lungs 2 (two) times daily.           . citalopram (CELEXA) 20 MG tablet   Oral   Take 20 mg by mouth at bedtime.          . gabapentin (NEURONTIN) 100 MG tablet   Oral   Take 100 mg by mouth 3 (three) times daily.          Marland Kitchen sulfamethoxazole-trimethoprim (BACTRIM DS) 800-160 MG per tablet   Oral   Take 1 tablet by mouth 2 (two) times daily.   20 tablet   0   . tiotropium (SPIRIVA) 18 MCG inhalation capsule   Inhalation   Place 18 mcg into inhaler and inhale daily.          Marland Kitchen torsemide (DEMADEX) 20 MG tablet   Oral   Take 20 mg by mouth every morning.           BP 125/72  Pulse 73  Temp(Src) 97.9 F (36.6 C)  (Oral)  Resp 25  SpO2 90%   Physical Exam  Nursing note and vitals reviewed. CONSTITUTIONAL: Well developed/well nourished HEAD: Normocephalic/atraumatic EYES: EOMI/PERRL ENMT: Mucous membranes moist NECK: supple no meningeal signs SPINE:entire spine nontender CV: S1/S2 noted, no murmurs/rubs/gallops noted LUNGS:  no apparent distress  Coarse breath sounds bilaterally ABDOMEN: soft, nontender, no rebound or guarding GU:no cva tenderness NEURO: Pt is awake/alert, moves all extremitiesx4, No arm/leg drift.  No facial droop EXTREMITIES: pulses normal, full ROM, no edema to lower extremities SKIN: warm, color normal PSYCH: no abnormalities of mood noted    ED Course  Procedures   DIAGNOSTIC STUDIES: Oxygen Saturation is 90% on room air, low by my interpretation.    COORDINATION OF CARE: 3:51 PM-Discussed treatment plan which includes EKG, CXR and labs with pt at bedside and pt agreed to plan.    7:10 PM Pt appears improved.  She is in not in resp distress. She denies active CP However upon movement/standing she reports intense vertigo and is unable to walk She reports this has been present intermittently over past day, unclear time of onset Will obtain CT head 8:28 PM Will admit for monitoring as she is high fall risk Will give IV fluids for rehydration Due to ataxia upon ambulation, will likely need MRI in AM No signs of acute CVA at this time D/w dr Onalee Hua, will admit for dr Juanetta Gosling Labs Review Labs Reviewed  CBC WITH DIFFERENTIAL  BASIC METABOLIC PANEL  TROPONIN I   Imaging Review No results found.  EKG Interpretation    Date/Time:  Sunday January 24 2014 15:47:29 EST Ventricular Rate:  55 PR Interval:  188 QRS Duration: 86 QT Interval:  450 QTC Calculation: 430 R Axis:   0 Text Interpretation:  Sinus bradycardia Borderline ECG When compared with ECG of 10-Dec-2013 20:49, No significant change was found Confirmed by Bebe Shaggy  MD, Modine Oppenheimer [phone]) on  01/24/2014 4:39:20 PM            MDM  No diagnosis found. Nursing notes including past medical history and social history reviewed and considered in documentation xrays reviewed and considered Labs/vital reviewed and considered Previous records reviewed and considered     I personally performed the services described in this documentation, which was scribed in  my presence. The recorded information has been reviewed and is accurate.      Joya Gaskins, MD 01/24/14 2030

## 2014-01-24 NOTE — ED Notes (Signed)
Pt comes from home via EMS with c/o intermittent SOB and cough. Pt states she began coughing earlier this afternoon. Pt reports bright red blood in sputum. Pt reports pain in chest while coughing but denies otherwise.

## 2014-01-24 NOTE — H&P (Signed)
PCP:   Fredirick Maudlin, MD   Chief Complaint:  Sob, cough  HPI: 80 yo female h/o copd on 3.5 L [place] oxygen cont at home, htn, anxiety comes in with sudden onset of coughing, sob with some mild hemoptysis that lasted couple of hours.  Pt came to ED and symptoms relieved with nebs in ED.  No fevers.  Has end stage copd.  She was having some sscp with coughing only.  This is gone now.  She feels much better.  No le edema or swelling.  Pt also mentioned that she gets dizzy spells.  Mainly when she gets up to movie around.  This has been happening on/off for days.  No focal neuro deficits.  No headache.  No synope.  Asked to admit for w/u of dizziness with possible mri in am.  Review of Systems:  Positive and negative as per HPI otherwise all other systems are negative  Past Medical History: Past Medical History  Diagnosis Date  . COPD (chronic obstructive pulmonary disease)   . Hypertension   . Anxiety   . Depression   . Arthritis   . AAA (abdominal aortic aneurysm)   . Neuropathy   . Leg swelling   . Oxygen dependent   . Myocardial infarction   . Pneumonia    Past Surgical History  Procedure Laterality Date  . Cholecystectomy    . Appendectomy      Medications: Prior to Admission medications   Medication Sig Start Date End Date Taking? Authorizing Provider  albuterol (PROVENTIL) (2.5 MG/3ML) 0.083% nebulizer solution Take 2.5 mg by nebulization every 6 (six) hours as needed for wheezing or shortness of breath.   Yes Historical Provider, MD  aspirin EC 81 MG tablet Take 81 mg by mouth daily.   Yes Historical Provider, MD  budesonide-formoterol (SYMBICORT) 160-4.5 MCG/ACT inhaler Inhale 2 puffs into the lungs 2 (two) times daily.     Yes Historical Provider, MD  citalopram (CELEXA) 20 MG tablet Take 20 mg by mouth at bedtime.    Yes Historical Provider, MD  gabapentin (NEURONTIN) 100 MG tablet Take 100 mg by mouth 3 (three) times daily.    Yes Historical Provider, MD   sulfamethoxazole-trimethoprim (BACTRIM DS) 800-160 MG per tablet Take 1 tablet by mouth 2 (two) times daily. 12/10/13  Yes Ward Givens, MD  tiotropium (SPIRIVA) 18 MCG inhalation capsule Place 18 mcg into inhaler and inhale daily.    Yes Historical Provider, MD  torsemide (DEMADEX) 20 MG tablet Take 20 mg by mouth every morning.    Yes Historical Provider, MD    Allergies:   Allergies  Allergen Reactions  . Other     FOOD: Spice Triggers acid reflux  . Tomato     Triggers acid reflux    Social History:  reports that she quit smoking about 3 years ago. Her smoking use included Cigarettes. She smoked 0.00 packs per day for 50 years. She quit smokeless tobacco use about 3 years ago. She reports that she does not drink alcohol or use illicit drugs.  Family History: Family History  Problem Relation Age of Onset  . Diabetes Brother   . Cancer Brother     Throat cancer, Heart attack  . Heart disease Brother   . Heart attack Brother   . Cancer Father   . Cancer Sister     Physical Exam: Filed Vitals:   01/24/14 1943 01/24/14 2016 01/24/14 2017 01/24/14 2018  BP: 93/67 94/63 106/68 96/55  Pulse:  92 82 93 98  Temp: 98.3 F (36.8 C)     TempSrc: Oral     Resp: 16     SpO2: 92%      General appearance: alert, cooperative and no distress Head: Normocephalic, without obvious abnormality, atraumatic  No nystagmus Nose: Nares normal. Septum midline. Mucosa normal. No drainage or sinus tenderness. Neck: no JVD and supple, symmetrical, trachea midline Lungs: diminished breath sounds bibasilar Heart: regular rate and rhythm, S1, S2 normal, no murmur, click, rub or gallop Abdomen: soft, non-tender; bowel sounds normal; no masses,  no organomegaly Extremities: extremities normal, atraumatic, no cyanosis or edema Pulses: 2+ and symmetric Skin: Skin color, texture, turgor normal. No rashes or lesions Neurologic: Grossly normal    Labs on Admission:   Recent Labs  01/24/14 1554   NA 137  K 4.4  CL 99  CO2 28  GLUCOSE 99  BUN 26*  CREATININE 1.43*  CALCIUM 9.7    Recent Labs  01/24/14 1554  WBC 5.2  NEUTROABS 2.9  HGB 14.7  HCT 44.1  MCV 95.7  PLT 227    Recent Labs  01/24/14 1554  TROPONINI <0.30   Radiological Exams on Admission: Ct Head Wo Contrast  01/24/2014   CLINICAL DATA:  Dizziness  EXAM: CT HEAD WITHOUT CONTRAST  TECHNIQUE: Contiguous axial images were obtained from the base of the skull through the vertex without contrast.  COMPARISON:  11/27/2008  FINDINGS: Mild age-related brain atrophy. Patchy white matter chronic microvascular ischemic changes and remote right basal ganglia lacunar-type infarct. No acute intracranial hemorrhage, mass lesion, definite acute infarction, midline shift, herniation, hydrocephalus, or extra-axial fluid collection. Cisterns patent. No cerebellar abnormality. Symmetric orbits. Mastoids and sinuses clear. Atherosclerosis of the intracranial vessels of the skullbase.  IMPRESSION: No acute intracranial finding.  Atrophy, chronic microvascular ischemic changes, and remote right basal ganglia lacunar-type infarct.   Electronically Signed   By: Ruel Favorsrevor  Shick M.D.   On: 01/24/2014 19:45   Dg Chest Portable 1 View  01/24/2014   CLINICAL DATA:  Shortness of breath and cough  EXAM: PORTABLE CHEST - 1 VIEW  COMPARISON:  12/10/2013  FINDINGS: Moderate prominence of the cardiac silhouette is reidentified with areas of curvilinear bilateral lower lobe scarring or atelectasis. Rightward scoliosis of the thoracic spine is again noted. No new focal pulmonary opacity.  IMPRESSION: Cardiomegaly with curvilinear bilateral lower lobe scarring or atelectasis reidentified.   Electronically Signed   By: Christiana PellantGretchen  Green M.D.   On: 01/24/2014 16:02    Assessment/Plan  80 yo female with dizzy spells upon standing/moving with chronic resp failure, hemoptysis  Principal Problem:   Orthostatic dizziness-  Dizziness probably from orthostatis.   Pt with worsening cr.  Hold diuretic for tonight.  Provide some gentle ivf.  If symptoms persist or any development of other neurological issues, consider mri.  Will not obtain at this time.  freq neuro cks overnight.  Active Problems:   COPD- no wheezing at this time, will hold off on any steroids at this time   Chest pain serial enzymes   SOB (shortness of breath)  resolved   Chronic respiratory failure with hypoxia  At baseline, cont oxygen at 3.5 liters   Cough with hemoptysis- will cover with a zpack.  Further w/u per pcp.   COPD (chronic obstructive pulmonary disease)   Acute renal failure  As above  pcp is dr Juanetta Goslinghawkins.    Kyren Vaux A 01/24/2014, 9:37 PM

## 2014-01-25 LAB — CBC
HEMATOCRIT: 38.9 % (ref 36.0–46.0)
HEMOGLOBIN: 12.9 g/dL (ref 12.0–15.0)
MCH: 31.9 pg (ref 26.0–34.0)
MCHC: 33.2 g/dL (ref 30.0–36.0)
MCV: 96.3 fL (ref 78.0–100.0)
PLATELETS: 220 10*3/uL (ref 150–400)
RBC: 4.04 MIL/uL (ref 3.87–5.11)
RDW: 14 % (ref 11.5–15.5)
WBC: 5.5 10*3/uL (ref 4.0–10.5)

## 2014-01-25 LAB — URINALYSIS, ROUTINE W REFLEX MICROSCOPIC
BILIRUBIN URINE: NEGATIVE
GLUCOSE, UA: NEGATIVE mg/dL
Hgb urine dipstick: NEGATIVE
KETONES UR: NEGATIVE mg/dL
Leukocytes, UA: NEGATIVE
Nitrite: NEGATIVE
PH: 5.5 (ref 5.0–8.0)
PROTEIN: NEGATIVE mg/dL
Specific Gravity, Urine: >1.03 — ABNORMAL HIGH (ref 1.005–1.030)
Urobilinogen, UA: 0.2 mg/dL (ref 0.0–1.0)

## 2014-01-25 LAB — BASIC METABOLIC PANEL
BUN: 31 mg/dL — ABNORMAL HIGH (ref 6–23)
CHLORIDE: 102 meq/L (ref 96–112)
CO2: 25 mEq/L (ref 19–32)
Calcium: 9.1 mg/dL (ref 8.4–10.5)
Creatinine, Ser: 1.58 mg/dL — ABNORMAL HIGH (ref 0.50–1.10)
GFR calc Af Amer: 33 mL/min — ABNORMAL LOW (ref >90)
GFR calc non Af Amer: 28 mL/min — ABNORMAL LOW (ref >90)
GLUCOSE: 168 mg/dL — AB (ref 70–99)
POTASSIUM: 5.2 meq/L (ref 3.7–5.3)
SODIUM: 138 meq/L (ref 137–147)

## 2014-01-25 LAB — TROPONIN I
Troponin I: <0.3 ng/mL (ref <0.30)
Troponin I: <0.3 ng/mL (ref <0.30)

## 2014-01-25 MED ORDER — IPRATROPIUM-ALBUTEROL 0.5-2.5 (3) MG/3ML IN SOLN
3.0000 mL | Freq: Four times a day (QID) | RESPIRATORY_TRACT | Status: DC
  Administered 2014-01-25 (×4): 3 mL via RESPIRATORY_TRACT
  Filled 2014-01-25 (×4): qty 3

## 2014-01-25 NOTE — Progress Notes (Signed)
UR completed 

## 2014-01-26 DIAGNOSIS — E860 Dehydration: Secondary | ICD-10-CM | POA: Diagnosis present

## 2014-01-26 MED ORDER — ALBUTEROL SULFATE (2.5 MG/3ML) 0.083% IN NEBU
2.5000 mg | INHALATION_SOLUTION | Freq: Four times a day (QID) | RESPIRATORY_TRACT | Status: DC
  Administered 2014-01-26 (×2): 2.5 mg via RESPIRATORY_TRACT
  Filled 2014-01-26 (×2): qty 3

## 2014-01-26 NOTE — Progress Notes (Signed)
Patient discharged home with family.  IV removed - WNL.  Appointment already in place with PCP for beginning of February.  Patient and family aware.  No changes to medications made - instructed to take all meds and inhalers as prescribed to prevent further COPD exacerbation.  Instructed to call MD for worsening of symptoms.  No questions at this time.  Stable to DC  - left floor via WC with NT

## 2014-01-26 NOTE — Progress Notes (Signed)
NAMETASHONNA, SILLIMAN                   ACCOUNT NO.:  [id number]  MEDICAL RECORD NO.:  [id number]  LOCATION:  A319                          FACILITY:  [facility]  PHYSICIAN:  Iliany Losier L. Juanetta Gosling, M.D.DATE OF BIRTH:  04-Nov-1924  DATE OF PROCEDURE: DATE OF DISCHARGE:                                PROGRESS NOTE   Melanie Lowe was admitted with dizziness.  There was some question about having an MRI this morning but she says her dizziness is improved.  She was somewhat orthostatic on admission and was felt to have probably some dehydration.  She has no other new complaints.  She says she feels a little better.  She has been receiving IV fluids.  PHYSICAL EXAMINATION:  VITAL SIGNS:  Her temperature is 97.7, pulse 63, respirations 16, blood pressure 106/64, O2 sats 93%. CHEST:  Relatively clear with some rhonchi. HEART:  Regular without gallop. ABDOMEN:  Soft. EXTREMITIES:  No edema.  ASSESSMENT:  She was admitted with dehydration and has improved with treatment.  She had dizziness, which I think was related to orthostasis. She has severe chronic obstructive pulmonary disease, which seems to be pretty stable.  PLAN:  Continue with IV fluids.  I think she will probably be able to go home tomorrow if she has some other issues in the meantime.     Gudelia Eugene L. Juanetta Gosling, M.D.     ELH/MEDQ  D:  01/25/2014  T:  01/26/2014  Job:  [id number]

## 2014-01-26 NOTE — Progress Notes (Signed)
Subjective: She feels well. She was able to ambulate in the halls without difficulty. She wants to go home  Objective: Vital signs in last 24 hours: Temp:  [97.3 F (36.3 C)-97.5 F (36.4 C)] 97.5 F (36.4 C) (01/27 0622) Pulse Rate:  [59-72] 59 (01/27 0622) Resp:  [17] 17 (01/27 0622) BP: (113-135)/(60-77) 135/77 mmHg (01/27 0622) SpO2:  [92 %-99 %] 95 % (01/27 0715) Weight change:  Last BM Date: 01/23/14  Intake/Output from previous day: 01/26 0701 - 01/27 0700 In: 560 [P.O.:560] Out: -   PHYSICAL EXAM General appearance: alert, cooperative and no distress Resp: clear to auscultation bilaterally Cardio: regular rate and rhythm, S1, S2 normal, no murmur, click, rub or gallop GI: soft, non-tender; bowel sounds normal; no masses,  no organomegaly Extremities: extremities normal, atraumatic, no cyanosis or edema  Lab Results:    Basic Metabolic Panel:  Recent Labs  33/83/29 1554 01/25/14 0544  NA 137 138  K 4.4 5.2  CL 99 102  CO2 28 25  GLUCOSE 99 168*  BUN 26* 31*  CREATININE 1.43* 1.58*  CALCIUM 9.7 9.1   Liver Function Tests: No results found for this basename: AST, ALT, ALKPHOS, BILITOT, PROT, ALBUMIN,  in the last 72 hours No results found for this basename: LIPASE, AMYLASE,  in the last 72 hours No results found for this basename: AMMONIA,  in the last 72 hours CBC:  Recent Labs  01/24/14 1554 01/25/14 0544  WBC 5.2 5.5  NEUTROABS 2.9  --   HGB 14.7 12.9  HCT 44.1 38.9  MCV 95.7 96.3  PLT 227 220   Cardiac Enzymes:  Recent Labs  01/24/14 2309 01/25/14 0544 01/25/14 1051  TROPONINI <0.30 <0.30 <0.30   BNP:  Recent Labs  01/24/14 1555  PROBNP 112.5   D-Dimer: No results found for this basename: DDIMER,  in the last 72 hours CBG: No results found for this basename: GLUCAP,  in the last 72 hours Hemoglobin A1C: No results found for this basename: HGBA1C,  in the last 72 hours Fasting Lipid Panel: No results found for this  basename: CHOL, HDL, LDLCALC, TRIG, CHOLHDL, LDLDIRECT,  in the last 72 hours Thyroid Function Tests: No results found for this basename: TSH, T4TOTAL, FREET4, T3FREE, THYROIDAB,  in the last 72 hours Anemia Panel: No results found for this basename: VITAMINB12, FOLATE, FERRITIN, TIBC, IRON, RETICCTPCT,  in the last 72 hours Coagulation: No results found for this basename: LABPROT, INR,  in the last 72 hours Urine Drug Screen: Drugs of Abuse  No results found for this basename: labopia, cocainscrnur, labbenz, amphetmu, thcu, labbarb    Alcohol Level: No results found for this basename: ETH,  in the last 72 hours Urinalysis:  Recent Labs  01/25/14 0034  COLORURINE YELLOW  LABSPEC >1.030*  PHURINE 5.5  GLUCOSEU NEGATIVE  HGBUR NEGATIVE  BILIRUBINUR NEGATIVE  KETONESUR NEGATIVE  PROTEINUR NEGATIVE  UROBILINOGEN 0.2  NITRITE NEGATIVE  LEUKOCYTESUR NEGATIVE   Misc. Labs:  ABGS No results found for this basename: PHART, PCO2, PO2ART, TCO2, HCO3,  in the last 72 hours CULTURES No results found for this or any previous visit (from the past 240 hour(s)). Studies/Results: Ct Head Wo Contrast  01/24/2014   CLINICAL DATA:  Dizziness  EXAM: CT HEAD WITHOUT CONTRAST  TECHNIQUE: Contiguous axial images were obtained from the base of the skull through the vertex without contrast.  COMPARISON:  11/27/2008  FINDINGS: Mild age-related brain atrophy. Patchy white matter chronic microvascular ischemic changes and remote right  basal ganglia lacunar-type infarct. No acute intracranial hemorrhage, mass lesion, definite acute infarction, midline shift, herniation, hydrocephalus, or extra-axial fluid collection. Cisterns patent. No cerebellar abnormality. Symmetric orbits. Mastoids and sinuses clear. Atherosclerosis of the intracranial vessels of the skullbase.  IMPRESSION: No acute intracranial finding.  Atrophy, chronic microvascular ischemic changes, and remote right basal ganglia lacunar-type  infarct.   Electronically Signed   By: Ruel Favorsrevor  Shick M.D.   On: 01/24/2014 19:45   Dg Chest Portable 1 View  01/24/2014   CLINICAL DATA:  Shortness of breath and cough  EXAM: PORTABLE CHEST - 1 VIEW  COMPARISON:  12/10/2013  FINDINGS: Moderate prominence of the cardiac silhouette is reidentified with areas of curvilinear bilateral lower lobe scarring or atelectasis. Rightward scoliosis of the thoracic spine is again noted. No new focal pulmonary opacity.  IMPRESSION: Cardiomegaly with curvilinear bilateral lower lobe scarring or atelectasis reidentified.   Electronically Signed   By: Christiana PellantGretchen  Green M.D.   On: 01/24/2014 16:02    Medications:  Prior to Admission:  Prescriptions prior to admission  Medication Sig Dispense Refill  . albuterol (PROVENTIL) (2.5 MG/3ML) 0.083% nebulizer solution Take 2.5 mg by nebulization every 6 (six) hours as needed for wheezing or shortness of breath.      Marland Kitchen. aspirin EC 81 MG tablet Take 81 mg by mouth daily.      . budesonide-formoterol (SYMBICORT) 160-4.5 MCG/ACT inhaler Inhale 2 puffs into the lungs 2 (two) times daily.        . citalopram (CELEXA) 20 MG tablet Take 20 mg by mouth at bedtime.       . gabapentin (NEURONTIN) 100 MG tablet Take 100 mg by mouth 3 (three) times daily.       Marland Kitchen. sulfamethoxazole-trimethoprim (BACTRIM DS) 800-160 MG per tablet Take 1 tablet by mouth 2 (two) times daily.  20 tablet  0  . tiotropium (SPIRIVA) 18 MCG inhalation capsule Place 18 mcg into inhaler and inhale daily.       Marland Kitchen. torsemide (DEMADEX) 20 MG tablet Take 20 mg by mouth every morning.        Scheduled: . albuterol  2.5 mg Nebulization QID  . aspirin EC  81 mg Oral Daily  . azithromycin  250 mg Oral Daily  . budesonide-formoterol  2 puff Inhalation BID  . citalopram  20 mg Oral QHS  . gabapentin  100 mg Oral TID  . sodium chloride  3 mL Intravenous Q12H  . tiotropium  18 mcg Inhalation Daily   Continuous:  ZOX:[id number]:albuterol, guaiFENesin-dextromethorphan  Assesment:  She was admitted with dizziness which appeared to be related to orthostasis. She is much improved now. She had not eaten for about 16 hours on admission and I think that may have had something to do with her problem as well. She has chronic respiratory failure and severe COPD. I think she was dehydrated Principal Problem:   Orthostatic dizziness Active Problems:   COPD   Chest pain   SOB (shortness of breath)   Chronic respiratory failure with hypoxia   Cough with hemoptysis   COPD (chronic obstructive pulmonary disease)   Acute renal failure   Dizzy    Plan: She can be discharged home today she does not want home health services    LOS: 2 days   Melanie Lowe L 01/26/2014, 9:03 AM

## 2014-02-05 NOTE — Discharge Summary (Signed)
Physician Discharge Summary  Patient ID: Melanie Lowe MRN: [id number] DOB/AGE: April 14, 1924 80 y.o. Primary Care Physician:Arvon Schreiner L, MD Admit date: 01/24/2014 Discharge date: 02/05/2014    Discharge Diagnoses:   Principal Problem:   Orthostatic dizziness Active Problems:   COPD   Chest pain   SOB (shortness of breath)   Chronic respiratory failure with hypoxia   Cough with hemoptysis   COPD (chronic obstructive pulmonary disease)   Acute renal failure   Dizzy   Dehydration     Medication List         albuterol (2.5 MG/3ML) 0.083% nebulizer solution  Commonly known as:  PROVENTIL  Take 2.5 mg by nebulization every 6 (six) hours as needed for wheezing or shortness of breath.     aspirin EC 81 MG tablet  Take 81 mg by mouth daily.     budesonide-formoterol 160-4.5 MCG/ACT inhaler  Commonly known as:  SYMBICORT  Inhale 2 puffs into the lungs 2 (two) times daily.     citalopram 20 MG tablet  Commonly known as:  CELEXA  Take 20 mg by mouth at bedtime.     gabapentin 100 MG tablet  Commonly known as:  NEURONTIN  Take 100 mg by mouth 3 (three) times daily.     sulfamethoxazole-trimethoprim 800-160 MG per tablet  Commonly known as:  BACTRIM DS  Take 1 tablet by mouth 2 (two) times daily.     tiotropium 18 MCG inhalation capsule  Commonly known as:  SPIRIVA  Place 18 mcg into inhaler and inhale daily.     torsemide 20 MG tablet  Commonly known as:  DEMADEX  Take 20 mg by mouth every morning.        Discharged Condition: Improved    Consults: None  Significant Diagnostic Studies: Ct Head Wo Contrast  01/24/2014   CLINICAL DATA:  Dizziness  EXAM: CT HEAD WITHOUT CONTRAST  TECHNIQUE: Contiguous axial images were obtained from the base of the skull through the vertex without contrast.  COMPARISON:  11/27/2008  FINDINGS: Mild age-related brain atrophy. Patchy white matter chronic microvascular ischemic changes and remote right basal ganglia lacunar-type  infarct. No acute intracranial hemorrhage, mass lesion, definite acute infarction, midline shift, herniation, hydrocephalus, or extra-axial fluid collection. Cisterns patent. No cerebellar abnormality. Symmetric orbits. Mastoids and sinuses clear. Atherosclerosis of the intracranial vessels of the skullbase.  IMPRESSION: No acute intracranial finding.  Atrophy, chronic microvascular ischemic changes, and remote right basal ganglia lacunar-type infarct.   Electronically Signed   By: Ruel Favors M.D.   On: 01/24/2014 19:45   Dg Chest Portable 1 View  01/24/2014   CLINICAL DATA:  Shortness of breath and cough  EXAM: PORTABLE CHEST - 1 VIEW  COMPARISON:  12/10/2013  FINDINGS: Moderate prominence of the cardiac silhouette is reidentified with areas of curvilinear bilateral lower lobe scarring or atelectasis. Rightward scoliosis of the thoracic spine is again noted. No new focal pulmonary opacity.  IMPRESSION: Cardiomegaly with curvilinear bilateral lower lobe scarring or atelectasis reidentified.   Electronically Signed   By: Christiana Pellant M.D.   On: 01/24/2014 16:02    Lab Results: Basic Metabolic Panel: No results found for this basename: NA, K, CL, CO2, GLUCOSE, BUN, CREATININE, CALCIUM, MG, PHOS,  in the last 72 hours Liver Function Tests: No results found for this basename: AST, ALT, ALKPHOS, BILITOT, PROT, ALBUMIN,  in the last 72 hours   CBC: No results found for this basename: WBC, NEUTROABS, HGB, HCT, MCV, PLT,  in the last  72 hours  No results found for this or any previous visit (from the past 240 hour(s)).   Hospital Course: She came to the emergency department with dizziness and weakness. She was also somewhat more short of breath. When she was seen in the emergency department she was noted to be dizzy. She had CT of the brain which did not show any acute abnormality. It was thought that she might be somewhat dehydrated. She was given IV fluids and by the next morning was markedly  improved. There have been some discussion of having an MRI of the brain but this was not done. She had improved enough that she was back to baseline her dizziness had resolved. She remained in the hospital for one more day to see that she could ambulate but her dizziness did not return and to improve her breathing. All of this was accomplished and she was able to ambulate in the hall with no dizziness.  Discharge Exam: Blood pressure 135/77, pulse 59, temperature 97.5 F (36.4 C), temperature source Oral, resp. rate 17, height 5\' 1"  (1.549 m), weight 68.9 kg (151 lb 14.4 oz), SpO2 95.00%. She is awake and alert. She has bilateral rhonchi on her chest exam. Her heart is regular. Neurologically she is intact  Disposition: Home      Discharge Orders   Future Orders Complete By Expires   Discharge patient  As directed         Signed: Reshawn Ostlund L   02/05/2014, 7:46 AM

## 2014-05-07 ENCOUNTER — Emergency Department (HOSPITAL_COMMUNITY)
Admission: EM | Admit: 2014-05-07 | Discharge: 2014-05-07 | Disposition: A | Discharge status: Home / Self Care | Payer: Medicare HMO | Attending: Emergency Medicine | Admitting: Emergency Medicine

## 2014-05-07 ENCOUNTER — Encounter (HOSPITAL_COMMUNITY): Payer: Self-pay | Admitting: Emergency Medicine

## 2014-05-07 ENCOUNTER — Emergency Department (HOSPITAL_COMMUNITY): Payer: Medicare HMO

## 2014-05-07 DIAGNOSIS — F411 Generalized anxiety disorder: Secondary | ICD-10-CM | POA: Insufficient documentation

## 2014-05-07 DIAGNOSIS — F329 Major depressive disorder, single episode, unspecified: Secondary | ICD-10-CM | POA: Insufficient documentation

## 2014-05-07 DIAGNOSIS — Z8701 Personal history of pneumonia (recurrent): Secondary | ICD-10-CM | POA: Insufficient documentation

## 2014-05-07 DIAGNOSIS — Z79899 Other long term (current) drug therapy: Secondary | ICD-10-CM | POA: Insufficient documentation

## 2014-05-07 DIAGNOSIS — J449 Chronic obstructive pulmonary disease, unspecified: Secondary | ICD-10-CM | POA: Insufficient documentation

## 2014-05-07 DIAGNOSIS — Z7982 Long term (current) use of aspirin: Secondary | ICD-10-CM | POA: Insufficient documentation

## 2014-05-07 DIAGNOSIS — Z8673 Personal history of transient ischemic attack (TIA), and cerebral infarction without residual deficits: Secondary | ICD-10-CM | POA: Insufficient documentation

## 2014-05-07 DIAGNOSIS — I10 Essential (primary) hypertension: Secondary | ICD-10-CM | POA: Insufficient documentation

## 2014-05-07 DIAGNOSIS — L259 Unspecified contact dermatitis, unspecified cause: Secondary | ICD-10-CM | POA: Insufficient documentation

## 2014-05-07 DIAGNOSIS — Z87891 Personal history of nicotine dependence: Secondary | ICD-10-CM | POA: Insufficient documentation

## 2014-05-07 DIAGNOSIS — L309 Dermatitis, unspecified: Secondary | ICD-10-CM

## 2014-05-07 DIAGNOSIS — IMO0002 Reserved for concepts with insufficient information to code with codable children: Secondary | ICD-10-CM | POA: Insufficient documentation

## 2014-05-07 DIAGNOSIS — F3289 Other specified depressive episodes: Secondary | ICD-10-CM | POA: Insufficient documentation

## 2014-05-07 DIAGNOSIS — M129 Arthropathy, unspecified: Secondary | ICD-10-CM | POA: Insufficient documentation

## 2014-05-07 DIAGNOSIS — J4489 Other specified chronic obstructive pulmonary disease: Secondary | ICD-10-CM | POA: Insufficient documentation

## 2014-05-07 DIAGNOSIS — Z9981 Dependence on supplemental oxygen: Secondary | ICD-10-CM | POA: Insufficient documentation

## 2014-05-07 DIAGNOSIS — I252 Old myocardial infarction: Secondary | ICD-10-CM | POA: Insufficient documentation

## 2014-05-07 DIAGNOSIS — Z792 Long term (current) use of antibiotics: Secondary | ICD-10-CM | POA: Insufficient documentation

## 2014-05-07 DIAGNOSIS — G589 Mononeuropathy, unspecified: Secondary | ICD-10-CM | POA: Insufficient documentation

## 2014-05-07 HISTORY — DX: Cerebral infarction, unspecified: I63.9

## 2014-05-07 LAB — COMPREHENSIVE METABOLIC PANEL
ALT: 12 U/L (ref 0–35)
AST: 21 U/L (ref 0–37)
Albumin: 3.7 g/dL (ref 3.5–5.2)
Alkaline Phosphatase: 73 U/L (ref 39–117)
BUN: 18 mg/dL (ref 6–23)
CO2: 30 meq/L (ref 19–32)
CREATININE: 1 mg/dL (ref 0.50–1.10)
Calcium: 9.8 mg/dL (ref 8.4–10.5)
Chloride: 100 mEq/L (ref 96–112)
GFR calc Af Amer: 57 mL/min — ABNORMAL LOW (ref >90)
GFR calc non Af Amer: 49 mL/min — ABNORMAL LOW (ref >90)
Glucose, Bld: 91 mg/dL (ref 70–99)
Potassium: 3.7 mEq/L (ref 3.7–5.3)
Sodium: 140 mEq/L (ref 137–147)
Total Bilirubin: 0.5 mg/dL (ref 0.3–1.2)
Total Protein: 7.2 g/dL (ref 6.0–8.3)

## 2014-05-07 LAB — CBC WITH DIFFERENTIAL/PLATELET
BASOS ABS: 0 10*3/uL (ref 0.0–0.1)
Basophils Relative: 0 % (ref 0–1)
EOS PCT: 3 % (ref 0–5)
Eosinophils Absolute: 0.2 10*3/uL (ref 0.0–0.7)
HEMATOCRIT: 42.1 % (ref 36.0–46.0)
Hemoglobin: 14.4 g/dL (ref 12.0–15.0)
LYMPHS PCT: 35 % (ref 12–46)
Lymphs Abs: 2.2 10*3/uL (ref 0.7–4.0)
MCH: 32.4 pg (ref 26.0–34.0)
MCHC: 34.2 g/dL (ref 30.0–36.0)
MCV: 94.6 fL (ref 78.0–100.0)
MONO ABS: 0.5 10*3/uL (ref 0.1–1.0)
Monocytes Relative: 8 % (ref 3–12)
Neutro Abs: 3.4 10*3/uL (ref 1.7–7.7)
Neutrophils Relative %: 54 % (ref 43–77)
Platelets: 240 10*3/uL (ref 150–400)
RBC: 4.45 MIL/uL (ref 3.87–5.11)
RDW: 13.1 % (ref 11.5–15.5)
WBC: 6.3 10*3/uL (ref 4.0–10.5)

## 2014-05-07 MED ORDER — SULFAMETHOXAZOLE-TRIMETHOPRIM 800-160 MG PO TABS: 1.0000 | ORAL_TABLET | Freq: Two times a day (BID) | ORAL | Status: DC

## 2014-05-07 MED ORDER — METHYLPREDNISOLONE SODIUM SUCC 125 MG IJ SOLR
125.0000 mg | Freq: Once | INTRAMUSCULAR | Status: AC
  Administered 2014-05-07: 125 mg via INTRAMUSCULAR
  Filled 2014-05-07: qty 2

## 2014-05-07 MED ORDER — HYDROCODONE-ACETAMINOPHEN 5-325 MG PO TABS
1.0000 | ORAL_TABLET | Freq: Four times a day (QID) | ORAL | Status: DC | PRN
Start: 2014-05-07 — End: 2014-09-05

## 2014-05-07 MED ORDER — HYDROCODONE-ACETAMINOPHEN 5-325 MG PO TABS
1.0000 | ORAL_TABLET | Freq: Once | ORAL | Status: AC
  Administered 2014-05-07: 1 via ORAL
  Filled 2014-05-07: qty 1

## 2014-05-07 NOTE — Discharge Instructions (Signed)
Follow up with your md next week. °

## 2014-05-07 NOTE — ED Notes (Addendum)
Red,swollen lt AC, since January after being admitted and having IV in place.   Went to MD in April and  Has finished  Doxycycline, Using warm compresses.    Has red areas on both arms.    Pt is on home 02

## 2014-05-07 NOTE — ED Notes (Addendum)
Pt states that she has had redness and swelling at IV site from prior admission since April, PCP prescribed Doxycyline and the redness has gotten worse since, The redness and swelling spreads from the left AC up and down the arm. Pt is on chronic O2 3L [place].

## 2014-05-07 NOTE — ED Provider Notes (Signed)
CSN: [id number]     Arrival date & time 05/07/14  1633 History   First MD Initiated Contact with Patient 05/07/14 1704    This chart was scribed for Benny LennertJoseph L Terik Haughey, MD by Marica OtterNusrat Rahman, ED Scribe. This patient was seen in room APA11/[facility]11 and the patient's care was started at 5:08 PM.  Chief Complaint  Patient presents with  . Arm Swelling    Patient is a 80 y.o. female presenting with general illness. The history is provided by the patient and a relative. No language interpreter was used.  Illness Location:   distal humerus and upper uni.  Onset quality:  Unable to specify Duration:  4 months Timing:  Constant Progression:  Worsening Chronicity:  New Context:  Swelling and redness in IV site Associated symptoms: no abdominal pain, no chest pain, no congestion, no cough, no diarrhea, no fatigue, no headaches and no rash    HPI Comments: Melanie Lowe is a 80 y.o. female who presents to the Emergency Department complaining of constant redness and associated swelling of left AC onset on 12/2013 when an IV was placed during a prior admission.     Past Medical History  Diagnosis Date  . COPD (chronic obstructive pulmonary disease)   . Hypertension   . Anxiety   . Depression   . Arthritis   . AAA (abdominal aortic aneurysm)   . Neuropathy   . Leg swelling   . Oxygen dependent   . Myocardial infarction   . Pneumonia   . Stroke    Past Surgical History  Procedure Laterality Date  . Cholecystectomy    . Appendectomy     Family History  Problem Relation Age of Onset  . Diabetes Brother   . Cancer Brother     Throat cancer, Heart attack  . Heart disease Brother   . Heart attack Brother   . Cancer Father   . Cancer Sister    History  Substance Use Topics  . Smoking status: Former Smoker -- 50 years    Types: Cigarettes    Quit date: 12/31/2010  . Smokeless tobacco: Former NeurosurgeonUser    Quit date: 12/31/2010  . Alcohol Use: No   OB History   Grav Para Term Preterm Abortions  TAB SAB Ect Mult Living                 Review of Systems  Constitutional: Negative for appetite change and fatigue.  HENT: Negative for congestion, ear discharge and sinus pressure.   Eyes: Negative for discharge.  Respiratory: Negative for cough.   Cardiovascular: Negative for chest pain.  Gastrointestinal: Negative for abdominal pain and diarrhea.  Genitourinary: Negative for frequency and hematuria.  Musculoskeletal: Negative for back pain.       Swelling and erythema of distal humerus and upper uni.   Skin: Negative for rash.  Neurological: Negative for seizures and headaches.  Psychiatric/Behavioral: Negative for hallucinations.      Allergies  Nsaids; Other; and Tomato  Home Medications   Prior to Admission medications   Medication Sig Start Date End Date Taking? Authorizing Provider  albuterol (PROVENTIL) (2.5 MG/3ML) 0.083% nebulizer solution Take 2.5 mg by nebulization every 6 (six) hours as needed for wheezing or shortness of breath.    Historical Provider, MD  aspirin EC 81 MG tablet Take 81 mg by mouth daily.    Historical Provider, MD  budesonide-formoterol (SYMBICORT) 160-4.5 MCG/ACT inhaler Inhale 2 puffs into the lungs 2 (two) times daily.  Historical Provider, MD  citalopram (CELEXA) 20 MG tablet Take 20 mg by mouth at bedtime.     Historical Provider, MD  gabapentin (NEURONTIN) 100 MG tablet Take 100 mg by mouth 3 (three) times daily.     Historical Provider, MD  sulfamethoxazole-trimethoprim (BACTRIM DS) 800-160 MG per tablet Take 1 tablet by mouth 2 (two) times daily. 12/10/13   Ward Givens, MD  tiotropium (SPIRIVA) 18 MCG inhalation capsule Place 18 mcg into inhaler and inhale daily.     Historical Provider, MD  torsemide (DEMADEX) 20 MG tablet Take 20 mg by mouth every morning.     Historical Provider, MD   Triage Vitals: BP 111/73  Pulse 95  Temp(Src) 98.1 F (36.7 C) (Oral)  Resp 18  Ht 5\' 1"  (1.549 m)  Wt 151 lb (68.493 kg)  BMI 28.55 kg/m2   SpO2 91% Physical Exam  Constitutional: She is oriented to person, place, and time. She appears well-developed.  HENT:  Head: Normocephalic.  Eyes: Conjunctivae and EOM are normal. No scleral icterus.  Neck: Neck supple. No tracheal deviation present. No thyromegaly present.  Cardiovascular: Normal rate and regular rhythm.  Exam reveals no gallop and no friction rub.   No murmur heard. Pulmonary/Chest: No stridor. She has no wheezes. She has no rales. She exhibits no tenderness.  Abdominal: She exhibits no distension. There is no tenderness. There is no rebound.  Musculoskeletal: Normal range of motion.  Swelling of distal humerus and upper uni.   Lymphadenopathy:    She has no cervical adenopathy.  Neurological: She is oriented to person, place, and time. She exhibits normal muscle tone. Coordination normal.  Skin: Skin is warm. No rash noted. No erythema.  Psychiatric: She has a normal mood and affect. Her behavior is normal.    ED Course  Procedures (including critical care time) DIAGNOSTIC STUDIES: Oxygen Saturation is 91% on RA, low by my interpretation.    COORDINATION OF CARE:  5:10 PM-Discussed treatment plan which includes imaging with pt at bedside and pt agreed to plan.   Labs Review Labs Reviewed - No data to display  Imaging Review No results found.   EKG Interpretation None      MDM   Final diagnoses:  None    The chart was scribed for me under my direct supervision.  I personally performed the history, physical, and medical decision making and all procedures in the evaluation of this patient.Benny Lennert, MD 05/07/14 1946

## 2014-07-23 ENCOUNTER — Other Ambulatory Visit (HOSPITAL_COMMUNITY): Payer: Self-pay | Admitting: Pulmonary Disease

## 2014-07-23 DIAGNOSIS — I809 Phlebitis and thrombophlebitis of unspecified site: Secondary | ICD-10-CM

## 2014-07-28 ENCOUNTER — Ambulatory Visit (HOSPITAL_COMMUNITY): Payer: Medicare HMO

## 2014-08-03 ENCOUNTER — Ambulatory Visit (HOSPITAL_COMMUNITY)
Admission: RE | Admit: 2014-08-03 | Discharge: 2014-08-03 | Disposition: A | Discharge status: Home / Self Care | Payer: Medicare HMO | Source: Ambulatory Visit | Attending: Pulmonary Disease | Admitting: Pulmonary Disease

## 2014-08-03 ENCOUNTER — Other Ambulatory Visit (HOSPITAL_COMMUNITY): Payer: Self-pay | Admitting: Pulmonary Disease

## 2014-08-03 DIAGNOSIS — I809 Phlebitis and thrombophlebitis of unspecified site: Secondary | ICD-10-CM

## 2014-08-03 DIAGNOSIS — M7989 Other specified soft tissue disorders: Secondary | ICD-10-CM | POA: Insufficient documentation

## 2014-08-03 DIAGNOSIS — M79609 Pain in unspecified limb: Secondary | ICD-10-CM | POA: Insufficient documentation

## 2014-09-05 ENCOUNTER — Emergency Department (HOSPITAL_COMMUNITY)
Admission: EM | Admit: 2014-09-05 | Discharge: 2014-09-05 | Disposition: A | Discharge status: Home / Self Care | Payer: Medicare HMO | Attending: Emergency Medicine | Admitting: Emergency Medicine

## 2014-09-05 ENCOUNTER — Emergency Department (HOSPITAL_COMMUNITY): Payer: Medicare HMO

## 2014-09-05 ENCOUNTER — Encounter (HOSPITAL_COMMUNITY): Payer: Self-pay | Admitting: Emergency Medicine

## 2014-09-05 DIAGNOSIS — Z8673 Personal history of transient ischemic attack (TIA), and cerebral infarction without residual deficits: Secondary | ICD-10-CM | POA: Insufficient documentation

## 2014-09-05 DIAGNOSIS — Z8701 Personal history of pneumonia (recurrent): Secondary | ICD-10-CM | POA: Insufficient documentation

## 2014-09-05 DIAGNOSIS — F3289 Other specified depressive episodes: Secondary | ICD-10-CM | POA: Diagnosis not present

## 2014-09-05 DIAGNOSIS — R071 Chest pain on breathing: Secondary | ICD-10-CM | POA: Diagnosis not present

## 2014-09-05 DIAGNOSIS — M129 Arthropathy, unspecified: Secondary | ICD-10-CM | POA: Diagnosis not present

## 2014-09-05 DIAGNOSIS — IMO0002 Reserved for concepts with insufficient information to code with codable children: Secondary | ICD-10-CM | POA: Diagnosis not present

## 2014-09-05 DIAGNOSIS — Z87891 Personal history of nicotine dependence: Secondary | ICD-10-CM | POA: Insufficient documentation

## 2014-09-05 DIAGNOSIS — F411 Generalized anxiety disorder: Secondary | ICD-10-CM | POA: Diagnosis not present

## 2014-09-05 DIAGNOSIS — J449 Chronic obstructive pulmonary disease, unspecified: Secondary | ICD-10-CM | POA: Diagnosis not present

## 2014-09-05 DIAGNOSIS — Z7982 Long term (current) use of aspirin: Secondary | ICD-10-CM | POA: Diagnosis not present

## 2014-09-05 DIAGNOSIS — Z9981 Dependence on supplemental oxygen: Secondary | ICD-10-CM | POA: Diagnosis not present

## 2014-09-05 DIAGNOSIS — I10 Essential (primary) hypertension: Secondary | ICD-10-CM | POA: Diagnosis not present

## 2014-09-05 DIAGNOSIS — R0789 Other chest pain: Secondary | ICD-10-CM

## 2014-09-05 DIAGNOSIS — I252 Old myocardial infarction: Secondary | ICD-10-CM | POA: Diagnosis not present

## 2014-09-05 DIAGNOSIS — R52 Pain, unspecified: Secondary | ICD-10-CM | POA: Insufficient documentation

## 2014-09-05 DIAGNOSIS — Z79899 Other long term (current) drug therapy: Secondary | ICD-10-CM | POA: Diagnosis not present

## 2014-09-05 DIAGNOSIS — J4489 Other specified chronic obstructive pulmonary disease: Secondary | ICD-10-CM | POA: Insufficient documentation

## 2014-09-05 DIAGNOSIS — G589 Mononeuropathy, unspecified: Secondary | ICD-10-CM | POA: Insufficient documentation

## 2014-09-05 DIAGNOSIS — R079 Chest pain, unspecified: Secondary | ICD-10-CM | POA: Diagnosis present

## 2014-09-05 DIAGNOSIS — F329 Major depressive disorder, single episode, unspecified: Secondary | ICD-10-CM | POA: Diagnosis not present

## 2014-09-05 LAB — MAGNESIUM: Magnesium: 2.5 mg/dL (ref 1.5–2.5)

## 2014-09-05 LAB — COMPREHENSIVE METABOLIC PANEL
ALK PHOS: 78 U/L (ref 39–117)
ALT: 11 U/L (ref 0–35)
AST: 19 U/L (ref 0–37)
Albumin: 3.5 g/dL (ref 3.5–5.2)
Anion gap: 8 (ref 5–15)
BUN: 14 mg/dL (ref 6–23)
CHLORIDE: 99 meq/L (ref 96–112)
CO2: 29 mEq/L (ref 19–32)
Calcium: 9.4 mg/dL (ref 8.4–10.5)
Creatinine, Ser: 0.86 mg/dL (ref 0.50–1.10)
GFR calc non Af Amer: 59 mL/min — ABNORMAL LOW (ref >90)
GFR, EST AFRICAN AMERICAN: 68 mL/min — AB (ref >90)
GLUCOSE: 88 mg/dL (ref 70–99)
POTASSIUM: 4.2 meq/L (ref 3.7–5.3)
Sodium: 136 mEq/L — ABNORMAL LOW (ref 137–147)
Total Bilirubin: 0.7 mg/dL (ref 0.3–1.2)
Total Protein: 7 g/dL (ref 6.0–8.3)

## 2014-09-05 LAB — CBC
HCT: 35.8 % — ABNORMAL LOW (ref 36.0–46.0)
HEMOGLOBIN: 12.2 g/dL (ref 12.0–15.0)
MCH: 32.7 pg (ref 26.0–34.0)
MCHC: 34.1 g/dL (ref 30.0–36.0)
MCV: 96 fL (ref 78.0–100.0)
Platelets: 230 10*3/uL (ref 150–400)
RBC: 3.73 MIL/uL — AB (ref 3.87–5.11)
RDW: 13.6 % (ref 11.5–15.5)
WBC: 6 10*3/uL (ref 4.0–10.5)

## 2014-09-05 LAB — PROTIME-INR
INR: 1.11 (ref 0.00–1.49)
Prothrombin Time: 14.3 seconds (ref 11.6–15.2)

## 2014-09-05 LAB — TROPONIN I
Troponin I: <0.3 ng/mL (ref <0.30)
Troponin I: <0.3 ng/mL (ref <0.30)

## 2014-09-05 LAB — PRO B NATRIURETIC PEPTIDE: Pro B Natriuretic peptide (BNP): 215.3 pg/mL (ref 0–450)

## 2014-09-05 MED ORDER — PREDNISONE 20 MG PO TABS: 40.0000 mg | ORAL_TABLET | Freq: Every day | ORAL | Status: AC

## 2014-09-05 MED ORDER — METRONIDAZOLE IN NACL 5-0.79 MG/ML-% IV SOLN: 500.0000 mg | Freq: Once | INTRAVENOUS | Status: DC

## 2014-09-05 MED ORDER — HYDROCODONE-ACETAMINOPHEN 7.5-325 MG PO TABS: 1.0000 | ORAL_TABLET | Freq: Four times a day (QID) | ORAL | Status: DC | PRN

## 2014-09-05 MED ORDER — SODIUM CHLORIDE 0.9 % IV SOLN: Freq: Once | INTRAVENOUS | Status: DC

## 2014-09-05 MED ORDER — CIPROFLOXACIN IN D5W 400 MG/200ML IV SOLN: 400.0000 mg | Freq: Once | INTRAVENOUS | Status: DC

## 2014-09-05 MED ORDER — ACETAMINOPHEN 325 MG PO TABS
650.0000 mg | ORAL_TABLET | Freq: Once | ORAL | Status: AC
  Administered 2014-09-05: 650 mg via ORAL
  Filled 2014-09-05: qty 2

## 2014-09-05 MED ORDER — PREDNISONE 50 MG PO TABS
60.0000 mg | ORAL_TABLET | ORAL | Status: AC
  Administered 2014-09-05: 60 mg via ORAL
  Filled 2014-09-05 (×2): qty 1

## 2014-09-05 NOTE — ED Notes (Signed)
Patient ambulatory to restroom at this time. Portable O2 provided to patient. Steady gait, no deficits noted.

## 2014-09-05 NOTE — ED Notes (Signed)
Patient and patients family member verbalize understanding of discharge instructions, prescription medications and follow up care. At this time, patient discharged to waiting area with family member to wait for ride home.

## 2014-09-05 NOTE — ED Provider Notes (Signed)
CSN: [id number]     Arrival date & time 09/05/14  1647 History   First MD Initiated Contact with Patient 09/05/14 1658     Chief Complaint  Patient presents with  . Chest Pain      HPI  Patient presents with new left-sided chest pain.  The pain is focally about the left inferior anterior chest, nonradiating, sore, severe, not improved with narcotics. Pain is worse with coughing. The pain is nonexertional. There is associated nausea, anorexia, but no vomiting, diarrhea, fever, chills. Patient endorses a  Hx of smoking, though she is longer doing this. She also acknowledges a Hx of aneurism - and has been deemed to not be a surgical candidate.  Past Medical History  Diagnosis Date  . COPD (chronic obstructive pulmonary disease)   . Hypertension   . Anxiety   . Depression   . Arthritis   . AAA (abdominal aortic aneurysm)   . Neuropathy   . Leg swelling   . Oxygen dependent   . Myocardial infarction   . Pneumonia   . Stroke    Past Surgical History  Procedure Laterality Date  . Cholecystectomy    . Appendectomy     Family History  Problem Relation Age of Onset  . Diabetes Brother   . Cancer Brother     Throat cancer, Heart attack  . Heart disease Brother   . Heart attack Brother   . Cancer Father   . Cancer Sister    History  Substance Use Topics  . Smoking status: Former Smoker -- 50 years    Types: Cigarettes    Quit date: 12/31/2010  . Smokeless tobacco: Former Neurosurgeon    Quit date: 12/31/2010  . Alcohol Use: No   OB History   Grav Para Term Preterm Abortions TAB SAB Ect Mult Living                 Review of Systems  Constitutional:       Per HPI, otherwise negative  HENT:       Per HPI, otherwise negative  Respiratory:       Per HPI, otherwise negative  Cardiovascular:       Per HPI, otherwise negative  Gastrointestinal: Negative for vomiting.  Endocrine:       Negative aside from HPI  Genitourinary:       Neg aside from HPI   Musculoskeletal:         Per HPI, otherwise negative  Skin: Negative.   Neurological: Negative for syncope.      Allergies  Nsaids; Other; and Tomato  Home Medications   Prior to Admission medications   Medication Sig Start Date End Date Taking? Authorizing Provider  albuterol (PROVENTIL) (2.5 MG/3ML) 0.083% nebulizer solution Take 2.5 mg by nebulization 3 (three) times daily.    Yes Historical Provider, MD  aspirin EC 81 MG tablet Take 81 mg by mouth daily.   Yes Historical Provider, MD  budesonide-formoterol (SYMBICORT) 160-4.5 MCG/ACT inhaler Inhale 1 puff into the lungs daily.    Yes Historical Provider, MD  citalopram (CELEXA) 20 MG tablet Take 20 mg by mouth at bedtime.    Yes Historical Provider, MD  diazepam (VALIUM) 10 MG tablet Take 10 mg by mouth 3 (three) times daily as needed for anxiety.   Yes Historical Provider, MD  gabapentin (NEURONTIN) 100 MG tablet Take 100 mg by mouth 5 (five) times daily.    Yes Historical Provider, MD  HYDROcodone-acetaminophen (NORCO) 7.5-325 MG per  tablet Take 1 tablet by mouth every 4 (four) hours as needed for moderate pain.   Yes Historical Provider, MD  tiotropium (SPIRIVA) 18 MCG inhalation capsule Place 18 mcg into inhaler and inhale every evening.    Yes Historical Provider, MD  torsemide (DEMADEX) 20 MG tablet Take 20 mg by mouth every morning.    Yes Historical Provider, MD   BP 132/82  Pulse 63  Temp(Src) 97.9 F (36.6 C) (Oral)  Resp 13  Ht  (1.626 m)  Wt 150 lb (68.04 kg)  BMI 25.73 kg/m2  SpO2 96% Physical Exam  Nursing note and vitals reviewed. Constitutional: She is oriented to person, place, and time. She appears well-developed and well-nourished. She has a sickly appearance. No distress.  HENT:  Head: Normocephalic and atraumatic.  Eyes: Conjunctivae and EOM are normal.  Cardiovascular: Normal rate and regular rhythm.   Pulmonary/Chest: Effort normal and breath sounds normal. No stridor. No respiratory distress.    Abdominal:  She exhibits no distension. There is no tenderness.  Musculoskeletal: She exhibits no edema.  Neurological: She is alert and oriented to person, place, and time. No cranial nerve deficit.  Skin: Skin is warm and dry.  Psychiatric: She has a normal mood and affect.    ED Course  Procedures (including critical care time) Labs Review Labs Reviewed  CBC - Abnormal; Notable for the following:    RBC 3.73 (*)    HCT 35.8 (*)    All other components within normal limits  COMPREHENSIVE METABOLIC PANEL - Abnormal; Notable for the following:    Sodium 136 (*)    GFR calc non Af Amer 59 (*)    GFR calc Af Amer 68 (*)    All other components within normal limits  PRO B NATRIURETIC PEPTIDE  MAGNESIUM  PROTIME-INR  TROPONIN I  TROPONIN I    Imaging Review Dg Chest 2 View  09/05/2014   CLINICAL DATA:  Left-sided chest pain.  EXAM: CHEST  2 VIEW  COMPARISON:  01/24/2014  FINDINGS: Moderate cardiomegaly is stable as well as ectasia of the thoracic aorta. Changes of COPD are again seen. Elevation of left hemidiaphragm and left lower lung scarring are stable. No evidence of acute infiltrate or edema. No evidence of pleural effusion.  IMPRESSION: Stable cardiomegaly, COPD, and left lower lung scarring. No acute findings.   Electronically Signed   By: Myles Rosenthal M.D.   On: 09/05/2014 17:53     EKG Interpretation   Date/Time:  Sunday September 05 2014 16:54:10 EDT Ventricular Rate:  66 PR Interval:  231 QRS Duration: 88 QT Interval:  419 QTC Calculation: 439 R Axis:   53 Text Interpretation:  Sinus rhythm Prolonged PR interval Sinus rhythm  Artifact T wave abnormality No significant change since last tracing  Abnormal ekg Confirmed by Gerhard Munch  MD ([id number]) on 09/05/2014 5:00:04  PM     6:34 PM On re-eval the patient is in no distress.  She now endorses a Hx of pneumonia multiple times in the past. 9:22 PM Second troponin is unremarkable. I reviewed all findings with the patient and  2 family members. Vital signs are unremarkable, with no hypoxia, tachypnea, tachycardia.  MDM  Patient presents with pain in the left lower chest.  Given the tenderness to palpation, the history of pulmonary disease, the x-ray evidence of scarring, there is low suspicion for ongoing coronary ischemia, thromboembolic processes, or other acute new life-threatening pathology. Patient had some improvement of her pain her,  was started on pain medication, steroids, discharged in stable condition.    Gerhard Munch, MD 09/05/14 2123

## 2014-09-05 NOTE — ED Notes (Signed)
Pt states she has been coughing more than usual, chest pain lt side, tender to palpation.

## 2014-09-05 NOTE — Discharge Instructions (Signed)
As discussed, your evaluation today has been largely reassuring.  But, it is important that you monitor your condition carefully, and do not hesitate to return to the ED if you develop new, or concerning changes in your condition.  Your pain is likely due to a combination of your COPD and scarring from prior disease in your lungs.  Please follow-up with your physician for appropriate ongoing care.   Chest Wall Pain Chest wall pain is pain in or around the bones and muscles of your chest. It may take up to 6 weeks to get better. It may take longer if you must stay physically active in your work and activities.  CAUSES  Chest wall pain may happen on its own. However, it may be caused by:  A viral illness like the flu.  Injury.  Coughing.  Exercise.  Arthritis.  Fibromyalgia.  Shingles. HOME CARE INSTRUCTIONS   Avoid overtiring physical activity. Try not to strain or perform activities that cause pain. This includes any activities using your chest or your abdominal and side muscles, especially if heavy weights are used.  Put ice on the sore area.  Put ice in a plastic bag.  Place a towel between your skin and the bag.  Leave the ice on for 15-20 minutes per hour while awake for the first 2 days.  Only take over-the-counter or prescription medicines for pain, discomfort, or fever as directed by your caregiver. SEEK IMMEDIATE MEDICAL CARE IF:   Your pain increases, or you are very uncomfortable.  You have a fever.  Your chest pain becomes worse.  You have new, unexplained symptoms.  You have nausea or vomiting.  You feel sweaty or lightheaded.  You have a cough with phlegm (sputum), or you cough up blood. MAKE SURE YOU:   Understand these instructions.  Will watch your condition.  Will get help right away if you are not doing well or get worse. Document Released: 12/17/2005 Document Revised: 03/10/2012 Document Reviewed: 08/13/2011 [facility] Patient Information  2015 [place], [place]. This information is not intended to replace advice given to you by your health care provider. Make sure you discuss any questions you have with your health care provider.

## 2014-09-15 ENCOUNTER — Emergency Department (HOSPITAL_COMMUNITY): Payer: Medicare HMO

## 2014-09-15 ENCOUNTER — Encounter (HOSPITAL_COMMUNITY): Payer: Self-pay | Admitting: Emergency Medicine

## 2014-09-15 ENCOUNTER — Emergency Department (HOSPITAL_COMMUNITY)
Admission: EM | Admit: 2014-09-15 | Discharge: 2014-09-15 | Disposition: A | Discharge status: Home / Self Care | Payer: Medicare HMO | Attending: Emergency Medicine | Admitting: Emergency Medicine

## 2014-09-15 DIAGNOSIS — F329 Major depressive disorder, single episode, unspecified: Secondary | ICD-10-CM | POA: Diagnosis not present

## 2014-09-15 DIAGNOSIS — Z87891 Personal history of nicotine dependence: Secondary | ICD-10-CM | POA: Insufficient documentation

## 2014-09-15 DIAGNOSIS — I10 Essential (primary) hypertension: Secondary | ICD-10-CM | POA: Insufficient documentation

## 2014-09-15 DIAGNOSIS — IMO0002 Reserved for concepts with insufficient information to code with codable children: Secondary | ICD-10-CM | POA: Insufficient documentation

## 2014-09-15 DIAGNOSIS — Z9981 Dependence on supplemental oxygen: Secondary | ICD-10-CM | POA: Insufficient documentation

## 2014-09-15 DIAGNOSIS — S0100XA Unspecified open wound of scalp, initial encounter: Secondary | ICD-10-CM | POA: Insufficient documentation

## 2014-09-15 DIAGNOSIS — Z8701 Personal history of pneumonia (recurrent): Secondary | ICD-10-CM | POA: Diagnosis not present

## 2014-09-15 DIAGNOSIS — W108XXA Fall (on) (from) other stairs and steps, initial encounter: Secondary | ICD-10-CM | POA: Insufficient documentation

## 2014-09-15 DIAGNOSIS — W1809XA Striking against other object with subsequent fall, initial encounter: Secondary | ICD-10-CM | POA: Insufficient documentation

## 2014-09-15 DIAGNOSIS — I252 Old myocardial infarction: Secondary | ICD-10-CM | POA: Diagnosis not present

## 2014-09-15 DIAGNOSIS — M129 Arthropathy, unspecified: Secondary | ICD-10-CM | POA: Diagnosis not present

## 2014-09-15 DIAGNOSIS — Y9389 Activity, other specified: Secondary | ICD-10-CM | POA: Insufficient documentation

## 2014-09-15 DIAGNOSIS — Y9289 Other specified places as the place of occurrence of the external cause: Secondary | ICD-10-CM | POA: Diagnosis not present

## 2014-09-15 DIAGNOSIS — Z79899 Other long term (current) drug therapy: Secondary | ICD-10-CM | POA: Diagnosis not present

## 2014-09-15 DIAGNOSIS — J449 Chronic obstructive pulmonary disease, unspecified: Secondary | ICD-10-CM | POA: Insufficient documentation

## 2014-09-15 DIAGNOSIS — S0990XA Unspecified injury of head, initial encounter: Secondary | ICD-10-CM | POA: Insufficient documentation

## 2014-09-15 DIAGNOSIS — Z7982 Long term (current) use of aspirin: Secondary | ICD-10-CM | POA: Diagnosis not present

## 2014-09-15 DIAGNOSIS — G589 Mononeuropathy, unspecified: Secondary | ICD-10-CM | POA: Diagnosis not present

## 2014-09-15 DIAGNOSIS — F3289 Other specified depressive episodes: Secondary | ICD-10-CM | POA: Diagnosis not present

## 2014-09-15 DIAGNOSIS — F411 Generalized anxiety disorder: Secondary | ICD-10-CM | POA: Insufficient documentation

## 2014-09-15 DIAGNOSIS — Z8673 Personal history of transient ischemic attack (TIA), and cerebral infarction without residual deficits: Secondary | ICD-10-CM | POA: Insufficient documentation

## 2014-09-15 DIAGNOSIS — J4489 Other specified chronic obstructive pulmonary disease: Secondary | ICD-10-CM | POA: Insufficient documentation

## 2014-09-15 MED ORDER — ONDANSETRON 4 MG PO TBDP
4.0000 mg | ORAL_TABLET | Freq: Once | ORAL | Status: AC
  Administered 2014-09-15: 4 mg via ORAL
  Filled 2014-09-15: qty 1

## 2014-09-15 MED ORDER — ONDANSETRON 4 MG PO TBDP
ORAL_TABLET | ORAL | Status: DC
Start: 2014-09-15 — End: 2014-09-15
  Filled 2014-09-15: qty 1

## 2014-09-15 MED ORDER — ONDANSETRON 4 MG PO TBDP: ORAL_TABLET | ORAL | Status: DC

## 2014-09-15 NOTE — ED Notes (Signed)
Pt states she was going up the steps, her foot slipped and she fell into a door. Per EMS, pt has lac to back of head

## 2014-09-15 NOTE — Discharge Instructions (Signed)
Sutures out in 1 week.  Rest at home for 2 days.

## 2014-09-15 NOTE — ED Notes (Signed)
Family at bedside. Patient and family upset that they have been here for over two hours and no EDP had been in to see her. Also wanting to take neck brace off. Explained to them that until Dr sees her and she has xrays done we can not.

## 2014-09-15 NOTE — ED Notes (Signed)
Pt states she needs to use the restroom. Pt placed on bedpan.

## 2014-09-15 NOTE — ED Provider Notes (Signed)
CSN: [id number]     Arrival date & time 09/15/14  1658 History   First MD Initiated Contact with Patient 09/15/14 1933     Chief Complaint  Patient presents with  . Fall     (Consider location/radiation/quality/duration/timing/severity/associated sxs/prior Treatment) Patient is a 80 y.o. female presenting with fall. The history is provided by the patient (the pt fell on some step and hit her head no loc).  Fall This is a new problem. The current episode started less than 1 hour ago. The problem occurs constantly. The problem has not changed since onset.Associated symptoms include headaches. Pertinent negatives include no chest pain and no abdominal pain. Nothing aggravates the symptoms. Nothing relieves the symptoms.    Past Medical History  Diagnosis Date  . COPD (chronic obstructive pulmonary disease)   . Hypertension   . Anxiety   . Depression   . Arthritis   . AAA (abdominal aortic aneurysm)   . Neuropathy   . Leg swelling   . Oxygen dependent   . Myocardial infarction   . Pneumonia   . Stroke    Past Surgical History  Procedure Laterality Date  . Cholecystectomy    . Appendectomy     Family History  Problem Relation Age of Onset  . Diabetes Brother   . Cancer Brother     Throat cancer, Heart attack  . Heart disease Brother   . Heart attack Brother   . Cancer Father   . Cancer Sister    History  Substance Use Topics  . Smoking status: Former Smoker -- 50 years    Types: Cigarettes    Quit date: 12/31/2010  . Smokeless tobacco: Former Neurosurgeon    Quit date: 12/31/2010  . Alcohol Use: No   OB History   Grav Para Term Preterm Abortions TAB SAB Ect Mult Living                 Review of Systems  Constitutional: Negative for appetite change and fatigue.  HENT: Negative for congestion, ear discharge and sinus pressure.   Eyes: Negative for discharge.  Respiratory: Negative for cough.   Cardiovascular: Negative for chest pain.  Gastrointestinal: Negative for  abdominal pain and diarrhea.  Genitourinary: Negative for frequency and hematuria.  Musculoskeletal: Negative for back pain.  Skin: Negative for rash.  Neurological: Positive for headaches. Negative for seizures.  Psychiatric/Behavioral: Negative for hallucinations.      Allergies  Nsaids; Other; and Tomato  Home Medications   Prior to Admission medications   Medication Sig Start Date End Date Taking? Authorizing Provider  albuterol (PROVENTIL) (2.5 MG/3ML) 0.083% nebulizer solution Take 2.5 mg by nebulization 3 (three) times daily.    Yes Historical Provider, MD  aspirin EC 81 MG tablet Take 81 mg by mouth daily.   Yes Historical Provider, MD  budesonide-formoterol (SYMBICORT) 160-4.5 MCG/ACT inhaler Inhale 2 puffs into the lungs 2 (two) times daily.    Yes Historical Provider, MD  citalopram (CELEXA) 20 MG tablet Take 20 mg by mouth at bedtime.    Yes Historical Provider, MD  diazepam (VALIUM) 10 MG tablet Take 10 mg by mouth 3 (three) times daily as needed for anxiety.   Yes Historical Provider, MD  gabapentin (NEURONTIN) 100 MG tablet Take 100 mg by mouth 5 (five) times daily.    Yes Historical Provider, MD  HYDROcodone-acetaminophen (NORCO) 7.5-325 MG per tablet Take 1 tablet by mouth every 6 (six) hours as needed for moderate pain. 09/05/14  Yes Gerhard Munch,  MD  tiotropium (SPIRIVA) 18 MCG inhalation capsule Place 18 mcg into inhaler and inhale every evening.    Yes Historical Provider, MD  torsemide (DEMADEX) 20 MG tablet Take 20 mg by mouth every morning.    Yes Historical Provider, MD  ondansetron (ZOFRAN ODT) 4 MG disintegrating tablet  ODT q4 hours prn nausea/vomit 09/15/14   Benny Lennert, MD   BP 129/72  Pulse 58  Temp(Src) 97.6 F (36.4 C) (Axillary)  Resp 18  Ht  (1.549 m)  Wt 146 lb (66.225 kg)  BMI 27.60 kg/m2  SpO2 97% Physical Exam  Constitutional: She is oriented to person, place, and time. She appears well-developed.  HENT:  Head: Normocephalic.   3 cm lac to occipital head  Eyes: Conjunctivae and EOM are normal. No scleral icterus.  Neck: Neck supple. No thyromegaly present.  Cardiovascular: Normal rate and regular rhythm.  Exam reveals no gallop and no friction rub.   No murmur heard. Pulmonary/Chest: No stridor. She has no wheezes. She has no rales. She exhibits no tenderness.  Abdominal: She exhibits no distension. There is no tenderness. There is no rebound.  Musculoskeletal: Normal range of motion. She exhibits no edema.  Lymphadenopathy:    She has no cervical adenopathy.  Neurological: She is oriented to person, place, and time. She exhibits normal muscle tone. Coordination normal.  Skin: No rash noted. No erythema.  Psychiatric: She has a normal mood and affect. Her behavior is normal.    ED Course  LACERATION REPAIR Date/Time: 09/15/2014 9:38 PM Performed by: Detrell Umscheid L Authorized by: Bunny Kleist L Comments: 3 cm lac to back of head.  Area cleaned with betadine.   4 staples used to close the laceration   (including critical care time) Labs Review Labs Reviewed - No data to display  Imaging Review Ct Head Wo Contrast  09/15/2014   CLINICAL DATA:  Fall, hit head  EXAM: CT HEAD WITHOUT CONTRAST  CT CERVICAL SPINE WITHOUT CONTRAST  TECHNIQUE: Multidetector CT imaging of the head and cervical spine was performed following the standard protocol without intravenous contrast. Multiplanar CT image reconstructions of the cervical spine were also generated.  COMPARISON:  Head CT 01/24/2014  FINDINGS: CT HEAD FINDINGS  Remote bilateral basal ganglia lacunar infarcts are reidentified. Mild cortical volume loss noted with proportional ventricular prominence. Areas of periventricular white matter hypodensity are most compatible with small vessel ischemic change. No acute hemorrhage, infarct, or mass lesion is identified. Right posterior occipital scalp swelling noted. Focal lentiform scalp hyperdensity likely indicates  hematoma formation.  CT CERVICAL SPINE FINDINGS  C1 through the cervicothoracic junction is visualized in its entirety. No precervical soft tissue widening. Reversal of the normal cervical lordosis at C5-C6 is identified with moderate disc degenerative change predominantly at this level and to a lesser extent at C6-C7, causing neural foraminal narrowing at these levels. No fracture or dislocation is identified. Soft tissue density material within the external auditory canals is most compatible with cerumen probable C1 bone island image 33. Multilevel mild facet osteoarthritic change.  IMPRESSION: No acute intracranial abnormality.  Right posterior scalp swelling/hematoma.  No acute cervical spine fracture or dislocation.   Electronically Signed   By: Christiana Pellant M.D.   On: 09/15/2014 21:21   Ct Cervical Spine Wo Contrast  09/15/2014   CLINICAL DATA:  Fall, hit head  EXAM: CT HEAD WITHOUT CONTRAST  CT CERVICAL SPINE WITHOUT CONTRAST  TECHNIQUE: Multidetector CT imaging of the head and cervical spine was  performed following the standard protocol without intravenous contrast. Multiplanar CT image reconstructions of the cervical spine were also generated.  COMPARISON:  Head CT 01/24/2014  FINDINGS: CT HEAD FINDINGS  Remote bilateral basal ganglia lacunar infarcts are reidentified. Mild cortical volume loss noted with proportional ventricular prominence. Areas of periventricular white matter hypodensity are most compatible with small vessel ischemic change. No acute hemorrhage, infarct, or mass lesion is identified. Right posterior occipital scalp swelling noted. Focal lentiform scalp hyperdensity likely indicates hematoma formation.  CT CERVICAL SPINE FINDINGS  C1 through the cervicothoracic junction is visualized in its entirety. No precervical soft tissue widening. Reversal of the normal cervical lordosis at C5-C6 is identified with moderate disc degenerative change predominantly at this level and to a lesser  extent at C6-C7, causing neural foraminal narrowing at these levels. No fracture or dislocation is identified. Soft tissue density material within the external auditory canals is most compatible with cerumen probable C1 bone island image 33. Multilevel mild facet osteoarthritic change.  IMPRESSION: No acute intracranial abnormality.  Right posterior scalp swelling/hematoma.  No acute cervical spine fracture or dislocation.   Electronically Signed   By: Christiana Pellant M.D.   On: 09/15/2014 21:21     EKG Interpretation None      MDM   Final diagnoses:  Head injury, initial encounter        Benny Lennert, MD 09/15/14 2139

## 2014-10-26 ENCOUNTER — Emergency Department (HOSPITAL_COMMUNITY)
Admission: EM | Admit: 2014-10-26 | Discharge: 2014-10-26 | Disposition: A | Discharge status: Home / Self Care | Payer: Medicare HMO | Attending: Emergency Medicine | Admitting: Emergency Medicine

## 2014-10-26 ENCOUNTER — Encounter (HOSPITAL_COMMUNITY): Payer: Self-pay | Admitting: Emergency Medicine

## 2014-10-26 ENCOUNTER — Emergency Department (HOSPITAL_COMMUNITY): Payer: Medicare HMO

## 2014-10-26 DIAGNOSIS — Z9049 Acquired absence of other specified parts of digestive tract: Secondary | ICD-10-CM | POA: Diagnosis not present

## 2014-10-26 DIAGNOSIS — Z87891 Personal history of nicotine dependence: Secondary | ICD-10-CM | POA: Diagnosis not present

## 2014-10-26 DIAGNOSIS — R079 Chest pain, unspecified: Secondary | ICD-10-CM | POA: Diagnosis not present

## 2014-10-26 DIAGNOSIS — Z9089 Acquired absence of other organs: Secondary | ICD-10-CM | POA: Insufficient documentation

## 2014-10-26 DIAGNOSIS — I10 Essential (primary) hypertension: Secondary | ICD-10-CM | POA: Insufficient documentation

## 2014-10-26 DIAGNOSIS — J441 Chronic obstructive pulmonary disease with (acute) exacerbation: Secondary | ICD-10-CM | POA: Diagnosis not present

## 2014-10-26 DIAGNOSIS — Z9981 Dependence on supplemental oxygen: Secondary | ICD-10-CM | POA: Diagnosis not present

## 2014-10-26 DIAGNOSIS — F329 Major depressive disorder, single episode, unspecified: Secondary | ICD-10-CM | POA: Insufficient documentation

## 2014-10-26 DIAGNOSIS — F419 Anxiety disorder, unspecified: Secondary | ICD-10-CM | POA: Insufficient documentation

## 2014-10-26 DIAGNOSIS — M1990 Unspecified osteoarthritis, unspecified site: Secondary | ICD-10-CM | POA: Insufficient documentation

## 2014-10-26 DIAGNOSIS — Z8673 Personal history of transient ischemic attack (TIA), and cerebral infarction without residual deficits: Secondary | ICD-10-CM | POA: Diagnosis not present

## 2014-10-26 DIAGNOSIS — Z7982 Long term (current) use of aspirin: Secondary | ICD-10-CM | POA: Diagnosis not present

## 2014-10-26 DIAGNOSIS — Z79899 Other long term (current) drug therapy: Secondary | ICD-10-CM | POA: Diagnosis not present

## 2014-10-26 DIAGNOSIS — Z8701 Personal history of pneumonia (recurrent): Secondary | ICD-10-CM | POA: Insufficient documentation

## 2014-10-26 DIAGNOSIS — I252 Old myocardial infarction: Secondary | ICD-10-CM | POA: Insufficient documentation

## 2014-10-26 DIAGNOSIS — R109 Unspecified abdominal pain: Secondary | ICD-10-CM | POA: Diagnosis not present

## 2014-10-26 DIAGNOSIS — Z8669 Personal history of other diseases of the nervous system and sense organs: Secondary | ICD-10-CM | POA: Diagnosis not present

## 2014-10-26 LAB — BASIC METABOLIC PANEL
Anion gap: 13 (ref 5–15)
BUN: 16 mg/dL (ref 6–23)
CHLORIDE: 94 meq/L — AB (ref 96–112)
CO2: 29 mEq/L (ref 19–32)
Calcium: 9.7 mg/dL (ref 8.4–10.5)
Creatinine, Ser: 0.89 mg/dL (ref 0.50–1.10)
GFR, EST AFRICAN AMERICAN: 65 mL/min — AB (ref >90)
GFR, EST NON AFRICAN AMERICAN: 56 mL/min — AB (ref >90)
Glucose, Bld: 95 mg/dL (ref 70–99)
POTASSIUM: 3.8 meq/L (ref 3.7–5.3)
SODIUM: 136 meq/L — AB (ref 137–147)

## 2014-10-26 LAB — CBC
HCT: 37.4 % (ref 36.0–46.0)
HEMOGLOBIN: 12.5 g/dL (ref 12.0–15.0)
MCH: 32.1 pg (ref 26.0–34.0)
MCHC: 33.4 g/dL (ref 30.0–36.0)
MCV: 96.1 fL (ref 78.0–100.0)
Platelets: 290 10*3/uL (ref 150–400)
RBC: 3.89 MIL/uL (ref 3.87–5.11)
RDW: 13.6 % (ref 11.5–15.5)
WBC: 6.8 10*3/uL (ref 4.0–10.5)

## 2014-10-26 LAB — PRO B NATRIURETIC PEPTIDE: Pro B Natriuretic peptide (BNP): 500 pg/mL — ABNORMAL HIGH (ref 0–450)

## 2014-10-26 LAB — TROPONIN I: Troponin I: <0.3 ng/mL (ref <0.30)

## 2014-10-26 MED ORDER — HYDROMORPHONE HCL 1 MG/ML IJ SOLN
0.5000 mg | Freq: Once | INTRAMUSCULAR | Status: AC
Start: 2014-10-26 — End: 2014-10-26
  Administered 2014-10-26: 0.5 mg via INTRAVENOUS
  Filled 2014-10-26: qty 1

## 2014-10-26 NOTE — Discharge Instructions (Signed)

## 2014-10-26 NOTE — ED Provider Notes (Signed)
CSN: [id number]     Arrival date & time 10/26/14  1353 History  This chart was scribed for American Expressathan R. Rubin PayorPickering, MD by Annye AsaAnna Dorsett, ED Scribe. This patient was seen in room [facility] and the patient's care was started at 2:41 PM.    Chief Complaint  Patient presents with  . Chest Pain  . Shortness of Breath   Patient is a 80 y.o. female presenting with shortness of breath. The history is provided by the patient. No language interpreter was used.  Shortness of Breath Associated symptoms: abdominal pain and chest pain   Associated symptoms: no rash     HPI Comments: Melanie Lowe is a 80 y.o. female who presents to the Emergency Department complaining of 2 days of constant left sided chest and abdominal pain, worsened with breathing. She reports associated SOB. She reports dizziness. Patient explains that she has one prior experience with these symptoms; she came to the ED and was discharged without any significant diagnoses she can recall.   She has AA and COPD (3.5 L/min home O2). She is on hydrocodone for her chronic arthritis pain.   Past Medical History  Diagnosis Date  . COPD (chronic obstructive pulmonary disease)   . Hypertension   . Anxiety   . Depression   . Arthritis   . AAA (abdominal aortic aneurysm)   . Neuropathy   . Leg swelling   . Oxygen dependent   . Myocardial infarction   . Pneumonia   . Stroke    Past Surgical History  Procedure Laterality Date  . Cholecystectomy    . Appendectomy     Family History  Problem Relation Age of Onset  . Diabetes Brother   . Cancer Brother     Throat cancer, Heart attack  . Heart disease Brother   . Heart attack Brother   . Cancer Father   . Cancer Sister    History  Substance Use Topics  . Smoking status: Former Smoker -- 50 years    Types: Cigarettes    Quit date: 12/31/2010  . Smokeless tobacco: Former NeurosurgeonUser    Quit date: 12/31/2010  . Alcohol Use: No   OB History   Grav Para Term Preterm Abortions TAB SAB  Ect Mult Living                 Review of Systems  Respiratory: Positive for shortness of breath.   Cardiovascular: Positive for chest pain.  Gastrointestinal: Positive for abdominal pain.  Skin: Negative for rash.  Neurological: Positive for dizziness.      Allergies  Nsaids; Other; and Tomato  Home Medications   Prior to Admission medications   Medication Sig Start Date End Date Taking? Authorizing Provider  albuterol (PROVENTIL) (2.5 MG/3ML) 0.083% nebulizer solution Take 2.5 mg by nebulization 3 (three) times daily.     Historical Provider, MD  aspirin EC 81 MG tablet Take 81 mg by mouth daily.    Historical Provider, MD  budesonide-formoterol (SYMBICORT) 160-4.5 MCG/ACT inhaler Inhale 2 puffs into the lungs 2 (two) times daily.     Historical Provider, MD  citalopram (CELEXA) 20 MG tablet Take 20 mg by mouth at bedtime.     Historical Provider, MD  diazepam (VALIUM) 10 MG tablet Take 10 mg by mouth 3 (three) times daily as needed for anxiety.    Historical Provider, MD  gabapentin (NEURONTIN) 100 MG tablet Take 100 mg by mouth 5 (five) times daily.     Historical Provider, MD  HYDROcodone-acetaminophen (NORCO) 7.5-325 MG per tablet Take 1 tablet by mouth every 6 (six) hours as needed for moderate pain. 09/05/14   Gerhard Munch, MD  ondansetron (ZOFRAN ODT) 4 MG disintegrating tablet 4mg  ODT q4 hours prn nausea/vomit 09/15/14   Benny Lennert, MD  tiotropium (SPIRIVA) 18 MCG inhalation capsule Place 18 mcg into inhaler and inhale every evening.     Historical Provider, MD  torsemide (DEMADEX) 20 MG tablet Take 20 mg by mouth every morning.     Historical Provider, MD   BP 114/70  Pulse 57  Temp(Src) 98 F (36.7 C) (Oral)  Resp 12  SpO2 92% Physical Exam  Nursing note and vitals reviewed. Constitutional: She is oriented to person, place, and time. She appears well-developed and well-nourished.  HENT:  Head: Normocephalic and atraumatic.  Neck: No tracheal deviation  present.  Cardiovascular: Normal rate and intact distal pulses.   Good pulses in feet  Pulmonary/Chest: Effort normal and breath sounds normal. No respiratory distress. She has no wheezes. She has no rales.  Musculoskeletal:  Tender on anterior chest, lateral chest, left abdomen, left hip, mid abdomen; moving her left hip without issue  Neurological: She is alert and oriented to person, place, and time.  Skin: Skin is warm and dry. No rash noted.  Psychiatric: She has a normal mood and affect. Her behavior is normal.    ED Course  Procedures   DIAGNOSTIC STUDIES: Oxygen Saturation is 98% on RA, normal by my interpretation.    COORDINATION OF CARE: 2:45 PM Discussed treatment plan with pt at bedside and pt agreed to plan.   Labs Review Labs Reviewed  PRO B NATRIURETIC PEPTIDE - Abnormal; Notable for the following:    Pro B Natriuretic peptide (BNP) 500.0 (*)    All other components within normal limits  BASIC METABOLIC PANEL - Abnormal; Notable for the following:    Sodium 136 (*)    Chloride 94 (*)    GFR calc non Af Amer 56 (*)    GFR calc Af Amer 65 (*)    All other components within normal limits  CBC  TROPONIN I    Imaging Review Dg Chest Port 1 View  10/26/2014   CLINICAL DATA:  Left-sided chest pain that radiates into the left arm and into the left flank. Dizziness and shortness of breath.  EXAM: PORTABLE CHEST - 1 VIEW  COMPARISON:  09/05/2014 and 01/24/2014 and chest CT dated 06/20/2013  FINDINGS: There is tortuosity and calcification of the thoracic aorta. Heart size and pulmonary vascularity are normal. Slight scarring at both lung bases. Chronic elevation of the left hemidiaphragm. No effusions or infiltrates. No acute osseous abnormality. Chronic degenerative changes of both shoulders.  IMPRESSION: No acute abnormalities.   Electronically Signed   By: Geanie Cooley M.D.   On: 10/26/2014 14:37     EKG Interpretation   Date/Time:  Tuesday October 26 2014 14:03:13  EDT Ventricular Rate:  58 PR Interval:  214 QRS Duration: 89 QT Interval:  442 QTC Calculation: 434 R Axis:   44 Text Interpretation:  Sinus rhythm Borderline prolonged PR interval No  significant change since last tracing Confirmed by Shaakira Borrero  MD, Harrold Donath  [phone]) on 10/26/2014 2:46:18 PM      MDM   Final diagnoses:  Left sided chest pain    Left chest and abdominal pain. Chronic pains. Patient was worried about pneumonia. Negative xrays. Has AAA but not surgical candidate. Patient has chronic pain meds at home, but  states she is going to run out early. Will have follow with PCP    Juliet Rude. Rubin Payor, MD 10/28/14 [phone]

## 2014-10-26 NOTE — ED Notes (Signed)
Patient c/o left side chest pain that radaites under left arm and into left flank. Per family patient woke this morning with neck pain and has had pain "under left arm" x2 days. Patient reports dizziness and shortness of breath. Patient has AA and COPD.

## 2014-11-02 ENCOUNTER — Other Ambulatory Visit (HOSPITAL_COMMUNITY): Payer: Self-pay | Admitting: Pulmonary Disease

## 2014-11-02 DIAGNOSIS — Z1231 Encounter for screening mammogram for malignant neoplasm of breast: Secondary | ICD-10-CM

## 2014-11-04 ENCOUNTER — Ambulatory Visit (HOSPITAL_COMMUNITY): Payer: Medicare HMO

## 2014-11-10 ENCOUNTER — Ambulatory Visit (HOSPITAL_COMMUNITY): Payer: Medicare HMO

## 2014-11-11 ENCOUNTER — Emergency Department (HOSPITAL_COMMUNITY): Payer: Medicare HMO

## 2014-11-11 ENCOUNTER — Inpatient Hospital Stay (HOSPITAL_COMMUNITY)
Admission: EM | Admit: 2014-11-11 | Discharge: 2014-11-16 | DRG: 071 | Disposition: A | Discharge status: Home / Self Care | Payer: Medicare HMO | Attending: Pulmonary Disease | Admitting: Pulmonary Disease

## 2014-11-11 ENCOUNTER — Encounter (HOSPITAL_COMMUNITY): Payer: Self-pay | Admitting: Radiology

## 2014-11-11 DIAGNOSIS — Z66 Do not resuscitate: Secondary | ICD-10-CM | POA: Diagnosis not present

## 2014-11-11 DIAGNOSIS — M1990 Unspecified osteoarthritis, unspecified site: Secondary | ICD-10-CM | POA: Diagnosis present

## 2014-11-11 DIAGNOSIS — Z8249 Family history of ischemic heart disease and other diseases of the circulatory system: Secondary | ICD-10-CM

## 2014-11-11 DIAGNOSIS — Z87440 Personal history of urinary (tract) infections: Secondary | ICD-10-CM | POA: Diagnosis not present

## 2014-11-11 DIAGNOSIS — I252 Old myocardial infarction: Secondary | ICD-10-CM

## 2014-11-11 DIAGNOSIS — Z79891 Long term (current) use of opiate analgesic: Secondary | ICD-10-CM | POA: Diagnosis not present

## 2014-11-11 DIAGNOSIS — I10 Essential (primary) hypertension: Secondary | ICD-10-CM | POA: Diagnosis present

## 2014-11-11 DIAGNOSIS — E860 Dehydration: Secondary | ICD-10-CM | POA: Diagnosis present

## 2014-11-11 DIAGNOSIS — I714 Abdominal aortic aneurysm, without rupture, unspecified: Secondary | ICD-10-CM | POA: Diagnosis present

## 2014-11-11 DIAGNOSIS — R001 Bradycardia, unspecified: Secondary | ICD-10-CM | POA: Diagnosis present

## 2014-11-11 DIAGNOSIS — R531 Weakness: Secondary | ICD-10-CM

## 2014-11-11 DIAGNOSIS — J9611 Chronic respiratory failure with hypoxia: Secondary | ICD-10-CM

## 2014-11-11 DIAGNOSIS — J449 Chronic obstructive pulmonary disease, unspecified: Secondary | ICD-10-CM | POA: Diagnosis present

## 2014-11-11 DIAGNOSIS — Z808 Family history of malignant neoplasm of other organs or systems: Secondary | ICD-10-CM | POA: Diagnosis not present

## 2014-11-11 DIAGNOSIS — Z792 Long term (current) use of antibiotics: Secondary | ICD-10-CM

## 2014-11-11 DIAGNOSIS — E876 Hypokalemia: Secondary | ICD-10-CM | POA: Diagnosis not present

## 2014-11-11 DIAGNOSIS — G9340 Encephalopathy, unspecified: Principal | ICD-10-CM

## 2014-11-11 DIAGNOSIS — R109 Unspecified abdominal pain: Secondary | ICD-10-CM

## 2014-11-11 DIAGNOSIS — Z8673 Personal history of transient ischemic attack (TIA), and cerebral infarction without residual deficits: Secondary | ICD-10-CM | POA: Diagnosis not present

## 2014-11-11 DIAGNOSIS — Z79899 Other long term (current) drug therapy: Secondary | ICD-10-CM

## 2014-11-11 DIAGNOSIS — Z833 Family history of diabetes mellitus: Secondary | ICD-10-CM

## 2014-11-11 DIAGNOSIS — I712 Thoracic aortic aneurysm, without rupture: Secondary | ICD-10-CM | POA: Diagnosis not present

## 2014-11-11 DIAGNOSIS — Z87891 Personal history of nicotine dependence: Secondary | ICD-10-CM | POA: Diagnosis not present

## 2014-11-11 DIAGNOSIS — Z7982 Long term (current) use of aspirin: Secondary | ICD-10-CM | POA: Diagnosis not present

## 2014-11-11 DIAGNOSIS — Z9981 Dependence on supplemental oxygen: Secondary | ICD-10-CM | POA: Diagnosis not present

## 2014-11-11 DIAGNOSIS — R627 Adult failure to thrive: Secondary | ICD-10-CM | POA: Insufficient documentation

## 2014-11-11 DIAGNOSIS — R4182 Altered mental status, unspecified: Secondary | ICD-10-CM

## 2014-11-11 LAB — CBC
HCT: 36.1 % (ref 36.0–46.0)
HEMOGLOBIN: 12 g/dL (ref 12.0–15.0)
MCH: 30.7 pg (ref 26.0–34.0)
MCHC: 33.2 g/dL (ref 30.0–36.0)
MCV: 92.3 fL (ref 78.0–100.0)
Platelets: 380 10*3/uL (ref 150–400)
RBC: 3.91 MIL/uL (ref 3.87–5.11)
RDW: 13 % (ref 11.5–15.5)
WBC: 7.8 10*3/uL (ref 4.0–10.5)

## 2014-11-11 LAB — URINALYSIS, ROUTINE W REFLEX MICROSCOPIC
BILIRUBIN URINE: NEGATIVE
Glucose, UA: NEGATIVE mg/dL
Hgb urine dipstick: NEGATIVE
KETONES UR: NEGATIVE mg/dL
Leukocytes, UA: NEGATIVE
NITRITE: NEGATIVE
PH: 5 (ref 5.0–8.0)
PROTEIN: NEGATIVE mg/dL
Specific Gravity, Urine: 1.025 (ref 1.005–1.030)
UROBILINOGEN UA: 0.2 mg/dL (ref 0.0–1.0)

## 2014-11-11 LAB — BASIC METABOLIC PANEL
Anion gap: 14 (ref 5–15)
BUN: 17 mg/dL (ref 6–23)
CHLORIDE: 89 meq/L — AB (ref 96–112)
CO2: 32 meq/L (ref 19–32)
Calcium: 9.9 mg/dL (ref 8.4–10.5)
Creatinine, Ser: 0.99 mg/dL (ref 0.50–1.10)
GFR calc Af Amer: 57 mL/min — ABNORMAL LOW (ref >90)
GFR calc non Af Amer: 49 mL/min — ABNORMAL LOW (ref >90)
Glucose, Bld: 114 mg/dL — ABNORMAL HIGH (ref 70–99)
Potassium: 3 mEq/L — ABNORMAL LOW (ref 3.7–5.3)
SODIUM: 135 meq/L — AB (ref 137–147)

## 2014-11-11 LAB — I-STAT CHEM 8, ED
BUN: 15 mg/dL (ref 6–23)
CHLORIDE: 90 meq/L — AB (ref 96–112)
Calcium, Ion: 1.19 mmol/L (ref 1.13–1.30)
Creatinine, Ser: 1 mg/dL (ref 0.50–1.10)
GLUCOSE: 114 mg/dL — AB (ref 70–99)
HEMATOCRIT: 35 % — AB (ref 36.0–46.0)
Hemoglobin: 11.9 g/dL — ABNORMAL LOW (ref 12.0–15.0)
Potassium: 2.9 mEq/L — CL (ref 3.7–5.3)
Sodium: 133 mEq/L — ABNORMAL LOW (ref 137–147)
TCO2: 33 mmol/L (ref 0–100)

## 2014-11-11 LAB — BLOOD GAS, ARTERIAL
Acid-Base Excess: 9 mmol/L — ABNORMAL HIGH (ref 0.0–2.0)
BICARBONATE: 32.8 meq/L — AB (ref 20.0–24.0)
Drawn by: 27407
O2 Content: 4 L/min
O2 Saturation: 95 %
PO2 ART: 72.4 mmHg — AB (ref 80.0–100.0)
Patient temperature: 37
TCO2: 29.2 mmol/L (ref 0–100)
pCO2 arterial: 43.1 mmHg (ref 35.0–45.0)
pH, Arterial: 7.493 — ABNORMAL HIGH (ref 7.350–7.450)

## 2014-11-11 LAB — PROTIME-INR
INR: 1.34 (ref 0.00–1.49)
PROTHROMBIN TIME: 16.7 s — AB (ref 11.6–15.2)

## 2014-11-11 LAB — TROPONIN I

## 2014-11-11 LAB — LIPASE, BLOOD: LIPASE: 15 U/L (ref 11–59)

## 2014-11-11 LAB — I-STAT CG4 LACTIC ACID, ED: Lactic Acid, Venous: 1.3 mmol/L (ref 0.5–2.2)

## 2014-11-11 MED ORDER — POTASSIUM CHLORIDE CRYS ER 20 MEQ PO TBCR
40.0000 meq | EXTENDED_RELEASE_TABLET | Freq: Once | ORAL | Status: AC
  Administered 2014-11-11: 40 meq via ORAL
  Filled 2014-11-11: qty 2

## 2014-11-11 MED ORDER — TORSEMIDE 20 MG PO TABS
20.0000 mg | ORAL_TABLET | Freq: Every morning | ORAL | Status: DC
  Administered 2014-11-12 – 2014-11-15 (×4): 20 mg via ORAL
  Filled 2014-11-11 (×4): qty 1

## 2014-11-11 MED ORDER — ALUM & MAG HYDROXIDE-SIMETH 200-200-20 MG/5ML PO SUSP: 30.0000 mL | Freq: Four times a day (QID) | ORAL | Status: DC | PRN

## 2014-11-11 MED ORDER — PHENOL 1.4 % MT LIQD: 1.0000 | OROMUCOSAL | Status: DC | PRN

## 2014-11-11 MED ORDER — MORPHINE SULFATE 2 MG/ML IJ SOLN
2.0000 mg | INTRAMUSCULAR | Status: DC | PRN
Start: 2014-11-11 — End: 2014-11-16

## 2014-11-11 MED ORDER — IOHEXOL 350 MG/ML SOLN
100.0000 mL | Freq: Once | INTRAVENOUS | Status: AC | PRN
  Administered 2014-11-11: 100 mL via INTRAVENOUS

## 2014-11-11 MED ORDER — GABAPENTIN 100 MG PO CAPS
100.0000 mg | ORAL_CAPSULE | Freq: Three times a day (TID) | ORAL | Status: DC
  Administered 2014-11-11 – 2014-11-15 (×12): 100 mg via ORAL
  Filled 2014-11-11 (×12): qty 1

## 2014-11-11 MED ORDER — ONDANSETRON HCL 4 MG/2ML IJ SOLN: 4.0000 mg | Freq: Four times a day (QID) | INTRAMUSCULAR | Status: DC | PRN

## 2014-11-11 MED ORDER — CITALOPRAM HYDROBROMIDE 20 MG PO TABS
20.0000 mg | ORAL_TABLET | Freq: Every day | ORAL | Status: DC
  Administered 2014-11-12 – 2014-11-14 (×3): 20 mg via ORAL
  Filled 2014-11-11 (×3): qty 1

## 2014-11-11 MED ORDER — ALBUTEROL SULFATE (2.5 MG/3ML) 0.083% IN NEBU: 2.5000 mg | INHALATION_SOLUTION | RESPIRATORY_TRACT | Status: DC | PRN

## 2014-11-11 MED ORDER — DIAZEPAM 5 MG PO TABS: 5.0000 mg | ORAL_TABLET | Freq: Three times a day (TID) | ORAL | Status: DC | PRN

## 2014-11-11 MED ORDER — ACETAMINOPHEN 650 MG RE SUPP: 650.0000 mg | Freq: Four times a day (QID) | RECTAL | Status: DC | PRN

## 2014-11-11 MED ORDER — TIOTROPIUM BROMIDE MONOHYDRATE 18 MCG IN CAPS
18.0000 ug | ORAL_CAPSULE | Freq: Every day | RESPIRATORY_TRACT | Status: DC
  Administered 2014-11-12 – 2014-11-15 (×4): 18 ug via RESPIRATORY_TRACT
  Filled 2014-11-11: qty 5

## 2014-11-11 MED ORDER — BUDESONIDE-FORMOTEROL FUMARATE 160-4.5 MCG/ACT IN AERO
2.0000 | INHALATION_SPRAY | Freq: Two times a day (BID) | RESPIRATORY_TRACT | Status: DC
  Administered 2014-11-12 – 2014-11-15 (×7): 2 via RESPIRATORY_TRACT
  Filled 2014-11-11 (×2): qty 6

## 2014-11-11 MED ORDER — INFLUENZA VAC SPLIT QUAD 0.5 ML IM SUSY
0.5000 mL | PREFILLED_SYRINGE | INTRAMUSCULAR | Status: AC
  Administered 2014-11-12: 0.5 mL via INTRAMUSCULAR
  Filled 2014-11-11: qty 0.5

## 2014-11-11 MED ORDER — ACETAMINOPHEN 325 MG PO TABS: 650.0000 mg | ORAL_TABLET | Freq: Four times a day (QID) | ORAL | Status: DC | PRN

## 2014-11-11 MED ORDER — HEPARIN SODIUM (PORCINE) 5000 UNIT/ML IJ SOLN
5000.0000 [IU] | Freq: Three times a day (TID) | INTRAMUSCULAR | Status: DC
  Administered 2014-11-11 – 2014-11-15 (×12): 5000 [IU] via SUBCUTANEOUS
  Filled 2014-11-11 (×12): qty 1

## 2014-11-11 MED ORDER — POTASSIUM CHLORIDE 10 MEQ/100ML IV SOLN
10.0000 meq | INTRAVENOUS | Status: AC
  Administered 2014-11-11: 10 meq via INTRAVENOUS
  Filled 2014-11-11 (×3): qty 100

## 2014-11-11 MED ORDER — HYDROCODONE-ACETAMINOPHEN 5-325 MG PO TABS
1.0000 | ORAL_TABLET | ORAL | Status: DC | PRN
  Administered 2014-11-11: 1 via ORAL
  Filled 2014-11-11: qty 1

## 2014-11-11 MED ORDER — ONDANSETRON HCL 4 MG PO TABS: 4.0000 mg | ORAL_TABLET | Freq: Four times a day (QID) | ORAL | Status: DC | PRN

## 2014-11-11 MED ORDER — POTASSIUM CHLORIDE CRYS ER 20 MEQ PO TBCR
30.0000 meq | EXTENDED_RELEASE_TABLET | Freq: Two times a day (BID) | ORAL | Status: AC
  Administered 2014-11-11 – 2014-11-13 (×4): 30 meq via ORAL
  Filled 2014-11-11 (×8): qty 1

## 2014-11-11 MED ORDER — ALPRAZOLAM 1 MG PO TABS
1.0000 mg | ORAL_TABLET | Freq: Three times a day (TID) | ORAL | Status: DC | PRN
  Administered 2014-11-11 – 2014-11-14 (×4): 1 mg via ORAL
  Filled 2014-11-11 (×4): qty 1

## 2014-11-11 MED ORDER — ALBUTEROL SULFATE (2.5 MG/3ML) 0.083% IN NEBU: 2.5000 mg | INHALATION_SOLUTION | Freq: Three times a day (TID) | RESPIRATORY_TRACT | Status: DC

## 2014-11-11 MED ORDER — ALBUTEROL SULFATE (2.5 MG/3ML) 0.083% IN NEBU
2.5000 mg | INHALATION_SOLUTION | Freq: Three times a day (TID) | RESPIRATORY_TRACT | Status: DC
  Administered 2014-11-11 – 2014-11-15 (×13): 2.5 mg via RESPIRATORY_TRACT
  Filled 2014-11-11 (×14): qty 3

## 2014-11-11 MED ORDER — SODIUM CHLORIDE 0.9 % IV SOLN: INTRAVENOUS | Status: DC

## 2014-11-11 MED ORDER — POTASSIUM CHLORIDE IN NACL 20-0.9 MEQ/L-% IV SOLN
INTRAVENOUS | Status: DC
  Administered 2014-11-11 – 2014-11-14 (×2): via INTRAVENOUS

## 2014-11-11 MED ORDER — ASPIRIN EC 81 MG PO TBEC
81.0000 mg | DELAYED_RELEASE_TABLET | Freq: Every day | ORAL | Status: DC
Start: 2014-11-12 — End: 2014-11-16
  Administered 2014-11-12 – 2014-11-15 (×4): 81 mg via ORAL
  Filled 2014-11-11 (×4): qty 1

## 2014-11-11 NOTE — ED Notes (Signed)
EMS called out for weakness, decreased LOC, patient being treated for UTI currently, family reports spinal tap to check for meningitis was negative on Saturday;

## 2014-11-11 NOTE — ED Notes (Signed)
RT paged.

## 2014-11-11 NOTE — ED Notes (Signed)
Starting last bag of potassium chloride ( third dose). Patient tolerating at 100 ml/hr at this time.

## 2014-11-11 NOTE — H&P (Signed)
[facility] History and Physical  Melanie Lowe ZOX:[id number] DOB: Nov 22, 1924 DOA: 11/11/2014  Referring physician: ED physician, Dr. Manus Gunning PCP: Fredirick Maudlin, MD   Chief Complaint: generalized weakness and confusion.  HPI: Melanie Lowe is a 80 y.o. female with a history of chronic abdominal aortic aneurysm, oxygen dependent COPD, coronary artery disease, and chronic anxiety. She presents to the emergency department with confusion and generalized weakness per her family. The history is provided by the patient and her son, [name]. She has had intermittent confusion for the past couple weeks. She was seen and evaluated at [facility] last week for similar or the same symptoms consisting of confusion and generalized weakness. Apparently, she was found to have a urinary tract infection and started on Levaquin. She has taken all but a couple pills of the Levaquin. Also, during the evaluation at [facility], she apparently underwent a lumbar puncture to rule out meningitis. It was negative her report. She also has had some neck and throat discomfort or "fullness" intermittently over the past couple weeks. She denies productive cough or thrush on her tongue. She does have some difficulty with chewing and swallowing at times, but not consistently. She denies headache, focal weakness, visual changes, chest pain, shortness of breath, abdominal pain, nausea, or vomiting. She did have loose stools a couple of days ago.  In the ED, she was hemodynamically stable and borderline bradycardic. She was oxygenating 99% on 3 L of oxygen. Her lab data were significant for a serum sodium of 135, potassium of 3.0, normal troponin I, an unremarkable urinalysis. ABG on 3 L revealed no significant hypercapnia or hypoxemia. CT angiogram of her chest and abdomen revealed no evidence of aortic dissection, ascending aortic aneurysm of 4.5 cm, large infrarenal abdominal aneurysm of 7.0 cm with significant mural thrombus,  and a left adrenal nodule favored to be benign. She is being admitted for further evaluation and management.     Review of Systems:  As above in history present illness. Otherwise negative.  Past Medical History  Diagnosis Date  . COPD (chronic obstructive pulmonary disease)   . Hypertension   . Anxiety   . Depression   . Arthritis   . AAA (abdominal aortic aneurysm)   . Neuropathy   . Leg swelling   . Oxygen dependent   . Myocardial infarction   . Pneumonia   . Stroke    Past Surgical History  Procedure Laterality Date  . Cholecystectomy    . Appendectomy     Social History: she is widowed. She lives with her son [name]. She stopped smoking. She denies active tobacco and alcohol use currently. She ambulates with a walker.   Allergies  Allergen Reactions  . Nsaids     Per patients son due to AAA.  Can only take coated asprin  . Other     FOOD: Spice Triggers acid reflux  . Tomato     Triggers acid reflux    Family History  Problem Relation Age of Onset  . Diabetes Brother   . Cancer Brother     Throat cancer, Heart attack  . Heart disease Brother   . Heart attack Brother   . Cancer Father   . Cancer Sister      Prior to Admission medications   Medication Sig Start Date End Date Taking? Authorizing Provider  albuterol (PROVENTIL) (2.5 MG/3ML) 0.083% nebulizer solution Take 2.5 mg by nebulization 3 (three) times daily.    Yes Historical Provider, MD  aspirin  EC 81 MG tablet Take 81 mg by mouth daily.   Yes Historical Provider, MD  budesonide-formoterol (SYMBICORT) 160-4.5 MCG/ACT inhaler Inhale 2 puffs into the lungs 2 (two) times daily.    Yes Historical Provider, MD  citalopram (CELEXA) 20 MG tablet Take 20 mg by mouth at bedtime.    Yes Historical Provider, MD  diazepam (VALIUM) 10 MG tablet Take 10 mg by mouth 3 (three) times daily as needed for anxiety.   Yes Historical Provider, MD  gabapentin (NEURONTIN) 100 MG tablet Take 100 mg by mouth 5 (five) times  daily.    Yes Historical Provider, MD  HYDROcodone-acetaminophen (NORCO) 7.5-325 MG per tablet Take 1 tablet by mouth every 6 (six) hours as needed for moderate pain. Patient taking differently: Take 1 tablet by mouth at bedtime.  09/05/14  Yes Gerhard Munch, MD  levofloxacin (LEVAQUIN) 500 MG tablet Take 1 tablet by mouth daily. Starting 11/08/2014 x 5 days. 11/08/14  Yes Historical Provider, MD  tiotropium (SPIRIVA) 18 MCG inhalation capsule Place 18 mcg into inhaler and inhale every evening.    Yes Historical Provider, MD  torsemide (DEMADEX) 20 MG tablet Take 20 mg by mouth every morning.    Yes Historical Provider, MD  ondansetron (ZOFRAN ODT) 4 MG disintegrating tablet 4mg  ODT q4 hours prn nausea/vomit Patient not taking: Reported on 11/11/2014 09/15/14   Benny Lennert, MD   Physical Exam: Filed Vitals:   11/11/14 1530 11/11/14 1546 11/11/14 1630 11/11/14 1700  BP: 112/70 112/70 110/83 103/71  Pulse: 52 54 58 53  Temp:    97.1 F (36.2 C)  TempSrc:    Oral  Resp: 15 16  18   Height:    5\' 1"  (1.549 m)  Weight:    67.132 kg (148 lb)  SpO2: 99% 99% 96% 94%    Wt Readings from Last 3 Encounters:  11/11/14 67.132 kg (148 lb)  09/15/14 66.225 kg (146 lb)  09/05/14 68.04 kg (150 lb)    General:  Appears calm and comfortable;; elderly 80 year old sitting up in bed, in no acute distress. Eyes: PERRL, normal lids, irises & conjunctiva ENT: grossly normal hearing, lips & tongue Neck: no LAD, masses or thyromegaly Cardiovascular: S1, S2, with a soft systolic murmur. No LE edema. Telemetry: SR, no arrhythmias  Respiratory: occasional wheezes auscultated bilaterally. Breathing unlabored. Abdomen: positive bowel sounds, soft, nontender, nondistended. No audible bruit. Skin: no rash or induration seen on limited exam Musculoskeletal: grossly normal tone BUE/BLE; no acute hot red joints. Psychiatric: grossly normal mood and affect, speech fluent and appropriate Neurologic: grossly  non-focal. She is alert and oriented 2. Cranial nerves II through XII are grossly intact.          Labs on Admission:  Basic Metabolic Panel:  Recent Labs Lab 11/11/14 1217 11/11/14 1236  NA 135* 133*  K 3.0* 2.9*  CL 89* 90*  CO2 32  --   GLUCOSE 114* 114*  BUN 17 15  CREATININE 0.99 1.00  CALCIUM 9.9  --    Liver Function Tests: No results for input(s): AST, ALT, ALKPHOS, BILITOT, PROT, ALBUMIN in the last 168 hours.  Recent Labs Lab 11/11/14 1217  LIPASE 15   No results for input(s): AMMONIA in the last 168 hours. CBC:  Recent Labs Lab 11/11/14 1217 11/11/14 1236  WBC 7.8  --   HGB 12.0 11.9*  HCT 36.1 35.0*  MCV 92.3  --   PLT 380  --    Cardiac Enzymes:  Recent Labs  Lab 11/11/14 1217  TROPONINI <0.30    BNP (last 3 results)  Recent Labs  01/24/14 1555 09/05/14 1736 10/26/14 1410  PROBNP 112.5 215.3 500.0*   CBG: No results for input(s): GLUCAP in the last 168 hours.  Radiological Exams on Admission: Ct Head Wo Contrast  11/11/2014   CLINICAL DATA:  Altered mental status. Decreased level of consciousness.  EXAM: CT HEAD WITHOUT CONTRAST  CT CERVICAL SPINE WITHOUT CONTRAST  TECHNIQUE: Multidetector CT imaging of the head and cervical spine was performed following the standard protocol without intravenous contrast. Multiplanar CT image reconstructions of the cervical spine were also generated.  COMPARISON:  11/06/2014  FINDINGS: CT HEAD FINDINGS  Skull and Sinuses:Chronic small right mastoid effusion or mucosal thickening. Mild ethmoid mucosal thickening.  No acute fracture destructive process.  Orbits: No acute abnormality.  Brain: No evidence of acute abnormality, such as acute infarction, hemorrhage, hydrocephalus, or mass lesion/mass effect. Stable pattern of chronic small-vessel ischemic injury with discrete small-vessel insult in the right anterior putamen and anterior limb internal capsule. Cerebral volume loss which is normal for age.  CT  CERVICAL SPINE FINDINGS  There is reversal of cervical lordosis and slight C4-5 anterolisthesis which is chronic based on previous imaging. No acute fracture. No gross cervical canal hematoma or prevertebral edema. Focally advanced degenerative disc disease at C5-6 and C6-7 with bilateral uncovertebral spurring causing foraminal stenosis. Hemangioma present in the T2 right pedicle.  Biapical centrilobular emphysema. Mucus noted in the trachea. Tortuous great vessels at the thoracic inlet  IMPRESSION: 1. No acute intracranial or cervical spine findings. 2. Chronic and degenerative changes are noted above.   Electronically Signed   By: Tiburcio Pea M.D.   On: 11/11/2014 13:58   Ct Cervical Spine Wo Contrast  11/11/2014   CLINICAL DATA:  Altered mental status with concern for fall.  Pain  EXAM: CT HEAD WITHOUT CONTRAST  CT CERVICAL SPINE WITHOUT CONTRAST  TECHNIQUE: Multidetector CT imaging of the head and cervical spine was performed following the standard protocol without intravenous contrast. Multiplanar CT image reconstructions of the cervical spine were also generated.  COMPARISON:  CT head November 06, 2014; CT cervical spine September 15, 2014  FINDINGS: CT HEAD FINDINGS  There is mild diffuse atrophy. There is no intracranial mass, hemorrhage, extra-axial fluid collection, or midline shift. There is a prior focal infarct involving the right putamen. There is patchy small vessel disease in the centra semiovale bilaterally, stable. There is no new gray-white compartment lesion. No demonstrable acute infarct. The bony calvarium appears intact. The mastoid air cells are clear.  CT CERVICAL SPINE FINDINGS  There is no fracture or spondylolisthesis. Prevertebral soft tissues and predental space regions are normal. There is moderately severe disc space narrowing at C5-6 and C6-7, stable. There is no new disc space narrowing. There is facet hypertrophy at multiple levels bilaterally. There is diffuse disc  protrusion and bony hypertrophy at C5-6 and C6-7 bilaterally. There is rather marked exit foraminal narrowing at C5-6 and C6-7 bilaterally with impression on the exiting nerve roots at these levels bilaterally. No frank disc extrusion or stenosis. There are scattered foci of carotid artery calcification bilaterally. There is chronic reversal of lordotic curvature.  IMPRESSION: CT head: Atrophy with patchy periventricular small vessel disease. Prior infarct right putamen. No acute infarct apparent. No intracranial mass, hemorrhage, or acute appearing infarct.  CT cervical spine: Multilevel osteoarthritic change. Note that there is marked exit foraminal narrowing with nerve root effacement at C5-6 and C6-7  bilaterally due to bony hypertrophy and diffuse disc bulging. No frank disc extrusion or stenosis. No fracture or spondylolisthesis. Probable chronic muscle spasm given the chronic reversal of lordotic curvature. Bilateral carotid artery calcification.   Electronically Signed   By: Bretta Bang M.D.   On: 11/11/2014 13:49   Dg Chest Portable 1 View  11/11/2014   CLINICAL DATA:  Five day history of altered mental status. COPD. Hypertension  EXAM: PORTABLE CHEST - 1 VIEW  COMPARISON:  November 06, 2014 and January 24, 2014  FINDINGS: There is patchy atelectatic change in both mid and lower lung zones, stable. There is no frank edema or consolidation. Heart is mildly enlarged with pulmonary vascularity within normal limits. There is atherosclerotic change in aorta. Aorta is somewhat tortuous. No adenopathy. Bones are osteoporotic.  IMPRESSION: Patchy atelectasis bilaterally, stable. No edema or consolidation. Stable cardiac prominence. Aortic prominence and tortuosity probably reflect chronic hypertensive change.   Electronically Signed   By: Bretta Bang M.D.   On: 11/11/2014 12:55   Ct Angio Chest Aorta W/cm &/or Wo/cm  11/11/2014   ADDENDUM REPORT: 11/11/2014 15:53  ADDENDUM: Comparison prior  studies chest CT 06/20/2013 and abdominal CT on 03/25/2012: The thoracic aortic aneurysm is stable. The abdominal aortic aneurysm has increased in size (previously 5.7 x 5.9 cm. )  Critical Value/emergent results were called by telephone at the time of interpretation on 11/11/2014 at 3:50 pm to Dr. [name] , who verbally acknowledged these results.   Electronically Signed   By: Rosalie Gums M.D.   On: 11/11/2014 15:53   11/11/2014   CLINICAL DATA:  EMS called out for weakness, decreased LOC, patient being treated for UTI currently. Pt has hx of myocardial infarction, stroke, HTN, AAA. Pt complains of abdominal pain and back pain but did not specify site.  EXAM: CT ANGIOGRAPHY CHEST, ABDOMEN AND PELVIS  TECHNIQUE: Multidetector CT imaging through the chest, abdomen and pelvis was performed using the standard protocol during bolus administration of intravenous contrast. Multiplanar reconstructed images and MIPs were obtained and reviewed to evaluate the vascular anatomy.  CONTRAST:  OMNIPAQUE IOHEXOL 350 MG/ML SOLN  COMPARISON:  CT chest 06/20/2013  FINDINGS: CTA CHEST FINDINGS  Heart: Heart size is normal. There is significant coronary artery calcification no pericardial effusion.  Vascular structures: Normal arch anatomy. There is significant atherosclerotic calcification of the thoracic aorta. Ascending aortic aneurysm is 4.5 cm. The pulmonary arteries appear grossly well opacified.  Mediastinum/thyroid: Small, nonspecific mediastinal are present.  Lungs/Airways: There are emphysematous changes throughout the lungs. Areas of scarring/ atelectasis are identified in the lung bases bilaterally. No focal consolidations or pleural effusions are identified. No suspicious nodules. Probable mucous is identified at the level of the thoracic inlet within the trachea. Otherwise the airways appear patent.  Chest wall/osseous structures: No suspicious lytic or blastic lesions are identified. No acute thoracic  fracture.  Review of the MIP images confirms the above findings.  CTA ABDOMEN AND PELVIS FINDINGS  Vasculature: There is an infrarenal abdominal aortic aneurysm measuring 6.8 x 7.0 cm. Significant mural thrombus is present. There is significant atherosclerotic calcification of the origin of the superior mesenteric artery approximately 50% narrowing of origin. There is dense atherosclerotic calcification of the celiac axis without significant narrowing of the origin. Bilateral single renal arteries with significant atherosclerotic calcification at the origins. Normally opacified inferior mesenteric artery with calcification at the origin. The iliac arteries are tortuous. Right iliac artery is mildly aneurysmal, measuring 1.5 cm. There is  no evidence for abdominal aortic dissection.  Upper abdomen: There is focal fatty infiltration adjacent to the falciform ligament of the liver. No focal abnormality within the spleen, pancreas, or right adrenal gland. A low-attenuation lesion within the left adrenal gland measures 1.8 x 1.7 cm. Based on density measurements this is likely a benign process. Kidney show normal bilateral excretion without focal mass.  Gastrointestinal tract: The stomach and small bowel loops are normal in appearance. Numerous colonic diverticula. No acute diverticulitis. No evidence for acute appendicitis.  Pelvis: The uterus is present. No adnexal mass or free pelvic fluid.  Retroperitoneum: No retroperitoneal or mesenteric adenopathy.  Abdominal wall: Unremarkable.  Osseous structures: There is significant degenerative change throughout the in lumbar spine. There is 6 mm of anterolisthesis of L3 on L4. Significant disc height loss and disc protrusion at L4-5. There is 3 mm of retrolisthesis of L1 on L2, associated with disc protrusion. No acute lumbar fracture.  Review of the MIP images confirms the above findings.  IMPRESSION: 1. No evidence for aortic dissection. 2. Ascending aortic aneurysm is 4.5  cm. 3. Large infrarenal abdominal aortic aneurysm is 7.0 cm with significant mural thrombus. 4. Significant atherosclerotic calcification of the abdominal vessels. 5. Left adrenal nodule 1.8 cm is favored to be benign based on density measurements.  Electronically Signed: By: Rosalie Gums M.D. On: 11/11/2014 15:47   Ct Cta Abd/pel W/cm &/or W/o Cm  11/11/2014   ADDENDUM REPORT: 11/11/2014 15:53  ADDENDUM: Comparison prior studies chest CT 06/20/2013 and abdominal CT on 03/25/2012: The thoracic aortic aneurysm is stable. The abdominal aortic aneurysm has increased in size (previously 5.7 x 5.9 cm. )  Critical Value/emergent results were called by telephone at the time of interpretation on 11/11/2014 at 3:50 pm to Dr. [name] , who verbally acknowledged these results.   Electronically Signed   By: Rosalie Gums M.D.   On: 11/11/2014 15:53   11/11/2014   CLINICAL DATA:  EMS called out for weakness, decreased LOC, patient being treated for UTI currently. Pt has hx of myocardial infarction, stroke, HTN, AAA. Pt complains of abdominal pain and back pain but did not specify site.  EXAM: CT ANGIOGRAPHY CHEST, ABDOMEN AND PELVIS  TECHNIQUE: Multidetector CT imaging through the chest, abdomen and pelvis was performed using the standard protocol during bolus administration of intravenous contrast. Multiplanar reconstructed images and MIPs were obtained and reviewed to evaluate the vascular anatomy.  CONTRAST:  OMNIPAQUE IOHEXOL 350 MG/ML SOLN  COMPARISON:  CT chest 06/20/2013  FINDINGS: CTA CHEST FINDINGS  Heart: Heart size is normal. There is significant coronary artery calcification no pericardial effusion.  Vascular structures: Normal arch anatomy. There is significant atherosclerotic calcification of the thoracic aorta. Ascending aortic aneurysm is 4.5 cm. The pulmonary arteries appear grossly well opacified.  Mediastinum/thyroid: Small, nonspecific mediastinal are present.  Lungs/Airways: There are  emphysematous changes throughout the lungs. Areas of scarring/ atelectasis are identified in the lung bases bilaterally. No focal consolidations or pleural effusions are identified. No suspicious nodules. Probable mucous is identified at the level of the thoracic inlet within the trachea. Otherwise the airways appear patent.  Chest wall/osseous structures: No suspicious lytic or blastic lesions are identified. No acute thoracic fracture.  Review of the MIP images confirms the above findings.  CTA ABDOMEN AND PELVIS FINDINGS  Vasculature: There is an infrarenal abdominal aortic aneurysm measuring 6.8 x 7.0 cm. Significant mural thrombus is present. There is significant atherosclerotic calcification of the origin of the superior  mesenteric artery approximately 50% narrowing of origin. There is dense atherosclerotic calcification of the celiac axis without significant narrowing of the origin. Bilateral single renal arteries with significant atherosclerotic calcification at the origins. Normally opacified inferior mesenteric artery with calcification at the origin. The iliac arteries are tortuous. Right iliac artery is mildly aneurysmal, measuring 1.5 cm. There is no evidence for abdominal aortic dissection.  Upper abdomen: There is focal fatty infiltration adjacent to the falciform ligament of the liver. No focal abnormality within the spleen, pancreas, or right adrenal gland. A low-attenuation lesion within the left adrenal gland measures 1.8 x 1.7 cm. Based on density measurements this is likely a benign process. Kidney show normal bilateral excretion without focal mass.  Gastrointestinal tract: The stomach and small bowel loops are normal in appearance. Numerous colonic diverticula. No acute diverticulitis. No evidence for acute appendicitis.  Pelvis: The uterus is present. No adnexal mass or free pelvic fluid.  Retroperitoneum: No retroperitoneal or mesenteric adenopathy.  Abdominal wall: Unremarkable.  Osseous  structures: There is significant degenerative change throughout the in lumbar spine. There is 6 mm of anterolisthesis of L3 on L4. Significant disc height loss and disc protrusion at L4-5. There is 3 mm of retrolisthesis of L1 on L2, associated with disc protrusion. No acute lumbar fracture.  Review of the MIP images confirms the above findings.  IMPRESSION: 1. No evidence for aortic dissection. 2. Ascending aortic aneurysm is 4.5 cm. 3. Large infrarenal abdominal aortic aneurysm is 7.0 cm with significant mural thrombus. 4. Significant atherosclerotic calcification of the abdominal vessels. 5. Left adrenal nodule 1.8 cm is favored to be benign based on density measurements.  Electronically Signed: By: Rosalie Gums M.D. On: 11/11/2014 15:47    EKG: Independently reviewed.   Assessment/Plan Principal Problem:   Acute encephalopathy Active Problems:   Generalized weakness   Hypokalemia   Abdominal aortic aneurysm   Essential hypertension   Chronic respiratory failure with hypoxia   Bradycardia   1. This is a 80 year old woman who presents with confusional state and generalized weakness. Her symptoms are intermittent and has been present for 2 weeks. She was recently treated for urinary tract infection. Her repeat urinalysis is unremarkable. She may have underlying dementia, but reversible causes of dementia will be assessed with a TSH and vitamin B12 level. There was no evidence of hypercapnia or hypoxemia on the ABG. Per my exam, she is alert and oriented and has no obvious confusion at this time. Her serum potassium is low and will continue to be supplemented and repleted. Will order a magnesium level to rule out deficiency. The most concerning finding is a large infrarenal abdominal aortic aneurysm measuring 7.0 cm with a mural thrombus. Apparently, the aneurysm was 6 cm a couple of years ago. She had been followed by vascular surgery at [facility], but she was deemed not to be a surgical candidate. ED  physician discussed the patient with vascular surgeon on call, Dr. Imogene Burn who agreed she was not a surgical candidate. I do not believe she is a candidate for anticoagulation given her severe comorbid conditions and fall risk.   Plan: 1. The patient is being admitted for evaluation of generalized weakness and encephalopathy. 2. Will supplement/replete potassium chloride gently and IV fluids and orally. Will order a magnesium level to rule out deficiency. 3. Will restart most of her chronic medications. 4. Will order TSH and vitamin B12 level to rule out deficiencies. 5. Will consult physical therapy for evaluation. 6. I discussed the  patient's poor prognosis with her son Bethann BerkshireJohnny. Given that she has 2 different aneurysms, with one that is likely to rupture, I suggested hospice (but I did not push the issue and preferred that this be discussed further with her primary care physician Dr. Juanetta GoslingHawkins). [name] became tearful, but understood. She was made a DO NOT RESUSCITATE.     Code Status: DO NOT RESUSCITATE DVT Prophylaxis:subcutaneous heparin Family Communication: discussed with her son Bethann BerkshireJohnny Disposition Plan: discharge when clinically appropriate  Time spent: one hour and 10 minutes.  Ed Fraser Memorial HospitalFISHER,Mert Dietrick [facility] Pager [phone]

## 2014-11-11 NOTE — ED Notes (Signed)
Attempted to call report, nurse to call back. 

## 2014-11-11 NOTE — Progress Notes (Signed)
Asked to comment on this patient's CTA.  On review of the CTA, she has an enlarged suprarenal AAA with ascending aortic aneurysm.  This AAA is previously known from prior clinic visits with Dr. Edilia Bo.  Due to her COPD, Dr. Edilia Bo felt her risk stratification did not favor intervention previously.  Unruptured AAA do not cause altered mental status as described.  Again, she is not an open operative candidate.  I doubt she is a fenestrated endograft candidate given her age and multiple co-morbidities.  If the family, elect to consider repair she would best be evaluated at [facility] due to their robust fenestrated [facility] practice.    Radiology: Ct Head Wo Contrast  11/11/2014   CLINICAL DATA:  Altered mental status. Decreased level of consciousness.  EXAM: CT HEAD WITHOUT CONTRAST  CT CERVICAL SPINE WITHOUT CONTRAST  TECHNIQUE: Multidetector CT imaging of the head and cervical spine was performed following the standard protocol without intravenous contrast. Multiplanar CT image reconstructions of the cervical spine were also generated.  COMPARISON:  11/06/2014  FINDINGS: CT HEAD FINDINGS  Skull and Sinuses:Chronic small right mastoid effusion or mucosal thickening. Mild ethmoid mucosal thickening.  No acute fracture destructive process.  Orbits: No acute abnormality.  Brain: No evidence of acute abnormality, such as acute infarction, hemorrhage, hydrocephalus, or mass lesion/mass effect. Stable pattern of chronic small-vessel ischemic injury with discrete small-vessel insult in the right anterior putamen and anterior limb internal capsule. Cerebral volume loss which is normal for age.  CT CERVICAL SPINE FINDINGS  There is reversal of cervical lordosis and slight C4-5 anterolisthesis which is chronic based on previous imaging. No acute fracture. No gross cervical canal hematoma or prevertebral edema. Focally advanced degenerative disc disease at C5-6 and C6-7 with bilateral uncovertebral spurring causing  foraminal stenosis. Hemangioma present in the T2 right pedicle.  Biapical centrilobular emphysema. Mucus noted in the trachea. Tortuous great vessels at the thoracic inlet  IMPRESSION: 1. No acute intracranial or cervical spine findings. 2. Chronic and degenerative changes are noted above.   Electronically Signed   By: Tiburcio Pea M.D.   On: 11/11/2014 13:58   Ct Cervical Spine Wo Contrast  11/11/2014   CLINICAL DATA:  Altered mental status with concern for fall.  Pain  EXAM: CT HEAD WITHOUT CONTRAST  CT CERVICAL SPINE WITHOUT CONTRAST  TECHNIQUE: Multidetector CT imaging of the head and cervical spine was performed following the standard protocol without intravenous contrast. Multiplanar CT image reconstructions of the cervical spine were also generated.  COMPARISON:  CT head November 06, 2014; CT cervical spine September 15, 2014  FINDINGS: CT HEAD FINDINGS  There is mild diffuse atrophy. There is no intracranial mass, hemorrhage, extra-axial fluid collection, or midline shift. There is a prior focal infarct involving the right putamen. There is patchy small vessel disease in the centra semiovale bilaterally, stable. There is no new gray-white compartment lesion. No demonstrable acute infarct. The bony calvarium appears intact. The mastoid air cells are clear.  CT CERVICAL SPINE FINDINGS  There is no fracture or spondylolisthesis. Prevertebral soft tissues and predental space regions are normal. There is moderately severe disc space narrowing at C5-6 and C6-7, stable. There is no new disc space narrowing. There is facet hypertrophy at multiple levels bilaterally. There is diffuse disc protrusion and bony hypertrophy at C5-6 and C6-7 bilaterally. There is rather marked exit foraminal narrowing at C5-6 and C6-7 bilaterally with impression on the exiting nerve roots at these levels bilaterally. No frank disc extrusion  or stenosis. There are scattered foci of carotid artery calcification bilaterally. There is  chronic reversal of lordotic curvature.  IMPRESSION: CT head: Atrophy with patchy periventricular small vessel disease. Prior infarct right putamen. No acute infarct apparent. No intracranial mass, hemorrhage, or acute appearing infarct.  CT cervical spine: Multilevel osteoarthritic change. Note that there is marked exit foraminal narrowing with nerve root effacement at C5-6 and C6-7 bilaterally due to bony hypertrophy and diffuse disc bulging. No frank disc extrusion or stenosis. No fracture or spondylolisthesis. Probable chronic muscle spasm given the chronic reversal of lordotic curvature. Bilateral carotid artery calcification.   Electronically Signed   By: Bretta Bang M.D.   On: 11/11/2014 13:49   Dg Chest Portable 1 View  11/11/2014   CLINICAL DATA:  Five day history of altered mental status. COPD. Hypertension  EXAM: PORTABLE CHEST - 1 VIEW  COMPARISON:  November 06, 2014 and January 24, 2014  FINDINGS: There is patchy atelectatic change in both mid and lower lung zones, stable. There is no frank edema or consolidation. Heart is mildly enlarged with pulmonary vascularity within normal limits. There is atherosclerotic change in aorta. Aorta is somewhat tortuous. No adenopathy. Bones are osteoporotic.  IMPRESSION: Patchy atelectasis bilaterally, stable. No edema or consolidation. Stable cardiac prominence. Aortic prominence and tortuosity probably reflect chronic hypertensive change.   Electronically Signed   By: Bretta Bang M.D.   On: 11/11/2014 12:55   Ct Angio Chest Aorta W/cm &/or Wo/cm  11/11/2014   ADDENDUM REPORT: 11/11/2014 15:53  ADDENDUM: Comparison prior studies chest CT 06/20/2013 and abdominal CT on 03/25/2012: The thoracic aortic aneurysm is stable. The abdominal aortic aneurysm has increased in size (previously 5.7 x 5.9 cm. )  Critical Value/emergent results were called by telephone at the time of interpretation on 11/11/2014 at 3:50 pm to Dr. [name] , who verbally  acknowledged these results.   Electronically Signed   By: Rosalie Gums M.D.   On: 11/11/2014 15:53   11/11/2014   CLINICAL DATA:  EMS called out for weakness, decreased LOC, patient being treated for UTI currently. Pt has hx of myocardial infarction, stroke, HTN, AAA. Pt complains of abdominal pain and back pain but did not specify site.  EXAM: CT ANGIOGRAPHY CHEST, ABDOMEN AND PELVIS  TECHNIQUE: Multidetector CT imaging through the chest, abdomen and pelvis was performed using the standard protocol during bolus administration of intravenous contrast. Multiplanar reconstructed images and MIPs were obtained and reviewed to evaluate the vascular anatomy.  CONTRAST:  OMNIPAQUE IOHEXOL 350 MG/ML SOLN  COMPARISON:  CT chest 06/20/2013  FINDINGS: CTA CHEST FINDINGS  Heart: Heart size is normal. There is significant coronary artery calcification no pericardial effusion.  Vascular structures: Normal arch anatomy. There is significant atherosclerotic calcification of the thoracic aorta. Ascending aortic aneurysm is 4.5 cm. The pulmonary arteries appear grossly well opacified.  Mediastinum/thyroid: Small, nonspecific mediastinal are present.  Lungs/Airways: There are emphysematous changes throughout the lungs. Areas of scarring/ atelectasis are identified in the lung bases bilaterally. No focal consolidations or pleural effusions are identified. No suspicious nodules. Probable mucous is identified at the level of the thoracic inlet within the trachea. Otherwise the airways appear patent.  Chest wall/osseous structures: No suspicious lytic or blastic lesions are identified. No acute thoracic fracture.  Review of the MIP images confirms the above findings.  CTA ABDOMEN AND PELVIS FINDINGS  Vasculature: There is an infrarenal abdominal aortic aneurysm measuring 6.8 x 7.0 cm. Significant mural thrombus is present. There is  significant atherosclerotic calcification of the origin of the superior mesenteric artery  approximately 50% narrowing of origin. There is dense atherosclerotic calcification of the celiac axis without significant narrowing of the origin. Bilateral single renal arteries with significant atherosclerotic calcification at the origins. Normally opacified inferior mesenteric artery with calcification at the origin. The iliac arteries are tortuous. Right iliac artery is mildly aneurysmal, measuring 1.5 cm. There is no evidence for abdominal aortic dissection.  Upper abdomen: There is focal fatty infiltration adjacent to the falciform ligament of the liver. No focal abnormality within the spleen, pancreas, or right adrenal gland. A low-attenuation lesion within the left adrenal gland measures 1.8 x 1.7 cm. Based on density measurements this is likely a benign process. Kidney show normal bilateral excretion without focal mass.  Gastrointestinal tract: The stomach and small bowel loops are normal in appearance. Numerous colonic diverticula. No acute diverticulitis. No evidence for acute appendicitis.  Pelvis: The uterus is present. No adnexal mass or free pelvic fluid.  Retroperitoneum: No retroperitoneal or mesenteric adenopathy.  Abdominal wall: Unremarkable.  Osseous structures: There is significant degenerative change throughout the in lumbar spine. There is 6 mm of anterolisthesis of L3 on L4. Significant disc height loss and disc protrusion at L4-5. There is 3 mm of retrolisthesis of L1 on L2, associated with disc protrusion. No acute lumbar fracture.  Review of the MIP images confirms the above findings.  IMPRESSION: 1. No evidence for aortic dissection. 2. Ascending aortic aneurysm is 4.5 cm. 3. Large infrarenal abdominal aortic aneurysm is 7.0 cm with significant mural thrombus. 4. Significant atherosclerotic calcification of the abdominal vessels. 5. Left adrenal nodule 1.8 cm is favored to be benign based on density measurements.  Electronically Signed: By: Rosalie GumsBeth  Brown M.D. On: 11/11/2014 15:47   Ct  Cta Abd/pel W/cm &/or W/o Cm  11/11/2014   ADDENDUM REPORT: 11/11/2014 15:53  ADDENDUM: Comparison prior studies chest CT 06/20/2013 and abdominal CT on 03/25/2012: The thoracic aortic aneurysm is stable. The abdominal aortic aneurysm has increased in size (previously 5.7 x 5.9 cm. )  Critical Value/emergent results were called by telephone at the time of interpretation on 11/11/2014 at 3:50 pm to Dr. Glynn OctaveSTEPHEN RANCOUR , who verbally acknowledged these results.   Electronically Signed   By: Rosalie GumsBeth  Brown M.D.   On: 11/11/2014 15:53   11/11/2014   CLINICAL DATA:  EMS called out for weakness, decreased LOC, patient being treated for UTI currently. Pt has hx of myocardial infarction, stroke, HTN, AAA. Pt complains of abdominal pain and back pain but did not specify site.  EXAM: CT ANGIOGRAPHY CHEST, ABDOMEN AND PELVIS  TECHNIQUE: Multidetector CT imaging through the chest, abdomen and pelvis was performed using the standard protocol during bolus administration of intravenous contrast. Multiplanar reconstructed images and MIPs were obtained and reviewed to evaluate the vascular anatomy.  CONTRAST:  100mL OMNIPAQUE IOHEXOL 350 MG/ML SOLN  COMPARISON:  CT chest 06/20/2013  FINDINGS: CTA CHEST FINDINGS  Heart: Heart size is normal. There is significant coronary artery calcification no pericardial effusion.  Vascular structures: Normal arch anatomy. There is significant atherosclerotic calcification of the thoracic aorta. Ascending aortic aneurysm is 4.5 cm. The pulmonary arteries appear grossly well opacified.  Mediastinum/thyroid: Small, nonspecific mediastinal are present.  Lungs/Airways: There are emphysematous changes throughout the lungs. Areas of scarring/ atelectasis are identified in the lung bases bilaterally. No focal consolidations or pleural effusions are identified. No suspicious nodules. Probable mucous is identified at the level of the thoracic inlet within the  trachea. Otherwise the airways appear patent.   Chest wall/osseous structures: No suspicious lytic or blastic lesions are identified. No acute thoracic fracture.  Review of the MIP images confirms the above findings.  CTA ABDOMEN AND PELVIS FINDINGS  Vasculature: There is an infrarenal abdominal aortic aneurysm measuring 6.8 x 7.0 cm. Significant mural thrombus is present. There is significant atherosclerotic calcification of the origin of the superior mesenteric artery approximately 50% narrowing of origin. There is dense atherosclerotic calcification of the celiac axis without significant narrowing of the origin. Bilateral single renal arteries with significant atherosclerotic calcification at the origins. Normally opacified inferior mesenteric artery with calcification at the origin. The iliac arteries are tortuous. Right iliac artery is mildly aneurysmal, measuring 1.5 cm. There is no evidence for abdominal aortic dissection.  Upper abdomen: There is focal fatty infiltration adjacent to the falciform ligament of the liver. No focal abnormality within the spleen, pancreas, or right adrenal gland. A low-attenuation lesion within the left adrenal gland measures 1.8 x 1.7 cm. Based on density measurements this is likely a benign process. Kidney show normal bilateral excretion without focal mass.  Gastrointestinal tract: The stomach and small bowel loops are normal in appearance. Numerous colonic diverticula. No acute diverticulitis. No evidence for acute appendicitis.  Pelvis: The uterus is present. No adnexal mass or free pelvic fluid.  Retroperitoneum: No retroperitoneal or mesenteric adenopathy.  Abdominal wall: Unremarkable.  Osseous structures: There is significant degenerative change throughout the in lumbar spine. There is 6 mm of anterolisthesis of L3 on L4. Significant disc height loss and disc protrusion at L4-5. There is 3 mm of retrolisthesis of L1 on L2, associated with disc protrusion. No acute lumbar fracture.  Review of the MIP images confirms the  above findings.  IMPRESSION: 1. No evidence for aortic dissection. 2. Ascending aortic aneurysm is 4.5 cm. 3. Large infrarenal abdominal aortic aneurysm is 7.0 cm with significant mural thrombus. 4. Significant atherosclerotic calcification of the abdominal vessels. 5. Left adrenal nodule 1.8 cm is favored to be benign based on density measurements.  Electronically Signed: By: Rosalie Gums M.D. On: 11/11/2014 15:47     Leonides Sake, MD [facility] Office: [phone] Pager: [phone]  11/11/2014, 4:33 PM

## 2014-11-11 NOTE — ED Provider Notes (Signed)
CSN: [id number]     Arrival date & time 11/11/14  1147 History  This chart was scribed for Melanie Octave, MD by [name], ED Scribe. This patient was seen in room [facility] and the patient's care was started at 12:16 PM.    Chief Complaint  Patient presents with  . Fatigue   The history is provided by the patient. The history is limited by the condition of the patient. No language interpreter was used.  LEVEL V CAVEAT-ALTERED MENTAL STATUS  HPI Comments: Melanie Lowe is a 80 y.o. female who presents to the Emergency Department complaining of altered mental status since 5 days ago. She also complains of neck and throat pain with onset 2 weeks ago and difficulty walking. Per daughter, she normally does not have any dementia but is currently disoriented. She notes that she needs help ambulating, but normally does not need help. She states the patient has been eating and drinking. She reports associated abdominal pain. She denies fever or headache. She was diagnosed with a UTI 5 days ago at [facility]; she was treated with Levaquin that night and given 5 pills to take home, she has 2 left. Per daughter, she had a spinal tap there that was negative for meningitis. Per daughter, she saw her PCP last week but was not treated for her neck pain. Her daughter states she has a AAA that is inoperable just inferior to her aorta. Records indicate is was 6cm in 2013. She denies use of blood thinners. She denies history of kidney problems.   Past Medical History  Diagnosis Date  . COPD (chronic obstructive pulmonary disease)   . Hypertension   . Anxiety   . Depression   . Arthritis   . AAA (abdominal aortic aneurysm)   . Neuropathy   . Leg swelling   . Oxygen dependent   . Myocardial infarction   . Pneumonia   . Stroke    Past Surgical History  Procedure Laterality Date  . Cholecystectomy    . Appendectomy     Family History  Problem Relation Age of Onset  . Diabetes Brother   . Cancer  Brother     Throat cancer, Heart attack  . Heart disease Brother   . Heart attack Brother   . Cancer Father   . Cancer Sister    History  Substance Use Topics  . Smoking status: Former Smoker -- 50 years    Types: Cigarettes    Quit date: 12/31/2010  . Smokeless tobacco: Former Neurosurgeon    Quit date: 12/31/2010  . Alcohol Use: No   OB History    No data available     Review of Systems  Unable to perform ROS: Mental status change   A complete 10 system review of systems was obtained and all systems are negative except as noted in the HPI and PMH.    Allergies  Nsaids; Other; and Tomato  Home Medications   Prior to Admission medications   Medication Sig Start Date End Date Taking? Authorizing Provider  albuterol (PROVENTIL) (2.5 MG/3ML) 0.083% nebulizer solution Take 2.5 mg by nebulization 3 (three) times daily.    Yes Historical Provider, MD  aspirin EC 81 MG tablet Take 81 mg by mouth daily.   Yes Historical Provider, MD  budesonide-formoterol (SYMBICORT) 160-4.5 MCG/ACT inhaler Inhale 2 puffs into the lungs 2 (two) times daily.    Yes Historical Provider, MD  citalopram (CELEXA) 20 MG tablet Take 20 mg by mouth at bedtime.  Yes Historical Provider, MD  diazepam (VALIUM) 10 MG tablet Take 10 mg by mouth 3 (three) times daily as needed for anxiety.   Yes Historical Provider, MD  gabapentin (NEURONTIN) 100 MG tablet Take 100 mg by mouth 5 (five) times daily.    Yes Historical Provider, MD  HYDROcodone-acetaminophen (NORCO) 7.5-325 MG per tablet Take 1 tablet by mouth every 6 (six) hours as needed for moderate pain. Patient taking differently: Take 1 tablet by mouth at bedtime.  09/05/14  Yes Gerhard Munchobert Lockwood, MD  levofloxacin (LEVAQUIN) 500 MG tablet Take 1 tablet by mouth daily. Starting 11/08/2014 x 5 days. 11/08/14  Yes Historical Provider, MD  tiotropium (SPIRIVA) 18 MCG inhalation capsule Place 18 mcg into inhaler and inhale every evening.    Yes Historical Provider, MD   torsemide (DEMADEX) 20 MG tablet Take 20 mg by mouth every morning.    Yes Historical Provider, MD  ondansetron (ZOFRAN ODT) 4 MG disintegrating tablet 4mg  ODT q4 hours prn nausea/vomit Patient not taking: Reported on 11/11/2014 09/15/14   Benny LennertJoseph L Zammit, MD   BP 112/70 mmHg  Pulse 54  Temp(Src) 98.1 F (36.7 C) (Oral)  Resp 16  SpO2 99% Physical Exam  Constitutional: She appears well-developed and well-nourished. No distress.  HENT:  Head: Normocephalic and atraumatic.  Mouth/Throat: No oropharyngeal exudate.  dry mucous membranes  Eyes: Conjunctivae and EOM are normal. Pupils are equal, round, and reactive to light.  Neck: Normal range of motion. Neck supple.  No meningismus.  Cardiovascular: Normal rate, regular rhythm, normal heart sounds and intact distal pulses.   No murmur heard. Pulmonary/Chest: Effort normal. No respiratory distress.  Diminished breath sounds  Abdominal: Soft. There is tenderness (diffuse tenderness). There is no rebound and no guarding.  Musculoskeletal: Normal range of motion. She exhibits no edema or tenderness.  Neurological: She is alert. No cranial nerve deficit. She exhibits normal muscle tone. Coordination normal.  Oriented x2. Cranial nerves II-XII intact. 4/5 strength throughout.  Skin: Skin is warm.  Psychiatric: She has a normal mood and affect. Her behavior is normal.  Nursing note and vitals reviewed.   ED Course  Procedures (including critical care time)  DIAGNOSTIC STUDIES: Oxygen Saturation is 99% on nasal canula, normal by my interpretation.    COORDINATION OF CARE: 12:27 PM Discussed treatment plan with patient at beside, the patient agrees with the plan and has no further questions at this time.   Labs Review Labs Reviewed  BASIC METABOLIC PANEL - Abnormal; Notable for the following:    Sodium 135 (*)    Potassium 3.0 (*)    Chloride 89 (*)    Glucose, Bld 114 (*)    GFR calc non Af Amer 49 (*)    GFR calc Af Amer 57  (*)    All other components within normal limits  PROTIME-INR - Abnormal; Notable for the following:    Prothrombin Time 16.7 (*)    All other components within normal limits  BLOOD GAS, ARTERIAL - Abnormal; Notable for the following:    pH, Arterial 7.493 (*)    pO2, Arterial 72.4 (*)    Bicarbonate 32.8 (*)    Acid-Base Excess 9.0 (*)    All other components within normal limits  I-STAT CHEM 8, ED - Abnormal; Notable for the following:    Sodium 133 (*)    Potassium 2.9 (*)    Chloride 90 (*)    Glucose, Bld 114 (*)    Hemoglobin 11.9 (*)  HCT 35.0 (*)    All other components within normal limits  CBC  TROPONIN I  LIPASE, BLOOD  URINALYSIS, ROUTINE W REFLEX MICROSCOPIC  I-STAT CG4 LACTIC ACID, ED    Imaging Review Ct Head Wo Contrast  11/11/2014   CLINICAL DATA:  Altered mental status. Decreased level of consciousness.  EXAM: CT HEAD WITHOUT CONTRAST  CT CERVICAL SPINE WITHOUT CONTRAST  TECHNIQUE: Multidetector CT imaging of the head and cervical spine was performed following the standard protocol without intravenous contrast. Multiplanar CT image reconstructions of the cervical spine were also generated.  COMPARISON:  11/06/2014  FINDINGS: CT HEAD FINDINGS  Skull and Sinuses:Chronic small right mastoid effusion or mucosal thickening. Mild ethmoid mucosal thickening.  No acute fracture destructive process.  Orbits: No acute abnormality.  Brain: No evidence of acute abnormality, such as acute infarction, hemorrhage, hydrocephalus, or mass lesion/mass effect. Stable pattern of chronic small-vessel ischemic injury with discrete small-vessel insult in the right anterior putamen and anterior limb internal capsule. Cerebral volume loss which is normal for age.  CT CERVICAL SPINE FINDINGS  There is reversal of cervical lordosis and slight C4-5 anterolisthesis which is chronic based on previous imaging. No acute fracture. No gross cervical canal hematoma or prevertebral edema. Focally  advanced degenerative disc disease at C5-6 and C6-7 with bilateral uncovertebral spurring causing foraminal stenosis. Hemangioma present in the T2 right pedicle.  Biapical centrilobular emphysema. Mucus noted in the trachea. Tortuous great vessels at the thoracic inlet  IMPRESSION: 1. No acute intracranial or cervical spine findings. 2. Chronic and degenerative changes are noted above.   Electronically Signed   By: Tiburcio Pea M.D.   On: 11/11/2014 13:58   Ct Cervical Spine Wo Contrast  11/11/2014   CLINICAL DATA:  Altered mental status with concern for fall.  Pain  EXAM: CT HEAD WITHOUT CONTRAST  CT CERVICAL SPINE WITHOUT CONTRAST  TECHNIQUE: Multidetector CT imaging of the head and cervical spine was performed following the standard protocol without intravenous contrast. Multiplanar CT image reconstructions of the cervical spine were also generated.  COMPARISON:  CT head November 06, 2014; CT cervical spine September 15, 2014  FINDINGS: CT HEAD FINDINGS  There is mild diffuse atrophy. There is no intracranial mass, hemorrhage, extra-axial fluid collection, or midline shift. There is a prior focal infarct involving the right putamen. There is patchy small vessel disease in the centra semiovale bilaterally, stable. There is no new gray-white compartment lesion. No demonstrable acute infarct. The bony calvarium appears intact. The mastoid air cells are clear.  CT CERVICAL SPINE FINDINGS  There is no fracture or spondylolisthesis. Prevertebral soft tissues and predental space regions are normal. There is moderately severe disc space narrowing at C5-6 and C6-7, stable. There is no new disc space narrowing. There is facet hypertrophy at multiple levels bilaterally. There is diffuse disc protrusion and bony hypertrophy at C5-6 and C6-7 bilaterally. There is rather marked exit foraminal narrowing at C5-6 and C6-7 bilaterally with impression on the exiting nerve roots at these levels bilaterally. No frank disc  extrusion or stenosis. There are scattered foci of carotid artery calcification bilaterally. There is chronic reversal of lordotic curvature.  IMPRESSION: CT head: Atrophy with patchy periventricular small vessel disease. Prior infarct right putamen. No acute infarct apparent. No intracranial mass, hemorrhage, or acute appearing infarct.  CT cervical spine: Multilevel osteoarthritic change. Note that there is marked exit foraminal narrowing with nerve root effacement at C5-6 and C6-7 bilaterally due to bony hypertrophy and diffuse disc bulging.  No frank disc extrusion or stenosis. No fracture or spondylolisthesis. Probable chronic muscle spasm given the chronic reversal of lordotic curvature. Bilateral carotid artery calcification.   Electronically Signed   By: Bretta Bang M.D.   On: 11/11/2014 13:49   Dg Chest Portable 1 View  11/11/2014   CLINICAL DATA:  Five day history of altered mental status. COPD. Hypertension  EXAM: PORTABLE CHEST - 1 VIEW  COMPARISON:  November 06, 2014 and January 24, 2014  FINDINGS: There is patchy atelectatic change in both mid and lower lung zones, stable. There is no frank edema or consolidation. Heart is mildly enlarged with pulmonary vascularity within normal limits. There is atherosclerotic change in aorta. Aorta is somewhat tortuous. No adenopathy. Bones are osteoporotic.  IMPRESSION: Patchy atelectasis bilaterally, stable. No edema or consolidation. Stable cardiac prominence. Aortic prominence and tortuosity probably reflect chronic hypertensive change.   Electronically Signed   By: Bretta Bang M.D.   On: 11/11/2014 12:55   Ct Angio Chest Aorta W/cm &/or Wo/cm  11/11/2014   ADDENDUM REPORT: 11/11/2014 15:53  ADDENDUM: Comparison prior studies chest CT 06/20/2013 and abdominal CT on 03/25/2012: The thoracic aortic aneurysm is stable. The abdominal aortic aneurysm has increased in size (previously 5.7 x 5.9 cm. )  Critical Value/emergent results were called by  telephone at the time of interpretation on 11/11/2014 at 3:50 pm to Dr. Glynn Lowe , who verbally acknowledged these results.   Electronically Signed   By: Rosalie Gums M.D.   On: 11/11/2014 15:53   11/11/2014   CLINICAL DATA:  EMS called out for weakness, decreased LOC, patient being treated for UTI currently. Pt has hx of myocardial infarction, stroke, HTN, AAA. Pt complains of abdominal pain and back pain but did not specify site.  EXAM: CT ANGIOGRAPHY CHEST, ABDOMEN AND PELVIS  TECHNIQUE: Multidetector CT imaging through the chest, abdomen and pelvis was performed using the standard protocol during bolus administration of intravenous contrast. Multiplanar reconstructed images and MIPs were obtained and reviewed to evaluate the vascular anatomy.  CONTRAST:  OMNIPAQUE IOHEXOL 350 MG/ML SOLN  COMPARISON:  CT chest 06/20/2013  FINDINGS: CTA CHEST FINDINGS  Heart: Heart size is normal. There is significant coronary artery calcification no pericardial effusion.  Vascular structures: Normal arch anatomy. There is significant atherosclerotic calcification of the thoracic aorta. Ascending aortic aneurysm is 4.5 cm. The pulmonary arteries appear grossly well opacified.  Mediastinum/thyroid: Small, nonspecific mediastinal are present.  Lungs/Airways: There are emphysematous changes throughout the lungs. Areas of scarring/ atelectasis are identified in the lung bases bilaterally. No focal consolidations or pleural effusions are identified. No suspicious nodules. Probable mucous is identified at the level of the thoracic inlet within the trachea. Otherwise the airways appear patent.  Chest wall/osseous structures: No suspicious lytic or blastic lesions are identified. No acute thoracic fracture.  Review of the MIP images confirms the above findings.  CTA ABDOMEN AND PELVIS FINDINGS  Vasculature: There is an infrarenal abdominal aortic aneurysm measuring 6.8 x 7.0 cm. Significant mural thrombus is present. There  is significant atherosclerotic calcification of the origin of the superior mesenteric artery approximately 50% narrowing of origin. There is dense atherosclerotic calcification of the celiac axis without significant narrowing of the origin. Bilateral single renal arteries with significant atherosclerotic calcification at the origins. Normally opacified inferior mesenteric artery with calcification at the origin. The iliac arteries are tortuous. Right iliac artery is mildly aneurysmal, measuring 1.5 cm. There is no evidence for abdominal aortic dissection.  Upper abdomen:  There is focal fatty infiltration adjacent to the falciform ligament of the liver. No focal abnormality within the spleen, pancreas, or right adrenal gland. A low-attenuation lesion within the left adrenal gland measures 1.8 x 1.7 cm. Based on density measurements this is likely a benign process. Kidney show normal bilateral excretion without focal mass.  Gastrointestinal tract: The stomach and small bowel loops are normal in appearance. Numerous colonic diverticula. No acute diverticulitis. No evidence for acute appendicitis.  Pelvis: The uterus is present. No adnexal mass or free pelvic fluid.  Retroperitoneum: No retroperitoneal or mesenteric adenopathy.  Abdominal wall: Unremarkable.  Osseous structures: There is significant degenerative change throughout the in lumbar spine. There is 6 mm of anterolisthesis of L3 on L4. Significant disc height loss and disc protrusion at L4-5. There is 3 mm of retrolisthesis of L1 on L2, associated with disc protrusion. No acute lumbar fracture.  Review of the MIP images confirms the above findings.  IMPRESSION: 1. No evidence for aortic dissection. 2. Ascending aortic aneurysm is 4.5 cm. 3. Large infrarenal abdominal aortic aneurysm is 7.0 cm with significant mural thrombus. 4. Significant atherosclerotic calcification of the abdominal vessels. 5. Left adrenal nodule 1.8 cm is favored to be benign based on  density measurements.  Electronically Signed: By: Rosalie Gums M.D. On: 11/11/2014 15:47   Ct Cta Abd/pel W/cm &/or W/o Cm  11/11/2014   ADDENDUM REPORT: 11/11/2014 15:53  ADDENDUM: Comparison prior studies chest CT 06/20/2013 and abdominal CT on 03/25/2012: The thoracic aortic aneurysm is stable. The abdominal aortic aneurysm has increased in size (previously 5.7 x 5.9 cm. )  Critical Value/emergent results were called by telephone at the time of interpretation on 11/11/2014 at 3:50 pm to Dr. Glynn Lowe , who verbally acknowledged these results.   Electronically Signed   By: Rosalie Gums M.D.   On: 11/11/2014 15:53   11/11/2014   CLINICAL DATA:  EMS called out for weakness, decreased LOC, patient being treated for UTI currently. Pt has hx of myocardial infarction, stroke, HTN, AAA. Pt complains of abdominal pain and back pain but did not specify site.  EXAM: CT ANGIOGRAPHY CHEST, ABDOMEN AND PELVIS  TECHNIQUE: Multidetector CT imaging through the chest, abdomen and pelvis was performed using the standard protocol during bolus administration of intravenous contrast. Multiplanar reconstructed images and MIPs were obtained and reviewed to evaluate the vascular anatomy.  CONTRAST:  OMNIPAQUE IOHEXOL 350 MG/ML SOLN  COMPARISON:  CT chest 06/20/2013  FINDINGS: CTA CHEST FINDINGS  Heart: Heart size is normal. There is significant coronary artery calcification no pericardial effusion.  Vascular structures: Normal arch anatomy. There is significant atherosclerotic calcification of the thoracic aorta. Ascending aortic aneurysm is 4.5 cm. The pulmonary arteries appear grossly well opacified.  Mediastinum/thyroid: Small, nonspecific mediastinal are present.  Lungs/Airways: There are emphysematous changes throughout the lungs. Areas of scarring/ atelectasis are identified in the lung bases bilaterally. No focal consolidations or pleural effusions are identified. No suspicious nodules. Probable mucous is  identified at the level of the thoracic inlet within the trachea. Otherwise the airways appear patent.  Chest wall/osseous structures: No suspicious lytic or blastic lesions are identified. No acute thoracic fracture.  Review of the MIP images confirms the above findings.  CTA ABDOMEN AND PELVIS FINDINGS  Vasculature: There is an infrarenal abdominal aortic aneurysm measuring 6.8 x 7.0 cm. Significant mural thrombus is present. There is significant atherosclerotic calcification of the origin of the superior mesenteric artery approximately 50% narrowing of origin. There is  dense atherosclerotic calcification of the celiac axis without significant narrowing of the origin. Bilateral single renal arteries with significant atherosclerotic calcification at the origins. Normally opacified inferior mesenteric artery with calcification at the origin. The iliac arteries are tortuous. Right iliac artery is mildly aneurysmal, measuring 1.5 cm. There is no evidence for abdominal aortic dissection.  Upper abdomen: There is focal fatty infiltration adjacent to the falciform ligament of the liver. No focal abnormality within the spleen, pancreas, or right adrenal gland. A low-attenuation lesion within the left adrenal gland measures 1.8 x 1.7 cm. Based on density measurements this is likely a benign process. Kidney show normal bilateral excretion without focal mass.  Gastrointestinal tract: The stomach and small bowel loops are normal in appearance. Numerous colonic diverticula. No acute diverticulitis. No evidence for acute appendicitis.  Pelvis: The uterus is present. No adnexal mass or free pelvic fluid.  Retroperitoneum: No retroperitoneal or mesenteric adenopathy.  Abdominal wall: Unremarkable.  Osseous structures: There is significant degenerative change throughout the in lumbar spine. There is 6 mm of anterolisthesis of L3 on L4. Significant disc height loss and disc protrusion at L4-5. There is 3 mm of retrolisthesis of L1  on L2, associated with disc protrusion. No acute lumbar fracture.  Review of the MIP images confirms the above findings.  IMPRESSION: 1. No evidence for aortic dissection. 2. Ascending aortic aneurysm is 4.5 cm. 3. Large infrarenal abdominal aortic aneurysm is 7.0 cm with significant mural thrombus. 4. Significant atherosclerotic calcification of the abdominal vessels. 5. Left adrenal nodule 1.8 cm is favored to be benign based on density measurements.  Electronically Signed: By: Rosalie Gums M.D. On: 11/11/2014 15:47     EKG Interpretation   Date/Time:  Thursday November 11 2014 11:52:26 EST Ventricular Rate:  80 PR Interval:  221 QRS Duration: 98 QT Interval:  396 QTC Calculation: 457 R Axis:   15 Text Interpretation:  Sinus rhythm Prolonged PR interval Borderline T wave  abnormalities Nonspecific ST and T wave abnormality Confirmed by Manus Gunning   MD, Tumeka Chimenti [phone]) on 11/11/2014 12:03:07 PM      MDM   Final diagnoses:  Altered mental state  Abdominal pain  patient from home with 4 day history of progressively worsening weakness confusion and mental status change. Seen at [facility] 5 days ago for same. Given Levaquin for UTI. LP results negative per family.  Records obtained from [facility] from November 7. Chest x-ray showed no acute disease. CT head showed no acute abnormalities. CT neck showed focal opacity and right upper lobe concerning for neoplastic disease. CSF showed 3 RBCs and 2 WBCs  Labs remarkable for hypokalemia. Patient with history of aortic thoracic aneurysm as well as abdominal aneurysm that is nonoperative per family's report.  UA negative. CT scan negative. Right upper lobe lesion not seen on today's CT scan. With history of AAA, back pain, abdominal pain, will evaluate aorta with CT.  Worsening and enlarging AAA discussed with Dr. Imogene Burn of vascular surgery. No evidence of leakage. He feels this will be nonoperative but is in the OR and will review  images. He feels she can stay at [facility].  Code status d/w patient and family.  They would like her to be DNR and "don't want her to suffer".  Hypokalemia repleted. D/w Dr. Sherrie Mustache.  She feels patient can stay at [facility] as patient will not be an operative candidate.  Continue IVF and KCl. No apparent indication for further antibiotics.  I personally performed the services described  in this documentation, which was scribed in my presence. The recorded information has been reviewed and is accurate.    Melanie Octave, MD 11/11/14 [phone]

## 2014-11-12 DIAGNOSIS — G9340 Encephalopathy, unspecified: Secondary | ICD-10-CM | POA: Diagnosis not present

## 2014-11-12 LAB — CBC
HEMATOCRIT: 35 % — AB (ref 36.0–46.0)
Hemoglobin: 11.7 g/dL — ABNORMAL LOW (ref 12.0–15.0)
MCH: 31.1 pg (ref 26.0–34.0)
MCHC: 33.4 g/dL (ref 30.0–36.0)
MCV: 93.1 fL (ref 78.0–100.0)
PLATELETS: 386 10*3/uL (ref 150–400)
RBC: 3.76 MIL/uL — ABNORMAL LOW (ref 3.87–5.11)
RDW: 13.3 % (ref 11.5–15.5)
WBC: 7.4 10*3/uL (ref 4.0–10.5)

## 2014-11-12 LAB — BASIC METABOLIC PANEL
Anion gap: 10 (ref 5–15)
BUN: 23 mg/dL (ref 6–23)
CO2: 30 mEq/L (ref 19–32)
Calcium: 9.5 mg/dL (ref 8.4–10.5)
Chloride: 94 mEq/L — ABNORMAL LOW (ref 96–112)
Creatinine, Ser: 1.09 mg/dL (ref 0.50–1.10)
GFR, EST AFRICAN AMERICAN: 51 mL/min — AB (ref >90)
GFR, EST NON AFRICAN AMERICAN: 44 mL/min — AB (ref >90)
GLUCOSE: 160 mg/dL — AB (ref 70–99)
POTASSIUM: 4.5 meq/L (ref 3.7–5.3)
Sodium: 134 mEq/L — ABNORMAL LOW (ref 137–147)

## 2014-11-12 LAB — TSH: TSH: 2.19 u[IU]/mL (ref 0.350–4.500)

## 2014-11-12 LAB — MAGNESIUM: Magnesium: 2.2 mg/dL (ref 1.5–2.5)

## 2014-11-12 LAB — VITAMIN B12: VITAMIN B 12: 410 pg/mL (ref 211–911)

## 2014-11-12 LAB — PROTIME-INR
INR: 1.31 (ref 0.00–1.49)
Prothrombin Time: 16.4 seconds — ABNORMAL HIGH (ref 11.6–15.2)

## 2014-11-12 NOTE — Plan of Care (Signed)
Problem: Phase I Progression Outcomes Goal: Pain controlled with appropriate interventions Outcome: Progressing     

## 2014-11-12 NOTE — Progress Notes (Signed)
INITIAL NUTRITION ASSESSMENT  DOCUMENTATION CODES Per approved criteria  -Not Applicable   INTERVENTION: Ensure Complete po BID, each supplement provides 350 kcal and 13 grams of protein   NUTRITION DIAGNOSIS: Inadequate oral intake related to acute encephalopathy as evidenced by 10% wt loss past 2 months .   Goal: Pt to meet >/= 90% of their estimated nutrition needs    Monitor: Po intake, labs and wt trends   Reason for Assessment: Malnutrition Screen   80 y.o. female  Admitting Dx: Acute encephalopathy  ASSESSMENT: Pt hx includes COPD, AAD, chronic anxiety and 2 aneurysms. She lives with her son who reports weakness and increased confusion prior to admission.  Pt tells me she is "eating good" but is unable to provide details of usual food intake. She has severe weight loss of 10% past 2 months. Pt has poor prognosis per MD. Noted fat mass depletion to upper arms and orbital region and muscle wasting to temporal, anterior thigh and patella regions.  Abnormal labs: Sodium-133 Potassium-2.9  Height: Ht Readings from Last 1 Encounters:  11/11/14 5\' 1"  (1.549 m)    Weight: Wt Readings from Last 1 Encounters:  11/12/14 132 lb 4.4 oz (60 kg)    Ideal Body Weight: 105#  % Ideal Body Weight: 126%  Wt Readings from Last 10 Encounters:  11/12/14 132 lb 4.4 oz (60 kg)  09/15/14 146 lb (66.225 kg)  09/05/14 150 lb (68.04 kg)  05/07/14 151 lb (68.493 kg)  01/24/14 151 lb 14.4 oz (68.9 kg)  12/10/13 150 lb (68.04 kg)  06/20/13 147 lb (66.679 kg)  12/09/12 162 lb (73.483 kg)  10/10/12 153 lb 6.4 oz (69.582 kg)  04/16/12 159 lb (72.122 kg)    Usual Body Weight: 150-160#  % Usual Body Weight: 90%  BMI:  Body mass index is 25.01 kg/(m^2).  overweight  Estimated Nutritional Needs: Kcal: 1500-1700 Protein: 70-80 gr Fluid: 1.5-1.7 liters daily   Skin: intact  Diet Order: DIET DYS 2  EDUCATION NEEDS: -No education needs identified at this time  No intake or  output data in the 24 hours ending 11/12/14 1530  Last BM: 11/12  Labs:   Recent Labs Lab 11/11/14 1217 11/11/14 1236 11/12/14 0612  NA 135* 133*  --   K 3.0* 2.9*  --   CL 89* 90*  --   CO2 32  --   --   BUN 17 15  --   CREATININE 0.99 1.00  --   CALCIUM 9.9  --   --   MG  --   --  2.2  GLUCOSE 114* 114*  --     CBG (last 3)  No results for input(s): GLUCAP in the last 72 hours.  Scheduled Meds: . albuterol  2.5 mg Nebulization TID  . aspirin EC  81 mg Oral Daily  . budesonide-formoterol  2 puff Inhalation BID  . citalopram  20 mg Oral QHS  . gabapentin  100 mg Oral TID  . heparin  5,000 Units Subcutaneous 3 times per day  . potassium chloride  30 mEq Oral BID  . tiotropium  18 mcg Inhalation Daily  . torsemide  20 mg Oral q morning - 10a    Continuous Infusions: . 0.9 % NaCl with KCl 20 mEq / L 50 mL/hr at 11/11/14 2211    Past Medical History  Diagnosis Date  . COPD (chronic obstructive pulmonary disease)   . Hypertension   . Anxiety   . Depression   .  Arthritis   . AAA (abdominal aortic aneurysm)   . Neuropathy   . Leg swelling   . Oxygen dependent   . Myocardial infarction   . Pneumonia   . Stroke     Past Surgical History  Procedure Laterality Date  . Cholecystectomy    . Appendectomy      Royann ShiversLynn Dorethy Tomey MS,RD,CSG,LDN Office: [phone] Pager: [phone]

## 2014-11-12 NOTE — Care Management Utilization Note (Signed)
UR completed 

## 2014-11-12 NOTE — Evaluation (Signed)
Physical Therapy Evaluation Patient Details Name: Melanie Lowe MRN: [id number] DOB: 25-Feb-1924 Today's Date: 11/12/2014   History of Present Illness  Melanie Lowe is a 80 y.o. female with a history of chronic abdominal aortic aneurysm, oxygen dependent COPD, coronary artery disease, and chronic anxiety. She presents to the emergency department with confusion and generalized weakness per her family. The history is provided by the patient and her son, Melanie Lowe. She has had intermittent confusion for the past couple weeks. She was seen and evaluated at [facility] last week for similar or the same symptoms consisting of confusion and generalized weakness.  Clinical Impression  Pt presents with decreased activity tolerance and mobility affecting her independence. Pt was able to ambulate in the hall over 16820ft with RW close supervision and assistance with negotiating the RW around obstacles. O2 sats 84-92% on 2 liters. Pt will have family support at home. Recommend continued acute PT to maximize mobility and Independence for return home.    Follow Up Recommendations Home health PT    Equipment Recommendations  None recommended by PT    Recommendations for Other Services       Precautions / Restrictions Precautions Precautions: Fall Restrictions Weight Bearing Restrictions: No      Mobility  Bed Mobility Overal bed mobility:  (not tested)                Transfers Overall transfer level: Needs assistance Equipment used: Rolling walker (2 wheeled) Transfers: Sit to/from Stand Sit to Stand: Supervision         General transfer comment: cues for improved safety with hand positioning.  Ambulation/Gait Ambulation/Gait assistance: Supervision Ambulation Distance (Feet): 120 Feet Assistive device: Rolling walker (2 wheeled) Gait Pattern/deviations: Step-through pattern;Decreased stride length Gait velocity: decreased Gait velocity interpretation: Below normal speed for  age/gender General Gait Details: therapist needed to guide the RW around obsticles at times. Verbal cues to stay inside the rw for safety. O2 sats 84-92% with gait.  Stairs            Wheelchair Mobility    Modified Rankin (Stroke Patients Only)       Balance Overall balance assessment: Needs assistance           Standing balance-Leahy Scale: Fair                               Pertinent Vitals/Pain Pain Assessment: No/denies pain    Home Living Family/patient expects to be discharged to:: Private residence Living Arrangements: Children Available Help at Discharge: Available 24 hours/day;Family Type of Home: House Home Access: Stairs to enter Entrance Stairs-Rails: Can reach both Entrance Stairs-Number of Steps: 2 Home Layout: Two level;Able to live on main level with bedroom/bathroom Home Equipment: Dan HumphreysWalker - 2 wheels;Cane - single point;Wheelchair - manual;Bedside commode      Prior Function Level of Independence: Independent               Hand Dominance        Extremity/Trunk Assessment   Upper Extremity Assessment: Defer to OT evaluation           Lower Extremity Assessment: Overall WFL for tasks assessed         Communication   Communication: No difficulties  Cognition Arousal/Alertness: Awake/alert Behavior During Therapy: WFL for tasks assessed/performed Overall Cognitive Status: Within Functional Limits for tasks assessed  General Comments      Exercises        Assessment/Plan    PT Assessment Patient needs continued PT services  PT Diagnosis Difficulty walking   PT Problem List Decreased activity tolerance;Decreased balance;Decreased mobility  PT Treatment Interventions DME instruction;Gait training;Functional mobility training;Therapeutic activities;Therapeutic exercise;Balance training;Patient/family education   PT Goals (Current goals can be found in the Care Plan section)  Acute Rehab PT Goals Patient Stated Goal: non stated PT Goal Formulation: With patient Time For Goal Achievement: 11/26/14 Potential to Achieve Goals: Good    Frequency Min 3X/week   Barriers to discharge        Co-evaluation               End of Session Equipment Utilized During Treatment: Gait belt;Oxygen Activity Tolerance: Patient limited by fatigue Patient left: in chair;with call bell/phone within reach Nurse Communication: Mobility status         Time: 6945-0388 PT Time Calculation (min) (ACUTE ONLY): 29 min   Charges:   PT Evaluation $Initial PT Evaluation Tier I: 1 Procedure PT Treatments $Gait Training: 8-22 mins   PT G Codes:          Melanie Lowe, Melanie Lowe 11/12/2014, 11:53 AM

## 2014-11-12 NOTE — Care Management Note (Signed)
    Page 1 of 1   11/15/2014     9:20:55 AM CARE MANAGEMENT NOTE 11/15/2014  Patient:  Melanie Lowe, Melanie Lowe   Account Number:  [id number]  Date Initiated:  11/12/2014  Documentation initiated by:  Sharrie Rothman  Subjective/Objective Assessment:   Pt admitted from home with possible UTI and weakness. Pt lives with her son and will return home at discharge. Pt has a walker, home O2, and neb machine for home use.     Action/Plan:   PT recommends HH PT. Pt would like family to decide which [facility] agency to arrange [facility] with. Family to return call to CM with decision.   Anticipated DC Date:  11/14/2014   Anticipated DC Plan:  HOME W HOME HEALTH SERVICES      DC Planning Services  CM consult      Choice offered to / List presented to:             Status of service:  Completed, signed off Medicare Important Message given?  YES (If response is "NO", the following Medicare IM given date fields will be blank) Date Medicare IM given:  11/12/2014 Medicare IM given by:  Arlyss Queen C Date Additional Medicare IM given:  11/15/2014 Additional Medicare IM given by:  Sharrie Rothman  Discharge Disposition:  HOME/SELF CARE  Per UR Regulation:    If discussed at Long Length of Stay Meetings, dates discussed:    Comments:  11/15/14 0915 Arlyss Queen, RN BSN CM Pt for discharge today. Pt deferred to daughter in law for choice of HH PT agency. Pts daughter in law stated that at this time they are in the process of moving to [facility] and she does not want any HH at this time arranged. Pt has a Radiation protection practitioner that comes out weekly. Pts daughter in law stated that if PT could show her what exercises to do with pt she could carry that out. Cm did instruct daughter in law that once they have moved and ready for [facility] PT to contact PCP and Rn with [facility] and they could arrange.  11/12/14 1330 Arlyss Queen, RN BSN CM

## 2014-11-13 LAB — BASIC METABOLIC PANEL
Anion gap: 9 (ref 5–15)
BUN: 19 mg/dL (ref 6–23)
CO2: 28 mEq/L (ref 19–32)
CREATININE: 0.87 mg/dL (ref 0.50–1.10)
Calcium: 9.1 mg/dL (ref 8.4–10.5)
Chloride: 99 mEq/L (ref 96–112)
GFR calc Af Amer: 67 mL/min — ABNORMAL LOW (ref >90)
GFR, EST NON AFRICAN AMERICAN: 58 mL/min — AB (ref >90)
GLUCOSE: 101 mg/dL — AB (ref 70–99)
POTASSIUM: 4.9 meq/L (ref 3.7–5.3)
Sodium: 136 mEq/L — ABNORMAL LOW (ref 137–147)

## 2014-11-13 NOTE — Progress Notes (Signed)
Patient comfortable alert and oriented joking as UTI on Levaquin COPD CAD 4.5 cm ascending aneurysm 7.0 cm infrarenal aneurysm history of anxiety Melanie Lowe XAJ:[id number] DOB: Apr 18, 1924 DOA: 11/11/2014 PCP: Alonza Bogus, MD             Physical Exam: Blood pressure 104/55, pulse 69, temperature 98.2 F (36.8 C), temperature source Oral, resp. rate 20, height 5' 1"  (1.549 m), weight 136 lb 14.5 oz (62.1 kg), SpO2 96 %.no JVD no carotid bruits no thyromegaly lungs show prolonged expiratory phase diminished breath sounds at bases no rales wheeze or rhonchi appreciable   Investigations:  No results found for this or any previous visit (from the past 240 hour(s)).   Basic Metabolic Panel:  Recent Labs  11/12/14 0612 11/12/14 1454 11/13/14 0618  NA  --  134* 136*  K  --  4.5 4.9  CL  --  94* 99  CO2  --  30 28  GLUCOSE  --  160* 101*  BUN  --  23 19  CREATININE  --  1.09 0.87  CALCIUM  --  9.5 9.1  MG 2.2  --   --    Liver Function Tests: No results for input(s): AST, ALT, ALKPHOS, BILITOT, PROT, ALBUMIN in the last 72 hours.   CBC:  Recent Labs  11/11/14 1217 11/11/14 1236 11/12/14 0612  WBC 7.8  --  7.4  HGB 12.0 11.9* 11.7*  HCT 36.1 35.0* 35.0*  MCV 92.3  --  93.1  PLT 380  --  386    Ct Head Wo Contrast  11/11/2014   CLINICAL DATA:  Altered mental status. Decreased level of consciousness.  EXAM: CT HEAD WITHOUT CONTRAST  CT CERVICAL SPINE WITHOUT CONTRAST  TECHNIQUE: Multidetector CT imaging of the head and cervical spine was performed following the standard protocol without intravenous contrast. Multiplanar CT image reconstructions of the cervical spine were also generated.  COMPARISON:  11/06/2014  FINDINGS: CT HEAD FINDINGS  Skull and Sinuses:Chronic small right mastoid effusion or mucosal thickening. Mild ethmoid mucosal thickening.  No acute fracture destructive process.  Orbits: No acute abnormality.  Brain: No evidence of acute abnormality, such  as acute infarction, hemorrhage, hydrocephalus, or mass lesion/mass effect. Stable pattern of chronic small-vessel ischemic injury with discrete small-vessel insult in the right anterior putamen and anterior limb internal capsule. Cerebral volume loss which is normal for age.  CT CERVICAL SPINE FINDINGS  There is reversal of cervical lordosis and slight C4-5 anterolisthesis which is chronic based on previous imaging. No acute fracture. No gross cervical canal hematoma or prevertebral edema. Focally advanced degenerative disc disease at C5-6 and C6-7 with bilateral uncovertebral spurring causing foraminal stenosis. Hemangioma present in the T2 right pedicle.  Biapical centrilobular emphysema. Mucus noted in the trachea. Tortuous great vessels at the thoracic inlet  IMPRESSION: 1. No acute intracranial or cervical spine findings. 2. Chronic and degenerative changes are noted above.   Electronically Signed   By: Jorje Guild M.D.   On: 11/11/2014 13:58   Ct Cervical Spine Wo Contrast  11/11/2014   CLINICAL DATA:  Altered mental status with concern for fall.  Pain  EXAM: CT HEAD WITHOUT CONTRAST  CT CERVICAL SPINE WITHOUT CONTRAST  TECHNIQUE: Multidetector CT imaging of the head and cervical spine was performed following the standard protocol without intravenous contrast. Multiplanar CT image reconstructions of the cervical spine were also generated.  COMPARISON:  CT head November 06, 2014; CT cervical spine September 15, 2014  FINDINGS: CT  HEAD FINDINGS  There is mild diffuse atrophy. There is no intracranial mass, hemorrhage, extra-axial fluid collection, or midline shift. There is a prior focal infarct involving the right putamen. There is patchy small vessel disease in the centra semiovale bilaterally, stable. There is no new gray-white compartment lesion. No demonstrable acute infarct. The bony calvarium appears intact. The mastoid air cells are clear.  CT CERVICAL SPINE FINDINGS  There is no fracture or  spondylolisthesis. Prevertebral soft tissues and predental space regions are normal. There is moderately severe disc space narrowing at C5-6 and C6-7, stable. There is no new disc space narrowing. There is facet hypertrophy at multiple levels bilaterally. There is diffuse disc protrusion and bony hypertrophy at C5-6 and C6-7 bilaterally. There is rather marked exit foraminal narrowing at C5-6 and C6-7 bilaterally with impression on the exiting nerve roots at these levels bilaterally. No frank disc extrusion or stenosis. There are scattered foci of carotid artery calcification bilaterally. There is chronic reversal of lordotic curvature.  IMPRESSION: CT head: Atrophy with patchy periventricular small vessel disease. Prior infarct right putamen. No acute infarct apparent. No intracranial mass, hemorrhage, or acute appearing infarct.  CT cervical spine: Multilevel osteoarthritic change. Note that there is marked exit foraminal narrowing with nerve root effacement at C5-6 and C6-7 bilaterally due to bony hypertrophy and diffuse disc bulging. No frank disc extrusion or stenosis. No fracture or spondylolisthesis. Probable chronic muscle spasm given the chronic reversal of lordotic curvature. Bilateral carotid artery calcification.   Electronically Signed   By: Lowella Grip M.D.   On: 11/11/2014 13:49   Dg Chest Portable 1 View  11/11/2014   CLINICAL DATA:  Five day history of altered mental status. COPD. Hypertension  EXAM: PORTABLE CHEST - 1 VIEW  COMPARISON:  November 06, 2014 and January 24, 2014  FINDINGS: There is patchy atelectatic change in both mid and lower lung zones, stable. There is no frank edema or consolidation. Heart is mildly enlarged with pulmonary vascularity within normal limits. There is atherosclerotic change in aorta. Aorta is somewhat tortuous. No adenopathy. Bones are osteoporotic.  IMPRESSION: Patchy atelectasis bilaterally, stable. No edema or consolidation. Stable cardiac prominence.  Aortic prominence and tortuosity probably reflect chronic hypertensive change.   Electronically Signed   By: Lowella Grip M.D.   On: 11/11/2014 12:55   Ct Angio Chest Aorta W/cm &/or Wo/cm  11/11/2014   ADDENDUM REPORT: 11/11/2014 15:53  ADDENDUM: Comparison prior studies chest CT 06/20/2013 and abdominal CT on 03/25/2012: The thoracic aortic aneurysm is stable. The abdominal aortic aneurysm has increased in size (previously 5.7 x 5.9 cm. )  Critical Value/emergent results were called by telephone at the time of interpretation on 11/11/2014 at 3:50 pm to Dr. Ezequiel Essex , who verbally acknowledged these results.   Electronically Signed   By: Shon Hale M.D.   On: 11/11/2014 15:53   11/11/2014   CLINICAL DATA:  EMS called out for weakness, decreased LOC, patient being treated for UTI currently. Pt has hx of myocardial infarction, stroke, HTN, AAA. Pt complains of abdominal pain and back pain but did not specify site.  EXAM: CT ANGIOGRAPHY CHEST, ABDOMEN AND PELVIS  TECHNIQUE: Multidetector CT imaging through the chest, abdomen and pelvis was performed using the standard protocol during bolus administration of intravenous contrast. Multiplanar reconstructed images and MIPs were obtained and reviewed to evaluate the vascular anatomy.  CONTRAST:  138m OMNIPAQUE IOHEXOL 350 MG/ML SOLN  COMPARISON:  CT chest 06/20/2013  FINDINGS: CTA CHEST FINDINGS  Heart: Heart size is normal. There is significant coronary artery calcification no pericardial effusion.  Vascular structures: Normal arch anatomy. There is significant atherosclerotic calcification of the thoracic aorta. Ascending aortic aneurysm is 4.5 cm. The pulmonary arteries appear grossly well opacified.  Mediastinum/thyroid: Small, nonspecific mediastinal are present.  Lungs/Airways: There are emphysematous changes throughout the lungs. Areas of scarring/ atelectasis are identified in the lung bases bilaterally. No focal consolidations or pleural  effusions are identified. No suspicious nodules. Probable mucous is identified at the level of the thoracic inlet within the trachea. Otherwise the airways appear patent.  Chest wall/osseous structures: No suspicious lytic or blastic lesions are identified. No acute thoracic fracture.  Review of the MIP images confirms the above findings.  CTA ABDOMEN AND PELVIS FINDINGS  Vasculature: There is an infrarenal abdominal aortic aneurysm measuring 6.8 x 7.0 cm. Significant mural thrombus is present. There is significant atherosclerotic calcification of the origin of the superior mesenteric artery approximately 50% narrowing of origin. There is dense atherosclerotic calcification of the celiac axis without significant narrowing of the origin. Bilateral single renal arteries with significant atherosclerotic calcification at the origins. Normally opacified inferior mesenteric artery with calcification at the origin. The iliac arteries are tortuous. Right iliac artery is mildly aneurysmal, measuring 1.5 cm. There is no evidence for abdominal aortic dissection.  Upper abdomen: There is focal fatty infiltration adjacent to the falciform ligament of the liver. No focal abnormality within the spleen, pancreas, or right adrenal gland. A low-attenuation lesion within the left adrenal gland measures 1.8 x 1.7 cm. Based on density measurements this is likely a benign process. Kidney show normal bilateral excretion without focal mass.  Gastrointestinal tract: The stomach and small bowel loops are normal in appearance. Numerous colonic diverticula. No acute diverticulitis. No evidence for acute appendicitis.  Pelvis: The uterus is present. No adnexal mass or free pelvic fluid.  Retroperitoneum: No retroperitoneal or mesenteric adenopathy.  Abdominal wall: Unremarkable.  Osseous structures: There is significant degenerative change throughout the in lumbar spine. There is 6 mm of anterolisthesis of L3 on L4. Significant disc height loss  and disc protrusion at L4-5. There is 3 mm of retrolisthesis of L1 on L2, associated with disc protrusion. No acute lumbar fracture.  Review of the MIP images confirms the above findings.  IMPRESSION: 1. No evidence for aortic dissection. 2. Ascending aortic aneurysm is 4.5 cm. 3. Large infrarenal abdominal aortic aneurysm is 7.0 cm with significant mural thrombus. 4. Significant atherosclerotic calcification of the abdominal vessels. 5. Left adrenal nodule 1.8 cm is favored to be benign based on density measurements.  Electronically Signed: By: Shon Hale M.D. On: 11/11/2014 15:47   Ct Cta Abd/pel W/cm &/or W/o Cm  11/11/2014   ADDENDUM REPORT: 11/11/2014 15:53  ADDENDUM: Comparison prior studies chest CT 06/20/2013 and abdominal CT on 03/25/2012: The thoracic aortic aneurysm is stable. The abdominal aortic aneurysm has increased in size (previously 5.7 x 5.9 cm. )  Critical Value/emergent results were called by telephone at the time of interpretation on 11/11/2014 at 3:50 pm to Dr. Ezequiel Essex , who verbally acknowledged these results.   Electronically Signed   By: Shon Hale M.D.   On: 11/11/2014 15:53   11/11/2014   CLINICAL DATA:  EMS called out for weakness, decreased LOC, patient being treated for UTI currently. Pt has hx of myocardial infarction, stroke, HTN, AAA. Pt complains of abdominal pain and back pain but did not specify site.  EXAM: CT ANGIOGRAPHY CHEST, ABDOMEN AND  PELVIS  TECHNIQUE: Multidetector CT imaging through the chest, abdomen and pelvis was performed using the standard protocol during bolus administration of intravenous contrast. Multiplanar reconstructed images and MIPs were obtained and reviewed to evaluate the vascular anatomy.  CONTRAST:  149m OMNIPAQUE IOHEXOL 350 MG/ML SOLN  COMPARISON:  CT chest 06/20/2013  FINDINGS: CTA CHEST FINDINGS  Heart: Heart size is normal. There is significant coronary artery calcification no pericardial effusion.  Vascular structures: Normal  arch anatomy. There is significant atherosclerotic calcification of the thoracic aorta. Ascending aortic aneurysm is 4.5 cm. The pulmonary arteries appear grossly well opacified.  Mediastinum/thyroid: Small, nonspecific mediastinal are present.  Lungs/Airways: There are emphysematous changes throughout the lungs. Areas of scarring/ atelectasis are identified in the lung bases bilaterally. No focal consolidations or pleural effusions are identified. No suspicious nodules. Probable mucous is identified at the level of the thoracic inlet within the trachea. Otherwise the airways appear patent.  Chest wall/osseous structures: No suspicious lytic or blastic lesions are identified. No acute thoracic fracture.  Review of the MIP images confirms the above findings.  CTA ABDOMEN AND PELVIS FINDINGS  Vasculature: There is an infrarenal abdominal aortic aneurysm measuring 6.8 x 7.0 cm. Significant mural thrombus is present. There is significant atherosclerotic calcification of the origin of the superior mesenteric artery approximately 50% narrowing of origin. There is dense atherosclerotic calcification of the celiac axis without significant narrowing of the origin. Bilateral single renal arteries with significant atherosclerotic calcification at the origins. Normally opacified inferior mesenteric artery with calcification at the origin. The iliac arteries are tortuous. Right iliac artery is mildly aneurysmal, measuring 1.5 cm. There is no evidence for abdominal aortic dissection.  Upper abdomen: There is focal fatty infiltration adjacent to the falciform ligament of the liver. No focal abnormality within the spleen, pancreas, or right adrenal gland. A low-attenuation lesion within the left adrenal gland measures 1.8 x 1.7 cm. Based on density measurements this is likely a benign process. Kidney show normal bilateral excretion without focal mass.  Gastrointestinal tract: The stomach and small bowel loops are normal in  appearance. Numerous colonic diverticula. No acute diverticulitis. No evidence for acute appendicitis.  Pelvis: The uterus is present. No adnexal mass or free pelvic fluid.  Retroperitoneum: No retroperitoneal or mesenteric adenopathy.  Abdominal wall: Unremarkable.  Osseous structures: There is significant degenerative change throughout the in lumbar spine. There is 6 mm of anterolisthesis of L3 on L4. Significant disc height loss and disc protrusion at L4-5. There is 3 mm of retrolisthesis of L1 on L2, associated with disc protrusion. No acute lumbar fracture.  Review of the MIP images confirms the above findings.  IMPRESSION: 1. No evidence for aortic dissection. 2. Ascending aortic aneurysm is 4.5 cm. 3. Large infrarenal abdominal aortic aneurysm is 7.0 cm with significant mural thrombus. 4. Significant atherosclerotic calcification of the abdominal vessels. 5. Left adrenal nodule 1.8 cm is favored to be benign based on density measurements.  Electronically Signed: By: BShon HaleM.D. On: 11/11/2014 15:47      Medications:  Impression: UTI CAD O2 dependent COPD 4.5 cm ascending and 7.0 cm infrarenal aneurysm of aorta Principal Problem:   Acute encephalopathy Active Problems:   Essential hypertension   Abdominal aortic aneurysm   Chronic respiratory failure with hypoxia   Generalized weakness   Hypokalemia   Bradycardia     Plan:continue Levaquin monitor CBC and be met   Consultants:    Procedures   Antibiotics: Levaquin 500 mg IV daily  Code Status   Family Communication:    Disposition Plan continue Levaquin monitor renal function CBD C and respiratory status. nonoperative abdominal aortic aneurysm is due to comorbidities  Time spent:30 minutes   LOS: 2 days   Sherline Eberwein M   11/13/2014, 11:17 AM

## 2014-11-14 LAB — BASIC METABOLIC PANEL
Anion gap: 8 (ref 5–15)
BUN: 15 mg/dL (ref 6–23)
CO2: 27 mEq/L (ref 19–32)
CREATININE: 0.83 mg/dL (ref 0.50–1.10)
Calcium: 9.3 mg/dL (ref 8.4–10.5)
Chloride: 101 mEq/L (ref 96–112)
GFR calc Af Amer: 71 mL/min — ABNORMAL LOW (ref >90)
GFR calc non Af Amer: 61 mL/min — ABNORMAL LOW (ref >90)
GLUCOSE: 90 mg/dL (ref 70–99)
POTASSIUM: 4.8 meq/L (ref 3.7–5.3)
Sodium: 136 mEq/L — ABNORMAL LOW (ref 137–147)

## 2014-11-14 LAB — CBC WITH DIFFERENTIAL/PLATELET
Basophils Absolute: 0 10*3/uL (ref 0.0–0.1)
Basophils Relative: 0 % (ref 0–1)
EOS ABS: 0.2 10*3/uL (ref 0.0–0.7)
EOS PCT: 3 % (ref 0–5)
HCT: 32.5 % — ABNORMAL LOW (ref 36.0–46.0)
HEMOGLOBIN: 10.7 g/dL — AB (ref 12.0–15.0)
LYMPHS ABS: 1.5 10*3/uL (ref 0.7–4.0)
Lymphocytes Relative: 23 % (ref 12–46)
MCH: 31.2 pg (ref 26.0–34.0)
MCHC: 32.9 g/dL (ref 30.0–36.0)
MCV: 94.8 fL (ref 78.0–100.0)
MONOS PCT: 12 % (ref 3–12)
Monocytes Absolute: 0.7 10*3/uL (ref 0.1–1.0)
Neutro Abs: 3.9 10*3/uL (ref 1.7–7.7)
Neutrophils Relative %: 62 % (ref 43–77)
Platelets: 348 10*3/uL (ref 150–400)
RBC: 3.43 MIL/uL — AB (ref 3.87–5.11)
RDW: 13.5 % (ref 11.5–15.5)
WBC: 6.3 10*3/uL (ref 4.0–10.5)

## 2014-11-14 LAB — LIPASE, BLOOD: Lipase: 24 U/L (ref 11–59)

## 2014-11-14 NOTE — Progress Notes (Signed)
Patient currently on Levaquin for UTI. Sitting up no respiratory distress talkative Melanie Lowe OEU:[id number] DOB: August 29, 1924 DOA: 11/11/2014 PCP: Fredirick Maudlin, MD             Physical Exam: Blood pressure 124/72, pulse 62, temperature 97.6 F (36.4 C), temperature source Oral, resp. rate 20, height 5\' 1"  (1.549 Lowe), weight 137 lb 5.6 oz (62.3 kg), SpO2 97 %.no JVD no carotid bruits no thyromegaly lungs show prolonged his return x-ray phase diminished breath sounds at the bases no rales wheeze or rhonchi appreciable. Heart regular rhythm no S3-S4 no heaves or rubs no CVA tenderness   Investigations:  No results found for this or any previous visit (from the past 240 hour(s)).   Basic Metabolic Panel:  Recent Labs  15/40/08 0612  11/13/14 0618 11/14/14 0618  NA  --   < > 136* 136*  K  --   < > 4.9 4.8  CL  --   < > 99 101  CO2  --   < > 28 27  GLUCOSE  --   < > 101* 90  BUN  --   < > 19 15  CREATININE  --   < > 0.87 0.83  CALCIUM  --   < > 9.1 9.3  MG 2.2  --   --   --   < > = values in this interval not displayed. Liver Function Tests: No results for input(s): AST, ALT, ALKPHOS, BILITOT, PROT, ALBUMIN in the last 72 hours.   CBC:  Recent Labs  11/12/14 0612 11/14/14 0618  WBC 7.4 6.3  NEUTROABS  --  3.9  HGB 11.7* 10.7*  HCT 35.0* 32.5*  MCV 93.1 94.8  PLT 386 348    No results found.    Medications:  Impression: UTI with delirium now resolved Principal Problem:   Acute encephalopathy Active Problems:   Essential hypertension   Abdominal aortic aneurysm   Chronic respiratory failure with hypoxia   Generalized weakness   Hypokalemia   Bradycardia     Plan:continue Levaquin consider discharge 24-48 hours  Consultants:    Procedures   Antibiotics: Levaquin 500 mg daily                  Code Status:   Family Communication:    Disposition Plan discharge one to 2 days  Time spent:30 minutes   LOS: 3 days    Melanie Lowe   11/14/2014, 1:31 PM

## 2014-11-15 LAB — BASIC METABOLIC PANEL
Anion gap: 8 (ref 5–15)
BUN: 13 mg/dL (ref 6–23)
CALCIUM: 9.3 mg/dL (ref 8.4–10.5)
CO2: 27 mEq/L (ref 19–32)
CREATININE: 0.8 mg/dL (ref 0.50–1.10)
Chloride: 98 mEq/L (ref 96–112)
GFR calc Af Amer: 74 mL/min — ABNORMAL LOW (ref >90)
GFR calc non Af Amer: 64 mL/min — ABNORMAL LOW (ref >90)
GLUCOSE: 87 mg/dL (ref 70–99)
Potassium: 4.7 mEq/L (ref 3.7–5.3)
Sodium: 133 mEq/L — ABNORMAL LOW (ref 137–147)

## 2014-11-15 NOTE — Progress Notes (Signed)
Physical Therapy Treatment Patient Details Name: TIJUANNA JENKINS MRN: [id number] DOB: 1924/01/19 Today's Date: 11/15/2014    History of Present Illness Karenna MANEH BA is a 80 y.o. female with a history of chronic abdominal aortic aneurysm, oxygen dependent COPD, coronary artery disease, and chronic anxiety. She presents to the emergency department with confusion and generalized weakness per her family. The history is provided by the patient and her son, [name]. She has had intermittent confusion for the past couple weeks. She was seen and evaluated at [facility] last week for similar or the same symptoms consisting of confusion and generalized weakness.    PT Comments    Pt's insurance Case Manager was present in the room and asked if we could give family a gait belt.  She voiced many concerns about the pt safety at home and perhaps some family problems with the care of the pt.  I did tell RN that we could not issue a gait belt but family could easily use a bathrobe tie, sheet or regular belt to assist pt in gait.  Pt had been sleeping all day and is scheduled to return home later today.  Pt will have stairs to negotiate  At home and I was concerned about her mobility status currently.  Pt did need min assist to transfer OOB but was able to ambulate 125' with 3L O2.  She needed very close guarding especially toward the end of gait due to significant fatigue.  The family is refusing HHPT and requested a written HEP which was provided.  Family was not present to receive instruction but written directions were clear and pt stated that she had done these previously.  Follow Up Recommendations   (family states that they are moving and do not want HHPT at this time)     Equipment Recommendations  None recommended by PT    Recommendations for Other Services       Precautions / Restrictions Precautions Precautions: Fall Restrictions Weight Bearing Restrictions: No    Mobility  Bed  Mobility Overal bed mobility: Needs Assistance Bed Mobility: Supine to Sit     Supine to sit: Min assist        Transfers  SBA                 General transfer comment: cues for improved safety with hand positioning.  Ambulation/Gait Ambulation/Gait assistance: Supervision Ambulation Distance (Feet): 125 Feet Assistive device: Rolling walker (2 wheeled) Gait Pattern/deviations: Trunk flexed   Gait velocity interpretation: Below normal speed for age/gender General Gait Details: after ambulating about 82' pt began to fatigue and had some difficulty maintaining gait stabillity   Stairs            Wheelchair Mobility    Modified Rankin (Stroke Patients Only)       Balance Overall balance assessment: Needs assistance Sitting-balance support: No upper extremity supported Sitting balance-Leahy Scale: Normal     Standing balance support: Bilateral upper extremity supported;During functional activity Standing balance-Leahy Scale: Fair                      Cognition Arousal/Alertness: Awake/alert Behavior During Therapy: WFL for tasks assessed/performed Overall Cognitive Status: Within Functional Limits for tasks assessed                      Exercises      General Comments        Pertinent Vitals/Pain Pain Assessment: No/denies pain  Home Living                      Prior Function            PT Goals (current goals can now be found in the care plan section) Progress towards PT goals: Progressing toward goals    Frequency  Min 3X/week    PT Plan Current plan remains appropriate    Co-evaluation             End of Session Equipment Utilized During Treatment: Gait belt;Oxygen Activity Tolerance: Patient limited by fatigue Patient left: in chair;with call bell/phone within reach;with chair alarm set     Time: [id number] PT Time Calculation (min) (ACUTE ONLY): 38 min  Charges:  $Gait Training: 8-22  mins                    G Codes:      Konrad PentaBrown, Tarren Velardi L 11/15/2014, 3:52 PM

## 2014-11-15 NOTE — Plan of Care (Signed)
Problem: Phase I Progression Outcomes Goal: Pain controlled with appropriate interventions Outcome: Completed/Met Date Met:  11/15/14 Goal: OOB as tolerated unless otherwise ordered Outcome: Completed/Met Date Met:  11/15/14 Goal: Initial discharge plan identified Outcome: Completed/Met Date Met:  11/15/14 Goal: Voiding-avoid urinary catheter unless indicated Outcome: Completed/Met Date Met:  11/15/14 Goal: Hemodynamically stable Outcome: Completed/Met Date Met:  11/15/14 Goal: Other Phase I Outcomes/Goals Outcome: Not Applicable Date Met:  46/12/43

## 2014-11-15 NOTE — Progress Notes (Signed)
Subjective: She says she feels okay. She has no new complaints. She still feels weak  Objective: Vital signs in last 24 hours: Temp:  [97.5 F (36.4 C)-99.8 F (37.7 C)] 99.8 F (37.7 C) (11/16 0552) Pulse Rate:  [60-77] 63 (11/16 0552) Resp:  [20] 20 (11/16 0552) BP: (95-119)/(51-69) 119/69 mmHg (11/16 0552) SpO2:  [94 %-100 %] 97 % (11/16 0707) Weight:  [65.817 kg (145 lb 1.6 oz)] 65.817 kg (145 lb 1.6 oz) (11/16 0552) Weight change: 3.517 kg (7 lb 12.1 oz) Last BM Date: 11/12/14  Intake/Output from previous day: 11/15 0701 - 11/16 0700 In: 720 [P.O.:720] Out: -   PHYSICAL EXAM General appearance: alert, cooperative and mild distress Resp: rhonchi bilaterally Cardio: regular rate and rhythm, S1, S2 normal, no murmur, click, rub or gallop GI: soft, non-tender; bowel sounds normal; no masses,  no organomegaly Extremities: extremities normal, atraumatic, no cyanosis or edema  Lab Results:  Results for orders placed or performed during the hospital encounter of 11/11/14 (from the past 48 hour(s))  Basic metabolic panel     Status: Abnormal   Collection Time: 11/14/14  6:18 AM  Result Value Ref Range   Sodium 136 (L) 137 - 147 mEq/L   Potassium 4.8 3.7 - 5.3 mEq/L   Chloride 101 96 - 112 mEq/L   CO2 27 19 - 32 mEq/L   Glucose, Bld 90 70 - 99 mg/dL   BUN 15 6 - 23 mg/dL   Creatinine, Ser 0.83 0.50 - 1.10 mg/dL   Calcium 9.3 8.4 - 10.5 mg/dL   GFR calc non Af Amer 61 (L) >90 mL/min   GFR calc Af Amer 71 (L) >90 mL/min    Comment: (NOTE) The eGFR has been calculated using the CKD EPI equation. This calculation has not been validated in all clinical situations. eGFR's persistently <90 mL/min signify possible Chronic Kidney Disease.    Anion gap 8 5 - 15  CBC with Differential     Status: Abnormal   Collection Time: 11/14/14  6:18 AM  Result Value Ref Range   WBC 6.3 4.0 - 10.5 K/uL   RBC 3.43 (L) 3.87 - 5.11 MIL/uL   Hemoglobin 10.7 (L) 12.0 - 15.0 g/dL   HCT 32.5  (L) 36.0 - 46.0 %   MCV 94.8 78.0 - 100.0 fL   MCH 31.2 26.0 - 34.0 pg   MCHC 32.9 30.0 - 36.0 g/dL   RDW 13.5 11.5 - 15.5 %   Platelets 348 150 - 400 K/uL   Neutrophils Relative % 62 43 - 77 %   Neutro Abs 3.9 1.7 - 7.7 K/uL   Lymphocytes Relative 23 12 - 46 %   Lymphs Abs 1.5 0.7 - 4.0 K/uL   Monocytes Relative 12 3 - 12 %   Monocytes Absolute 0.7 0.1 - 1.0 K/uL   Eosinophils Relative 3 0 - 5 %   Eosinophils Absolute 0.2 0.0 - 0.7 K/uL   Basophils Relative 0 0 - 1 %   Basophils Absolute 0.0 0.0 - 0.1 K/uL  Lipase, blood     Status: None   Collection Time: 11/14/14  1:40 PM  Result Value Ref Range   Lipase 24 11 - 59 U/L  Basic metabolic panel     Status: Abnormal   Collection Time: 11/15/14  6:25 AM  Result Value Ref Range   Sodium 133 (L) 137 - 147 mEq/L   Potassium 4.7 3.7 - 5.3 mEq/L   Chloride 98 96 - 112 mEq/L  CO2 27 19 - 32 mEq/L   Glucose, Bld 87 70 - 99 mg/dL   BUN 13 6 - 23 mg/dL   Creatinine, Ser 0.80 0.50 - 1.10 mg/dL   Calcium 9.3 8.4 - 10.5 mg/dL   GFR calc non Af Amer 64 (L) >90 mL/min   GFR calc Af Amer 74 (L) >90 mL/min    Comment: (NOTE) The eGFR has been calculated using the CKD EPI equation. This calculation has not been validated in all clinical situations. eGFR's persistently <90 mL/min signify possible Chronic Kidney Disease.    Anion gap 8 5 - 15    ABGS No results for input(s): PHART, PO2ART, TCO2, HCO3 in the last 72 hours.  Invalid input(s): PCO2 CULTURES No results found for this or any previous visit (from the past 240 hour(s)). Studies/Results: No results found.  Medications:  Prior to Admission:  Prescriptions prior to admission  Medication Sig Dispense Refill Last Dose  . albuterol (PROVENTIL) (2.5 MG/3ML) 0.083% nebulizer solution Take 2.5 mg by nebulization 3 (three) times daily.    11/11/2014  . aspirin EC 81 MG tablet Take 81 mg by mouth daily.   11/11/2014  . budesonide-formoterol (SYMBICORT) 160-4.5 MCG/ACT inhaler  Inhale 2 puffs into the lungs 2 (two) times daily.    11/11/2014  . citalopram (CELEXA) 20 MG tablet Take 20 mg by mouth at bedtime.    11/11/2014  . diazepam (VALIUM) 10 MG tablet Take 10 mg by mouth 3 (three) times daily as needed for anxiety.   11/11/2014  . gabapentin (NEURONTIN) 100 MG tablet Take 100 mg by mouth 5 (five) times daily.    11/11/2014  . HYDROcodone-acetaminophen (NORCO) 7.5-325 MG per tablet Take 1 tablet by mouth every 6 (six) hours as needed for moderate pain. (Patient taking differently: Take 1 tablet by mouth at bedtime. ) 15 tablet 0 11/10/2014 at Unknown time  . levofloxacin (LEVAQUIN) 500 MG tablet Take 1 tablet by mouth daily. Starting 11/08/2014 x 5 days.   11/11/2014  . tiotropium (SPIRIVA) 18 MCG inhalation capsule Place 18 mcg into inhaler and inhale every evening.    11/11/2014  . torsemide (DEMADEX) 20 MG tablet Take 20 mg by mouth every morning.    11/11/2014  . ondansetron (ZOFRAN ODT) 4 MG disintegrating tablet 4mg  ODT q4 hours prn nausea/vomit (Patient not taking: Reported on 11/11/2014) 12 tablet 0    Scheduled: . albuterol  2.5 mg Nebulization TID  . aspirin EC  81 mg Oral Daily  . budesonide-formoterol  2 puff Inhalation BID  . citalopram  20 mg Oral QHS  . gabapentin  100 mg Oral TID  . heparin  5,000 Units Subcutaneous 3 times per day  . tiotropium  18 mcg Inhalation Daily  . torsemide  20 mg Oral q morning - 10a   Continuous: . 0.9 % NaCl with KCl 20 mEq / L 50 mL/hr at 11/14/14 1007   FTD:[id number] **OR** acetaminophen, albuterol, ALPRAZolam, alum & mag hydroxide-simeth, HYDROcodone-acetaminophen, morphine injection, ondansetron **OR** ondansetron (ZOFRAN) IV, phenol  Assesment:she was admitted with encephalopathy which appears to be something is happening intermittently. It is not clear if this is related to central nervous system circulation problems and I don't think I can make a definitive diagnosis about what sort of encephalopathy this  is. She has multiple other medical problems including generalized weakness. She has chronic hypoxic respiratory failure. She has a very large abdominal aortic aneurysm and is not a surgical candidate  Principal Problem:   Acute  encephalopathy Active Problems:   Essential hypertension   Abdominal aortic aneurysm   Chronic respiratory failure with hypoxia   Generalized weakness   Hypokalemia   Bradycardia    Plan:she is at maximum hospital benefit. Hospice care was discussed with family on admission and I will see what they want to do when she comes into the office for follow-up    LOS: 4 days   Bradee Common L 11/15/2014, 8:55 AM

## 2014-11-15 NOTE — Progress Notes (Signed)
Patient called her son and they state they are on their way to the hospital now. I removed patients IV, took her to the bathroom and placed all discharge papers at bedside near computer. Will give report of patient leaving to on coming nurse.  Bennett Scrape RN

## 2014-11-22 ENCOUNTER — Encounter (HOSPITAL_COMMUNITY): Payer: Self-pay

## 2014-11-22 ENCOUNTER — Emergency Department (HOSPITAL_COMMUNITY): Payer: Medicare HMO

## 2014-11-22 ENCOUNTER — Emergency Department (HOSPITAL_COMMUNITY)
Admission: EM | Admit: 2014-11-22 | Discharge: 2014-11-22 | Disposition: A | Discharge status: Home / Self Care | Payer: Medicare HMO | Attending: Emergency Medicine | Admitting: Emergency Medicine

## 2014-11-22 DIAGNOSIS — F329 Major depressive disorder, single episode, unspecified: Secondary | ICD-10-CM | POA: Insufficient documentation

## 2014-11-22 DIAGNOSIS — I10 Essential (primary) hypertension: Secondary | ICD-10-CM | POA: Insufficient documentation

## 2014-11-22 DIAGNOSIS — Z9981 Dependence on supplemental oxygen: Secondary | ICD-10-CM | POA: Diagnosis not present

## 2014-11-22 DIAGNOSIS — Z8673 Personal history of transient ischemic attack (TIA), and cerebral infarction without residual deficits: Secondary | ICD-10-CM | POA: Diagnosis not present

## 2014-11-22 DIAGNOSIS — Z7951 Long term (current) use of inhaled steroids: Secondary | ICD-10-CM | POA: Insufficient documentation

## 2014-11-22 DIAGNOSIS — I252 Old myocardial infarction: Secondary | ICD-10-CM | POA: Diagnosis not present

## 2014-11-22 DIAGNOSIS — Z7982 Long term (current) use of aspirin: Secondary | ICD-10-CM | POA: Diagnosis not present

## 2014-11-22 DIAGNOSIS — Z87891 Personal history of nicotine dependence: Secondary | ICD-10-CM | POA: Diagnosis not present

## 2014-11-22 DIAGNOSIS — M1990 Unspecified osteoarthritis, unspecified site: Secondary | ICD-10-CM | POA: Diagnosis not present

## 2014-11-22 DIAGNOSIS — G629 Polyneuropathy, unspecified: Secondary | ICD-10-CM | POA: Diagnosis not present

## 2014-11-22 DIAGNOSIS — J449 Chronic obstructive pulmonary disease, unspecified: Secondary | ICD-10-CM | POA: Insufficient documentation

## 2014-11-22 DIAGNOSIS — R197 Diarrhea, unspecified: Secondary | ICD-10-CM | POA: Diagnosis not present

## 2014-11-22 DIAGNOSIS — Z8701 Personal history of pneumonia (recurrent): Secondary | ICD-10-CM | POA: Insufficient documentation

## 2014-11-22 DIAGNOSIS — R1111 Vomiting without nausea: Secondary | ICD-10-CM | POA: Diagnosis not present

## 2014-11-22 DIAGNOSIS — F419 Anxiety disorder, unspecified: Secondary | ICD-10-CM | POA: Insufficient documentation

## 2014-11-22 DIAGNOSIS — Z79899 Other long term (current) drug therapy: Secondary | ICD-10-CM | POA: Insufficient documentation

## 2014-11-22 DIAGNOSIS — R1110 Vomiting, unspecified: Secondary | ICD-10-CM

## 2014-11-22 DIAGNOSIS — E876 Hypokalemia: Secondary | ICD-10-CM

## 2014-11-22 DIAGNOSIS — R112 Nausea with vomiting, unspecified: Secondary | ICD-10-CM

## 2014-11-22 DIAGNOSIS — M549 Dorsalgia, unspecified: Secondary | ICD-10-CM | POA: Diagnosis present

## 2014-11-22 DIAGNOSIS — Z9089 Acquired absence of other organs: Secondary | ICD-10-CM | POA: Insufficient documentation

## 2014-11-22 LAB — COMPREHENSIVE METABOLIC PANEL
ALBUMIN: 2.7 g/dL — AB (ref 3.5–5.2)
ALK PHOS: 70 U/L (ref 39–117)
ALT: 11 U/L (ref 0–35)
AST: 13 U/L (ref 0–37)
Anion gap: 8 (ref 5–15)
BUN: 15 mg/dL (ref 6–23)
CO2: 34 mEq/L — ABNORMAL HIGH (ref 19–32)
Calcium: 9.1 mg/dL (ref 8.4–10.5)
Chloride: 93 mEq/L — ABNORMAL LOW (ref 96–112)
Creatinine, Ser: 0.82 mg/dL (ref 0.50–1.10)
GFR calc Af Amer: 72 mL/min — ABNORMAL LOW (ref >90)
GFR calc non Af Amer: 62 mL/min — ABNORMAL LOW (ref >90)
GLUCOSE: 97 mg/dL (ref 70–99)
POTASSIUM: 3.3 meq/L — AB (ref 3.7–5.3)
SODIUM: 135 meq/L — AB (ref 137–147)
Total Bilirubin: 0.3 mg/dL (ref 0.3–1.2)
Total Protein: 6.5 g/dL (ref 6.0–8.3)

## 2014-11-22 LAB — CBC WITH DIFFERENTIAL/PLATELET
Basophils Absolute: 0 10*3/uL (ref 0.0–0.1)
Basophils Relative: 0 % (ref 0–1)
EOS PCT: 2 % (ref 0–5)
Eosinophils Absolute: 0.1 10*3/uL (ref 0.0–0.7)
HEMATOCRIT: 32.3 % — AB (ref 36.0–46.0)
Hemoglobin: 10.8 g/dL — ABNORMAL LOW (ref 12.0–15.0)
LYMPHS ABS: 1.2 10*3/uL (ref 0.7–4.0)
Lymphocytes Relative: 23 % (ref 12–46)
MCH: 30.9 pg (ref 26.0–34.0)
MCHC: 33.4 g/dL (ref 30.0–36.0)
MCV: 92.6 fL (ref 78.0–100.0)
MONOS PCT: 11 % (ref 3–12)
Monocytes Absolute: 0.6 10*3/uL (ref 0.1–1.0)
NEUTROS ABS: 3.4 10*3/uL (ref 1.7–7.7)
Neutrophils Relative %: 64 % (ref 43–77)
PLATELETS: 357 10*3/uL (ref 150–400)
RBC: 3.49 MIL/uL — AB (ref 3.87–5.11)
RDW: 13.7 % (ref 11.5–15.5)
WBC: 5.3 10*3/uL (ref 4.0–10.5)

## 2014-11-22 LAB — URINALYSIS, ROUTINE W REFLEX MICROSCOPIC
Bilirubin Urine: NEGATIVE
GLUCOSE, UA: NEGATIVE mg/dL
HGB URINE DIPSTICK: NEGATIVE
Ketones, ur: NEGATIVE mg/dL
Leukocytes, UA: NEGATIVE
Nitrite: NEGATIVE
Protein, ur: NEGATIVE mg/dL
SPECIFIC GRAVITY, URINE: 1.015 (ref 1.005–1.030)
Urobilinogen, UA: 0.2 mg/dL (ref 0.0–1.0)
pH: 5 (ref 5.0–8.0)

## 2014-11-22 LAB — PROTIME-INR
INR: 1.25 (ref 0.00–1.49)
PROTHROMBIN TIME: 15.8 s — AB (ref 11.6–15.2)

## 2014-11-22 LAB — LIPASE, BLOOD: Lipase: 19 U/L (ref 11–59)

## 2014-11-22 MED ORDER — SODIUM CHLORIDE 0.9 % IV BOLUS (SEPSIS)
250.0000 mL | Freq: Once | INTRAVENOUS | Status: AC
  Administered 2014-11-22: 250 mL via INTRAVENOUS

## 2014-11-22 MED ORDER — ONDANSETRON HCL 4 MG/2ML IJ SOLN
4.0000 mg | Freq: Once | INTRAMUSCULAR | Status: AC
  Administered 2014-11-22: 4 mg via INTRAVENOUS
  Filled 2014-11-22: qty 2

## 2014-11-22 MED ORDER — SODIUM CHLORIDE 0.9 % IV SOLN
INTRAVENOUS | Status: DC
  Administered 2014-11-22: 17:00:00 via INTRAVENOUS

## 2014-11-22 MED ORDER — POTASSIUM CHLORIDE CRYS ER 20 MEQ PO TBCR
40.0000 meq | EXTENDED_RELEASE_TABLET | Freq: Once | ORAL | Status: AC
  Administered 2014-11-22: 40 meq via ORAL
  Filled 2014-11-22: qty 2

## 2014-11-22 MED ORDER — ONDANSETRON 4 MG PO TBDP: 4.0000 mg | ORAL_TABLET | Freq: Three times a day (TID) | ORAL | Status: DC | PRN

## 2014-11-22 MED ORDER — IOHEXOL 300 MG/ML  SOLN
100.0000 mL | Freq: Once | INTRAMUSCULAR | Status: AC | PRN
  Administered 2014-11-22: 100 mL via INTRAVENOUS

## 2014-11-22 MED ORDER — IOHEXOL 300 MG/ML  SOLN
50.0000 mL | Freq: Once | INTRAMUSCULAR | Status: AC | PRN
  Administered 2014-11-22: 50 mL via ORAL

## 2014-11-22 NOTE — ED Notes (Signed)
Spinal tap two weeks ago out [place] R/O meningitis. Lower back pain since then, worsening today. EMS reports patient on 3L/[place] upon their arrival and oxygen stats were 88%, Pt on NRB with stats 100% upon arrival to ED. Per EMS patients son states that when pt was at [facility] they "threw out her pain medication, Hydrocodone".

## 2014-11-22 NOTE — ED Provider Notes (Signed)
CSN: [id number]     Arrival date & time 11/22/14  1603 History   First MD Initiated Contact with Patient 11/22/14 1610     Chief Complaint  Patient presents with  . Back Pain     (Consider location/radiation/quality/duration/timing/severity/associated sxs/prior Treatment) Patient is a 80 y.o. female presenting with back pain. The history is provided by the patient.  Back Pain Associated symptoms: abdominal pain   Associated symptoms: no chest pain, no dysuria, no fever and no headaches    patient brought in the family. Patient has a history of COPD but that was not the problem. Patient's had nausea vomiting and diarrhea were loose bowel movements. About 2-3 a day no appetite not wanting to drink fluids or eat any food. Some mild abdominal pain. Some back pain. Patient has a known history of an aneurysm which is abdominal and an inoperable. Patient without severe pain. Patient's primary care doctor is Dr. Juanetta GoslingHawkins.  Past Medical History  Diagnosis Date  . COPD (chronic obstructive pulmonary disease)   . Hypertension   . Anxiety   . Depression   . Arthritis   . AAA (abdominal aortic aneurysm)   . Neuropathy   . Leg swelling   . Oxygen dependent   . Myocardial infarction   . Pneumonia   . Stroke    Past Surgical History  Procedure Laterality Date  . Cholecystectomy    . Appendectomy     Family History  Problem Relation Age of Onset  . Diabetes Brother   . Cancer Brother     Throat cancer, Heart attack  . Heart disease Brother   . Heart attack Brother   . Cancer Father   . Cancer Sister    History  Substance Use Topics  . Smoking status: Former Smoker -- 50 years    Types: Cigarettes    Quit date: 12/31/2010  . Smokeless tobacco: Former NeurosurgeonUser    Quit date: 12/31/2010  . Alcohol Use: No   OB History    No data available     Review of Systems  Constitutional: Negative for fever.  HENT: Negative for congestion.   Eyes: Negative for visual disturbance.   Respiratory: Negative for shortness of breath.   Cardiovascular: Negative for chest pain.  Gastrointestinal: Positive for nausea, vomiting, abdominal pain and diarrhea.  Genitourinary: Negative for dysuria.  Musculoskeletal: Negative for back pain.  Skin: Negative for rash.  Neurological: Negative for headaches.  Hematological: Does not bruise/bleed easily.  Psychiatric/Behavioral: Negative for confusion.      Allergies  Nsaids; Other; and Tomato  Home Medications   Prior to Admission medications   Medication Sig Start Date End Date Taking? Authorizing Provider  albuterol (PROVENTIL) (2.5 MG/3ML) 0.083% nebulizer solution Take 2.5 mg by nebulization 3 (three) times daily.    Yes Historical Provider, MD  aspirin EC 81 MG tablet Take 81 mg by mouth daily.   Yes Historical Provider, MD  budesonide-formoterol (SYMBICORT) 160-4.5 MCG/ACT inhaler Inhale 2 puffs into the lungs 2 (two) times daily.    Yes Historical Provider, MD  citalopram (CELEXA) 20 MG tablet Take 20 mg by mouth at bedtime.    Yes Historical Provider, MD  diazepam (VALIUM) 10 MG tablet Take 10 mg by mouth 3 (three) times daily as needed for anxiety.   Yes Historical Provider, MD  gabapentin (NEURONTIN) 100 MG tablet Take 100 mg by mouth 5 (five) times daily.    Yes Historical Provider, MD  HYDROcodone-acetaminophen (NORCO) 7.5-325 MG per tablet Take  1 tablet by mouth every 6 (six) hours as needed for moderate pain. Patient taking differently: Take 1 tablet by mouth at bedtime.  09/05/14  Yes Gerhard Munch, MD  tiotropium (SPIRIVA) 18 MCG inhalation capsule Place 18 mcg into inhaler and inhale every evening.    Yes Historical Provider, MD  torsemide (DEMADEX) 20 MG tablet Take 20 mg by mouth every morning.    Yes Historical Provider, MD  ondansetron (ZOFRAN ODT) 4 MG disintegrating tablet Take 1 tablet (4 mg total) by mouth every 8 (eight) hours as needed. 11/22/14   [name], MD   BP 126/74 mmHg  Pulse 65   Temp(Src) 97.6 F (36.4 C) (Oral)  Resp 16  Ht 5\' 1"  (1.549 m)  Wt 146 lb (66.225 kg)  BMI 27.60 kg/m2  SpO2 95% Physical Exam  Constitutional: She is oriented to person, place, and time. She appears well-developed and well-nourished. No distress.  HENT:  Head: Normocephalic and atraumatic.  Mouth/Throat: Oropharynx is clear and moist.  Eyes: Conjunctivae and EOM are normal. Pupils are equal, round, and reactive to light.  Neck: Normal range of motion.  Cardiovascular: Normal rate, regular rhythm and normal heart sounds.   No murmur heard. Pulmonary/Chest: Effort normal and breath sounds normal. No respiratory distress.  Abdominal: Soft. Bowel sounds are normal. There is no tenderness.  Musculoskeletal: Normal range of motion.  Neurological: She is alert and oriented to person, place, and time. No cranial nerve deficit. She exhibits normal muscle tone. Coordination normal.  Skin: Skin is warm. No rash noted.  Nursing note and vitals reviewed.   ED Course  Procedures (including critical care time) Labs Review Labs Reviewed  COMPREHENSIVE METABOLIC PANEL - Abnormal; Notable for the following:    Sodium 135 (*)    Potassium 3.3 (*)    Chloride 93 (*)    CO2 34 (*)    Albumin 2.7 (*)    GFR calc non Af Amer 62 (*)    GFR calc Af Amer 72 (*)    All other components within normal limits  CBC WITH DIFFERENTIAL - Abnormal; Notable for the following:    RBC 3.49 (*)    Hemoglobin 10.8 (*)    HCT 32.3 (*)    All other components within normal limits  PROTIME-INR - Abnormal; Notable for the following:    Prothrombin Time 15.8 (*)    All other components within normal limits  LIPASE, BLOOD  URINALYSIS, ROUTINE W REFLEX MICROSCOPIC   Results for orders placed or performed during the hospital encounter of 11/22/14  Comprehensive metabolic panel  Result Value Ref Range   Sodium 135 (L) 137 - 147 mEq/L   Potassium 3.3 (L) 3.7 - 5.3 mEq/L   Chloride 93 (L) 96 - 112 mEq/L   CO2 34  (H) 19 - 32 mEq/L   Glucose, Bld 97 70 - 99 mg/dL   BUN 15 6 - 23 mg/dL   Creatinine, Ser 0.14 0.50 - 1.10 mg/dL   Calcium 9.1 8.4 - 10.3 mg/dL   Total Protein 6.5 6.0 - 8.3 g/dL   Albumin 2.7 (L) 3.5 - 5.2 g/dL   AST 13 0 - 37 U/L   ALT 11 0 - 35 U/L   Alkaline Phosphatase 70 39 - 117 U/L   Total Bilirubin 0.3 0.3 - 1.2 mg/dL   GFR calc non Af Amer 62 (L) >90 mL/min   GFR calc Af Amer 72 (L) >90 mL/min   Anion gap 8 5 - 15  Lipase, blood  Result Value Ref Range   Lipase 19 11 - 59 U/L  Urinalysis, Routine w reflex microscopic  Result Value Ref Range   Color, Urine YELLOW YELLOW   APPearance CLEAR CLEAR   Specific Gravity, Urine 1.015 1.005 - 1.030   pH 5.0 5.0 - 8.0   Glucose, UA NEGATIVE NEGATIVE mg/dL   Hgb urine dipstick NEGATIVE NEGATIVE   Bilirubin Urine NEGATIVE NEGATIVE   Ketones, ur NEGATIVE NEGATIVE mg/dL   Protein, ur NEGATIVE NEGATIVE mg/dL   Urobilinogen, UA 0.2 0.0 - 1.0 mg/dL   Nitrite NEGATIVE NEGATIVE   Leukocytes, UA NEGATIVE NEGATIVE  CBC with Differential  Result Value Ref Range   WBC 5.3 4.0 - 10.5 K/uL   RBC 3.49 (L) 3.87 - 5.11 MIL/uL   Hemoglobin 10.8 (L) 12.0 - 15.0 g/dL   HCT 16.1 (L) 09.6 - 04.5 %   MCV 92.6 78.0 - 100.0 fL   MCH 30.9 26.0 - 34.0 pg   MCHC 33.4 30.0 - 36.0 g/dL   RDW 40.9 81.1 - 91.4 %   Platelets 357 150 - 400 K/uL   Neutrophils Relative % 64 43 - 77 %   Lymphocytes Relative 23 12 - 46 %   Monocytes Relative 11 3 - 12 %   Eosinophils Relative 2 0 - 5 %   Basophils Relative 0 0 - 1 %   Neutro Abs 3.4 1.7 - 7.7 K/uL   Lymphs Abs 1.2 0.7 - 4.0 K/uL   Monocytes Absolute 0.6 0.1 - 1.0 K/uL   Eosinophils Absolute 0.1 0.0 - 0.7 K/uL   Basophils Absolute 0.0 0.0 - 0.1 K/uL   WBC Morphology WHITE COUNT CONFIRMED ON SMEAR    Smear Review PLATELET COUNT CONFIRMED BY SMEAR   Protime-INR  Result Value Ref Range   Prothrombin Time 15.8 (H) 11.6 - 15.2 seconds   INR 1.25 0.00 - 1.49   Patient will be discharged home with Zofran.  Patient feeling better after fluids and antinausea medicine. Patient will be started on liquid diet and advance to a bland diet. Patient will return for any new or worse symptoms.  Imaging Review Ct Chest W Contrast  11/22/2014   CLINICAL DATA:  Progressive back pain. Vomiting. Known abdominal aortic aneurysm. Known thoracic aortic aneurysm.  EXAM: CT CHEST, ABDOMEN, AND PELVIS WITH CONTRAST  TECHNIQUE: Multidetector CT imaging of the chest, abdomen and pelvis was performed following the standard protocol during bolus administration of intravenous contrast.  CONTRAST:  50mL OMNIPAQUE IOHEXOL 300 MG/ML SOLN, OMNIPAQUE IOHEXOL 300 MG/ML SOLN  COMPARISON:  CT scans dated 11/11/2014  FINDINGS: CT CHEST FINDINGS  There is a stable 4.5 cm aneurysm of the ascending thoracic aorta. There is stable extensive coronary artery calcification. Heart size is within normal limits.  There are areas of scarring and atelectasis in both lungs, unchanged. There is a small spiculated area in the right upper lobe adjacent to the mediastinum anteriorly which is most likely inflammatory in origin.  No acute osseous abnormality.  CT ABDOMEN AND PELVIS FINDINGS  There is a stable 6.9 x 6.7 cm abdominal aortic aneurysm. Liver, spleen, pancreas, and right adrenal gland are normal. There is a stable 18 mm nodule in the left adrenal gland. Kidneys are normal. Bowel is normal. Uterus and ovaries and bladder are normal.  The patient has severe spinal stenosis at L3-4. There is moderate diffuse facet arthritis in the lumbar spine with severe facet arthritis at L3-4. These findings are unchanged.  No  free air or free fluid.  IMPRESSION: 1. No acute abnormality in the chest. Inflammatory changes or postinflammatory changes bilaterally. 2. Stable 6.9 cm abdominal aortic aneurysm. No acute abnormalities of the abdomen. 3. Severe spinal stenosis at L3-4, stable.   Electronically Signed   By: Geanie Cooley M.D.   On: 11/22/2014 20:29   Ct  Abdomen Pelvis W Contrast  11/22/2014   CLINICAL DATA:  Progressive back pain. Vomiting. Known abdominal aortic aneurysm. Known thoracic aortic aneurysm.  EXAM: CT CHEST, ABDOMEN, AND PELVIS WITH CONTRAST  TECHNIQUE: Multidetector CT imaging of the chest, abdomen and pelvis was performed following the standard protocol during bolus administration of intravenous contrast.  CONTRAST:  50mL OMNIPAQUE IOHEXOL 300 MG/ML SOLN, OMNIPAQUE IOHEXOL 300 MG/ML SOLN  COMPARISON:  CT scans dated 11/11/2014  FINDINGS: CT CHEST FINDINGS  There is a stable 4.5 cm aneurysm of the ascending thoracic aorta. There is stable extensive coronary artery calcification. Heart size is within normal limits.  There are areas of scarring and atelectasis in both lungs, unchanged. There is a small spiculated area in the right upper lobe adjacent to the mediastinum anteriorly which is most likely inflammatory in origin.  No acute osseous abnormality.  CT ABDOMEN AND PELVIS FINDINGS  There is a stable 6.9 x 6.7 cm abdominal aortic aneurysm. Liver, spleen, pancreas, and right adrenal gland are normal. There is a stable 18 mm nodule in the left adrenal gland. Kidneys are normal. Bowel is normal. Uterus and ovaries and bladder are normal.  The patient has severe spinal stenosis at L3-4. There is moderate diffuse facet arthritis in the lumbar spine with severe facet arthritis at L3-4. These findings are unchanged.  No free air or free fluid.  IMPRESSION: 1. No acute abnormality in the chest. Inflammatory changes or postinflammatory changes bilaterally. 2. Stable 6.9 cm abdominal aortic aneurysm. No acute abnormalities of the abdomen. 3. Severe spinal stenosis at L3-4, stable.   Electronically Signed   By: Geanie Cooley M.D.   On: 11/22/2014 20:29   Dg Abd Acute W/chest  11/22/2014   CLINICAL DATA:  Low back pain, difficulty walking  EXAM: ACUTE ABDOMEN SERIES (ABDOMEN 2 VIEW & CHEST 1 VIEW)  COMPARISON:  CT chest abdomen pelvis of 11/11/2014,  portable chest x-ray of 11/11/2014 and chest x-ray of 09/05/2014  FINDINGS: Linear scarring and/or atelectasis is noted at both lung bases. Early pneumonia at the left lung base cannot be excluded. No effusion is seen. Moderate cardiomegaly is stable.  Supine erect views the abdomen show slight gaseous distention of small bowel. No colonic distention is seen. This could represent and early small-bowel obstruction or possibly be due to ileus. Followup plain films are recommended. No free air is seen on the erect view. Degenerative changes present in the lower lumbar spine.  IMPRESSION: 1. Cardiomegaly. Cannot exclude pneumonia at the left lung base. Recommend followup two-view chest x-ray. 2. Some gaseous distention of small bowel loops. Consider ileus versus early partial small bowel obstruction. Recommend followup plain films or CT abdomen pelvis if warranted clinically. 3. No free air.   Electronically Signed   By: Dwyane Dee M.D.   On: 11/22/2014 17:20     EKG Interpretation None      MDM   Final diagnoses:  Back pain  Vomiting  Hypokalemia    Patient with mild hypokalemia. Patient with nausea and vomiting. Patient with bad COPD. But as the reason for the visit. Workup here without any significant findings including CT chest  CT abdomen pelvis. Only some mild hypokalemia. Patient given some oral potassium here. Renal functions normal. No evidence of pneumonia no evidence of bowel obstruction. Patient satting fine on her usual amount of oxygen.   Patient improved here patient we discharged home on Zofran. Patient feels better after fluids and antinausea medicine.     [name], MD 11/22/14 2232

## 2014-11-22 NOTE — ED Notes (Signed)
Patient given female urinal tolerated well.

## 2014-11-22 NOTE — Discharge Instructions (Signed)
As we discussed take the Zofran to control the nausea. Return for any new or worse symptoms. Recommend liquids with sugar than bland diet. Make an appointment to follow-up with Dr. Juanetta Gosling. Continue usual oxygen for your COPD.

## 2014-11-22 NOTE — ED Notes (Signed)
Pt tolerated 240cc of water

## 2014-11-24 NOTE — Discharge Summary (Signed)
Physician Discharge Summary  Patient ID: Melanie Lowe MRN: [id number] DOB/AGE: October 31, 1924 80 y.o. Primary Care Physician:Ritta Hammes L, MD Admit date: 11/11/2014 Discharge date: 11/24/2014    Discharge Diagnoses:   Principal Problem:   Acute encephalopathy Active Problems:   Essential hypertension   Abdominal aortic aneurysm   Chronic respiratory failure with hypoxia   Generalized weakness   Hypokalemia   Bradycardia     Medication List    STOP taking these medications        levofloxacin 500 MG tablet  Commonly known as:  LEVAQUIN     ondansetron 4 MG disintegrating tablet  Commonly known as:  ZOFRAN ODT      TAKE these medications        albuterol (2.5 MG/3ML) 0.083% nebulizer solution  Commonly known as:  PROVENTIL  Take 2.5 mg by nebulization 3 (three) times daily.     aspirin EC 81 MG tablet  Take 81 mg by mouth daily.     budesonide-formoterol 160-4.5 MCG/ACT inhaler  Commonly known as:  SYMBICORT  Inhale 2 puffs into the lungs 2 (two) times daily.     citalopram 20 MG tablet  Commonly known as:  CELEXA  Take 20 mg by mouth at bedtime.     diazepam 10 MG tablet  Commonly known as:  VALIUM  Take 10 mg by mouth 3 (three) times daily as needed for anxiety.     gabapentin 100 MG tablet  Commonly known as:  NEURONTIN  Take 100 mg by mouth 5 (five) times daily.     HYDROcodone-acetaminophen 7.5-325 MG per tablet  Commonly known as:  NORCO  Take 1 tablet by mouth every 6 (six) hours as needed for moderate pain.     tiotropium 18 MCG inhalation capsule  Commonly known as:  SPIRIVA  Place 18 mcg into inhaler and inhale every evening.     torsemide 20 MG tablet  Commonly known as:  DEMADEX  Take 20 mg by mouth every morning.        Discharged Condition: Improved    Consults: None  Significant Diagnostic Studies: Ct Head Wo Contrast  11/11/2014   CLINICAL DATA:  Altered mental status. Decreased level of consciousness.  EXAM: CT HEAD  WITHOUT CONTRAST  CT CERVICAL SPINE WITHOUT CONTRAST  TECHNIQUE: Multidetector CT imaging of the head and cervical spine was performed following the standard protocol without intravenous contrast. Multiplanar CT image reconstructions of the cervical spine were also generated.  COMPARISON:  11/06/2014  FINDINGS: CT HEAD FINDINGS  Skull and Sinuses:Chronic small right mastoid effusion or mucosal thickening. Mild ethmoid mucosal thickening.  No acute fracture destructive process.  Orbits: No acute abnormality.  Brain: No evidence of acute abnormality, such as acute infarction, hemorrhage, hydrocephalus, or mass lesion/mass effect. Stable pattern of chronic small-vessel ischemic injury with discrete small-vessel insult in the right anterior putamen and anterior limb internal capsule. Cerebral volume loss which is normal for age.  CT CERVICAL SPINE FINDINGS  There is reversal of cervical lordosis and slight C4-5 anterolisthesis which is chronic based on previous imaging. No acute fracture. No gross cervical canal hematoma or prevertebral edema. Focally advanced degenerative disc disease at C5-6 and C6-7 with bilateral uncovertebral spurring causing foraminal stenosis. Hemangioma present in the T2 right pedicle.  Biapical centrilobular emphysema. Mucus noted in the trachea. Tortuous great vessels at the thoracic inlet  IMPRESSION: 1. No acute intracranial or cervical spine findings. 2. Chronic and degenerative changes are noted above.   Electronically Signed  By: Tiburcio Pea M.D.   On: 11/11/2014 13:58   Ct Chest W Contrast  11/22/2014   CLINICAL DATA:  Progressive back pain. Vomiting. Known abdominal aortic aneurysm. Known thoracic aortic aneurysm.  EXAM: CT CHEST, ABDOMEN, AND PELVIS WITH CONTRAST  TECHNIQUE: Multidetector CT imaging of the chest, abdomen and pelvis was performed following the standard protocol during bolus administration of intravenous contrast.  CONTRAST:  50mL OMNIPAQUE IOHEXOL 300 MG/ML  SOLN, OMNIPAQUE IOHEXOL 300 MG/ML SOLN  COMPARISON:  CT scans dated 11/11/2014  FINDINGS: CT CHEST FINDINGS  There is a stable 4.5 cm aneurysm of the ascending thoracic aorta. There is stable extensive coronary artery calcification. Heart size is within normal limits.  There are areas of scarring and atelectasis in both lungs, unchanged. There is a small spiculated area in the right upper lobe adjacent to the mediastinum anteriorly which is most likely inflammatory in origin.  No acute osseous abnormality.  CT ABDOMEN AND PELVIS FINDINGS  There is a stable 6.9 x 6.7 cm abdominal aortic aneurysm. Liver, spleen, pancreas, and right adrenal gland are normal. There is a stable 18 mm nodule in the left adrenal gland. Kidneys are normal. Bowel is normal. Uterus and ovaries and bladder are normal.  The patient has severe spinal stenosis at L3-4. There is moderate diffuse facet arthritis in the lumbar spine with severe facet arthritis at L3-4. These findings are unchanged.  No free air or free fluid.  IMPRESSION: 1. No acute abnormality in the chest. Inflammatory changes or postinflammatory changes bilaterally. 2. Stable 6.9 cm abdominal aortic aneurysm. No acute abnormalities of the abdomen. 3. Severe spinal stenosis at L3-4, stable.   Electronically Signed   By: Geanie Cooley M.D.   On: 11/22/2014 20:29   Ct Cervical Spine Wo Contrast  11/11/2014   CLINICAL DATA:  Altered mental status with concern for fall.  Pain  EXAM: CT HEAD WITHOUT CONTRAST  CT CERVICAL SPINE WITHOUT CONTRAST  TECHNIQUE: Multidetector CT imaging of the head and cervical spine was performed following the standard protocol without intravenous contrast. Multiplanar CT image reconstructions of the cervical spine were also generated.  COMPARISON:  CT head November 06, 2014; CT cervical spine September 15, 2014  FINDINGS: CT HEAD FINDINGS  There is mild diffuse atrophy. There is no intracranial mass, hemorrhage, extra-axial fluid collection, or  midline shift. There is a prior focal infarct involving the right putamen. There is patchy small vessel disease in the centra semiovale bilaterally, stable. There is no new gray-white compartment lesion. No demonstrable acute infarct. The bony calvarium appears intact. The mastoid air cells are clear.  CT CERVICAL SPINE FINDINGS  There is no fracture or spondylolisthesis. Prevertebral soft tissues and predental space regions are normal. There is moderately severe disc space narrowing at C5-6 and C6-7, stable. There is no new disc space narrowing. There is facet hypertrophy at multiple levels bilaterally. There is diffuse disc protrusion and bony hypertrophy at C5-6 and C6-7 bilaterally. There is rather marked exit foraminal narrowing at C5-6 and C6-7 bilaterally with impression on the exiting nerve roots at these levels bilaterally. No frank disc extrusion or stenosis. There are scattered foci of carotid artery calcification bilaterally. There is chronic reversal of lordotic curvature.  IMPRESSION: CT head: Atrophy with patchy periventricular small vessel disease. Prior infarct right putamen. No acute infarct apparent. No intracranial mass, hemorrhage, or acute appearing infarct.  CT cervical spine: Multilevel osteoarthritic change. Note that there is marked exit foraminal narrowing with nerve root effacement  at C5-6 and C6-7 bilaterally due to bony hypertrophy and diffuse disc bulging. No frank disc extrusion or stenosis. No fracture or spondylolisthesis. Probable chronic muscle spasm given the chronic reversal of lordotic curvature. Bilateral carotid artery calcification.   Electronically Signed   By: Bretta Bang M.D.   On: 11/11/2014 13:49   Ct Abdomen Pelvis W Contrast  11/22/2014   CLINICAL DATA:  Progressive back pain. Vomiting. Known abdominal aortic aneurysm. Known thoracic aortic aneurysm.  EXAM: CT CHEST, ABDOMEN, AND PELVIS WITH CONTRAST  TECHNIQUE: Multidetector CT imaging of the chest, abdomen  and pelvis was performed following the standard protocol during bolus administration of intravenous contrast.  CONTRAST:  50mL OMNIPAQUE IOHEXOL 300 MG/ML SOLN, OMNIPAQUE IOHEXOL 300 MG/ML SOLN  COMPARISON:  CT scans dated 11/11/2014  FINDINGS: CT CHEST FINDINGS  There is a stable 4.5 cm aneurysm of the ascending thoracic aorta. There is stable extensive coronary artery calcification. Heart size is within normal limits.  There are areas of scarring and atelectasis in both lungs, unchanged. There is a small spiculated area in the right upper lobe adjacent to the mediastinum anteriorly which is most likely inflammatory in origin.  No acute osseous abnormality.  CT ABDOMEN AND PELVIS FINDINGS  There is a stable 6.9 x 6.7 cm abdominal aortic aneurysm. Liver, spleen, pancreas, and right adrenal gland are normal. There is a stable 18 mm nodule in the left adrenal gland. Kidneys are normal. Bowel is normal. Uterus and ovaries and bladder are normal.  The patient has severe spinal stenosis at L3-4. There is moderate diffuse facet arthritis in the lumbar spine with severe facet arthritis at L3-4. These findings are unchanged.  No free air or free fluid.  IMPRESSION: 1. No acute abnormality in the chest. Inflammatory changes or postinflammatory changes bilaterally. 2. Stable 6.9 cm abdominal aortic aneurysm. No acute abnormalities of the abdomen. 3. Severe spinal stenosis at L3-4, stable.   Electronically Signed   By: Geanie Cooley M.D.   On: 11/22/2014 20:29   Dg Chest Portable 1 View  11/11/2014   CLINICAL DATA:  Five day history of altered mental status. COPD. Hypertension  EXAM: PORTABLE CHEST - 1 VIEW  COMPARISON:  November 06, 2014 and January 24, 2014  FINDINGS: There is patchy atelectatic change in both mid and lower lung zones, stable. There is no frank edema or consolidation. Heart is mildly enlarged with pulmonary vascularity within normal limits. There is atherosclerotic change in aorta. Aorta is somewhat  tortuous. No adenopathy. Bones are osteoporotic.  IMPRESSION: Patchy atelectasis bilaterally, stable. No edema or consolidation. Stable cardiac prominence. Aortic prominence and tortuosity probably reflect chronic hypertensive change.   Electronically Signed   By: Bretta Bang M.D.   On: 11/11/2014 12:55   Dg Chest Port 1 View  10/26/2014   CLINICAL DATA:  Left-sided chest pain that radiates into the left arm and into the left flank. Dizziness and shortness of breath.  EXAM: PORTABLE CHEST - 1 VIEW  COMPARISON:  09/05/2014 and 01/24/2014 and chest CT dated 06/20/2013  FINDINGS: There is tortuosity and calcification of the thoracic aorta. Heart size and pulmonary vascularity are normal. Slight scarring at both lung bases. Chronic elevation of the left hemidiaphragm. No effusions or infiltrates. No acute osseous abnormality. Chronic degenerative changes of both shoulders.  IMPRESSION: No acute abnormalities.   Electronically Signed   By: Geanie Cooley M.D.   On: 10/26/2014 14:37   Dg Abd Acute W/chest  11/22/2014   CLINICAL DATA:  Low back pain, difficulty walking  EXAM: ACUTE ABDOMEN SERIES (ABDOMEN 2 VIEW & CHEST 1 VIEW)  COMPARISON:  CT chest abdomen pelvis of 11/11/2014, portable chest x-ray of 11/11/2014 and chest x-ray of 09/05/2014  FINDINGS: Linear scarring and/or atelectasis is noted at both lung bases. Early pneumonia at the left lung base cannot be excluded. No effusion is seen. Moderate cardiomegaly is stable.  Supine erect views the abdomen show slight gaseous distention of small bowel. No colonic distention is seen. This could represent and early small-bowel obstruction or possibly be due to ileus. Followup plain films are recommended. No free air is seen on the erect view. Degenerative changes present in the lower lumbar spine.  IMPRESSION: 1. Cardiomegaly. Cannot exclude pneumonia at the left lung base. Recommend followup two-view chest x-ray. 2. Some gaseous distention of small bowel  loops. Consider ileus versus early partial small bowel obstruction. Recommend followup plain films or CT abdomen pelvis if warranted clinically. 3. No free air.   Electronically Signed   By: Dwyane Dee M.D.   On: 11/22/2014 17:20   Ct Angio Chest Aorta W/cm &/or Wo/cm  11/11/2014   ADDENDUM REPORT: 11/11/2014 15:53  ADDENDUM: Comparison prior studies chest CT 06/20/2013 and abdominal CT on 03/25/2012: The thoracic aortic aneurysm is stable. The abdominal aortic aneurysm has increased in size (previously 5.7 x 5.9 cm. )  Critical Value/emergent results were called by telephone at the time of interpretation on 11/11/2014 at 3:50 pm to Dr. [name] , who verbally acknowledged these results.   Electronically Signed   By: Rosalie Gums M.D.   On: 11/11/2014 15:53   11/11/2014   CLINICAL DATA:  EMS called out for weakness, decreased LOC, patient being treated for UTI currently. Pt has hx of myocardial infarction, stroke, HTN, AAA. Pt complains of abdominal pain and back pain but did not specify site.  EXAM: CT ANGIOGRAPHY CHEST, ABDOMEN AND PELVIS  TECHNIQUE: Multidetector CT imaging through the chest, abdomen and pelvis was performed using the standard protocol during bolus administration of intravenous contrast. Multiplanar reconstructed images and MIPs were obtained and reviewed to evaluate the vascular anatomy.  CONTRAST:  OMNIPAQUE IOHEXOL 350 MG/ML SOLN  COMPARISON:  CT chest 06/20/2013  FINDINGS: CTA CHEST FINDINGS  Heart: Heart size is normal. There is significant coronary artery calcification no pericardial effusion.  Vascular structures: Normal arch anatomy. There is significant atherosclerotic calcification of the thoracic aorta. Ascending aortic aneurysm is 4.5 cm. The pulmonary arteries appear grossly well opacified.  Mediastinum/thyroid: Small, nonspecific mediastinal are present.  Lungs/Airways: There are emphysematous changes throughout the lungs. Areas of scarring/ atelectasis are  identified in the lung bases bilaterally. No focal consolidations or pleural effusions are identified. No suspicious nodules. Probable mucous is identified at the level of the thoracic inlet within the trachea. Otherwise the airways appear patent.  Chest wall/osseous structures: No suspicious lytic or blastic lesions are identified. No acute thoracic fracture.  Review of the MIP images confirms the above findings.  CTA ABDOMEN AND PELVIS FINDINGS  Vasculature: There is an infrarenal abdominal aortic aneurysm measuring 6.8 x 7.0 cm. Significant mural thrombus is present. There is significant atherosclerotic calcification of the origin of the superior mesenteric artery approximately 50% narrowing of origin. There is dense atherosclerotic calcification of the celiac axis without significant narrowing of the origin. Bilateral single renal arteries with significant atherosclerotic calcification at the origins. Normally opacified inferior mesenteric artery with calcification at the origin. The iliac arteries are tortuous. Right iliac artery  is mildly aneurysmal, measuring 1.5 cm. There is no evidence for abdominal aortic dissection.  Upper abdomen: There is focal fatty infiltration adjacent to the falciform ligament of the liver. No focal abnormality within the spleen, pancreas, or right adrenal gland. A low-attenuation lesion within the left adrenal gland measures 1.8 x 1.7 cm. Based on density measurements this is likely a benign process. Kidney show normal bilateral excretion without focal mass.  Gastrointestinal tract: The stomach and small bowel loops are normal in appearance. Numerous colonic diverticula. No acute diverticulitis. No evidence for acute appendicitis.  Pelvis: The uterus is present. No adnexal mass or free pelvic fluid.  Retroperitoneum: No retroperitoneal or mesenteric adenopathy.  Abdominal wall: Unremarkable.  Osseous structures: There is significant degenerative change throughout the in lumbar  spine. There is 6 mm of anterolisthesis of L3 on L4. Significant disc height loss and disc protrusion at L4-5. There is 3 mm of retrolisthesis of L1 on L2, associated with disc protrusion. No acute lumbar fracture.  Review of the MIP images confirms the above findings.  IMPRESSION: 1. No evidence for aortic dissection. 2. Ascending aortic aneurysm is 4.5 cm. 3. Large infrarenal abdominal aortic aneurysm is 7.0 cm with significant mural thrombus. 4. Significant atherosclerotic calcification of the abdominal vessels. 5. Left adrenal nodule 1.8 cm is favored to be benign based on density measurements.  Electronically Signed: By: Rosalie Gums M.D. On: 11/11/2014 15:47   Ct Cta Abd/pel W/cm &/or W/o Cm  11/11/2014   ADDENDUM REPORT: 11/11/2014 15:53  ADDENDUM: Comparison prior studies chest CT 06/20/2013 and abdominal CT on 03/25/2012: The thoracic aortic aneurysm is stable. The abdominal aortic aneurysm has increased in size (previously 5.7 x 5.9 cm. )  Critical Value/emergent results were called by telephone at the time of interpretation on 11/11/2014 at 3:50 pm to Dr. [name] , who verbally acknowledged these results.   Electronically Signed   By: Rosalie Gums M.D.   On: 11/11/2014 15:53   11/11/2014   CLINICAL DATA:  EMS called out for weakness, decreased LOC, patient being treated for UTI currently. Pt has hx of myocardial infarction, stroke, HTN, AAA. Pt complains of abdominal pain and back pain but did not specify site.  EXAM: CT ANGIOGRAPHY CHEST, ABDOMEN AND PELVIS  TECHNIQUE: Multidetector CT imaging through the chest, abdomen and pelvis was performed using the standard protocol during bolus administration of intravenous contrast. Multiplanar reconstructed images and MIPs were obtained and reviewed to evaluate the vascular anatomy.  CONTRAST:  OMNIPAQUE IOHEXOL 350 MG/ML SOLN  COMPARISON:  CT chest 06/20/2013  FINDINGS: CTA CHEST FINDINGS  Heart: Heart size is normal. There is significant  coronary artery calcification no pericardial effusion.  Vascular structures: Normal arch anatomy. There is significant atherosclerotic calcification of the thoracic aorta. Ascending aortic aneurysm is 4.5 cm. The pulmonary arteries appear grossly well opacified.  Mediastinum/thyroid: Small, nonspecific mediastinal are present.  Lungs/Airways: There are emphysematous changes throughout the lungs. Areas of scarring/ atelectasis are identified in the lung bases bilaterally. No focal consolidations or pleural effusions are identified. No suspicious nodules. Probable mucous is identified at the level of the thoracic inlet within the trachea. Otherwise the airways appear patent.  Chest wall/osseous structures: No suspicious lytic or blastic lesions are identified. No acute thoracic fracture.  Review of the MIP images confirms the above findings.  CTA ABDOMEN AND PELVIS FINDINGS  Vasculature: There is an infrarenal abdominal aortic aneurysm measuring 6.8 x 7.0 cm. Significant mural thrombus is present. There is significant  atherosclerotic calcification of the origin of the superior mesenteric artery approximately 50% narrowing of origin. There is dense atherosclerotic calcification of the celiac axis without significant narrowing of the origin. Bilateral single renal arteries with significant atherosclerotic calcification at the origins. Normally opacified inferior mesenteric artery with calcification at the origin. The iliac arteries are tortuous. Right iliac artery is mildly aneurysmal, measuring 1.5 cm. There is no evidence for abdominal aortic dissection.  Upper abdomen: There is focal fatty infiltration adjacent to the falciform ligament of the liver. No focal abnormality within the spleen, pancreas, or right adrenal gland. A low-attenuation lesion within the left adrenal gland measures 1.8 x 1.7 cm. Based on density measurements this is likely a benign process. Kidney show normal bilateral excretion without focal  mass.  Gastrointestinal tract: The stomach and small bowel loops are normal in appearance. Numerous colonic diverticula. No acute diverticulitis. No evidence for acute appendicitis.  Pelvis: The uterus is present. No adnexal mass or free pelvic fluid.  Retroperitoneum: No retroperitoneal or mesenteric adenopathy.  Abdominal wall: Unremarkable.  Osseous structures: There is significant degenerative change throughout the in lumbar spine. There is 6 mm of anterolisthesis of L3 on L4. Significant disc height loss and disc protrusion at L4-5. There is 3 mm of retrolisthesis of L1 on L2, associated with disc protrusion. No acute lumbar fracture.  Review of the MIP images confirms the above findings.  IMPRESSION: 1. No evidence for aortic dissection. 2. Ascending aortic aneurysm is 4.5 cm. 3. Large infrarenal abdominal aortic aneurysm is 7.0 cm with significant mural thrombus. 4. Significant atherosclerotic calcification of the abdominal vessels. 5. Left adrenal nodule 1.8 cm is favored to be benign based on density measurements.  Electronically Signed: By: Rosalie Gums M.D. On: 11/11/2014 15:47    Lab Results: Basic Metabolic Panel:  Recent Labs  16/10/96 1642  NA 135*  K 3.3*  CL 93*  CO2 34*  GLUCOSE 97  BUN 15  CREATININE 0.82  CALCIUM 9.1   Liver Function Tests:  Recent Labs  11/22/14 1642  AST 13  ALT 11  ALKPHOS 70  BILITOT 0.3  PROT 6.5  ALBUMIN 2.7*     CBC:  Recent Labs  11/22/14 1642  WBC 5.3  NEUTROABS 3.4  HGB 10.8*  HCT 32.3*  MCV 92.6  PLT 357    No results found for this or any previous visit (from the past 240 hour(s)).   Hospital Course: This is an 80 year old came to the hospital because of weakness and altered mental status. She was found to be very weak appear to be somewhat dehydrated but her mental status to some extent was improved by the time she was admitted. She continued to be weak throughout her hospitalization she was treated with IV fluids and  improved. By the time of discharge she was back to her baseline. She is very weak at baseline has periods of confusion and she has a very large aortic aneurysm for which she is not a surgical candidate. Hospice care was discussed with the patient and her family and they do not want to go through with that at this point  Discharge Exam: Blood pressure 107/47, pulse 113, temperature 97.5 F (36.4 C), temperature source Oral, resp. rate 20, height 5\' 1"  (1.549 m), weight 52 kg (114 lb 10.2 oz), SpO2 91 %. She is awake and alert. She is still somewhat weak. Her chest is clear  Disposition: Home with home health services      Discharge Instructions  Discharge patient    Complete by:  As directed      Discharge patient    Complete by:  As directed      Face-to-face encounter (required for Medicare/Medicaid patients)    Complete by:  As directed   I Bralon Antkowiak L certify that this patient is under my care and that I, or a nurse practitioner or physician's assistant working with me, had a face-to-face encounter that meets the physician face-to-face encounter requirements with this patient on 11/15/2014. The encounter with the patient was in whole, or in part for the following medical condition(s) which is the primary reason for home health care (List medical condition): altered mental status/weakness  The encounter with the patient was in whole, or in part, for the following medical condition, which is the primary reason for home health care:  altered mental status/weakness  I certify that, based on my findings, the following services are medically necessary home health services:   NursingPhysical therapy    Reason for Medically Necessary Home Health Services:  Skilled Nursing- Change/Decline in Patient Status  My clinical findings support the need for the above services:  Shortness of breath with activity  Further, I certify that my clinical findings support that this patient is homebound due  to:  Shortness of Breath with activity     Home Health    Complete by:  As directed   To provide the following care/treatments:   PTRN               Signed: Azar South L   11/24/2014, 7:53 AM

## 2015-04-11 ENCOUNTER — Encounter (HOSPITAL_COMMUNITY): Payer: Self-pay | Admitting: Emergency Medicine

## 2015-04-11 ENCOUNTER — Emergency Department (HOSPITAL_COMMUNITY): Payer: Medicare HMO

## 2015-04-11 ENCOUNTER — Observation Stay (HOSPITAL_COMMUNITY)
Admission: EM | Admit: 2015-04-11 | Discharge: 2015-04-13 | Disposition: A | Discharge status: Home / Self Care | Payer: Medicare HMO | Attending: Pulmonary Disease | Admitting: Pulmonary Disease

## 2015-04-11 DIAGNOSIS — G629 Polyneuropathy, unspecified: Secondary | ICD-10-CM | POA: Insufficient documentation

## 2015-04-11 DIAGNOSIS — F329 Major depressive disorder, single episode, unspecified: Secondary | ICD-10-CM | POA: Diagnosis not present

## 2015-04-11 DIAGNOSIS — I252 Old myocardial infarction: Secondary | ICD-10-CM | POA: Insufficient documentation

## 2015-04-11 DIAGNOSIS — J159 Unspecified bacterial pneumonia: Secondary | ICD-10-CM | POA: Diagnosis not present

## 2015-04-11 DIAGNOSIS — Z9981 Dependence on supplemental oxygen: Secondary | ICD-10-CM | POA: Insufficient documentation

## 2015-04-11 DIAGNOSIS — J449 Chronic obstructive pulmonary disease, unspecified: Secondary | ICD-10-CM | POA: Diagnosis present

## 2015-04-11 DIAGNOSIS — J441 Chronic obstructive pulmonary disease with (acute) exacerbation: Secondary | ICD-10-CM | POA: Insufficient documentation

## 2015-04-11 DIAGNOSIS — Z79899 Other long term (current) drug therapy: Secondary | ICD-10-CM | POA: Diagnosis not present

## 2015-04-11 DIAGNOSIS — Z7951 Long term (current) use of inhaled steroids: Secondary | ICD-10-CM | POA: Diagnosis not present

## 2015-04-11 DIAGNOSIS — Z8673 Personal history of transient ischemic attack (TIA), and cerebral infarction without residual deficits: Secondary | ICD-10-CM | POA: Diagnosis not present

## 2015-04-11 DIAGNOSIS — I10 Essential (primary) hypertension: Secondary | ICD-10-CM | POA: Diagnosis not present

## 2015-04-11 DIAGNOSIS — F419 Anxiety disorder, unspecified: Secondary | ICD-10-CM | POA: Diagnosis not present

## 2015-04-11 DIAGNOSIS — M545 Low back pain, unspecified: Secondary | ICD-10-CM | POA: Diagnosis present

## 2015-04-11 DIAGNOSIS — Z87891 Personal history of nicotine dependence: Secondary | ICD-10-CM | POA: Insufficient documentation

## 2015-04-11 DIAGNOSIS — Q676 Pectus excavatum: Secondary | ICD-10-CM | POA: Diagnosis not present

## 2015-04-11 DIAGNOSIS — Z7982 Long term (current) use of aspirin: Secondary | ICD-10-CM | POA: Insufficient documentation

## 2015-04-11 DIAGNOSIS — J9620 Acute and chronic respiratory failure, unspecified whether with hypoxia or hypercapnia: Secondary | ICD-10-CM | POA: Diagnosis present

## 2015-04-11 DIAGNOSIS — J189 Pneumonia, unspecified organism: Secondary | ICD-10-CM | POA: Diagnosis present

## 2015-04-11 DIAGNOSIS — J9611 Chronic respiratory failure with hypoxia: Secondary | ICD-10-CM | POA: Diagnosis present

## 2015-04-11 DIAGNOSIS — I719 Aortic aneurysm of unspecified site, without rupture: Secondary | ICD-10-CM | POA: Diagnosis present

## 2015-04-11 DIAGNOSIS — I714 Abdominal aortic aneurysm, without rupture, unspecified: Secondary | ICD-10-CM | POA: Diagnosis present

## 2015-04-11 DIAGNOSIS — R05 Cough: Secondary | ICD-10-CM | POA: Diagnosis present

## 2015-04-11 DIAGNOSIS — J440 Chronic obstructive pulmonary disease with acute lower respiratory infection: Secondary | ICD-10-CM

## 2015-04-11 DIAGNOSIS — R079 Chest pain, unspecified: Secondary | ICD-10-CM

## 2015-04-11 LAB — CBC WITH DIFFERENTIAL/PLATELET
Basophils Absolute: 0 10*3/uL (ref 0.0–0.1)
Basophils Relative: 0 % (ref 0–1)
Eosinophils Absolute: 0.2 10*3/uL (ref 0.0–0.7)
Eosinophils Relative: 3 % (ref 0–5)
HEMATOCRIT: 37.6 % (ref 36.0–46.0)
Hemoglobin: 12.7 g/dL (ref 12.0–15.0)
LYMPHS PCT: 18 % (ref 12–46)
Lymphs Abs: 1.4 10*3/uL (ref 0.7–4.0)
MCH: 31 pg (ref 26.0–34.0)
MCHC: 33.8 g/dL (ref 30.0–36.0)
MCV: 91.7 fL (ref 78.0–100.0)
MONOS PCT: 12 % (ref 3–12)
Monocytes Absolute: 0.9 10*3/uL (ref 0.1–1.0)
Neutro Abs: 5.2 10*3/uL (ref 1.7–7.7)
Neutrophils Relative %: 67 % (ref 43–77)
Platelets: 301 10*3/uL (ref 150–400)
RBC: 4.1 MIL/uL (ref 3.87–5.11)
RDW: 13 % (ref 11.5–15.5)
WBC: 7.7 10*3/uL (ref 4.0–10.5)

## 2015-04-11 LAB — COMPREHENSIVE METABOLIC PANEL
ALK PHOS: 74 U/L (ref 39–117)
ALT: 9 U/L (ref 0–35)
AST: 17 U/L (ref 0–37)
Albumin: 3.8 g/dL (ref 3.5–5.2)
Anion gap: 10 (ref 5–15)
BILIRUBIN TOTAL: 0.7 mg/dL (ref 0.3–1.2)
BUN: 16 mg/dL (ref 6–23)
CALCIUM: 9.4 mg/dL (ref 8.4–10.5)
CHLORIDE: 94 mmol/L — AB (ref 96–112)
CO2: 29 mmol/L (ref 19–32)
Creatinine, Ser: 1.11 mg/dL — ABNORMAL HIGH (ref 0.50–1.10)
GFR calc non Af Amer: 43 mL/min — ABNORMAL LOW (ref >90)
GFR, EST AFRICAN AMERICAN: 50 mL/min — AB (ref >90)
GLUCOSE: 109 mg/dL — AB (ref 70–99)
POTASSIUM: 4.3 mmol/L (ref 3.5–5.1)
SODIUM: 133 mmol/L — AB (ref 135–145)
Total Protein: 7.5 g/dL (ref 6.0–8.3)

## 2015-04-11 LAB — TROPONIN I

## 2015-04-11 LAB — BRAIN NATRIURETIC PEPTIDE: B NATRIURETIC PEPTIDE 5: 46 pg/mL (ref 0.0–100.0)

## 2015-04-11 NOTE — ED Provider Notes (Signed)
CSN: [id number]     Arrival date & time 04/11/15  2137 History  This chart was scribed for Benjiman Core, MD by Murriel Hopper, ED Scribe. This patient was seen in room APA11/AP[facility]11 and the patient's care was started at 10:10 PM.    Chief Complaint  Patient presents with  . Cough  . Chest Pain     The history is provided by the patient. No language interpreter was used.     HPI Comments: Melanie Lowe is a 81 y.o. female with COPD who presents to the Emergency Department complaining of intermittent central and left-sided chest pain with associated productive cough and intermittent fever that has been present for a few days. Pt states that she has been coughing up yellow sputum with her cough. Pt uses 3L of oxygen per day at home.     Past Medical History  Diagnosis Date  . COPD (chronic obstructive pulmonary disease)   . Hypertension   . Anxiety   . Depression   . Arthritis   . AAA (abdominal aortic aneurysm)   . Neuropathy   . Leg swelling   . Oxygen dependent   . Myocardial infarction   . Pneumonia   . Stroke    Past Surgical History  Procedure Laterality Date  . Cholecystectomy    . Appendectomy     Family History  Problem Relation Age of Onset  . Diabetes Brother   . Cancer Brother     Throat cancer, Heart attack  . Heart disease Brother   . Heart attack Brother   . Cancer Father   . Cancer Sister    History  Substance Use Topics  . Smoking status: Former Smoker -- 50 years    Types: Cigarettes    Quit date: 12/31/2010  . Smokeless tobacco: Former Neurosurgeon    Quit date: 12/31/2010  . Alcohol Use: No   OB History    No data available     Review of Systems  Constitutional: Positive for fever.  Respiratory: Positive for cough.   Cardiovascular: Positive for chest pain.  All other systems reviewed and are negative.     Allergies  Nsaids; Other; and Tomato  Home Medications   Prior to Admission medications   Medication Sig Start Date End Date  Taking? Authorizing Provider  acetaminophen (TYLENOL) 500 MG tablet Take 500 mg by mouth daily as needed for mild pain or moderate pain.   Yes Historical Provider, MD  albuterol (PROVENTIL) (2.5 MG/3ML) 0.083% nebulizer solution Take 2.5 mg by nebulization 3 (three) times daily.    Yes Historical Provider, MD  aspirin EC 81 MG tablet Take 81 mg by mouth daily.   Yes Historical Provider, MD  budesonide-formoterol (SYMBICORT) 160-4.5 MCG/ACT inhaler Inhale 2 puffs into the lungs 2 (two) times daily.    Yes Historical Provider, MD  citalopram (CELEXA) 20 MG tablet Take 20 mg by mouth at bedtime.    Yes Historical Provider, MD  diazepam (VALIUM) 10 MG tablet Take 10 mg by mouth 3 (three) times daily as needed for anxiety.   Yes Historical Provider, MD  gabapentin (NEURONTIN) 100 MG tablet Take 100 mg by mouth 5 (five) times daily.    Yes Historical Provider, MD  HYDROcodone-acetaminophen (NORCO) 7.5-325 MG per tablet Take 1 tablet by mouth every 6 (six) hours as needed for moderate pain. Patient taking differently: Take 1 tablet by mouth at bedtime.  09/05/14  Yes Gerhard Munch, MD  tiotropium (SPIRIVA) 18 MCG inhalation capsule Place  18 mcg into inhaler and inhale every evening.    Yes Historical Provider, MD  torsemide (DEMADEX) 20 MG tablet Take 20 mg by mouth every morning.    Yes Historical Provider, MD  ondansetron (ZOFRAN ODT) 4 MG disintegrating tablet Take 1 tablet (4 mg total) by mouth every 8 (eight) hours as needed. Patient not taking: Reported on 04/11/2015 11/22/14   [name], MD   BP 112/72 mmHg  Pulse 54  Temp(Src) 98.2 F (36.8 C) (Oral)  Resp 17  Ht  (1.549 m)  Wt 146 lb (66.225 kg)  BMI 27.60 kg/m2  SpO2 98% Physical Exam  Constitutional: She is oriented to person, place, and time. She appears well-developed and well-nourished.  HENT:  Head: Normocephalic and atraumatic.  Mild JVD  Cardiovascular: Normal rate.   Pulmonary/Chest: Effort normal.  Prolonged  expiration throughout Pectus excavatum   Abdominal: She exhibits no distension.  Musculoskeletal:  No edema in extremities, but engorged veins bilaterally  Neurological: She is alert and oriented to person, place, and time.  Skin: Skin is warm and dry.  Psychiatric: She has a normal mood and affect.  Nursing note and vitals reviewed.   ED Course  Procedures (including critical care time)  DIAGNOSTIC STUDIES: Oxygen Saturation is 96% on [place], normal by my interpretation.    COORDINATION OF CARE: 10:13 PM Discussed treatment plan with pt at bedside and pt agreed to plan.   Labs Review Labs Reviewed  COMPREHENSIVE METABOLIC PANEL - Abnormal; Notable for the following:    Sodium 133 (*)    Chloride 94 (*)    Glucose, Bld 109 (*)    Creatinine, Ser 1.11 (*)    GFR calc non Af Amer 43 (*)    GFR calc Af Amer 50 (*)    All other components within normal limits  CBC WITH DIFFERENTIAL/PLATELET  TROPONIN I  BRAIN NATRIURETIC PEPTIDE    Imaging Review Dg Chest 2 View  04/11/2015   CLINICAL DATA:  Midsternal chest pain radiating to the left inframammary region, productive cough and fever  EXAM: CHEST  2 VIEW  COMPARISON:  11/22/2014 chest CT and radiograph  FINDINGS: Patchy right middle lobe consolidation is identified. Linear left lower lobe scarring is stable. Heart size is mildly enlarged. Probable hiatal hernia. No significant pleural effusion. Rightward curvature of the lower thoracic spine is reidentified. No acute osseous abnormality.  IMPRESSION: Patchy right middle lobe consolidation superimposed on hyperinflation suggesting emphysema. This could represent superimposed pneumonia.   Electronically Signed   By: Christiana Pellant M.D.   On: 04/11/2015 22:30     EKG Interpretation   Date/Time:  Monday April 11 2015 22:05:34 EDT Ventricular Rate:  69 PR Interval:  216 QRS Duration: 87 QT Interval:  404 QTC Calculation: 433 R Axis:   48 Text Interpretation:  Sinus rhythm  Borderline prolonged PR interval  Baseline wander in lead(s) V5 Confirmed by Rubin Payor  MD, Harrold Donath [phone])  on 04/11/2015 10:18:06 PM      MDM   Final diagnoses:  CAP (community acquired pneumonia)  Chronic obstructive pulmonary disease, unspecified COPD, unspecified chronic bronchitis type    Patient with shortness of breath and cough. Is on chronic oxygen. Possible pneumonia on x-ray. Will admit to internal medicine. She has not been in the hospital recently.  I personally performed the services described in this documentation, which was scribed in my presence. The recorded information has been reviewed and is accurate.     Benjiman Core, MD 04/11/15 [phone]

## 2015-04-11 NOTE — ED Notes (Signed)
Patient complaining of chest pain radiating from middle of chest under left breast. Patient also complains of coughing up yellow sputum and fever.

## 2015-04-12 DIAGNOSIS — J189 Pneumonia, unspecified organism: Secondary | ICD-10-CM | POA: Diagnosis present

## 2015-04-12 DIAGNOSIS — I714 Abdominal aortic aneurysm, without rupture: Secondary | ICD-10-CM

## 2015-04-12 LAB — COMPREHENSIVE METABOLIC PANEL
ALK PHOS: 63 U/L (ref 39–117)
ALT: 7 U/L (ref 0–35)
ANION GAP: 5 (ref 5–15)
AST: 12 U/L (ref 0–37)
Albumin: 3 g/dL — ABNORMAL LOW (ref 3.5–5.2)
BUN: 17 mg/dL (ref 6–23)
CO2: 32 mmol/L (ref 19–32)
Calcium: 8.9 mg/dL (ref 8.4–10.5)
Chloride: 98 mmol/L (ref 96–112)
Creatinine, Ser: 1.02 mg/dL (ref 0.50–1.10)
GFR, EST AFRICAN AMERICAN: 55 mL/min — AB (ref >90)
GFR, EST NON AFRICAN AMERICAN: 47 mL/min — AB (ref >90)
GLUCOSE: 78 mg/dL (ref 70–99)
POTASSIUM: 3.8 mmol/L (ref 3.5–5.1)
SODIUM: 135 mmol/L (ref 135–145)
Total Bilirubin: 0.7 mg/dL (ref 0.3–1.2)
Total Protein: 6.3 g/dL (ref 6.0–8.3)

## 2015-04-12 LAB — CBC
HCT: 33.1 % — ABNORMAL LOW (ref 36.0–46.0)
Hemoglobin: 11.1 g/dL — ABNORMAL LOW (ref 12.0–15.0)
MCH: 31.2 pg (ref 26.0–34.0)
MCHC: 33.5 g/dL (ref 30.0–36.0)
MCV: 93 fL (ref 78.0–100.0)
PLATELETS: 254 10*3/uL (ref 150–400)
RBC: 3.56 MIL/uL — ABNORMAL LOW (ref 3.87–5.11)
RDW: 12.8 % (ref 11.5–15.5)
WBC: 6.3 10*3/uL (ref 4.0–10.5)

## 2015-04-12 MED ORDER — ENOXAPARIN SODIUM 30 MG/0.3ML ~~LOC~~ SOLN
30.0000 mg | SUBCUTANEOUS | Status: DC
  Administered 2015-04-12 – 2015-04-13 (×2): 30 mg via SUBCUTANEOUS
  Filled 2015-04-12 (×2): qty 0.3

## 2015-04-12 MED ORDER — DIAZEPAM 5 MG PO TABS
10.0000 mg | ORAL_TABLET | Freq: Three times a day (TID) | ORAL | Status: DC | PRN
Start: 2015-04-12 — End: 2015-04-13

## 2015-04-12 MED ORDER — ALBUTEROL SULFATE (2.5 MG/3ML) 0.083% IN NEBU: 2.5000 mg | INHALATION_SOLUTION | Freq: Four times a day (QID) | RESPIRATORY_TRACT | Status: DC

## 2015-04-12 MED ORDER — BUDESONIDE-FORMOTEROL FUMARATE 160-4.5 MCG/ACT IN AERO
2.0000 | INHALATION_SPRAY | Freq: Two times a day (BID) | RESPIRATORY_TRACT | Status: DC
  Administered 2015-04-12 – 2015-04-13 (×2): 2 via RESPIRATORY_TRACT
  Filled 2015-04-12: qty 6

## 2015-04-12 MED ORDER — ALBUTEROL SULFATE (2.5 MG/3ML) 0.083% IN NEBU: 2.5000 mg | INHALATION_SOLUTION | RESPIRATORY_TRACT | Status: DC | PRN

## 2015-04-12 MED ORDER — TORSEMIDE 20 MG PO TABS
20.0000 mg | ORAL_TABLET | Freq: Every morning | ORAL | Status: DC
  Administered 2015-04-12 – 2015-04-13 (×2): 20 mg via ORAL
  Filled 2015-04-12 (×2): qty 1

## 2015-04-12 MED ORDER — IPRATROPIUM-ALBUTEROL 0.5-2.5 (3) MG/3ML IN SOLN
3.0000 mL | Freq: Four times a day (QID) | RESPIRATORY_TRACT | Status: DC
  Administered 2015-04-12 – 2015-04-13 (×7): 3 mL via RESPIRATORY_TRACT
  Filled 2015-04-12 (×7): qty 3

## 2015-04-12 MED ORDER — LEVOFLOXACIN IN D5W 500 MG/100ML IV SOLN
500.0000 mg | INTRAVENOUS | Status: DC
Start: 2015-04-14 — End: 2015-04-13

## 2015-04-12 MED ORDER — LEVOFLOXACIN IN D5W 750 MG/150ML IV SOLN
750.0000 mg | Freq: Once | INTRAVENOUS | Status: AC
  Administered 2015-04-12: 750 mg via INTRAVENOUS
  Filled 2015-04-12: qty 150

## 2015-04-12 MED ORDER — GABAPENTIN 100 MG PO CAPS
100.0000 mg | ORAL_CAPSULE | Freq: Every day | ORAL | Status: DC
  Administered 2015-04-12 – 2015-04-13 (×8): 100 mg via ORAL
  Filled 2015-04-12 (×8): qty 1

## 2015-04-12 MED ORDER — CITALOPRAM HYDROBROMIDE 20 MG PO TABS
20.0000 mg | ORAL_TABLET | Freq: Every day | ORAL | Status: DC
  Administered 2015-04-12 (×2): 20 mg via ORAL
  Filled 2015-04-12 (×2): qty 1

## 2015-04-12 MED ORDER — DIAZEPAM 5 MG PO TABS: 10.0000 mg | ORAL_TABLET | Freq: Three times a day (TID) | ORAL | Status: DC | PRN

## 2015-04-12 MED ORDER — IPRATROPIUM BROMIDE 0.02 % IN SOLN: 0.5000 mg | Freq: Four times a day (QID) | RESPIRATORY_TRACT | Status: DC

## 2015-04-12 MED ORDER — GUAIFENESIN ER 600 MG PO TB12
600.0000 mg | ORAL_TABLET | Freq: Two times a day (BID) | ORAL | Status: DC
  Administered 2015-04-12 – 2015-04-13 (×4): 600 mg via ORAL
  Filled 2015-04-12 (×4): qty 1

## 2015-04-12 MED ORDER — GABAPENTIN 100 MG PO TABS: 100.0000 mg | ORAL_TABLET | Freq: Every day | ORAL | Status: DC

## 2015-04-12 MED ORDER — SODIUM CHLORIDE 0.9 % IV SOLN
INTRAVENOUS | Status: DC
  Administered 2015-04-12 (×2): via INTRAVENOUS
  Administered 2015-04-13: 1 mL via INTRAVENOUS

## 2015-04-12 MED ORDER — ONDANSETRON HCL 4 MG PO TABS: 4.0000 mg | ORAL_TABLET | Freq: Four times a day (QID) | ORAL | Status: DC | PRN

## 2015-04-12 MED ORDER — ONDANSETRON HCL 4 MG/2ML IJ SOLN: 4.0000 mg | Freq: Four times a day (QID) | INTRAMUSCULAR | Status: DC | PRN

## 2015-04-12 MED ORDER — HYDROCODONE-ACETAMINOPHEN 7.5-325 MG PO TABS
1.0000 | ORAL_TABLET | Freq: Four times a day (QID) | ORAL | Status: DC | PRN
  Administered 2015-04-12 (×2): 1 via ORAL
  Filled 2015-04-12 (×2): qty 1

## 2015-04-12 MED ORDER — ASPIRIN EC 81 MG PO TBEC
81.0000 mg | DELAYED_RELEASE_TABLET | Freq: Every day | ORAL | Status: DC
  Administered 2015-04-12 – 2015-04-13 (×2): 81 mg via ORAL
  Filled 2015-04-12 (×2): qty 1

## 2015-04-12 NOTE — Progress Notes (Signed)
ANTIBIOTIC CONSULT NOTE - INITIAL  Pharmacy Consult for levofloxacin Indication: rule out pneumonia  Allergies  Allergen Reactions  . Nsaids     Per patients son due to AAA.  Can only take coated asprin  . Other     FOOD: Spice Triggers acid reflux  . Tomato     Triggers acid reflux    Patient Measurements: Height:  (154.9 cm) Weight: 129 lb 6.4 oz (58.695 kg) IBW/kg (Calculated) : 47.8   Vital Signs: Temp: 98.4 F (36.9 C) (04/12 0050) Temp Source: Oral (04/12 0050) BP: 130/76 mmHg (04/12 0050) Pulse Rate: 50 (04/12 0050) Intake/Output from previous day:   Intake/Output from this shift:    Labs:  Recent Labs  04/11/15 2215  WBC 7.7  HGB 12.7  PLT 301  CREATININE 1.11*   Estimated Creatinine Clearance: 28.3 mL/min (by C-G formula based on Cr of 1.11). No results for input(s): VANCOTROUGH, VANCOPEAK, VANCORANDOM, GENTTROUGH, GENTPEAK, GENTRANDOM, TOBRATROUGH, TOBRAPEAK, TOBRARND, AMIKACINPEAK, AMIKACINTROU, AMIKACIN in the last 72 hours.   Microbiology: No results found for this or any previous visit (from the past 720 hour(s)).  Medical History: Past Medical History  Diagnosis Date  . COPD (chronic obstructive pulmonary disease)   . Hypertension   . Anxiety   . Depression   . Arthritis   . AAA (abdominal aortic aneurysm)   . Neuropathy   . Leg swelling   . Oxygen dependent   . Myocardial infarction   . Pneumonia   . Stroke     Medications:  Prescriptions prior to admission  Medication Sig Dispense Refill Last Dose  . acetaminophen (TYLENOL) 500 MG tablet Take 500 mg by mouth daily as needed for mild pain or moderate pain.   04/11/2015 at 2030  . albuterol (PROVENTIL) (2.5 MG/3ML) 0.083% nebulizer solution Take 2.5 mg by nebulization 3 (three) times daily.    04/11/2015 at Unknown time  . aspirin EC 81 MG tablet Take 81 mg by mouth daily.   04/11/2015 at Unknown time  . budesonide-formoterol (SYMBICORT) 160-4.5 MCG/ACT inhaler Inhale 2 puffs  into the lungs 2 (two) times daily.    04/11/2015 at Unknown time  . citalopram (CELEXA) 20 MG tablet Take 20 mg by mouth at bedtime.    04/10/2015 at Unknown time  . diazepam (VALIUM) 10 MG tablet Take 10 mg by mouth 3 (three) times daily as needed for anxiety.   Past Week at Unknown time  . gabapentin (NEURONTIN) 100 MG tablet Take 100 mg by mouth 5 (five) times daily.    04/11/2015 at Unknown time  . HYDROcodone-acetaminophen (NORCO) 7.5-325 MG per tablet Take 1 tablet by mouth every 6 (six) hours as needed for moderate pain. (Patient taking differently: Take 1 tablet by mouth at bedtime. ) 15 tablet 0 04/10/2015 at Unknown time  . tiotropium (SPIRIVA) 18 MCG inhalation capsule Place 18 mcg into inhaler and inhale every evening.    04/11/2015 at Unknown time  . torsemide (DEMADEX) 20 MG tablet Take 20 mg by mouth every morning.    04/11/2015 at Unknown time  . ondansetron (ZOFRAN ODT) 4 MG disintegrating tablet Take 1 tablet (4 mg total) by mouth every 8 (eight) hours as needed. (Patient not taking: Reported on 04/11/2015) 12 tablet 1    Assessment: 81 yo F admitted with difficulty breathing, CXR revealed likely PNA.  SCr 1.1 >> CrCl ~30 ml/min.  Goal of Therapy: Eradication of infection  Plan:  - Levofloxacin 750 mg IV x 1 then 500 mg  IV q48h - f/u renal function  Drusilla Kanner 04/12/2015,1:26 AM

## 2015-04-12 NOTE — Progress Notes (Signed)
Subjective: She was admitted with pneumonia. At baseline she has COPD. She says she feels a little bit better. She wants to get up.  Objective: Vital signs in last 24 hours: Temp:  [98.1 F (36.7 C)-98.4 F (36.9 C)] 98.1 F (36.7 C) (04/12 0619) Pulse Rate:  [50-70] 61 (04/12 0619) Resp:  [12-24] 16 (04/12 0619) BP: (101-132)/(66-76) 132/75 mmHg (04/12 0619) SpO2:  [95 %-100 %] 95 % (04/12 0717) Weight:  [58.695 kg (129 lb 6.4 oz)-66.225 kg (146 lb)] 58.695 kg (129 lb 6.4 oz) (04/12 0050) Weight change:  Last BM Date: 04/11/15  Intake/Output from previous day:    PHYSICAL EXAM General appearance: alert, cooperative and mild distress Resp: rhonchi Left greater than right Cardio: regular rate and rhythm, S1, S2 normal, no murmur, click, rub or gallop GI: soft, non-tender; bowel sounds normal; no masses,  no organomegaly Extremities: extremities normal, atraumatic, no cyanosis or edema  Lab Results:  Results for orders placed or performed during the hospital encounter of 04/11/15 (from the past 48 hour(s))  CBC with Differential     Status: None   Collection Time: 04/11/15 10:15 PM  Result Value Ref Range   WBC 7.7 4.0 - 10.5 K/uL   RBC 4.10 3.87 - 5.11 MIL/uL   Hemoglobin 12.7 12.0 - 15.0 g/dL   HCT 37.6 36.0 - 46.0 %   MCV 91.7 78.0 - 100.0 fL   MCH 31.0 26.0 - 34.0 pg   MCHC 33.8 30.0 - 36.0 g/dL   RDW 13.0 11.5 - 15.5 %   Platelets 301 150 - 400 K/uL   Neutrophils Relative % 67 43 - 77 %   Neutro Abs 5.2 1.7 - 7.7 K/uL   Lymphocytes Relative 18 12 - 46 %   Lymphs Abs 1.4 0.7 - 4.0 K/uL   Monocytes Relative 12 3 - 12 %   Monocytes Absolute 0.9 0.1 - 1.0 K/uL   Eosinophils Relative 3 0 - 5 %   Eosinophils Absolute 0.2 0.0 - 0.7 K/uL   Basophils Relative 0 0 - 1 %   Basophils Absolute 0.0 0.0 - 0.1 K/uL  Comprehensive metabolic panel     Status: Abnormal   Collection Time: 04/11/15 10:15 PM  Result Value Ref Range   Sodium 133 (Lowe) 135 - 145 mmol/Lowe   Potassium  4.3 3.5 - 5.1 mmol/Lowe   Chloride 94 (Lowe) 96 - 112 mmol/Lowe   CO2 29 19 - 32 mmol/Lowe   Glucose, Bld 109 (H) 70 - 99 mg/dL   BUN 16 6 - 23 mg/dL   Creatinine, Ser 1.11 (H) 0.50 - 1.10 mg/dL   Calcium 9.4 8.4 - 10.5 mg/dL   Total Protein 7.5 6.0 - 8.3 g/dL   Albumin 3.8 3.5 - 5.2 g/dL   AST 17 0 - 37 U/Lowe   ALT 9 0 - 35 U/Lowe   Alkaline Phosphatase 74 39 - 117 U/Lowe   Total Bilirubin 0.7 0.3 - 1.2 mg/dL   GFR calc non Af Amer 43 (Lowe) >90 mL/min   GFR calc Af Amer 50 (Lowe) >90 mL/min    Comment: (NOTE) The eGFR has been calculated using the CKD EPI equation. This calculation has not been validated in all clinical situations. eGFR's persistently <90 mL/min signify possible Chronic Kidney Disease.    Anion gap 10 5 - 15  Troponin I     Status: None   Collection Time: 04/11/15 10:15 PM  Result Value Ref Range   Troponin I <0.03 <0.031 ng/mL  Comment:        NO INDICATION OF MYOCARDIAL INJURY.   Brain natriuretic peptide     Status: None   Collection Time: 04/11/15 10:15 PM  Result Value Ref Range   B Natriuretic Peptide 46.0 0.0 - 100.0 pg/mL  Culture, blood (routine x 2)     Status: None (Preliminary result)   Collection Time: 04/12/15  1:02 AM  Result Value Ref Range   Specimen Description LEFT ANTECUBITAL    Special Requests BOTTLES DRAWN AEROBIC AND ANAEROBIC 8CC EACH    Culture PENDING    Report Status PENDING   Culture, blood (routine x 2)     Status: None (Preliminary result)   Collection Time: 04/12/15  1:15 AM  Result Value Ref Range   Specimen Description RIGHT ANTECUBITAL    Special Requests BOTTLES DRAWN AEROBIC AND ANAEROBIC 8CC EACH    Culture PENDING    Report Status PENDING   CBC     Status: Abnormal   Collection Time: 04/12/15  5:08 AM  Result Value Ref Range   WBC 6.3 4.0 - 10.5 K/uL   RBC 3.56 (Lowe) 3.87 - 5.11 MIL/uL   Hemoglobin 11.1 (Lowe) 12.0 - 15.0 g/dL   HCT 33.1 (Lowe) 36.0 - 46.0 %   MCV 93.0 78.0 - 100.0 fL   MCH 31.2 26.0 - 34.0 pg   MCHC 33.5 30.0 - 36.0  g/dL   RDW 12.8 11.5 - 15.5 %   Platelets 254 150 - 400 K/uL  Comprehensive metabolic panel     Status: Abnormal   Collection Time: 04/12/15  5:08 AM  Result Value Ref Range   Sodium 135 135 - 145 mmol/Lowe   Potassium 3.8 3.5 - 5.1 mmol/Lowe   Chloride 98 96 - 112 mmol/Lowe   CO2 32 19 - 32 mmol/Lowe   Glucose, Bld 78 70 - 99 mg/dL   BUN 17 6 - 23 mg/dL   Creatinine, Ser 1.02 0.50 - 1.10 mg/dL   Calcium 8.9 8.4 - 10.5 mg/dL   Total Protein 6.3 6.0 - 8.3 g/dL   Albumin 3.0 (Lowe) 3.5 - 5.2 g/dL   AST 12 0 - 37 U/Lowe   ALT 7 0 - 35 U/Lowe   Alkaline Phosphatase 63 39 - 117 U/Lowe   Total Bilirubin 0.7 0.3 - 1.2 mg/dL   GFR calc non Af Amer 47 (Lowe) >90 mL/min   GFR calc Af Amer 55 (Lowe) >90 mL/min    Comment: (NOTE) The eGFR has been calculated using the CKD EPI equation. This calculation has not been validated in all clinical situations. eGFR's persistently <90 mL/min signify possible Chronic Kidney Disease.    Anion gap 5 5 - 15    ABGS No results for input(s): PHART, PO2ART, TCO2, HCO3 in the last 72 hours.  Invalid input(s): PCO2 CULTURES Recent Results (from the past 240 hour(s))  Culture, blood (routine x 2)     Status: None (Preliminary result)   Collection Time: 04/12/15  1:02 AM  Result Value Ref Range Status   Specimen Description LEFT ANTECUBITAL  Final   Special Requests BOTTLES DRAWN AEROBIC AND ANAEROBIC [place]  Final   Culture PENDING  Incomplete   Report Status PENDING  Incomplete  Culture, blood (routine x 2)     Status: None (Preliminary result)   Collection Time: 04/12/15  1:15 AM  Result Value Ref Range Status   Specimen Description RIGHT ANTECUBITAL  Final   Special Requests BOTTLES DRAWN AEROBIC AND ANAEROBIC [place]  Final   Culture PENDING  Incomplete   Report Status PENDING  Incomplete   Studies/Results: Dg Chest 2 View  04/11/2015   CLINICAL DATA:  Midsternal chest pain radiating to the left inframammary region, productive cough and fever  EXAM: CHEST  2 VIEW   COMPARISON:  11/22/2014 chest CT and radiograph  FINDINGS: Patchy right middle lobe consolidation is identified. Linear left lower lobe scarring is stable. Heart size is mildly enlarged. Probable hiatal hernia. No significant pleural effusion. Rightward curvature of the lower thoracic spine is reidentified. No acute osseous abnormality.  IMPRESSION: Patchy right middle lobe consolidation superimposed on hyperinflation suggesting emphysema. This could represent superimposed pneumonia.   Electronically Signed   By: Conchita Paris M.D.   On: 04/11/2015 22:30    Medications:  Prior to Admission:  Prescriptions prior to admission  Medication Sig Dispense Refill Last Dose  . acetaminophen (TYLENOL) 500 MG tablet Take 500 mg by mouth daily as needed for mild pain or moderate pain.   04/11/2015 at 2030  . albuterol (PROVENTIL) (2.5 MG/3ML) 0.083% nebulizer solution Take 2.5 mg by nebulization 3 (three) times daily.    04/11/2015 at Unknown time  . aspirin EC 81 MG tablet Take 81 mg by mouth daily.   04/11/2015 at Unknown time  . budesonide-formoterol (SYMBICORT) 160-4.5 MCG/ACT inhaler Inhale 2 puffs into the lungs 2 (two) times daily.    04/11/2015 at Unknown time  . citalopram (CELEXA) 20 MG tablet Take 20 mg by mouth at bedtime.    04/10/2015 at Unknown time  . diazepam (VALIUM) 10 MG tablet Take 10 mg by mouth 3 (three) times daily as needed for anxiety.   Past Week at Unknown time  . gabapentin (NEURONTIN) 100 MG tablet Take 100 mg by mouth 5 (five) times daily.    04/11/2015 at Unknown time  . HYDROcodone-acetaminophen (NORCO) 7.5-325 MG per tablet Take 1 tablet by mouth every 6 (six) hours as needed for moderate pain. (Patient taking differently: Take 1 tablet by mouth at bedtime. ) 15 tablet 0 04/10/2015 at Unknown time  . tiotropium (SPIRIVA) 18 MCG inhalation capsule Place 18 mcg into inhaler and inhale every evening.    04/11/2015 at Unknown time  . torsemide (DEMADEX) 20 MG tablet Take 20 mg by mouth  every morning.    04/11/2015 at Unknown time  . ondansetron (ZOFRAN ODT) 4 MG disintegrating tablet Take 1 tablet (4 mg total) by mouth every 8 (eight) hours as needed. (Patient not taking: Reported on 04/11/2015) 12 tablet 1    Scheduled: . aspirin EC  81 mg Oral Daily  . budesonide-formoterol  2 puff Inhalation BID  . citalopram  20 mg Oral QHS  . enoxaparin (LOVENOX) injection  30 mg Subcutaneous Q24H  . gabapentin  100 mg Oral 5 X Daily  . guaiFENesin  600 mg Oral BID  . ipratropium-albuterol  3 mL Nebulization Q6H  . [START ON 04/14/2015] levofloxacin (LEVAQUIN) IV  500 mg Intravenous Q48H  . torsemide  20 mg Oral q morning - 10a   Continuous: . sodium chloride 75 mL/hr at 04/12/15 0145   SLP:[id number], diazepam, HYDROcodone-acetaminophen, ondansetron **OR** ondansetron (ZOFRAN) IV  Assesment: She is admitted with community-acquired pneumonia. She has multiple other medical problems including aortic aneurysms and she is not a surgical candidate. She has chronic low back pain which is unchanged. She has COPD which is worse with pneumonia Principal Problem:   CAP (community acquired pneumonia) Active Problems:   Abdominal aortic aneurysm  Pneumonia    Plan: Continue current treatments    LOS: 1 day   Melanie Lowe 04/12/2015, 8:51 AM

## 2015-04-12 NOTE — ED Notes (Signed)
Lennie Odor [phone]

## 2015-04-12 NOTE — Progress Notes (Signed)
UR chart review completed.  

## 2015-04-12 NOTE — H&P (Signed)
PCP:   Fredirick Maudlin, MD   Chief Complaint:  Shortness of breath  HPI:  81 year old female who  has a past medical history of COPD (chronic obstructive pulmonary disease); Hypertension; Anxiety; Depression; Arthritis; AAA (abdominal aortic aneurysm); Neuropathy; Leg swelling; Oxygen dependent; Myocardial infarction; Pneumonia; and Stroke. today came to the ED with chief complaint of difficulty breathing. Patient also has been offering yellow-colored phlegm with intermittent fever. Patient has history of COPD and is on 3 L oxygen at home. She denies chest pain, no nausea vomiting or diarrhea. She complains of dysuria. In the ED chest x-ray showed infiltrate superimposed on emphysema likely pneumonia.    Allergies:   Allergies  Allergen Reactions  . Nsaids     Per patients son due to AAA.  Can only take coated asprin  . Other     FOOD: Spice Triggers acid reflux  . Tomato     Triggers acid reflux      Past Medical History  Diagnosis Date  . COPD (chronic obstructive pulmonary disease)   . Hypertension   . Anxiety   . Depression   . Arthritis   . AAA (abdominal aortic aneurysm)   . Neuropathy   . Leg swelling   . Oxygen dependent   . Myocardial infarction   . Pneumonia   . Stroke     Past Surgical History  Procedure Laterality Date  . Cholecystectomy    . Appendectomy      Prior to Admission medications   Medication Sig Start Date End Date Taking? Authorizing Provider  acetaminophen (TYLENOL) 500 MG tablet Take 500 mg by mouth daily as needed for mild pain or moderate pain.   Yes Historical Provider, MD  albuterol (PROVENTIL) (2.5 MG/3ML) 0.083% nebulizer solution Take 2.5 mg by nebulization 3 (three) times daily.    Yes Historical Provider, MD  aspirin EC 81 MG tablet Take 81 mg by mouth daily.   Yes Historical Provider, MD  budesonide-formoterol (SYMBICORT) 160-4.5 MCG/ACT inhaler Inhale 2 puffs into the lungs 2 (two) times daily.    Yes Historical Provider,  MD  citalopram (CELEXA) 20 MG tablet Take 20 mg by mouth at bedtime.    Yes Historical Provider, MD  diazepam (VALIUM) 10 MG tablet Take 10 mg by mouth 3 (three) times daily as needed for anxiety.   Yes Historical Provider, MD  gabapentin (NEURONTIN) 100 MG tablet Take 100 mg by mouth 5 (five) times daily.    Yes Historical Provider, MD  HYDROcodone-acetaminophen (NORCO) 7.5-325 MG per tablet Take 1 tablet by mouth every 6 (six) hours as needed for moderate pain. Patient taking differently: Take 1 tablet by mouth at bedtime.  09/05/14  Yes Gerhard Munch, MD  tiotropium (SPIRIVA) 18 MCG inhalation capsule Place 18 mcg into inhaler and inhale every evening.    Yes Historical Provider, MD  torsemide (DEMADEX) 20 MG tablet Take 20 mg by mouth every morning.    Yes Historical Provider, MD  ondansetron (ZOFRAN ODT) 4 MG disintegrating tablet Take 1 tablet (4 mg total) by mouth every 8 (eight) hours as needed. Patient not taking: Reported on 04/11/2015 11/22/14   [name], MD    Social History:  reports that she quit smoking about 4 years ago. Her smoking use included Cigarettes. She quit after 50 years of use. She quit smokeless tobacco use about 4 years ago. She reports that she does not drink alcohol or use illicit drugs.  Family History  Problem Relation Age of Onset  .  Diabetes Brother   . Cancer Brother     Throat cancer, Heart attack  . Heart disease Brother   . Heart attack Brother   . Cancer Father   . Cancer Sister      All the positives are listed in BOLD  Review of Systems:  HEENT: Headache, blurred vision, runny nose, sore throat Neck: Hypothyroidism, hyperthyroidism,,lymphadenopathy Chest : Shortness of breath, history of COPD, Asthma Heart : Chest pain, history of coronary arterey disease GI:  Nausea, vomiting, diarrhea, constipation, GERD GU: Dysuria, urgency, frequency of urination, hematuria Neuro: Stroke, seizures, syncope Psych: Depression, anxiety,  hallucinations   Physical Exam: Blood pressure 112/72, pulse 54, temperature 98.2 F (36.8 C), temperature source Oral, resp. rate 17, height  (1.549 m), weight 66.225 kg (146 lb), SpO2 98 %. Constitutional:   Patient is a well-developed and well-nourished *female in no acute distress and cooperative with exam. Head: Normocephalic and atraumatic Mouth: Mucus membranes moist Eyes: PERRL, EOMI, conjunctivae normal Neck: Supple, No Thyromegaly Cardiovascular: RRR, S1 normal, S2 normal Pulmonary/Chest: crackles auscultated in the right lung fields Abdominal: Soft. Non-tender, non-distended, bowel sounds are normal, no masses, organomegaly, or guarding present.  Neurological: A&O x3, Strength is normal and symmetric bilaterally, cranial nerve II-XII are grossly intact, no focal motor deficit, sensory intact to light touch bilaterally.  Extremities : No Cyanosis, Clubbing or Edema  Labs on Admission:  Basic Metabolic Panel:  Recent Labs Lab 04/11/15 2215  NA 133*  K 4.3  CL 94*  CO2 29  GLUCOSE 109*  BUN 16  CREATININE 1.11*  CALCIUM 9.4   Liver Function Tests:  Recent Labs Lab 04/11/15 2215  AST 17  ALT 9  ALKPHOS 74  BILITOT 0.7  PROT 7.5  ALBUMIN 3.8   No results for input(s): LIPASE, AMYLASE in the last 168 hours. No results for input(s): AMMONIA in the last 168 hours. CBC:  Recent Labs Lab 04/11/15 2215  WBC 7.7  NEUTROABS 5.2  HGB 12.7  HCT 37.6  MCV 91.7  PLT 301   Cardiac Enzymes:  Recent Labs Lab 04/11/15 2215  TROPONINI <0.03    BNP (last 3 results)  Recent Labs  04/11/15 2215  BNP 46.0    ProBNP (last 3 results)  Recent Labs  09/05/14 1736 10/26/14 1410  PROBNP 215.3 500.0*    CBG: No results for input(s): GLUCAP in the last 168 hours.  Radiological Exams on Admission: Dg Chest 2 View  04/11/2015   CLINICAL DATA:  Midsternal chest pain radiating to the left inframammary region, productive cough and fever  EXAM: CHEST  2  VIEW  COMPARISON:  11/22/2014 chest CT and radiograph  FINDINGS: Patchy right middle lobe consolidation is identified. Linear left lower lobe scarring is stable. Heart size is mildly enlarged. Probable hiatal hernia. No significant pleural effusion. Rightward curvature of the lower thoracic spine is reidentified. No acute osseous abnormality.  IMPRESSION: Patchy right middle lobe consolidation superimposed on hyperinflation suggesting emphysema. This could represent superimposed pneumonia.   Electronically Signed   By: Christiana Pellant M.D.   On: 04/11/2015 22:30    EKG: Independently reviewed. Normal sinus rhythm   Assessment/Plan Principal Problem:   CAP (community acquired pneumonia) Active Problems:   Abdominal aortic aneurysm   Pneumonia  Community acquired pneumonia Will obtain blood cultures 2, strep pneumo antigen as well as Legionella antigen. Start Levaquin per pharmacy consultation.  COPD We'll start DuoNeb nebulizer every 6 hours, Mucinex 1 tablet by mouth twice a day. Continue  antibiotics as above.   DVT prophylaxis Lovenox  Code status:DO NOT RESUSCITATE  Family discussion: Admission, patients condition and plan of care including tests being ordered have been discussed with the patient and her son and daughter-in-law at bedside* who indicate understanding and agree with the plan and Code Status.   Time Spent on Admission: 60 minutes  Kaianna Dolezal S [facility] Pager: [phone] 04/12/2015, 12:49 AM  If 7PM-7AM, please contact night-coverage  [email or web address]  Password [email or web address]

## 2015-04-12 NOTE — Progress Notes (Signed)
Patient up to chair. No complaints voiced. Norco improved leg pain.

## 2015-04-12 NOTE — Care Management Note (Addendum)
    Page 1 of 1   04/13/2015     8:59:47 AM CARE MANAGEMENT NOTE 04/13/2015  Patient:  Melanie Lowe, Melanie Lowe   Account Number:  [id number]  Date Initiated:  04/12/2015  Documentation initiated by:  Sharrie Rothman  Subjective/Objective Assessment:   Pt admitted from home with pneumonia. Pt lives with her son and will return home at discharge. Pt stated she is very independent with ADL's. Pt has home O2 and neb machine with [employer].     Action/Plan:   Will continue to follow for discharge planning needs.   Anticipated DC Date:  04/13/2015   Anticipated DC Plan:  HOME/SELF CARE      DC Planning Services  CM consult      Choice offered to / List presented to:             Status of service:  Completed, signed off Medicare Important Message given?   (If response is "NO", the following Medicare IM given date fields will be blank) Date Medicare IM given:   Medicare IM given by:   Date Additional Medicare IM given:   Additional Medicare IM given by:    Discharge Disposition:  HOME/SELF CARE  Per UR Regulation:    If discussed at Long Length of Stay Meetings, dates discussed:    Comments:  04/13/15 0900 Arlyss Queen, RN BSN CM Pt discharged home today. No CM needs noted.  04/12/15 1540 Arlyss Queen, RN BSN CM Pt changed to OBS. CC 44 given.

## 2015-04-12 NOTE — Progress Notes (Signed)
Patient continues to be up in chair. Tolerating well.

## 2015-04-13 LAB — URINE MICROSCOPIC-ADD ON

## 2015-04-13 LAB — URINALYSIS, ROUTINE W REFLEX MICROSCOPIC
BILIRUBIN URINE: NEGATIVE
Glucose, UA: NEGATIVE mg/dL
KETONES UR: NEGATIVE mg/dL
NITRITE: NEGATIVE
PROTEIN: NEGATIVE mg/dL
Specific Gravity, Urine: 1.01 (ref 1.005–1.030)
UROBILINOGEN UA: 0.2 mg/dL (ref 0.0–1.0)
pH: 7 (ref 5.0–8.0)

## 2015-04-13 LAB — STREP PNEUMONIAE URINARY ANTIGEN: Strep Pneumo Urinary Antigen: NEGATIVE

## 2015-04-13 MED ORDER — LEVOFLOXACIN 500 MG PO TABS: 500.0000 mg | ORAL_TABLET | Freq: Every day | ORAL | Status: DC

## 2015-04-13 MED ORDER — METHYLPREDNISOLONE (PAK) 4 MG PO TABS: ORAL_TABLET | ORAL | Status: DC

## 2015-04-13 NOTE — Progress Notes (Signed)
UR completed 

## 2015-04-13 NOTE — Progress Notes (Signed)
Pt discharged home today per Dr. Juanetta Gosling. Pt's IV site D/C'd and WDL. Pt's VSS. Pt and daughter provided with home medication list, prescriptions, and discharge instructions. Pt left floor via WC in stable condition accompanied by NT.

## 2015-04-13 NOTE — Discharge Summary (Signed)
Physician Discharge Summary  Patient ID: Melanie Lowe MRN: [id number] DOB/AGE: [birth year] 81 y.o. Primary Care Physician:Catheryne Deford L, MD Admit date: 04/11/2015 Discharge date: 04/13/2015    Discharge Diagnoses:   Principal Problem:   CAP (community acquired pneumonia) Active Problems:   Essential hypertension   Abdominal aortic aneurysm   LOW BACK PAIN   Aneurysm of thoracic aorta   Acute-on-chronic respiratory failure   Chronic respiratory failure with hypoxia   COPD (chronic obstructive pulmonary disease)   Pneumonia     Medication List    TAKE these medications        acetaminophen 500 MG tablet  Commonly known as:  TYLENOL  Take 500 mg by mouth daily as needed for mild pain or moderate pain.     albuterol (2.5 MG/3ML) 0.083% nebulizer solution  Commonly known as:  PROVENTIL  Take 2.5 mg by nebulization 3 (three) times daily.     aspirin EC 81 MG tablet  Take 81 mg by mouth daily.     budesonide-formoterol 160-4.5 MCG/ACT inhaler  Commonly known as:  SYMBICORT  Inhale 2 puffs into the lungs 2 (two) times daily.     citalopram 20 MG tablet  Commonly known as:  CELEXA  Take 20 mg by mouth at bedtime.     diazepam 10 MG tablet  Commonly known as:  VALIUM  Take 10 mg by mouth 3 (three) times daily as needed for anxiety.     gabapentin 100 MG tablet  Commonly known as:  NEURONTIN  Take 100 mg by mouth 5 (five) times daily.     HYDROcodone-acetaminophen 7.5-325 MG per tablet  Commonly known as:  NORCO  Take 1 tablet by mouth every 6 (six) hours as needed for moderate pain.     levofloxacin 500 MG tablet  Commonly known as:  LEVAQUIN  Take 1 tablet (500 mg total) by mouth daily.     methylPREDNIsolone 4 MG tablet  Commonly known as:  MEDROL DOSPACK  follow package directions     ondansetron 4 MG disintegrating tablet  Commonly known as:  ZOFRAN ODT  Take 1 tablet (4 mg total) by mouth every 8 (eight) hours as needed.     tiotropium 18 MCG  inhalation capsule  Commonly known as:  SPIRIVA  Place 18 mcg into inhaler and inhale every evening.     torsemide 20 MG tablet  Commonly known as:  DEMADEX  Take 20 mg by mouth every morning.        Discharged Condition: Improved    Consults: None  Significant Diagnostic Studies: Dg Chest 2 View  04/11/2015   CLINICAL DATA:  Midsternal chest pain radiating to the left inframammary region, productive cough and fever  EXAM: CHEST  2 VIEW  COMPARISON:  11/22/2014 chest CT and radiograph  FINDINGS: Patchy right middle lobe consolidation is identified. Linear left lower lobe scarring is stable. Heart size is mildly enlarged. Probable hiatal hernia. No significant pleural effusion. Rightward curvature of the lower thoracic spine is reidentified. No acute osseous abnormality.  IMPRESSION: Patchy right middle lobe consolidation superimposed on hyperinflation suggesting emphysema. This could represent superimposed pneumonia.   Electronically Signed   By: Christiana Pellant M.D.   On: 04/11/2015 22:30    Lab Results: Basic Metabolic Panel:  Recent Labs  40/98/11 2215 04/12/15 0508  NA 133* 135  K 4.3 3.8  CL 94* 98  CO2 29 32  GLUCOSE 109* 78  BUN 16 17  CREATININE 1.11* 1.02  CALCIUM 9.4  8.9   Liver Function Tests:  Recent Labs  04/11/15 2215 04/12/15 0508  AST 17 12  ALT 9 7  ALKPHOS 74 63  BILITOT 0.7 0.7  PROT 7.5 6.3  ALBUMIN 3.8 3.0*     CBC:  Recent Labs  04/11/15 2215 04/12/15 0508  WBC 7.7 6.3  NEUTROABS 5.2  --   HGB 12.7 11.1*  HCT 37.6 33.1*  MCV 91.7 93.0  PLT 301 254    Recent Results (from the past 240 hour(s))  Culture, blood (routine x 2)     Status: None (Preliminary result)   Collection Time: 04/12/15  1:02 AM  Result Value Ref Range Status   Specimen Description LEFT ANTECUBITAL  Final   Special Requests BOTTLES DRAWN AEROBIC AND ANAEROBIC 8CC EACH  Final   Culture NO GROWTH <24 HRS  Final   Report Status PENDING  Incomplete  Culture,  blood (routine x 2)     Status: None (Preliminary result)   Collection Time: 04/12/15  1:15 AM  Result Value Ref Range Status   Specimen Description RIGHT ANTECUBITAL  Final   Special Requests BOTTLES DRAWN AEROBIC AND ANAEROBIC 8CC EACH  Final   Culture NO GROWTH <24 HRS  Final   Report Status PENDING  Incomplete     Hospital Course: This is an 81 year old has a long history of COPD and chronic respiratory failure. She has multiple other medical problems as well. She has thoracic and abdominal aortic aneurysms and is not felt to be a candidate for surgery or repair. She came to the emergency department with increased shortness of breath and was found to have pneumonia. She was admitted to the hospital started on IV antibiotics and improved rapidly. By the time of discharge she was back to baseline  Discharge Exam: Blood pressure 149/77, pulse 65, temperature 98.7 F (37.1 C), temperature source Oral, resp. rate 16, height 5\' 1"  (1.549 m), weight 58.695 kg (129 lb 6.4 oz), SpO2 92 %. She is awake and alert. Her chest is clear now. Heart is regular without gallop.  Disposition: Home she does not want home health services      Discharge Instructions    Discharge patient    Complete by:  As directed              Signed: Kortny Lirette L   04/13/2015, 8:43 AM

## 2015-04-13 NOTE — Progress Notes (Signed)
Subjective: She says she feels okay. She is not very short of breath. She is not having any pain.  Objective: Vital signs in last 24 hours: Temp:  [98.5 F (36.9 C)-98.7 F (37.1 C)] 98.7 F (37.1 C) (04/12 2129) Pulse Rate:  [65-78] 65 (04/12 2129) Resp:  [16] 16 (04/12 2129) BP: (93-149)/(55-77) 149/77 mmHg (04/12 2129) SpO2:  [92 %-100 %] 92 % (04/13 0721) Weight change:  Last BM Date: 04/11/15  Intake/Output from previous day: 04/12 0701 - 04/13 0700 In: 1460 [P.O.:560; I.V.:900] Out: -   PHYSICAL EXAM General appearance: alert, cooperative and no distress Resp: clear to auscultation bilaterally Cardio: regular rate and rhythm, S1, S2 normal, no murmur, click, rub or gallop GI: soft, non-tender; bowel sounds normal; no masses,  no organomegaly Extremities: extremities normal, atraumatic, no cyanosis or edema  Lab Results:  Results for orders placed or performed during the hospital encounter of 04/11/15 (from the past 48 hour(s))  CBC with Differential     Status: None   Collection Time: 04/11/15 10:15 PM  Result Value Ref Range   WBC 7.7 4.0 - 10.5 K/uL   RBC 4.10 3.87 - 5.11 MIL/uL   Hemoglobin 12.7 12.0 - 15.0 g/dL   HCT 37.6 36.0 - 46.0 %   MCV 91.7 78.0 - 100.0 fL   MCH 31.0 26.0 - 34.0 pg   MCHC 33.8 30.0 - 36.0 g/dL   RDW 13.0 11.5 - 15.5 %   Platelets 301 150 - 400 K/uL   Neutrophils Relative % 67 43 - 77 %   Neutro Abs 5.2 1.7 - 7.7 K/uL   Lymphocytes Relative 18 12 - 46 %   Lymphs Abs 1.4 0.7 - 4.0 K/uL   Monocytes Relative 12 3 - 12 %   Monocytes Absolute 0.9 0.1 - 1.0 K/uL   Eosinophils Relative 3 0 - 5 %   Eosinophils Absolute 0.2 0.0 - 0.7 K/uL   Basophils Relative 0 0 - 1 %   Basophils Absolute 0.0 0.0 - 0.1 K/uL  Comprehensive metabolic panel     Status: Abnormal   Collection Time: 04/11/15 10:15 PM  Result Value Ref Range   Sodium 133 (L) 135 - 145 mmol/L   Potassium 4.3 3.5 - 5.1 mmol/L   Chloride 94 (L) 96 - 112 mmol/L   CO2 29 19 - 32  mmol/L   Glucose, Bld 109 (H) 70 - 99 mg/dL   BUN 16 6 - 23 mg/dL   Creatinine, Ser 1.11 (H) 0.50 - 1.10 mg/dL   Calcium 9.4 8.4 - 10.5 mg/dL   Total Protein 7.5 6.0 - 8.3 g/dL   Albumin 3.8 3.5 - 5.2 g/dL   AST 17 0 - 37 U/L   ALT 9 0 - 35 U/L   Alkaline Phosphatase 74 39 - 117 U/L   Total Bilirubin 0.7 0.3 - 1.2 mg/dL   GFR calc non Af Amer 43 (L) >90 mL/min   GFR calc Af Amer 50 (L) >90 mL/min    Comment: (NOTE) The eGFR has been calculated using the CKD EPI equation. This calculation has not been validated in all clinical situations. eGFR's persistently <90 mL/min signify possible Chronic Kidney Disease.    Anion gap 10 5 - 15  Troponin I     Status: None   Collection Time: 04/11/15 10:15 PM  Result Value Ref Range   Troponin I <0.03 <0.031 ng/mL    Comment:        NO INDICATION OF MYOCARDIAL INJURY.  Brain natriuretic peptide     Status: None   Collection Time: 04/11/15 10:15 PM  Result Value Ref Range   B Natriuretic Peptide 46.0 0.0 - 100.0 pg/mL  Culture, blood (routine x 2)     Status: None (Preliminary result)   Collection Time: 04/12/15  1:02 AM  Result Value Ref Range   Specimen Description LEFT ANTECUBITAL    Special Requests BOTTLES DRAWN AEROBIC AND ANAEROBIC 8CC EACH    Culture NO GROWTH <24 HRS    Report Status PENDING   Culture, blood (routine x 2)     Status: None (Preliminary result)   Collection Time: 04/12/15  1:15 AM  Result Value Ref Range   Specimen Description RIGHT ANTECUBITAL    Special Requests BOTTLES DRAWN AEROBIC AND ANAEROBIC 8CC EACH    Culture NO GROWTH <24 HRS    Report Status PENDING   CBC     Status: Abnormal   Collection Time: 04/12/15  5:08 AM  Result Value Ref Range   WBC 6.3 4.0 - 10.5 K/uL   RBC 3.56 (L) 3.87 - 5.11 MIL/uL   Hemoglobin 11.1 (L) 12.0 - 15.0 g/dL   HCT 33.1 (L) 36.0 - 46.0 %   MCV 93.0 78.0 - 100.0 fL   MCH 31.2 26.0 - 34.0 pg   MCHC 33.5 30.0 - 36.0 g/dL   RDW 12.8 11.5 - 15.5 %   Platelets 254 150 -  400 K/uL  Comprehensive metabolic panel     Status: Abnormal   Collection Time: 04/12/15  5:08 AM  Result Value Ref Range   Sodium 135 135 - 145 mmol/L   Potassium 3.8 3.5 - 5.1 mmol/L   Chloride 98 96 - 112 mmol/L   CO2 32 19 - 32 mmol/L   Glucose, Bld 78 70 - 99 mg/dL   BUN 17 6 - 23 mg/dL   Creatinine, Ser 1.02 0.50 - 1.10 mg/dL   Calcium 8.9 8.4 - 10.5 mg/dL   Total Protein 6.3 6.0 - 8.3 g/dL   Albumin 3.0 (L) 3.5 - 5.2 g/dL   AST 12 0 - 37 U/L   ALT 7 0 - 35 U/L   Alkaline Phosphatase 63 39 - 117 U/L   Total Bilirubin 0.7 0.3 - 1.2 mg/dL   GFR calc non Af Amer 47 (L) >90 mL/min   GFR calc Af Amer 55 (L) >90 mL/min    Comment: (NOTE) The eGFR has been calculated using the CKD EPI equation. This calculation has not been validated in all clinical situations. eGFR's persistently <90 mL/min signify possible Chronic Kidney Disease.    Anion gap 5 5 - 15  Urinalysis, Routine w reflex microscopic     Status: Abnormal   Collection Time: 04/13/15  3:06 AM  Result Value Ref Range   Color, Urine YELLOW YELLOW   APPearance CLEAR CLEAR   Specific Gravity, Urine 1.010 1.005 - 1.030   pH 7.0 5.0 - 8.0   Glucose, UA NEGATIVE NEGATIVE mg/dL   Hgb urine dipstick SMALL (A) NEGATIVE   Bilirubin Urine NEGATIVE NEGATIVE   Ketones, ur NEGATIVE NEGATIVE mg/dL   Protein, ur NEGATIVE NEGATIVE mg/dL   Urobilinogen, UA 0.2 0.0 - 1.0 mg/dL   Nitrite NEGATIVE NEGATIVE   Leukocytes, UA LARGE (A) NEGATIVE  Urine microscopic-add on     Status: Abnormal   Collection Time: 04/13/15  3:06 AM  Result Value Ref Range   Squamous Epithelial / LPF FEW (A) RARE   WBC, UA TOO NUMEROUS TO  COUNT <3 WBC/hpf   RBC / HPF 0-2 <3 RBC/hpf   Bacteria, UA MANY (A) RARE    ABGS No results for input(s): PHART, PO2ART, TCO2, HCO3 in the last 72 hours.  Invalid input(s): PCO2 CULTURES Recent Results (from the past 240 hour(s))  Culture, blood (routine x 2)     Status: None (Preliminary result)   Collection  Time: 04/12/15  1:02 AM  Result Value Ref Range Status   Specimen Description LEFT ANTECUBITAL  Final   Special Requests BOTTLES DRAWN AEROBIC AND ANAEROBIC 8CC EACH  Final   Culture NO GROWTH <24 HRS  Final   Report Status PENDING  Incomplete  Culture, blood (routine x 2)     Status: None (Preliminary result)   Collection Time: 04/12/15  1:15 AM  Result Value Ref Range Status   Specimen Description RIGHT ANTECUBITAL  Final   Special Requests BOTTLES DRAWN AEROBIC AND ANAEROBIC 8CC EACH  Final   Culture NO GROWTH <24 HRS  Final   Report Status PENDING  Incomplete   Studies/Results: Dg Chest 2 View  04/11/2015   CLINICAL DATA:  Midsternal chest pain radiating to the left inframammary region, productive cough and fever  EXAM: CHEST  2 VIEW  COMPARISON:  11/22/2014 chest CT and radiograph  FINDINGS: Patchy right middle lobe consolidation is identified. Linear left lower lobe scarring is stable. Heart size is mildly enlarged. Probable hiatal hernia. No significant pleural effusion. Rightward curvature of the lower thoracic spine is reidentified. No acute osseous abnormality.  IMPRESSION: Patchy right middle lobe consolidation superimposed on hyperinflation suggesting emphysema. This could represent superimposed pneumonia.   Electronically Signed   By: Conchita Paris M.D.   On: 04/11/2015 22:30    Medications:  Prior to Admission:  Prescriptions prior to admission  Medication Sig Dispense Refill Last Dose  . acetaminophen (TYLENOL) 500 MG tablet Take 500 mg by mouth daily as needed for mild pain or moderate pain.   04/11/2015 at 2030  . albuterol (PROVENTIL) (2.5 MG/3ML) 0.083% nebulizer solution Take 2.5 mg by nebulization 3 (three) times daily.    04/11/2015 at Unknown time  . aspirin EC 81 MG tablet Take 81 mg by mouth daily.   04/11/2015 at Unknown time  . budesonide-formoterol (SYMBICORT) 160-4.5 MCG/ACT inhaler Inhale 2 puffs into the lungs 2 (two) times daily.    04/11/2015 at Unknown time   . citalopram (CELEXA) 20 MG tablet Take 20 mg by mouth at bedtime.    04/10/2015 at Unknown time  . diazepam (VALIUM) 10 MG tablet Take 10 mg by mouth 3 (three) times daily as needed for anxiety.   Past Week at Unknown time  . gabapentin (NEURONTIN) 100 MG tablet Take 100 mg by mouth 5 (five) times daily.    04/11/2015 at Unknown time  . HYDROcodone-acetaminophen (NORCO) 7.5-325 MG per tablet Take 1 tablet by mouth every 6 (six) hours as needed for moderate pain. (Patient taking differently: Take 1 tablet by mouth at bedtime. ) 15 tablet 0 04/10/2015 at Unknown time  . tiotropium (SPIRIVA) 18 MCG inhalation capsule Place 18 mcg into inhaler and inhale every evening.    04/11/2015 at Unknown time  . torsemide (DEMADEX) 20 MG tablet Take 20 mg by mouth every morning.    04/11/2015 at Unknown time  . ondansetron (ZOFRAN ODT) 4 MG disintegrating tablet Take 1 tablet (4 mg total) by mouth every 8 (eight) hours as needed. (Patient not taking: Reported on 04/11/2015) 12 tablet 1    Scheduled: .  aspirin EC  81 mg Oral Daily  . budesonide-formoterol  2 puff Inhalation BID  . citalopram  20 mg Oral QHS  . enoxaparin (LOVENOX) injection  30 mg Subcutaneous Q24H  . gabapentin  100 mg Oral 5 X Daily  . guaiFENesin  600 mg Oral BID  . ipratropium-albuterol  3 mL Nebulization Q6H  . [START ON 04/14/2015] levofloxacin (LEVAQUIN) IV  500 mg Intravenous Q48H  . torsemide  20 mg Oral q morning - 10a   Continuous: . sodium chloride 1 mL (04/13/15 0521)   ZDK:EUVHAWUJN, diazepam, HYDROcodone-acetaminophen, ondansetron **OR** ondansetron (ZOFRAN) IV  Assesment: She was admitted with community-acquired pneumonia and acute on chronic respiratory failure. She is much improved. Her chest is clear now. She does not appear to be short of breath. I think she is ready for discharge. She has COPD at baseline and that's doing pretty well.  She has chronic back pain on chronic narcotic treatment.  She has hypertension which  is pre-well controlled.  She has abdominal and thoracic aortic aneurysms and is not a candidate for surgery Principal Problem:   CAP (community acquired pneumonia) Active Problems:   Essential hypertension   Abdominal aortic aneurysm   LOW BACK PAIN   Aneurysm of thoracic aorta   Acute-on-chronic respiratory failure   Chronic respiratory failure with hypoxia   COPD (chronic obstructive pulmonary disease)   Pneumonia    Plan: Discharge home today we discussed home health but she doesn't want to do that    LOS: 2 days   Glory Graefe L 04/13/2015, 8:39 AM

## 2015-04-14 ENCOUNTER — Encounter (HOSPITAL_COMMUNITY): Payer: Self-pay | Admitting: *Deleted

## 2015-04-14 ENCOUNTER — Emergency Department (HOSPITAL_COMMUNITY): Payer: Medicare HMO

## 2015-04-14 ENCOUNTER — Emergency Department (HOSPITAL_COMMUNITY)
Admission: EM | Admit: 2015-04-14 | Discharge: 2015-04-15 | Disposition: A | Discharge status: Home / Self Care | Payer: Medicare HMO | Attending: Emergency Medicine | Admitting: Emergency Medicine

## 2015-04-14 DIAGNOSIS — M1990 Unspecified osteoarthritis, unspecified site: Secondary | ICD-10-CM | POA: Diagnosis not present

## 2015-04-14 DIAGNOSIS — G629 Polyneuropathy, unspecified: Secondary | ICD-10-CM | POA: Diagnosis not present

## 2015-04-14 DIAGNOSIS — Z7951 Long term (current) use of inhaled steroids: Secondary | ICD-10-CM | POA: Insufficient documentation

## 2015-04-14 DIAGNOSIS — R0789 Other chest pain: Secondary | ICD-10-CM | POA: Insufficient documentation

## 2015-04-14 DIAGNOSIS — Z9981 Dependence on supplemental oxygen: Secondary | ICD-10-CM | POA: Insufficient documentation

## 2015-04-14 DIAGNOSIS — Z7982 Long term (current) use of aspirin: Secondary | ICD-10-CM | POA: Insufficient documentation

## 2015-04-14 DIAGNOSIS — Z79899 Other long term (current) drug therapy: Secondary | ICD-10-CM | POA: Diagnosis not present

## 2015-04-14 DIAGNOSIS — I252 Old myocardial infarction: Secondary | ICD-10-CM | POA: Diagnosis not present

## 2015-04-14 DIAGNOSIS — R0602 Shortness of breath: Secondary | ICD-10-CM | POA: Diagnosis present

## 2015-04-14 DIAGNOSIS — F329 Major depressive disorder, single episode, unspecified: Secondary | ICD-10-CM | POA: Insufficient documentation

## 2015-04-14 DIAGNOSIS — Z8673 Personal history of transient ischemic attack (TIA), and cerebral infarction without residual deficits: Secondary | ICD-10-CM | POA: Diagnosis not present

## 2015-04-14 DIAGNOSIS — R531 Weakness: Secondary | ICD-10-CM | POA: Insufficient documentation

## 2015-04-14 DIAGNOSIS — F419 Anxiety disorder, unspecified: Secondary | ICD-10-CM | POA: Diagnosis not present

## 2015-04-14 DIAGNOSIS — Z8701 Personal history of pneumonia (recurrent): Secondary | ICD-10-CM | POA: Diagnosis not present

## 2015-04-14 DIAGNOSIS — Z7952 Long term (current) use of systemic steroids: Secondary | ICD-10-CM | POA: Diagnosis not present

## 2015-04-14 DIAGNOSIS — I10 Essential (primary) hypertension: Secondary | ICD-10-CM | POA: Diagnosis not present

## 2015-04-14 DIAGNOSIS — Z87891 Personal history of nicotine dependence: Secondary | ICD-10-CM | POA: Insufficient documentation

## 2015-04-14 DIAGNOSIS — Z792 Long term (current) use of antibiotics: Secondary | ICD-10-CM | POA: Insufficient documentation

## 2015-04-14 DIAGNOSIS — J441 Chronic obstructive pulmonary disease with (acute) exacerbation: Secondary | ICD-10-CM | POA: Diagnosis not present

## 2015-04-14 DIAGNOSIS — IMO0001 Reserved for inherently not codable concepts without codable children: Secondary | ICD-10-CM

## 2015-04-14 LAB — CBC WITH DIFFERENTIAL/PLATELET
Basophils Absolute: 0 10*3/uL (ref 0.0–0.1)
Basophils Relative: 0 % (ref 0–1)
Eosinophils Absolute: 0 10*3/uL (ref 0.0–0.7)
Eosinophils Relative: 0 % (ref 0–5)
HCT: 37 % (ref 36.0–46.0)
HEMOGLOBIN: 12.3 g/dL (ref 12.0–15.0)
LYMPHS ABS: 0.7 10*3/uL (ref 0.7–4.0)
LYMPHS PCT: 13 % (ref 12–46)
MCH: 30.9 pg (ref 26.0–34.0)
MCHC: 33.2 g/dL (ref 30.0–36.0)
MCV: 93 fL (ref 78.0–100.0)
Monocytes Absolute: 0.1 10*3/uL (ref 0.1–1.0)
Monocytes Relative: 2 % — ABNORMAL LOW (ref 3–12)
NEUTROS PCT: 85 % — AB (ref 43–77)
Neutro Abs: 4.6 10*3/uL (ref 1.7–7.7)
PLATELETS: 306 10*3/uL (ref 150–400)
RBC: 3.98 MIL/uL (ref 3.87–5.11)
RDW: 13 % (ref 11.5–15.5)
WBC: 5.4 10*3/uL (ref 4.0–10.5)

## 2015-04-14 LAB — LEGIONELLA ANTIGEN, URINE

## 2015-04-14 LAB — COMPREHENSIVE METABOLIC PANEL
ALT: 12 U/L (ref 0–35)
AST: 21 U/L (ref 0–37)
Albumin: 3.6 g/dL (ref 3.5–5.2)
Alkaline Phosphatase: 76 U/L (ref 39–117)
Anion gap: 8 (ref 5–15)
BUN: 18 mg/dL (ref 6–23)
CALCIUM: 9.5 mg/dL (ref 8.4–10.5)
CO2: 30 mmol/L (ref 19–32)
Chloride: 99 mmol/L (ref 96–112)
Creatinine, Ser: 1.04 mg/dL (ref 0.50–1.10)
GFR calc Af Amer: 54 mL/min — ABNORMAL LOW (ref >90)
GFR calc non Af Amer: 46 mL/min — ABNORMAL LOW (ref >90)
GLUCOSE: 165 mg/dL — AB (ref 70–99)
POTASSIUM: 4.2 mmol/L (ref 3.5–5.1)
Sodium: 137 mmol/L (ref 135–145)
TOTAL PROTEIN: 7.4 g/dL (ref 6.0–8.3)
Total Bilirubin: 0.5 mg/dL (ref 0.3–1.2)

## 2015-04-14 LAB — URINE CULTURE

## 2015-04-14 LAB — TROPONIN I: Troponin I: <0.03 ng/mL (ref <0.031)

## 2015-04-14 NOTE — ED Notes (Signed)
Recent adm for pneumonia, d/c yesterday, and says sob since then.  Pain in chest and sob. On home 02 at 3.5

## 2015-04-15 NOTE — ED Notes (Signed)
Conversed with patient about pain and sob, states she feels like she has the pain from her coughing and he "son over reacts to her sneezes", has been taking meds as ordered on discharge.

## 2015-04-15 NOTE — ED Notes (Signed)
Discharge instructions given, pt demonstrated teach back and verbal understanding. No concerns voiced. Pt stayed in room for extended period of time waiting for son to arrive to pick her up.

## 2015-04-15 NOTE — Discharge Instructions (Signed)
We saw you in the ER for the chest pain/shortness of breath. All of our cardiac workup is normal, including labs, EKG and the chest X-RAY is showing improving lungs. We are not sure what is causing your discomfort, but we feel comfortable sending you home at this time. The pain could be due to the aneurism in your chest, for which you have been asked to see [facility] doctors IF you want further surgeries. The workup in the ER is not complete, and you should follow up with your primary care doctor for further evaluation.  Thoracic Aortic Aneurysm An aneurysm is a bulge in an artery. It happens when the wall of the artery is weakened or damaged. If the aneurysm gets too big, it bursts (ruptures) and severe bleeding occurs. A thoracic aortic aneurysm is an aneurysm that occurs in the first part of the aorta, between the heart and the diaphragm. The aorta is the main artery and supplies blood from the heart to the rest of the body. A thoracic aortic aneurysm can enlarge and rupture or blood can flow between the layers of the wall of the aorta through a tear (aorticdissection). Both of these conditions can cause bleeding inside the body and can be life threatening unless diagnosed and treated promptly. CAUSES  The exact cause of a thoracic aortic aneurysm is often unknown. Some contributing factors are:   A hardening of the arteries caused by the buildup of fat and other substances in the lining of a blood vessel (arteriosclerosis).  Inflammation of the walls of an artery (arteritis).  Connective tissue diseases, such as Marfan syndrome.  Injury or trauma to the aorta.  An infection, such as syphilis or staphylococcus, in the wall of the aorta (infectious aortitis) caused by bacteria. RISK FACTORS  Risk factors that contribute to a thoracic aortic aneurysm may include:  Age older than 60 years.  High blood pressure (hypertension).  Female gender.  Ethnicity (white race).  Obesity.  Family  history of aneurysm (first degree relatives only).  Tobacco use. PREVENTION  The following healthy lifestyle habits may help decrease your risk of a thoracic aortic aneurysm:  Quitting smoking. Smoking can raise your blood pressure and cause arteriosclerosis.  Limiting or avoiding alcohol.  Keeping your blood pressure, blood sugar level, and cholesterol levels within normal limits.  Decreasing your salt intake. In some people, too much salt can raise blood pressure and increase your risk of abdominal aortic aneurysm.  Eating a diet low in saturated fats and cholesterol.  Increasing your fiber intake by including whole grains, vegetables, and fruits in your diet. Eating these foods may help lower blood pressure.  Maintaining a healthy weight.  Staying physically active and exercising regularly. SYMPTOMS  The symptoms of thoracic aortic aneurysm may vary depending on the size and rate of growth of the aneurysm. Most grow slowly and do not have any symptoms. When symptoms do occur, they may include:  Pain (chest, back, sides, or abdomen). The pain may vary in intensity. A sudden onset of severe pain may indicate that the aneurysm has ruptured.  Hoarseness.  Cough.  Shortness of breath.  Swallowing problems.  Nausea or vomiting or both. DIAGNOSIS  Since most unruptured thoracic aortic aneurysms have no symptoms, they are often discovered during diagnostic exams for other conditions. An aneurysm may be found during the following procedures:  Ultrasonography (a one-time screening for thoracic aortic aneurysm by ultrasonography is also recommended for all men aged 65-75 years who have ever smoked).  X-ray exams.  A CT scan.  An MRI.  Angiography or arteriography. TREATMENT  Treatment of a thoracic aortic aneurysm depends on the size of your aneurysm, your age, and risk factors for rupture. Medicine to control blood pressure and pain may be used to manage aneurysms smaller  than 2.3 in (6 cm). Regular monitoring for enlargement may be recommended by your health care provider if:  The aneurysm is 1.2-1.5 in (3-4 cm) in size (an annual ultrasonography may be recommended).  The aneurysm is 1.5-1.8 in (4-4.5 cm) in size (an ultrasonography every 6 months may be recommended).  The aneurysm is larger than 1.8 in (4.5 cm) in size (your health care provider may ask that you be examined by a vascular surgeon). If your aneurysm is larger than 2.2 in (5.5 cm) or if it is enlarging quickly, surgical repair may be recommended. There are two main methods for repair of an aneurysm:   Endovascular repair (a minimally invasive surgery).  Open repair. This method is used if an endovascular repair is not possible. Document Released: 12/17/2005 Document Revised: 10/07/2013 Document Reviewed: 06/29/2013 [facility] Patient Information 2015 [place], [place]. This information is not intended to replace advice given to you by your health care provider. Make sure you discuss any questions you have with your health care provider.

## 2015-04-15 NOTE — ED Notes (Signed)
Waiting for pt son to arrive for discharge.

## 2015-04-15 NOTE — ED Provider Notes (Addendum)
CSN: [id number]     Arrival date & time 04/14/15  2241 History  This chart was scribed for Melanie Kaplan, MD by [name], ED Scribe. This patient was seen in room APA01/APA01 and the patient's care was started at 12:41 AM.    Chief Complaint  Patient presents with  . Shortness of Breath   The history is provided by the patient. No language interpreter was used.    HPI Comments: Melanie Lowe is a 81 y.o. female with a history of COPD, HTN and anxiety who presents to the Emergency Department complaining of constant, moderate, dull CP that has been present for few days now. Pt states productive cough, generalized weakness and SOB as associated symptoms. She reports symptoms become worse with standing and coughing. Pt was admitted on 4/11 for pneumonia and notes the same chest pain during hospitalization and at discharge. Pt is constantly on 3 L/min of O2 at home and has a home health aide. Pt denies tobacco use. She also denies fever as an associated symptom. Pt has hx of thoraco-abdominal aortic aneurysm, and doesn't want invasive procedure for that. She denies radiation of the pain to her back. She denies any near syncope. She does feel weak.  Past Medical History  Diagnosis Date  . COPD (chronic obstructive pulmonary disease)   . Hypertension   . Anxiety   . Depression   . Arthritis   . AAA (abdominal aortic aneurysm)   . Neuropathy   . Leg swelling   . Oxygen dependent   . Myocardial infarction   . Pneumonia   . Stroke    Past Surgical History  Procedure Laterality Date  . Cholecystectomy    . Appendectomy     Family History  Problem Relation Age of Onset  . Diabetes Brother   . Cancer Brother     Throat cancer, Heart attack  . Heart disease Brother   . Heart attack Brother   . Cancer Father   . Cancer Sister    History  Substance Use Topics  . Smoking status: Former Smoker -- 50 years    Types: Cigarettes    Quit date: 12/31/2010  . Smokeless tobacco: Former  Neurosurgeon    Quit date: 12/31/2010  . Alcohol Use: No   OB History    No data available     Review of Systems  Constitutional: Negative for fever.  Respiratory: Positive for cough and shortness of breath.   Cardiovascular: Positive for chest pain.  Neurological: Positive for weakness.  All other systems reviewed and are negative.  Allergies  Nsaids; Other; and Tomato  Home Medications   Prior to Admission medications   Medication Sig Start Date End Date Taking? Authorizing Provider  acetaminophen (TYLENOL) 500 MG tablet Take 500 mg by mouth daily as needed for mild pain or moderate pain.    Historical Provider, MD  albuterol (PROVENTIL) (2.5 MG/3ML) 0.083% nebulizer solution Take 2.5 mg by nebulization 3 (three) times daily.     Historical Provider, MD  aspirin EC 81 MG tablet Take 81 mg by mouth daily.    Historical Provider, MD  budesonide-formoterol (SYMBICORT) 160-4.5 MCG/ACT inhaler Inhale 2 puffs into the lungs 2 (two) times daily.     Historical Provider, MD  citalopram (CELEXA) 20 MG tablet Take 20 mg by mouth at bedtime.     Historical Provider, MD  diazepam (VALIUM) 10 MG tablet Take 10 mg by mouth 3 (three) times daily as needed for anxiety.  Historical Provider, MD  gabapentin (NEURONTIN) 100 MG tablet Take 100 mg by mouth 5 (five) times daily.     Historical Provider, MD  HYDROcodone-acetaminophen (NORCO) 7.5-325 MG per tablet Take 1 tablet by mouth every 6 (six) hours as needed for moderate pain. Patient taking differently: Take 1 tablet by mouth at bedtime.  09/05/14   Gerhard Munch, MD  levofloxacin (LEVAQUIN) 500 MG tablet Take 1 tablet (500 mg total) by mouth daily. 04/13/15   Kari Baars, MD  methylPREDNIsolone (MEDROL DOSPACK) 4 MG tablet follow package directions 04/13/15   Kari Baars, MD  ondansetron (ZOFRAN ODT) 4 MG disintegrating tablet Take 1 tablet (4 mg total) by mouth every 8 (eight) hours as needed. Patient not taking: Reported on 04/11/2015 11/22/14    [name], MD  tiotropium (SPIRIVA) 18 MCG inhalation capsule Place 18 mcg into inhaler and inhale every evening.     Historical Provider, MD  torsemide (DEMADEX) 20 MG tablet Take 20 mg by mouth every morning.     Historical Provider, MD   BP 138/91 mmHg  Pulse 64  Temp(Src) 97.7 F (36.5 C) (Oral)  Resp 24  Wt 129 lb (58.514 kg)  SpO2 97% Physical Exam  Constitutional: She is oriented to person, place, and time. She appears well-developed and well-nourished. No distress.  HENT:  Head: Normocephalic and atraumatic.  Eyes: Conjunctivae and EOM are normal.  Neck: Neck supple. No tracheal deviation present.  Cardiovascular: Normal rate, regular rhythm and normal heart sounds.   Pulmonary/Chest: Effort normal. No respiratory distress. She has no wheezes. She has rales.  Bibasilar rales  Abdominal: Soft. Bowel sounds are normal. There is no tenderness.  Neurological: She is alert and oriented to person, place, and time. No cranial nerve deficit. Coordination normal.  Skin: Skin is warm and dry.  Psychiatric: She has a normal mood and affect. Her behavior is normal.  Nursing note and vitals reviewed.   ED Course  Procedures   DIAGNOSTIC STUDIES: Oxygen Saturation is 100% on RA, normal by my interpretation.    COORDINATION OF CARE: 12:51 AM Discussed treatment plan with pt which includes chest x-ray and lab work. Pt agreed to plan.  Labs Review Labs Reviewed  CBC WITH DIFFERENTIAL/PLATELET - Abnormal; Notable for the following:    Neutrophils Relative % 85 (*)    Monocytes Relative 2 (*)    All other components within normal limits  COMPREHENSIVE METABOLIC PANEL - Abnormal; Notable for the following:    Glucose, Bld 165 (*)    GFR calc non Af Amer 46 (*)    GFR calc Af Amer 54 (*)    All other components within normal limits  TROPONIN I    Imaging Review Dg Chest 2 View  04/14/2015   CLINICAL DATA:  Shortness of breath.  Recent diagnosis of pneumonia.  EXAM: CHEST   2 VIEW  COMPARISON:  04/11/2015  FINDINGS: Mild cardiomegaly and moderate aortic tortuosity is stable. The hila are negative.  Mild improvement in right middle lobe aeration, with appearance again suggestive of pneumonia and superimposed atelectasis. There is chronic lung disease with emphysematous changes and interstitial coarsening. No edema, effusion, or pneumothorax.  IMPRESSION: 1. Improved right middle lobe pneumonia. 2. Emphysema and chronic cardiomegaly.   Electronically Signed   By: Marnee Spring M.D.   On: 04/14/2015 23:43     EKG Interpretation None     :15 am - pt ambulates without any difficulty.   MDM   Final diagnoses:  Atypical chest  pain  COPD bronchitis   I personally performed the services described in this documentation, which was scribed in my presence. The recorded information has been reviewed and is accurate.   Pt comes in with cc of dib.  Pt has past medical history of COPD  on home O2 3 L 24 hours/day; Hypertension; Anxiety; Depression; Arthritis; Thoraco-abdominal aneurysm - under surveillance only; Neuropathy; Leg swelling; Oxygen dependent; Myocardial infarction; Pneumonia; and Stroke. She comes in with chest pain - midsternal, present for several days, unchanged, non raidating. The pain is maybe worse with inspiration. Recent bout of PNA, on levo currently. Exam is clear. Pulse are equal on radial artery bilaterally, no neuro complains, labs are unremarkable, improving lung CXR. I think pt is stable to go home. I think some of the weakness is related to deconditioning of the body and recent infection, but pt is able to manage things at home, she has a home aide as well. Will d.c.   Melanie Kaplan, MD 04/15/15 0140  Melanie Kaplan, MD 04/15/15 0230

## 2015-04-15 NOTE — ED Notes (Signed)
Patient assisted to restroom, tolerated well 

## 2015-04-15 NOTE — ED Notes (Signed)
Called pt daughter in law, stated they were 10 minutes away. Had been "caught in traffic".

## 2015-04-17 LAB — CULTURE, BLOOD (ROUTINE X 2)
Culture: NO GROWTH
Culture: NO GROWTH

## 2015-06-21 ENCOUNTER — Emergency Department (HOSPITAL_COMMUNITY): Payer: Medicare HMO

## 2015-06-21 ENCOUNTER — Encounter (HOSPITAL_COMMUNITY): Payer: Self-pay | Admitting: *Deleted

## 2015-06-21 ENCOUNTER — Emergency Department (HOSPITAL_COMMUNITY)
Admission: EM | Admit: 2015-06-21 | Discharge: 2015-06-21 | Disposition: A | Discharge status: Home / Self Care | Payer: Medicare HMO | Attending: Emergency Medicine | Admitting: Emergency Medicine

## 2015-06-21 DIAGNOSIS — Z79899 Other long term (current) drug therapy: Secondary | ICD-10-CM | POA: Insufficient documentation

## 2015-06-21 DIAGNOSIS — F329 Major depressive disorder, single episode, unspecified: Secondary | ICD-10-CM | POA: Insufficient documentation

## 2015-06-21 DIAGNOSIS — F419 Anxiety disorder, unspecified: Secondary | ICD-10-CM | POA: Diagnosis not present

## 2015-06-21 DIAGNOSIS — Z8673 Personal history of transient ischemic attack (TIA), and cerebral infarction without residual deficits: Secondary | ICD-10-CM | POA: Diagnosis not present

## 2015-06-21 DIAGNOSIS — Z87891 Personal history of nicotine dependence: Secondary | ICD-10-CM | POA: Diagnosis not present

## 2015-06-21 DIAGNOSIS — Z8701 Personal history of pneumonia (recurrent): Secondary | ICD-10-CM | POA: Diagnosis not present

## 2015-06-21 DIAGNOSIS — J449 Chronic obstructive pulmonary disease, unspecified: Secondary | ICD-10-CM | POA: Diagnosis not present

## 2015-06-21 DIAGNOSIS — I10 Essential (primary) hypertension: Secondary | ICD-10-CM | POA: Diagnosis not present

## 2015-06-21 DIAGNOSIS — Z9981 Dependence on supplemental oxygen: Secondary | ICD-10-CM | POA: Insufficient documentation

## 2015-06-21 DIAGNOSIS — Z7951 Long term (current) use of inhaled steroids: Secondary | ICD-10-CM | POA: Diagnosis not present

## 2015-06-21 DIAGNOSIS — M5442 Lumbago with sciatica, left side: Secondary | ICD-10-CM | POA: Diagnosis not present

## 2015-06-21 DIAGNOSIS — M549 Dorsalgia, unspecified: Secondary | ICD-10-CM | POA: Diagnosis present

## 2015-06-21 DIAGNOSIS — G629 Polyneuropathy, unspecified: Secondary | ICD-10-CM | POA: Diagnosis not present

## 2015-06-21 DIAGNOSIS — Z7982 Long term (current) use of aspirin: Secondary | ICD-10-CM | POA: Insufficient documentation

## 2015-06-21 DIAGNOSIS — I252 Old myocardial infarction: Secondary | ICD-10-CM | POA: Insufficient documentation

## 2015-06-21 DIAGNOSIS — M1990 Unspecified osteoarthritis, unspecified site: Secondary | ICD-10-CM | POA: Diagnosis not present

## 2015-06-21 MED ORDER — MORPHINE SULFATE 4 MG/ML IJ SOLN
4.0000 mg | INTRAMUSCULAR | Status: DC | PRN
  Administered 2015-06-21: 4 mg via INTRAVENOUS
  Filled 2015-06-21: qty 1

## 2015-06-21 MED ORDER — OXYCODONE-ACETAMINOPHEN 5-325 MG PO TABS
1.0000 | ORAL_TABLET | Freq: Four times a day (QID) | ORAL | Status: DC | PRN
Start: 2015-06-21 — End: 2015-09-12

## 2015-06-21 NOTE — ED Notes (Signed)
Patient with no complaints at this time. Respirations even and unlabored. Skin warm/dry. Discharge instructions reviewed with patient at this time. Patient given opportunity to voice concerns/ask questions. IV removed per policy and band-aid applied to site. Patient discharged at this time and left Emergency Department with steady gait.  

## 2015-06-21 NOTE — ED Provider Notes (Signed)
CSN: [id number]     Arrival date & time 06/21/15  1435 History   First MD Initiated Contact with Patient 06/21/15 1549     Chief Complaint  Patient presents with  . Back Pain     (Consider location/radiation/quality/duration/timing/severity/associated sxs/prior Treatment) HPI.... Low back pain with radiation to left buttocks and left leg for several days. No bowel or bladder incontinence. She has a known large (7.5 cm) abdominal aortic aneurysm which is not operable. Patient has tried her normal pain medications at home with minimal relief. She is able to walk but sparingly. Severity is moderate.  Past Medical History  Diagnosis Date  . COPD (chronic obstructive pulmonary disease)   . Hypertension   . Anxiety   . Depression   . Arthritis   . AAA (abdominal aortic aneurysm)   . Neuropathy   . Leg swelling   . Oxygen dependent   . Myocardial infarction   . Pneumonia   . Stroke    Past Surgical History  Procedure Laterality Date  . Cholecystectomy    . Appendectomy     Family History  Problem Relation Age of Onset  . Diabetes Brother   . Cancer Brother     Throat cancer, Heart attack  . Heart disease Brother   . Heart attack Brother   . Cancer Father   . Cancer Sister    History  Substance Use Topics  . Smoking status: Former Smoker -- 50 years    Types: Cigarettes    Quit date: 12/31/2010  . Smokeless tobacco: Former Neurosurgeon    Quit date: 12/31/2010  . Alcohol Use: No   OB History    No data available     Review of Systems  All other systems reviewed and are negative.     Allergies  Nsaids; Other; and Tomato  Home Medications   Prior to Admission medications   Medication Sig Start Date End Date Taking? Authorizing Provider  acetaminophen (TYLENOL) 500 MG tablet Take 250-500 mg by mouth daily as needed for mild pain or moderate pain.    Yes Historical Provider, MD  albuterol (PROVENTIL) (2.5 MG/3ML) 0.083% nebulizer solution Take 2.5 mg by nebulization 3  (three) times daily.    Yes Historical Provider, MD  aspirin EC 81 MG tablet Take 81 mg by mouth daily.   Yes Historical Provider, MD  budesonide-formoterol (SYMBICORT) 160-4.5 MCG/ACT inhaler Inhale 2 puffs into the lungs 2 (two) times daily.    Yes Historical Provider, MD  citalopram (CELEXA) 20 MG tablet Take 20 mg by mouth at bedtime.    Yes Historical Provider, MD  diazepam (VALIUM) 10 MG tablet Take 10 mg by mouth 3 (three) times daily as needed for anxiety.   Yes Historical Provider, MD  gabapentin (NEURONTIN) 100 MG tablet Take 100 mg by mouth 5 (five) times daily.    Yes Historical Provider, MD  HYDROcodone-acetaminophen (NORCO) 7.5-325 MG per tablet Take 1 tablet by mouth every 6 (six) hours as needed for moderate pain. Patient taking differently: Take 1 tablet by mouth at bedtime.  09/05/14  Yes Gerhard Munch, MD  tiotropium (SPIRIVA) 18 MCG inhalation capsule Place 18 mcg into inhaler and inhale every evening.    Yes Historical Provider, MD  torsemide (DEMADEX) 20 MG tablet Take 20 mg by mouth every morning.    Yes Historical Provider, MD  ondansetron (ZOFRAN ODT) 4 MG disintegrating tablet Take 1 tablet (4 mg total) by mouth every 8 (eight) hours as needed. Patient not taking:  Reported on 04/11/2015 11/22/14   [name], MD  oxyCODONE-acetaminophen (PERCOCET/ROXICET) 5-325 MG per tablet Take 1 tablet by mouth every 6 (six) hours as needed. 06/21/15   Donnetta Hutching, MD   BP 129/83 mmHg  Pulse 58  Temp(Src) 98.4 F (36.9 C) (Oral)  Resp 16  SpO2 93% Physical Exam  Constitutional: She is oriented to person, place, and time. She appears well-developed and well-nourished.  HENT:  Head: Normocephalic and atraumatic.  Eyes: Conjunctivae and EOM are normal. Pupils are equal, round, and reactive to light.  Neck: Normal range of motion. Neck supple.  Cardiovascular: Normal rate and regular rhythm.   Pulmonary/Chest: Effort normal and breath sounds normal.  Abdominal: Soft. Bowel  sounds are normal.  Musculoskeletal:  Paraspinous tenderness in the lower lumbar spine.  Pain with straight leg raise left greater than right  Neurological: She is alert and oriented to person, place, and time.  Skin: Skin is warm and dry.  Psychiatric: She has a normal mood and affect. Her behavior is normal.  Nursing note and vitals reviewed.   ED Course  Procedures (including critical care time) Labs Review Labs Reviewed - No data to display  Imaging Review Dg Lumbar Spine Complete  06/21/2015   CLINICAL DATA:  Back pain radiating into the bilateral lower extremities for several months.  EXAM: LUMBAR SPINE - COMPLETE 4+ VIEW  COMPARISON:  Abdominal CT 11/22/2014  FINDINGS: A large saccular abdominal aortic aneurysm is visible, projected to the left. Direct comparison not reliably possible on this study due to magnification and differences in projection. Maximal (craniocaudal) dimensions today is approximately 9 cm.  No evidence of acute fracture. No suspicious endplate erosion (sclerosis and subchondral cystic change around the L1-2 disc is stable from 2015). There is diffuse degenerative disc disease with narrowing and endplate spurring. Grade 1 anterolisthesis present at L3-4 and L4-5, stable from prior. On CT 11/22/2014, there was advanced spinal canal stenosis from slip and posterior element overgrowth  IMPRESSION: 1. No acute findings. 2. Degenerative disc and facet disease with L3-4 and L4-5 anterolisthesis. Spinal canal stenosis at L3-4 is advanced based on 11/22/2014 abdominal CT. 3. Known, large saccular abdominal aortic aneurysm, as above.   Electronically Signed   By: Marnee Spring M.D.   On: 06/21/2015 17:08     EKG Interpretation None      MDM   Final diagnoses:  Midline low back pain with left-sided sciatica    Plain films of lumbar spine show no acute findings. Family and patient are aware of her large abdominal aortic aneurysm. Will treat patient's pain. Discharge  medication Percocet. Patient has primary care follow-up.    Donnetta Hutching, MD 06/21/15 Windell Moment

## 2015-06-21 NOTE — Discharge Instructions (Signed)
X-ray of lower back showed no acute findings. Prescription for pain medicine. Follow-up your primary care doctor. Aneurysm is still present.

## 2015-06-21 NOTE — ED Notes (Signed)
3 day history of increasing lumbar pain w/radiation down buttock into posterior L leg to knee.  Patient reports she has was seen recently at [facility] and given an injection in her hip.  The pain began after that, to the point she could hardly walk upon arrival home.  Positive L straight leg raise.  CMS intact bilateral LE.  Denies numbness/tingling or loss of bladder or bowel control.

## 2015-07-01 ENCOUNTER — Emergency Department (HOSPITAL_COMMUNITY): Payer: Medicare HMO

## 2015-07-01 ENCOUNTER — Emergency Department (HOSPITAL_COMMUNITY)
Admission: EM | Admit: 2015-07-01 | Discharge: 2015-07-01 | Disposition: A | Discharge status: Home / Self Care | Payer: Medicare HMO | Attending: Emergency Medicine | Admitting: Emergency Medicine

## 2015-07-01 ENCOUNTER — Encounter (HOSPITAL_COMMUNITY): Payer: Self-pay | Admitting: Emergency Medicine

## 2015-07-01 DIAGNOSIS — M1990 Unspecified osteoarthritis, unspecified site: Secondary | ICD-10-CM | POA: Diagnosis not present

## 2015-07-01 DIAGNOSIS — R52 Pain, unspecified: Secondary | ICD-10-CM

## 2015-07-01 DIAGNOSIS — R1032 Left lower quadrant pain: Secondary | ICD-10-CM | POA: Insufficient documentation

## 2015-07-01 DIAGNOSIS — R109 Unspecified abdominal pain: Secondary | ICD-10-CM

## 2015-07-01 DIAGNOSIS — G629 Polyneuropathy, unspecified: Secondary | ICD-10-CM | POA: Insufficient documentation

## 2015-07-01 DIAGNOSIS — Z9981 Dependence on supplemental oxygen: Secondary | ICD-10-CM | POA: Insufficient documentation

## 2015-07-01 DIAGNOSIS — R200 Anesthesia of skin: Secondary | ICD-10-CM | POA: Insufficient documentation

## 2015-07-01 DIAGNOSIS — Z8701 Personal history of pneumonia (recurrent): Secondary | ICD-10-CM | POA: Diagnosis not present

## 2015-07-01 DIAGNOSIS — R1084 Generalized abdominal pain: Secondary | ICD-10-CM | POA: Diagnosis present

## 2015-07-01 DIAGNOSIS — Z9049 Acquired absence of other specified parts of digestive tract: Secondary | ICD-10-CM | POA: Diagnosis not present

## 2015-07-01 DIAGNOSIS — I10 Essential (primary) hypertension: Secondary | ICD-10-CM | POA: Insufficient documentation

## 2015-07-01 DIAGNOSIS — F419 Anxiety disorder, unspecified: Secondary | ICD-10-CM | POA: Insufficient documentation

## 2015-07-01 DIAGNOSIS — Z8673 Personal history of transient ischemic attack (TIA), and cerebral infarction without residual deficits: Secondary | ICD-10-CM | POA: Diagnosis not present

## 2015-07-01 DIAGNOSIS — F329 Major depressive disorder, single episode, unspecified: Secondary | ICD-10-CM | POA: Diagnosis not present

## 2015-07-01 DIAGNOSIS — R4789 Other speech disturbances: Secondary | ICD-10-CM | POA: Insufficient documentation

## 2015-07-01 DIAGNOSIS — Z7982 Long term (current) use of aspirin: Secondary | ICD-10-CM | POA: Diagnosis not present

## 2015-07-01 DIAGNOSIS — I252 Old myocardial infarction: Secondary | ICD-10-CM | POA: Insufficient documentation

## 2015-07-01 DIAGNOSIS — J449 Chronic obstructive pulmonary disease, unspecified: Secondary | ICD-10-CM | POA: Insufficient documentation

## 2015-07-01 DIAGNOSIS — R1031 Right lower quadrant pain: Secondary | ICD-10-CM | POA: Diagnosis not present

## 2015-07-01 DIAGNOSIS — Z79899 Other long term (current) drug therapy: Secondary | ICD-10-CM | POA: Insufficient documentation

## 2015-07-01 DIAGNOSIS — Z7951 Long term (current) use of inhaled steroids: Secondary | ICD-10-CM | POA: Insufficient documentation

## 2015-07-01 LAB — CBC WITH DIFFERENTIAL/PLATELET
Basophils Absolute: 0 10*3/uL (ref 0.0–0.1)
Basophils Relative: 0 % (ref 0–1)
Eosinophils Absolute: 0.2 10*3/uL (ref 0.0–0.7)
Eosinophils Relative: 3 % (ref 0–5)
HEMATOCRIT: 34.6 % — AB (ref 36.0–46.0)
HEMOGLOBIN: 11.6 g/dL — AB (ref 12.0–15.0)
LYMPHS ABS: 2.3 10*3/uL (ref 0.7–4.0)
Lymphocytes Relative: 41 % (ref 12–46)
MCH: 31.6 pg (ref 26.0–34.0)
MCHC: 33.5 g/dL (ref 30.0–36.0)
MCV: 94.3 fL (ref 78.0–100.0)
MONO ABS: 0.5 10*3/uL (ref 0.1–1.0)
Monocytes Relative: 8 % (ref 3–12)
Neutro Abs: 2.7 10*3/uL (ref 1.7–7.7)
Neutrophils Relative %: 48 % (ref 43–77)
Platelets: 209 10*3/uL (ref 150–400)
RBC: 3.67 MIL/uL — AB (ref 3.87–5.11)
RDW: 14.2 % (ref 11.5–15.5)
WBC: 5.6 10*3/uL (ref 4.0–10.5)

## 2015-07-01 LAB — COMPREHENSIVE METABOLIC PANEL
ALT: 11 U/L — ABNORMAL LOW (ref 14–54)
AST: 17 U/L (ref 15–41)
Albumin: 3.7 g/dL (ref 3.5–5.0)
Alkaline Phosphatase: 76 U/L (ref 38–126)
Anion gap: 7 (ref 5–15)
BUN: 28 mg/dL — ABNORMAL HIGH (ref 6–20)
CALCIUM: 8.8 mg/dL — AB (ref 8.9–10.3)
CO2: 27 mmol/L (ref 22–32)
Chloride: 101 mmol/L (ref 101–111)
Creatinine, Ser: 1.17 mg/dL — ABNORMAL HIGH (ref 0.44–1.00)
GFR calc Af Amer: 46 mL/min — ABNORMAL LOW (ref >60)
GFR calc non Af Amer: 40 mL/min — ABNORMAL LOW (ref >60)
Glucose, Bld: 125 mg/dL — ABNORMAL HIGH (ref 65–99)
POTASSIUM: 3.8 mmol/L (ref 3.5–5.1)
Sodium: 135 mmol/L (ref 135–145)
TOTAL PROTEIN: 6.5 g/dL (ref 6.5–8.1)
Total Bilirubin: 0.5 mg/dL (ref 0.3–1.2)

## 2015-07-01 LAB — I-STAT CHEM 8, ED
BUN: 27 mg/dL — AB (ref 6–20)
CHLORIDE: 102 mmol/L (ref 101–111)
Calcium, Ion: 1.2 mmol/L (ref 1.13–1.30)
Creatinine, Ser: 1.2 mg/dL — ABNORMAL HIGH (ref 0.44–1.00)
GLUCOSE: 118 mg/dL — AB (ref 65–99)
HCT: 36 % (ref 36.0–46.0)
Hemoglobin: 12.2 g/dL (ref 12.0–15.0)
Potassium: 3.8 mmol/L (ref 3.5–5.1)
Sodium: 138 mmol/L (ref 135–145)
TCO2: 24 mmol/L (ref 0–100)

## 2015-07-01 MED ORDER — HYDROCODONE-ACETAMINOPHEN 5-325 MG PO TABS
2.0000 | ORAL_TABLET | Freq: Once | ORAL | Status: AC
  Administered 2015-07-01: 2 via ORAL
  Filled 2015-07-01: qty 2

## 2015-07-01 MED ORDER — IOHEXOL 350 MG/ML SOLN
100.0000 mL | Freq: Once | INTRAVENOUS | Status: AC | PRN
  Administered 2015-07-01: 80 mL via INTRAVENOUS

## 2015-07-01 MED ORDER — SODIUM CHLORIDE 0.9 % IJ SOLN
INTRAMUSCULAR | Status: AC
  Filled 2015-07-01: qty 500

## 2015-07-01 MED ORDER — SODIUM CHLORIDE 0.9 % IV SOLN
INTRAVENOUS | Status: AC
  Filled 2015-07-01: qty 500

## 2015-07-01 MED ORDER — SODIUM CHLORIDE 0.9 % IV BOLUS (SEPSIS)
500.0000 mL | Freq: Once | INTRAVENOUS | Status: AC
  Administered 2015-07-01: 500 mL via INTRAVENOUS

## 2015-07-01 NOTE — Discharge Instructions (Signed)
Take your pain medicine 4 times a day if needed

## 2015-07-01 NOTE — ED Provider Notes (Signed)
CSN: [id number]     Arrival date & time 07/01/15  2010 History  This chart was scribed for [name], MD by Tanda Rockers, ED Scribe. This patient was seen in room APA12/[facility]12 and the patient's care was started at 8:36 PM.  Chief Complaint  Patient presents with  . Abdominal Pain   Patient is a 81 y.o. female presenting with abdominal pain. The history is provided by the patient. No language interpreter was used.  Abdominal Pain Pain location:  Generalized Pain radiates to:  Does not radiate Pain severity:  Severe Onset quality:  Sudden Timing:  Unable to specify Progression:  Unable to specify Relieved by:  None tried Worsened by:  Nothing tried Ineffective treatments:  None tried Associated symptoms: no chest pain, no cough, no diarrhea, no fatigue, no hematuria, no nausea and no vomiting   Risk factors: being elderly    HPI Comments: KERAH HARDEBECK is a 81 y.o. female brought in by ambulance, with hx inoperable AAA who presents to the Emergency Department complaining of sudden onset, severe, diffuse abdominal pain that began PTA. Family mentions that pt was complaining of abdominal pain and was staring off into the distance. Family moved pt to the bed and reports that pt was having difficulty speaking. Pt complained that she could not feel her legs, prompting them to call EMS. Denies nausea, vomiting, or any other symptoms.   Past Medical History  Diagnosis Date  . COPD (chronic obstructive pulmonary disease)   . Hypertension   . Anxiety   . Depression   . Arthritis   . AAA (abdominal aortic aneurysm)   . Neuropathy   . Leg swelling   . Oxygen dependent   . Myocardial infarction   . Pneumonia   . Stroke    Past Surgical History  Procedure Laterality Date  . Cholecystectomy    . Appendectomy     Family History  Problem Relation Age of Onset  . Diabetes Brother   . Cancer Brother     Throat cancer, Heart attack  . Heart disease Brother   . Heart attack Brother   .  Cancer Father   . Cancer Sister    History  Substance Use Topics  . Smoking status: Former Smoker -- 50 years    Types: Cigarettes    Quit date: 12/31/2010  . Smokeless tobacco: Former Neurosurgeon    Quit date: 12/31/2010  . Alcohol Use: No   OB History    No data available     Review of Systems  Constitutional: Negative for appetite change and fatigue.  HENT: Negative for congestion, ear discharge and sinus pressure.   Eyes: Negative for discharge.  Respiratory: Negative for cough.   Cardiovascular: Negative for chest pain.  Gastrointestinal: Positive for abdominal pain. Negative for nausea, vomiting and diarrhea.  Genitourinary: Negative for frequency and hematuria.  Musculoskeletal: Negative for back pain.  Skin: Negative for rash.  Neurological: Positive for speech difficulty and numbness (Lower extremities). Negative for seizures and headaches.  Psychiatric/Behavioral: Negative for hallucinations.   Allergies  Nsaids; Other; and Tomato  Home Medications   Prior to Admission medications   Medication Sig Start Date End Date Taking? Authorizing Provider  acetaminophen (TYLENOL) 500 MG tablet Take 250-500 mg by mouth daily as needed for mild pain or moderate pain.    Yes Historical Provider, MD  albuterol (PROVENTIL) (2.5 MG/3ML) 0.083% nebulizer solution Take 2.5 mg by nebulization 3 (three) times daily.    Yes Historical Provider, MD  aspirin EC 81 MG tablet Take 81 mg by mouth daily.   Yes Historical Provider, MD  budesonide-formoterol (SYMBICORT) 160-4.5 MCG/ACT inhaler Inhale 2 puffs into the lungs 2 (two) times daily.    Yes Historical Provider, MD  citalopram (CELEXA) 20 MG tablet Take 20 mg by mouth at bedtime.    Yes Historical Provider, MD  diazepam (VALIUM) 10 MG tablet Take 10 mg by mouth 3 (three) times daily as needed for anxiety.   Yes Historical Provider, MD  gabapentin (NEURONTIN) 100 MG tablet Take 100 mg by mouth 5 (five) times daily.    Yes Historical Provider,  MD  HYDROcodone-acetaminophen (NORCO) 7.5-325 MG per tablet Take 1 tablet by mouth every 6 (six) hours as needed for moderate pain. Patient taking differently: Take 1 tablet by mouth at bedtime.  09/05/14  Yes Gerhard Munch, MD  tiotropium (SPIRIVA) 18 MCG inhalation capsule Place 18 mcg into inhaler and inhale every evening.    Yes Historical Provider, MD  torsemide (DEMADEX) 20 MG tablet Take 20 mg by mouth every morning.    Yes Historical Provider, MD  oxyCODONE-acetaminophen (PERCOCET/ROXICET) 5-325 MG per tablet Take 1 tablet by mouth every 6 (six) hours as needed. Patient not taking: Reported on 07/01/2015 06/21/15   Donnetta Hutching, MD   Triage Vitals: BP 123/69 mmHg  Pulse 64  Temp(Src) 98.6 F (37 C) (Oral)  Resp 18  Ht  (1.549 m)  Wt 129 lb (58.514 kg)  BMI 24.39 kg/m2  SpO2 93%   Physical Exam  Constitutional: She is oriented to person, place, and time. She appears well-developed.  HENT:  Head: Normocephalic.  Eyes: Conjunctivae and EOM are normal. No scleral icterus.  Neck: Neck supple. No thyromegaly present.  Cardiovascular: Normal rate and regular rhythm.  Exam reveals no gallop and no friction rub.   No murmur heard. Pulmonary/Chest: No stridor. She has no wheezes. She has no rales. She exhibits no tenderness.  Abdominal: She exhibits no distension. There is tenderness in the right lower quadrant and left lower quadrant. There is no rebound.  Musculoskeletal: Normal range of motion. She exhibits no edema.  Lymphadenopathy:    She has no cervical adenopathy.  Neurological: She is oriented to person, place, and time. She exhibits normal muscle tone. Coordination normal.  Skin: No rash noted. No erythema.  Psychiatric: She has a normal mood and affect. Her behavior is normal.    ED Course  Procedures (including critical care time)  DIAGNOSTIC STUDIES: Oxygen Saturation is 93% on RA, low by my interpretation.    COORDINATION OF CARE: 8:40 PM-Discussed treatment  plan which includes CBC, CMP, Chem 8, CTA Abdomen/Pelvis with pt at bedside and pt agreed to plan.   Labs Review Labs Reviewed  CBC WITH DIFFERENTIAL/PLATELET - Abnormal; Notable for the following:    RBC 3.67 (*)    Hemoglobin 11.6 (*)    HCT 34.6 (*)    All other components within normal limits  COMPREHENSIVE METABOLIC PANEL - Abnormal; Notable for the following:    Glucose, Bld 125 (*)    BUN 28 (*)    Creatinine, Ser 1.17 (*)    Calcium 8.8 (*)    ALT 11 (*)    GFR calc non Af Amer 40 (*)    GFR calc Af Amer 46 (*)    All other components within normal limits  I-STAT CHEM 8, ED - Abnormal; Notable for the following:    BUN 27 (*)    Creatinine, Ser 1.20 (*)  Glucose, Bld 118 (*)    All other components within normal limits    Imaging Review Ct Angio Chest Pe W/cm &/or Wo Cm  07/01/2015   CLINICAL DATA:  81 year old female with known abdominal aortic aneurysm presenting with shortness of breath.  EXAM: CT ANGIOGRAPHY CHEST, ABDOMEN AND PELVIS  TECHNIQUE: Multidetector CT imaging through the chest, abdomen and pelvis was performed using the standard protocol during bolus administration of intravenous contrast. Multiplanar reconstructed images and MIPs were obtained and reviewed to evaluate the vascular anatomy.  CONTRAST:  80mL OMNIPAQUE IOHEXOL 350 MG/ML SOLN  COMPARISON:  CT dated 11/22/2014  FINDINGS: CTA CHEST FINDINGS  Lungs, pleural spaces, and central airways: Emphysema. No focal consolidation. Right lung base vascular crowding likely atelectatic changes/ scarring. No pleural effusion or pneumothorax. The central airways are patent. A subcentimeter nodular appearing density extending into the posterior upper trachea at the level of the T1 vertebral (series 5 image 15) is not well characterized but likely represents mucus secretion .  Vasculature: There is stable mild dilatation of the ascending aorta measuring up to 4.4 cm in diameter. There is atherosclerotic calcification of  the thoracic aorta. No CT evidence of pulmonary embolism. There is coronary vascular calcification.  Heart: No cardiomegaly. No pericardial effusion.  Mediastinum/Lymph nodes: Unremarkable. No mass. No adenopathy .  Review of the MIP images confirms the above findings.  CTA ABDOMEN AND PELVIS FINDINGS  No intra-abdominal free air or free fluid.  Subcentimeter left hepatic calcific focus likely related to old inflammation. Cholecystectomy. The pancreas, spleen, right adrenal gland, kidneys, visualized ureters, and the urinary bladder appear unremarkable. A 2 cm left adrenal nodule, incompletely characterized, likely an adenoma. The uterus is grossly unremarkable.  Sigmoid diverticulosis without acute inflammatory changes. Constipation. No bowel obstruction. The appendix is not visualized with certainty. No inflammatory changes identified in the right lower quadrant.  There is aortoiliac atherosclerotic disease. There is a partially thrombosed infrarenal abdominal aortic aneurysm measuring 7.5 cm in greatest axial dimension and 8 cm in craniocaudal length (previously 7.2 x 7.7 cm). No definite acute fracture identified. However, evaluation is limited in the absence of noncontrast images. The origins of the celiac axis, SMA and the IMA as well as the origins of the renal arteries remain patent. No lymphadenopathy.  Small bilateral fat containing inguinal hernias. Scoliosis with degenerative changes of the spine. No acute fracture.  Review of the MIP images confirms the above findings.  IMPRESSION: A 7.5 cm partially thrombosed infrarenal abdominal aortic aneurysm slightly increased in size compared to the prior study. No evidence of rupture or acute dissection.  No CT evidence of pulmonary embolism.   Electronically Signed   By: Elgie Collard M.D.   On: 07/01/2015 22:24   Ct Cta Abd/pel W/cm &/or W/o Cm  07/01/2015   CLINICAL DATA:  81 year old female with known abdominal aortic aneurysm presenting with shortness  of breath.  EXAM: CT ANGIOGRAPHY CHEST, ABDOMEN AND PELVIS  TECHNIQUE: Multidetector CT imaging through the chest, abdomen and pelvis was performed using the standard protocol during bolus administration of intravenous contrast. Multiplanar reconstructed images and MIPs were obtained and reviewed to evaluate the vascular anatomy.  CONTRAST:  80mL OMNIPAQUE IOHEXOL 350 MG/ML SOLN  COMPARISON:  CT dated 11/22/2014  FINDINGS: CTA CHEST FINDINGS  Lungs, pleural spaces, and central airways: Emphysema. No focal consolidation. Right lung base vascular crowding likely atelectatic changes/ scarring. No pleural effusion or pneumothorax. The central airways are patent. A subcentimeter nodular appearing density extending into the  posterior upper trachea at the level of the T1 vertebral (series 5 image 15) is not well characterized but likely represents mucus secretion .  Vasculature: There is stable mild dilatation of the ascending aorta measuring up to 4.4 cm in diameter. There is atherosclerotic calcification of the thoracic aorta. No CT evidence of pulmonary embolism. There is coronary vascular calcification.  Heart: No cardiomegaly. No pericardial effusion.  Mediastinum/Lymph nodes: Unremarkable. No mass. No adenopathy .  Review of the MIP images confirms the above findings.  CTA ABDOMEN AND PELVIS FINDINGS  No intra-abdominal free air or free fluid.  Subcentimeter left hepatic calcific focus likely related to old inflammation. Cholecystectomy. The pancreas, spleen, right adrenal gland, kidneys, visualized ureters, and the urinary bladder appear unremarkable. A 2 cm left adrenal nodule, incompletely characterized, likely an adenoma. The uterus is grossly unremarkable.  Sigmoid diverticulosis without acute inflammatory changes. Constipation. No bowel obstruction. The appendix is not visualized with certainty. No inflammatory changes identified in the right lower quadrant.  There is aortoiliac atherosclerotic disease. There  is a partially thrombosed infrarenal abdominal aortic aneurysm measuring 7.5 cm in greatest axial dimension and 8 cm in craniocaudal length (previously 7.2 x 7.7 cm). No definite acute fracture identified. However, evaluation is limited in the absence of noncontrast images. The origins of the celiac axis, SMA and the IMA as well as the origins of the renal arteries remain patent. No lymphadenopathy.  Small bilateral fat containing inguinal hernias. Scoliosis with degenerative changes of the spine. No acute fracture.  Review of the MIP images confirms the above findings.  IMPRESSION: A 7.5 cm partially thrombosed infrarenal abdominal aortic aneurysm slightly increased in size compared to the prior study. No evidence of rupture or acute dissection.  No CT evidence of pulmonary embolism.   Electronically Signed   By: Elgie Collard M.D.   On: 07/01/2015 22:24     EKG Interpretation None      MDM   Final diagnoses:  Pain   Pt with abd pain and inoperable AAA,  She is a DNR.  Was on Hospice before.  She had lower abd pain but had not taken her pain meds today.   Ct showed AAA slightly enlarged.  Pt had minimal pain at D/C.   abd pain chronic and 2nd to AAA,   Pt instructed to take her pain meds as prescribed and follow up with her md next week.  The chart was scribed for me under my direct supervision.  I personally performed the history, physical, and medical decision making and all procedures in the evaluation of this patient.[name], MD 07/02/15 [phone]

## 2015-07-01 NOTE — ED Notes (Signed)
EMS called out for abdo pain and sob ( pt is on O2) hx of inoperable abdo aortic aneurism-

## 2015-09-06 ENCOUNTER — Emergency Department (HOSPITAL_COMMUNITY)
Admission: EM | Admit: 2015-09-06 | Discharge: 2015-09-07 | Disposition: A | Discharge status: Home / Self Care | Payer: Commercial Managed Care - HMO | Attending: Emergency Medicine | Admitting: Emergency Medicine

## 2015-09-06 ENCOUNTER — Encounter (HOSPITAL_COMMUNITY): Payer: Self-pay | Admitting: *Deleted

## 2015-09-06 ENCOUNTER — Emergency Department (HOSPITAL_COMMUNITY): Payer: Commercial Managed Care - HMO

## 2015-09-06 DIAGNOSIS — Z7982 Long term (current) use of aspirin: Secondary | ICD-10-CM | POA: Insufficient documentation

## 2015-09-06 DIAGNOSIS — J441 Chronic obstructive pulmonary disease with (acute) exacerbation: Secondary | ICD-10-CM | POA: Diagnosis not present

## 2015-09-06 DIAGNOSIS — J439 Emphysema, unspecified: Secondary | ICD-10-CM | POA: Diagnosis not present

## 2015-09-06 DIAGNOSIS — Z9981 Dependence on supplemental oxygen: Secondary | ICD-10-CM | POA: Diagnosis not present

## 2015-09-06 DIAGNOSIS — Z8701 Personal history of pneumonia (recurrent): Secondary | ICD-10-CM | POA: Insufficient documentation

## 2015-09-06 DIAGNOSIS — R58 Hemorrhage, not elsewhere classified: Secondary | ICD-10-CM | POA: Diagnosis not present

## 2015-09-06 DIAGNOSIS — M1990 Unspecified osteoarthritis, unspecified site: Secondary | ICD-10-CM | POA: Diagnosis not present

## 2015-09-06 DIAGNOSIS — I252 Old myocardial infarction: Secondary | ICD-10-CM | POA: Diagnosis not present

## 2015-09-06 DIAGNOSIS — M7989 Other specified soft tissue disorders: Secondary | ICD-10-CM | POA: Insufficient documentation

## 2015-09-06 DIAGNOSIS — F419 Anxiety disorder, unspecified: Secondary | ICD-10-CM | POA: Insufficient documentation

## 2015-09-06 DIAGNOSIS — R2243 Localized swelling, mass and lump, lower limb, bilateral: Secondary | ICD-10-CM | POA: Insufficient documentation

## 2015-09-06 DIAGNOSIS — I10 Essential (primary) hypertension: Secondary | ICD-10-CM | POA: Insufficient documentation

## 2015-09-06 DIAGNOSIS — F329 Major depressive disorder, single episode, unspecified: Secondary | ICD-10-CM | POA: Diagnosis not present

## 2015-09-06 DIAGNOSIS — Z8673 Personal history of transient ischemic attack (TIA), and cerebral infarction without residual deficits: Secondary | ICD-10-CM | POA: Diagnosis not present

## 2015-09-06 DIAGNOSIS — Z87891 Personal history of nicotine dependence: Secondary | ICD-10-CM | POA: Diagnosis not present

## 2015-09-06 DIAGNOSIS — Z7951 Long term (current) use of inhaled steroids: Secondary | ICD-10-CM | POA: Diagnosis not present

## 2015-09-06 DIAGNOSIS — Z79899 Other long term (current) drug therapy: Secondary | ICD-10-CM | POA: Diagnosis not present

## 2015-09-06 LAB — CBC WITH DIFFERENTIAL/PLATELET
BASOS ABS: 0 10*3/uL (ref 0.0–0.1)
Basophils Relative: 0 % (ref 0–1)
Eosinophils Absolute: 0.2 10*3/uL (ref 0.0–0.7)
Eosinophils Relative: 5 % (ref 0–5)
HCT: 35.8 % — ABNORMAL LOW (ref 36.0–46.0)
HEMOGLOBIN: 12.1 g/dL (ref 12.0–15.0)
LYMPHS ABS: 1.8 10*3/uL (ref 0.7–4.0)
LYMPHS PCT: 36 % (ref 12–46)
MCH: 32.1 pg (ref 26.0–34.0)
MCHC: 33.8 g/dL (ref 30.0–36.0)
MCV: 95 fL (ref 78.0–100.0)
Monocytes Absolute: 0.4 10*3/uL (ref 0.1–1.0)
Monocytes Relative: 8 % (ref 3–12)
NEUTROS ABS: 2.5 10*3/uL (ref 1.7–7.7)
NEUTROS PCT: 51 % (ref 43–77)
PLATELETS: 210 10*3/uL (ref 150–400)
RBC: 3.77 MIL/uL — AB (ref 3.87–5.11)
RDW: 13.7 % (ref 11.5–15.5)
WBC: 4.9 10*3/uL (ref 4.0–10.5)

## 2015-09-06 LAB — BASIC METABOLIC PANEL
ANION GAP: 3 — AB (ref 5–15)
BUN: 21 mg/dL — AB (ref 6–20)
CHLORIDE: 108 mmol/L (ref 101–111)
CO2: 30 mmol/L (ref 22–32)
Calcium: 9.2 mg/dL (ref 8.9–10.3)
Creatinine, Ser: 1.15 mg/dL — ABNORMAL HIGH (ref 0.44–1.00)
GFR, EST AFRICAN AMERICAN: 47 mL/min — AB (ref >60)
GFR, EST NON AFRICAN AMERICAN: 41 mL/min — AB (ref >60)
Glucose, Bld: 134 mg/dL — ABNORMAL HIGH (ref 65–99)
POTASSIUM: 3.5 mmol/L (ref 3.5–5.1)
SODIUM: 141 mmol/L (ref 135–145)

## 2015-09-06 LAB — URINALYSIS, ROUTINE W REFLEX MICROSCOPIC
Bilirubin Urine: NEGATIVE
GLUCOSE, UA: NEGATIVE mg/dL
KETONES UR: NEGATIVE mg/dL
LEUKOCYTES UA: NEGATIVE
Nitrite: NEGATIVE
PROTEIN: NEGATIVE mg/dL
Specific Gravity, Urine: 1.02 (ref 1.005–1.030)
UROBILINOGEN UA: 0.2 mg/dL (ref 0.0–1.0)
pH: 6 (ref 5.0–8.0)

## 2015-09-06 LAB — D-DIMER, QUANTITATIVE (NOT AT ARMC): D DIMER QUANT: 2.05 ug{FEU}/mL — AB (ref 0.00–0.48)

## 2015-09-06 LAB — URINE MICROSCOPIC-ADD ON

## 2015-09-06 LAB — PROTIME-INR
INR: 1.26 (ref 0.00–1.49)
Prothrombin Time: 16 seconds — ABNORMAL HIGH (ref 11.6–15.2)

## 2015-09-06 LAB — BRAIN NATRIURETIC PEPTIDE: B Natriuretic Peptide: 72 pg/mL (ref 0.0–100.0)

## 2015-09-06 NOTE — ED Notes (Signed)
Pt c/o bruising to right foot and swelling to bilateral feet that started yesterday

## 2015-09-06 NOTE — Discharge Instructions (Signed)
Return to get an ultrasound of your legs in the morning.     Peripheral Edema You have swelling in your legs (peripheral edema). This swelling is due to excess accumulation of salt and water in your body. Edema may be a sign of heart, kidney or liver disease, or a side effect of a medication. It may also be due to problems in the leg veins. Elevating your legs and using special support stockings may be very helpful, if the cause of the swelling is due to poor venous circulation. Avoid long periods of standing, whatever the cause. Treatment of edema depends on identifying the cause. Chips, pretzels, pickles and other salty foods should be avoided. Restricting salt in your diet is almost always needed. Water pills (diuretics) are often used to remove the excess salt and water from your body via urine. These medicines prevent the kidney from reabsorbing sodium. This increases urine flow. Diuretic treatment may also result in lowering of potassium levels in your body. Potassium supplements may be needed if you have to use diuretics daily. Daily weights can help you keep track of your progress in clearing your edema. You should call your caregiver for follow up care as recommended. SEEK IMMEDIATE MEDICAL CARE IF:   You have increased swelling, pain, redness, or heat in your legs.  You develop shortness of breath, especially when lying down.  You develop chest or abdominal pain, weakness, or fainting.  You have a fever. Document Released: 01/24/2005 Document Revised: 03/10/2012 Document Reviewed: 01/04/2010 [facility] Patient Information 2015 [place], [place]. This information is not intended to replace advice given to you by your health care provider. Make sure you discuss any questions you have with your health care provider.

## 2015-09-06 NOTE — ED Provider Notes (Signed)
CSN: [id number]     Arrival date & time 09/06/15  2038 History   First MD Initiated Contact with Patient 09/06/15 2055     Chief Complaint  Patient presents with  . Leg Swelling     The history is provided by the patient and a caregiver. No language interpreter was used.   Melanie Lowe presents for evaluation of lower extremity swelling. Her symptoms started yesterday. She initially developed pain and ecchymosis to the right second toe. Today the ecchymosis has spread up her foot and she has increased swelling and bilateral lower extremities, left greater than right. Caregiver states that she also has nodules that have shown up on both of her legs that are painful. Patient versus pain with ambulation and with movement of her lower extremities. She has chronic shortness of breath and COPD and is on 3-1/2 L of oxygen at baseline. She states her shortness of breath is unchanged from prior. She does take torsemide for "fluid", she does endorse decreased urinary output for the last few days. No dysuria. She has no history of trauma to her lower extremities.  Past Medical History  Diagnosis Date  . COPD (chronic obstructive pulmonary disease)   . Hypertension   . Anxiety   . Depression   . Arthritis   . AAA (abdominal aortic aneurysm)   . Neuropathy   . Leg swelling   . Oxygen dependent   . Myocardial infarction   . Pneumonia   . Stroke    Past Surgical History  Procedure Laterality Date  . Cholecystectomy    . Appendectomy     Family History  Problem Relation Age of Onset  . Diabetes Brother   . Cancer Brother     Throat cancer, Heart attack  . Heart disease Brother   . Heart attack Brother   . Cancer Father   . Cancer Sister    Social History  Substance Use Topics  . Smoking status: Former Smoker -- 50 years    Types: Cigarettes    Quit date: 12/31/2010  . Smokeless tobacco: Former Neurosurgeon    Quit date: 12/31/2010  . Alcohol Use: No   OB History    No data available      Review of Systems  All other systems reviewed and are negative.     Allergies  Nsaids; Other; and Tomato  Home Medications   Prior to Admission medications   Medication Sig Start Date End Date Taking? Authorizing Provider  acetaminophen (TYLENOL) 500 MG tablet Take 250-500 mg by mouth daily as needed for mild pain or moderate pain.     Historical Provider, MD  albuterol (PROVENTIL) (2.5 MG/3ML) 0.083% nebulizer solution Take 2.5 mg by nebulization 3 (three) times daily.     Historical Provider, MD  aspirin EC 81 MG tablet Take 81 mg by mouth daily.    Historical Provider, MD  budesonide-formoterol (SYMBICORT) 160-4.5 MCG/ACT inhaler Inhale 2 puffs into the lungs 2 (two) times daily.     Historical Provider, MD  citalopram (CELEXA) 20 MG tablet Take 20 mg by mouth at bedtime.     Historical Provider, MD  diazepam (VALIUM) 10 MG tablet Take 10 mg by mouth 3 (three) times daily as needed for anxiety.    Historical Provider, MD  gabapentin (NEURONTIN) 100 MG tablet Take 100 mg by mouth 5 (five) times daily.     Historical Provider, MD  HYDROcodone-acetaminophen (NORCO) 7.5-325 MG per tablet Take 1 tablet by mouth every 6 (six) hours  as needed for moderate pain. Patient taking differently: Take 1 tablet by mouth at bedtime.  09/05/14   Gerhard Munch, MD  oxyCODONE-acetaminophen (PERCOCET/ROXICET) 5-325 MG per tablet Take 1 tablet by mouth every 6 (six) hours as needed. Patient not taking: Reported on 07/01/2015 06/21/15   Donnetta Hutching, MD  tiotropium [facility]) 18 MCG inhalation capsule Place 18 mcg into inhaler and inhale every evening.     Historical Provider, MD  torsemide (DEMADEX) 20 MG tablet Take 20 mg by mouth every morning.     Historical Provider, MD   BP 115/76 mmHg  Pulse 72  Temp(Src) 97.8 F (36.6 C) (Oral)  Resp 20  Ht 5\' 1"  (1.549 m)  Wt 146 lb (66.225 kg)  BMI 27.60 kg/m2  SpO2 95% Physical Exam  Constitutional: She is oriented to person, place, and time. She appears  well-developed.  Frail, chronically ill-appearing  HENT:  Head: Normocephalic and atraumatic.  Cardiovascular: Normal rate and regular rhythm.   No murmur heard. Pulmonary/Chest: Effort normal. No respiratory distress.  Fine crackles bilaterally  Abdominal: Soft. There is no tenderness. There is no rebound and no guarding.  Musculoskeletal:  1+ pitting edema to the left lower extremity. Approximately 1 cm tender nodule on the left anterior shin. Minimal erythema of the left lower extremity. Right lower extremity with ecchymosis over the second toe and lateral foot. There is minimal edema of the foot and ankle on the right side. 2+ DP pulses in bilateral lower extremities  Neurological: She is alert and oriented to person, place, and time.  Skin: Skin is warm and dry.  Psychiatric: She has a normal mood and affect. Her behavior is normal.  Nursing note and vitals reviewed.   ED Course  Procedures (including critical care time) Labs Review Labs Reviewed  BASIC METABOLIC PANEL - Abnormal; Notable for the following:    Glucose, Bld 134 (*)    BUN 21 (*)    Creatinine, Ser 1.15 (*)    GFR calc non Af Amer 41 (*)    GFR calc Af Amer 47 (*)    Anion gap 3 (*)    All other components within normal limits  CBC WITH DIFFERENTIAL/PLATELET - Abnormal; Notable for the following:    RBC 3.77 (*)    HCT 35.8 (*)    All other components within normal limits  PROTIME-INR - Abnormal; Notable for the following:    Prothrombin Time 16.0 (*)    All other components within normal limits  URINALYSIS, ROUTINE W REFLEX MICROSCOPIC (NOT AT [facility]) - Abnormal; Notable for the following:    Hgb urine dipstick TRACE (*)    All other components within normal limits  D-DIMER, QUANTITATIVE (NOT AT [facility]) - Abnormal; Notable for the following:    D-Dimer, Quant 2.05 (*)    All other components within normal limits  URINE MICROSCOPIC-ADD ON - Abnormal; Notable for the following:    Squamous Epithelial / LPF  FEW (*)    Bacteria, UA FEW (*)    All other components within normal limits  BRAIN NATRIURETIC PEPTIDE    Imaging Review Dg Chest 2 View  09/06/2015   CLINICAL DATA:  81 year old female with right foot bruising and swelling  EXAM: CHEST  2 VIEW  COMPARISON:  Chest CT dated 07/01/2015  FINDINGS: Two views of the chest demonstrate emphysematous changes of the lungs. There are bibasilar linear atelectasis/ scarring. No focal consolidation, pleural effusion or pneumothorax. Mild cardiomegaly. The osseous structures are grossly unremarkable.  IMPRESSION:  Emphysema.  No focal consolidation.  No pneumothorax.   Electronically Signed   By: Elgie Collard M.D.   On: 09/06/2015 21:53   I have personally reviewed and evaluated these images and lab results as part of my medical decision-making.   EKG Interpretation None      MDM   Final diagnoses:  Leg swelling    Patient with history of COPD, aortic aneurysm that is not amenable to repair here with progressive swelling and pain in bilateral lower extremities. Examination is not consistent with cellulitis or infection. BMP demonstrates stable renal insufficiency. D-dimer is elevated. Plan to have patient return for vascular ultrasound. Family states the patient cannot take any blood thinners so no prophylactic Lovenox given at this time. Discussed him care, PCP follow-up, return precautions with the patient.  Tilden Fossa, MD 09/07/15 [phone]

## 2015-09-07 ENCOUNTER — Ambulatory Visit (HOSPITAL_COMMUNITY)
Admit: 2015-09-07 | Discharge: 2015-09-07 | Disposition: A | Discharge status: Home / Self Care | Payer: Commercial Managed Care - HMO | Attending: Emergency Medicine | Admitting: Emergency Medicine

## 2015-09-07 DIAGNOSIS — I82812 Embolism and thrombosis of superficial veins of left lower extremities: Secondary | ICD-10-CM | POA: Diagnosis not present

## 2015-09-07 DIAGNOSIS — M1990 Unspecified osteoarthritis, unspecified site: Secondary | ICD-10-CM | POA: Diagnosis not present

## 2015-09-07 DIAGNOSIS — F419 Anxiety disorder, unspecified: Secondary | ICD-10-CM | POA: Diagnosis not present

## 2015-09-07 DIAGNOSIS — I10 Essential (primary) hypertension: Secondary | ICD-10-CM | POA: Diagnosis not present

## 2015-09-07 DIAGNOSIS — R58 Hemorrhage, not elsewhere classified: Secondary | ICD-10-CM | POA: Diagnosis not present

## 2015-09-07 DIAGNOSIS — J441 Chronic obstructive pulmonary disease with (acute) exacerbation: Secondary | ICD-10-CM | POA: Diagnosis not present

## 2015-09-07 DIAGNOSIS — M7989 Other specified soft tissue disorders: Secondary | ICD-10-CM | POA: Diagnosis not present

## 2015-09-07 DIAGNOSIS — F329 Major depressive disorder, single episode, unspecified: Secondary | ICD-10-CM | POA: Diagnosis not present

## 2015-09-07 DIAGNOSIS — I252 Old myocardial infarction: Secondary | ICD-10-CM | POA: Diagnosis not present

## 2015-09-07 DIAGNOSIS — R2243 Localized swelling, mass and lump, lower limb, bilateral: Secondary | ICD-10-CM | POA: Diagnosis not present

## 2015-09-12 ENCOUNTER — Emergency Department (HOSPITAL_COMMUNITY)
Admission: EM | Admit: 2015-09-12 | Discharge: 2015-09-12 | Disposition: A | Discharge status: Home / Self Care | Payer: Medicare HMO | Attending: Emergency Medicine | Admitting: Emergency Medicine

## 2015-09-12 ENCOUNTER — Emergency Department (HOSPITAL_COMMUNITY): Payer: Medicare HMO

## 2015-09-12 ENCOUNTER — Encounter (HOSPITAL_COMMUNITY): Payer: Self-pay | Admitting: Cardiology

## 2015-09-12 DIAGNOSIS — I499 Cardiac arrhythmia, unspecified: Secondary | ICD-10-CM | POA: Diagnosis not present

## 2015-09-12 DIAGNOSIS — Z79899 Other long term (current) drug therapy: Secondary | ICD-10-CM | POA: Insufficient documentation

## 2015-09-12 DIAGNOSIS — I714 Abdominal aortic aneurysm, without rupture, unspecified: Secondary | ICD-10-CM

## 2015-09-12 DIAGNOSIS — F419 Anxiety disorder, unspecified: Secondary | ICD-10-CM | POA: Insufficient documentation

## 2015-09-12 DIAGNOSIS — F329 Major depressive disorder, single episode, unspecified: Secondary | ICD-10-CM | POA: Diagnosis not present

## 2015-09-12 DIAGNOSIS — Z8673 Personal history of transient ischemic attack (TIA), and cerebral infarction without residual deficits: Secondary | ICD-10-CM | POA: Insufficient documentation

## 2015-09-12 DIAGNOSIS — Z7982 Long term (current) use of aspirin: Secondary | ICD-10-CM | POA: Diagnosis not present

## 2015-09-12 DIAGNOSIS — M1990 Unspecified osteoarthritis, unspecified site: Secondary | ICD-10-CM | POA: Insufficient documentation

## 2015-09-12 DIAGNOSIS — G8929 Other chronic pain: Secondary | ICD-10-CM | POA: Insufficient documentation

## 2015-09-12 DIAGNOSIS — Z9981 Dependence on supplemental oxygen: Secondary | ICD-10-CM | POA: Diagnosis not present

## 2015-09-12 DIAGNOSIS — M545 Low back pain, unspecified: Secondary | ICD-10-CM

## 2015-09-12 DIAGNOSIS — J449 Chronic obstructive pulmonary disease, unspecified: Secondary | ICD-10-CM | POA: Diagnosis not present

## 2015-09-12 DIAGNOSIS — R109 Unspecified abdominal pain: Secondary | ICD-10-CM | POA: Diagnosis not present

## 2015-09-12 DIAGNOSIS — R001 Bradycardia, unspecified: Secondary | ICD-10-CM | POA: Diagnosis not present

## 2015-09-12 DIAGNOSIS — I252 Old myocardial infarction: Secondary | ICD-10-CM | POA: Insufficient documentation

## 2015-09-12 DIAGNOSIS — M150 Primary generalized (osteo)arthritis: Secondary | ICD-10-CM | POA: Diagnosis not present

## 2015-09-12 DIAGNOSIS — Z9049 Acquired absence of other specified parts of digestive tract: Secondary | ICD-10-CM | POA: Insufficient documentation

## 2015-09-12 DIAGNOSIS — I10 Essential (primary) hypertension: Secondary | ICD-10-CM | POA: Insufficient documentation

## 2015-09-12 DIAGNOSIS — R0902 Hypoxemia: Secondary | ICD-10-CM | POA: Diagnosis not present

## 2015-09-12 DIAGNOSIS — G629 Polyneuropathy, unspecified: Secondary | ICD-10-CM | POA: Insufficient documentation

## 2015-09-12 DIAGNOSIS — Z87891 Personal history of nicotine dependence: Secondary | ICD-10-CM | POA: Insufficient documentation

## 2015-09-12 DIAGNOSIS — Z8701 Personal history of pneumonia (recurrent): Secondary | ICD-10-CM | POA: Diagnosis not present

## 2015-09-12 LAB — CBC WITH DIFFERENTIAL/PLATELET
BASOS ABS: 0 10*3/uL (ref 0.0–0.1)
Basophils Relative: 0 % (ref 0–1)
EOS PCT: 4 % (ref 0–5)
Eosinophils Absolute: 0.2 10*3/uL (ref 0.0–0.7)
HCT: 35 % — ABNORMAL LOW (ref 36.0–46.0)
HEMOGLOBIN: 11.7 g/dL — AB (ref 12.0–15.0)
LYMPHS PCT: 32 % (ref 12–46)
Lymphs Abs: 1.5 10*3/uL (ref 0.7–4.0)
MCH: 31.9 pg (ref 26.0–34.0)
MCHC: 33.4 g/dL (ref 30.0–36.0)
MCV: 95.4 fL (ref 78.0–100.0)
Monocytes Absolute: 0.5 10*3/uL (ref 0.1–1.0)
Monocytes Relative: 10 % (ref 3–12)
NEUTROS PCT: 54 % (ref 43–77)
Neutro Abs: 2.5 10*3/uL (ref 1.7–7.7)
PLATELETS: 189 10*3/uL (ref 150–400)
RBC: 3.67 MIL/uL — AB (ref 3.87–5.11)
RDW: 13.6 % (ref 11.5–15.5)
WBC: 4.6 10*3/uL (ref 4.0–10.5)

## 2015-09-12 LAB — URINALYSIS, ROUTINE W REFLEX MICROSCOPIC
Bilirubin Urine: NEGATIVE
GLUCOSE, UA: NEGATIVE mg/dL
Hgb urine dipstick: NEGATIVE
Ketones, ur: NEGATIVE mg/dL
LEUKOCYTES UA: NEGATIVE
NITRITE: NEGATIVE
PROTEIN: NEGATIVE mg/dL
Specific Gravity, Urine: 1.01 (ref 1.005–1.030)
Urobilinogen, UA: 0.2 mg/dL (ref 0.0–1.0)
pH: 7.5 (ref 5.0–8.0)

## 2015-09-12 LAB — BASIC METABOLIC PANEL
ANION GAP: 3 — AB (ref 5–15)
BUN: 20 mg/dL (ref 6–20)
CHLORIDE: 106 mmol/L (ref 101–111)
CO2: 29 mmol/L (ref 22–32)
Calcium: 8.6 mg/dL — ABNORMAL LOW (ref 8.9–10.3)
Creatinine, Ser: 0.89 mg/dL (ref 0.44–1.00)
GFR calc Af Amer: >60 mL/min (ref >60)
GFR, EST NON AFRICAN AMERICAN: 56 mL/min — AB (ref >60)
Glucose, Bld: 96 mg/dL (ref 65–99)
POTASSIUM: 4 mmol/L (ref 3.5–5.1)
Sodium: 138 mmol/L (ref 135–145)

## 2015-09-12 MED ORDER — DIAZEPAM 5 MG PO TABS
5.0000 mg | ORAL_TABLET | Freq: Once | ORAL | Status: AC
  Administered 2015-09-12: 5 mg via ORAL
  Filled 2015-09-12: qty 1

## 2015-09-12 MED ORDER — MORPHINE SULFATE (PF) 4 MG/ML IV SOLN
4.0000 mg | Freq: Once | INTRAVENOUS | Status: AC
Start: 2015-09-12 — End: 2015-09-12
  Administered 2015-09-12: 4 mg via INTRAVENOUS
  Filled 2015-09-12: qty 1

## 2015-09-12 MED ORDER — IOHEXOL 350 MG/ML SOLN
100.0000 mL | Freq: Once | INTRAVENOUS | Status: AC | PRN
  Administered 2015-09-12: 100 mL via INTRAVENOUS

## 2015-09-12 MED ORDER — OXYCODONE-ACETAMINOPHEN 5-325 MG PO TABS: 1.0000 | ORAL_TABLET | Freq: Four times a day (QID) | ORAL | Status: DC | PRN

## 2015-09-12 NOTE — ED Notes (Addendum)
Went to bed [employer] and was ok.   This morning woke up with an unsteady gait.  Neurologically was ok with EMS.  C/o her whole left side hurting her.  CBG 100.

## 2015-09-12 NOTE — Discharge Instructions (Signed)
Abdominal Aortic Aneurysm An aneurysm is a weakened or damaged part of an artery wall that bulges from the normal force of blood pumping through the body. An abdominal aortic aneurysm is an aneurysm that occurs in the lower part of the aorta, the main artery of the body.  The major concern with an abdominal aortic aneurysm is that it can enlarge and burst (rupture) or blood can flow between the layers of the wall of the aorta through a tear (aorticdissection). Both of these conditions can cause bleeding inside the body and can be life threatening unless diagnosed and treated promptly. CAUSES  The exact cause of an abdominal aortic aneurysm is unknown. Some contributing factors are:   A hardening of the arteries caused by the buildup of fat and other substances in the lining of a blood vessel (arteriosclerosis).  Inflammation of the walls of an artery (arteritis).   Connective tissue diseases, such as Marfan syndrome.   Abdominal trauma.   An infection, such as syphilis or staphylococcus, in the wall of the aorta (infectious aortitis) caused by bacteria. RISK FACTORS  Risk factors that contribute to an abdominal aortic aneurysm may include:  Age older than 60 years.   High blood pressure (hypertension).  Female gender.  Ethnicity (white race).  Obesity.  Family history of aneurysm (first degree relatives only).  Tobacco use. PREVENTION  The following healthy lifestyle habits may help decrease your risk of abdominal aortic aneurysm:  Quitting smoking. Smoking can raise your blood pressure and cause arteriosclerosis.  Limiting or avoiding alcohol.  Keeping your blood pressure, blood sugar level, and cholesterol levels within normal limits.  Decreasing your salt intake. In somepeople, too much salt can raise blood pressure and increase your risk of abdominal aortic aneurysm.  Eating a diet low in saturated fats and cholesterol.  Increasing your fiber intake by including  whole grains, vegetables, and fruits in your diet. Eating these foods may help lower blood pressure.  Maintaining a healthy weight.  Staying physically active and exercising regularly. SYMPTOMS  The symptoms of abdominal aortic aneurysm may vary depending on the size and rate of growth of the aneurysm.Most grow slowly and do not have any symptoms. When symptoms do occur, they may include:  Pain (abdomen, side, lower back, or groin). The pain may vary in intensity. A sudden onset of severe pain may indicate that the aneurysm has ruptured.  Feeling full after eating only small amounts of food.  Nausea or vomiting or both.  Feeling a pulsating lump in the abdomen.  Feeling faint or passing out. DIAGNOSIS  Since most unruptured abdominal aortic aneurysms have no symptoms, they are often discovered during diagnostic exams for other conditions. An aneurysm may be found during the following procedures:  Ultrasonography (A one-time screening for abdominal aortic aneurysm by ultrasonography is also recommended for all men aged 65-75 years who have ever smoked).  X-ray exams.  A computed tomography (CT).  Magnetic resonance imaging (MRI).  Angiography or arteriography. TREATMENT  Treatment of an abdominal aortic aneurysm depends on the size of your aneurysm, your age, and risk factors for rupture. Medication to control blood pressure and pain may be used to manage aneurysms smaller than 6 cm. Regular monitoring for enlargement may be recommended by your caregiver if:  The aneurysm is 3-4 cm in size (an annual ultrasonography may be recommended).  The aneurysm is 4-4.5 cm in size (an ultrasonography every 6 months may be recommended).  The aneurysm is larger than 4.5 cm in   size (your caregiver may ask that you be examined by a vascular surgeon). If your aneurysm is larger than 6 cm, surgical repair may be recommended. There are two main methods for repair of an aneurysm:   Endovascular  repair (a minimally invasive surgery). This is done most often.  Open repair. This method is used if an endovascular repair is not possible. Document Released: 09/26/2005 Document Revised: 04/13/2013 Document Reviewed: 01/16/2013 Exit[employer] Patient Information 2015 [employer]. This information is not intended to replace advice given to you by your health care provider. Make sure you discuss any questions you have with your health care provider.  

## 2015-09-12 NOTE — ED Provider Notes (Signed)
CSN: [id number]     Arrival date & time 09/12/15  4098 History   First MD Initiated Contact with Patient 09/12/15 [phone]     Chief Complaint  Patient presents with  . Gait Problem      HPI Patient presents to the emergency department with a history of chronic back pain however she presents with new acute worsening left flank pain with associated nausea.  She denies vomiting.  She states that she was having difficulty walking today because of severe pain in her left back with radiation down towards her left buttock and left groin.  She does have a history of large abdominal aneurysm.  She states that she has seen multiple vascular surgeons including vascular surgeons at [facility] to all believe that she is not a surgical candidate for repair of her AAA secondary to comorbidities.  The patient is aware of this and she has accepted this fact.  Family understands that at some point she will have rupture of her AAA.  She denies weakness in her legs.   Past Medical History  Diagnosis Date  . COPD (chronic obstructive pulmonary disease)   . Hypertension   . Anxiety   . Depression   . Arthritis   . AAA (abdominal aortic aneurysm)   . Neuropathy   . Leg swelling   . Oxygen dependent   . Myocardial infarction   . Pneumonia   . Stroke    Past Surgical History  Procedure Laterality Date  . Cholecystectomy    . Appendectomy     Family History  Problem Relation Age of Onset  . Diabetes Brother   . Cancer Brother     Throat cancer, Heart attack  . Heart disease Brother   . Heart attack Brother   . Cancer Father   . Cancer Sister    Social History  Substance Use Topics  . Smoking status: Former Smoker -- 50 years    Types: Cigarettes    Quit date: 12/31/2010  . Smokeless tobacco: Former Neurosurgeon    Quit date: 12/31/2010  . Alcohol Use: No   OB History    No data available     Review of Systems  All other systems reviewed and are negative.     Allergies  Nsaids;  Other; and Tomato  Home Medications   Prior to Admission medications   Medication Sig Start Date End Date Taking? Authorizing Provider  acetaminophen (TYLENOL) 500 MG tablet Take 250-500 mg by mouth daily as needed for mild pain or moderate pain.     Historical Provider, MD  albuterol (PROVENTIL) (2.5 MG/3ML) 0.083% nebulizer solution Take 2.5 mg by nebulization 3 (three) times daily.     Historical Provider, MD  aspirin EC 81 MG tablet Take 81 mg by mouth daily.    Historical Provider, MD  budesonide-formoterol (SYMBICORT) 160-4.5 MCG/ACT inhaler Inhale 2 puffs into the lungs 2 (two) times daily.     Historical Provider, MD  citalopram (CELEXA) 20 MG tablet Take 20 mg by mouth at bedtime.     Historical Provider, MD  diazepam (VALIUM) 10 MG tablet Take 10 mg by mouth 3 (three) times daily as needed for anxiety.    Historical Provider, MD  gabapentin (NEURONTIN) 100 MG capsule  09/06/15   Historical Provider, MD  gabapentin (NEURONTIN) 100 MG tablet Take 100 mg by mouth 5 (five) times daily.     Historical Provider, MD  HYDROcodone-acetaminophen (NORCO) 7.5-325 MG per tablet Take 1 tablet by  mouth every 6 (six) hours as needed for moderate pain. Patient taking differently: Take 1 tablet by mouth at bedtime.  09/05/14   Gerhard Munch, MD  oxyCODONE-acetaminophen (PERCOCET/ROXICET) 5-325 MG per tablet Take 1 tablet by mouth every 6 (six) hours as needed. 09/12/15   Azalia Bilis, MD  tiotropium (SPIRIVA) 18 MCG inhalation capsule Place 18 mcg into inhaler and inhale every evening.     Historical Provider, MD  torsemide (DEMADEX) 20 MG tablet Take 20 mg by mouth every morning.     Historical Provider, MD   BP 149/81 mmHg  Pulse 53  Temp(Src) 97.7 F (36.5 C) (Oral)  Resp 15  Ht 5\' 1"  (1.549 m)  Wt 146 lb (66.225 kg)  BMI 27.60 kg/m2  SpO2 96% Physical Exam  Constitutional: She is oriented to person, place, and time. She appears well-developed and well-nourished. No distress.  HENT:  Head:  Normocephalic and atraumatic.  Eyes: EOM are normal.  Neck: Normal range of motion.  Cardiovascular: Normal rate, regular rhythm and normal heart sounds.   Pulmonary/Chest: Effort normal and breath sounds normal.  Abdominal: Soft. She exhibits no distension. There is no tenderness.  Musculoskeletal: Normal range of motion.  5 out of 5 strength in bilateral lower extremity major muscle groups.  Mild left flank tenderness  Neurological: She is alert and oriented to person, place, and time.  Skin: Skin is warm and dry. No rash noted.  Psychiatric: She has a normal mood and affect. Judgment normal.  Nursing note and vitals reviewed.   ED Course  Procedures (including critical care time) Labs Review Labs Reviewed  CBC WITH DIFFERENTIAL/PLATELET - Abnormal; Notable for the following:    RBC 3.67 (*)    Hemoglobin 11.7 (*)    HCT 35.0 (*)    All other components within normal limits  BASIC METABOLIC PANEL - Abnormal; Notable for the following:    Calcium 8.6 (*)    GFR calc non Af Amer 56 (*)    Anion gap 3 (*)    All other components within normal limits  URINALYSIS, ROUTINE W REFLEX MICROSCOPIC (NOT AT [facility])    Imaging Review Ct Cta Abd/pel W/cm &/or W/o Cm  09/12/2015   CLINICAL DATA:  Left-sided back and abdominal pain with known 7.5 cm abdominal aortic aneurysm.  EXAM: CTA ABDOMEN AND PELVIS WITH CONTRAST  TECHNIQUE: Multidetector CT imaging of the abdomen and pelvis was performed using the standard protocol during bolus administration of intravenous contrast. Multiplanar reconstructed images and MIPs were obtained and reviewed to evaluate the vascular anatomy.  CONTRAST:  OMNIPAQUE IOHEXOL 350 MG/ML SOLN  COMPARISON:  07/01/2015  FINDINGS: Compared to the most recent CTA examination in July, the eccentric saccular aneurysm of the mid abdominal aorta is likely slightly larger and has definitely enlarged since a prior study on 11/22/2014. Current maximal transverse/AP dimensions  of the aneurysm are 7.1 x 7.5 cm. When obtaining similar axis measurements on the prior CTA in July, dimensions are 6.9 x 7.3 cm. Overall maximum oblique diameter of the aneurysm is similar at 7.6 cm compared to 7.5 cm on the prior study. Maximum diameter of the aneurysm in 2015 was 6.8 cm. Although there is no overt evidence of aneurysm rupture or leak on the current study, symptomatic enlargement may be indicative of impending rupture and clinical evaluation by of Vascular Surgery is recommended.  Morphology of the aneurysm is again noted to be quite complex with dilatation beginning at or slightly above the level of  the renal arteries and superior displacement of the left renal artery identified draped over the top of the aneurysm with mass effect on the left kidney by the eccentric saccular aneurysm that projects towards the left. The abdominal aorta at the level of the celiac axis shows stable dilatation measuring 3.7 x 4.1 cm. 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 Stable calcified plaque at the origin of the superior mesenteric artery causes stenosis of approximately 50%. Single renal arteries are present which show no evidence of significant stenosis. The inferior mesenteric artery is patent. Just below the dominant saccular aneurysm, the abdominal aorta measures 2.8 x 2.9 cm. There is stable atherosclerosis involving bilateral iliac arteries with mild aneurysmal dilatation of the right common iliac artery measuring up to 16 mm in maximal diameter. No significant iliac or common femoral occlusive disease is identified.  Nonvascular evaluation shows a stable appearance to the solid organs without focal abnormalities. No evidence of bowel obstruction, abnormal fluid collection or abscess. The bladder is moderately distended. Tiny bilateral inguinal hernias are again identified containing fat and appears stable.  Review of the MIP images confirms the above findings.   IMPRESSION: 1. Slight enlargement of the eccentric left-sided mid abdominal aortic saccular aneurysm since prior studies including the most recent study in July. Since that time aneurysm dimensions are felt to have increased by roughly 2 mm with maximal dimensions currently of approximately 7.1 x 7.5 cm. Aneurysm dimensions in 10/2014 were approximately 6.8 x 6.8 cm. Although there is no overt evidence of aneurysm rupture, symptomatic enlargement may be indicative of impending rupture and Vascular Surgical consultation is recommended. 2. Stable mild dilatation of the proximal abdominal aorta measuring 3.7 x 4.1 cm at the level of the celiac axis. 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 3. 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 4. These results were called by telephone at the time of interpretation on 09/12/2015 at 10:05 am to Burgess Amor, PA-C, who verbally acknowledged these results.   Electronically Signed   By: Irish Lack M.D.   On: 09/12/2015 10:07   I have personally reviewed and evaluated these images and lab results as part of my medical decision-making.   EKG Interpretation   Date/Time:  Monday September 12 2015 07:41:16 EDT Ventricular Rate:  50 PR Interval:  231 QRS Duration: 93 QT Interval:  468 QTC Calculation: 427 R Axis:   65 Text Interpretation:  Sinus rhythm Prolonged PR interval No significant  change was found Confirmed by Candy Leverett  MD, Caryn Bee ([id number]) on 09/12/2015  12:28:48 PM      MDM   Final diagnoses:  Left-sided low back pain without sciatica  AAA (abdominal aortic aneurysm), not a candidate for repair   patient does have a very large AAA which has had interval increase in size since imaging earlier this summer.  This is concerning given its significant increase in size in short period time.  Her pain today could be coming from symptoms of her abdominal  aneurysm.  Given the fact that there are no surgical options for the patient per the patient and the patient's family and they have all accepted that at some point she will have rupture of her aneurysm, I will not proceed further with this at this time.  She is 81 years old.  She does have multiple comorbidities.  I'll send her home with additional pain medicine.  I have asked the patient and family to focus on quality of life and to better manage her pain and to work with her physicians to manage her symptoms.  All questions were answered.    Azalia Bilis, MD 09/12/15 [phone]

## 2015-09-15 DIAGNOSIS — J449 Chronic obstructive pulmonary disease, unspecified: Secondary | ICD-10-CM | POA: Diagnosis not present

## 2015-09-15 DIAGNOSIS — I714 Abdominal aortic aneurysm, without rupture: Secondary | ICD-10-CM | POA: Diagnosis not present

## 2015-09-15 DIAGNOSIS — Z23 Encounter for immunization: Secondary | ICD-10-CM | POA: Diagnosis not present

## 2015-09-15 DIAGNOSIS — M545 Low back pain: Secondary | ICD-10-CM | POA: Diagnosis not present

## 2015-09-15 DIAGNOSIS — I10 Essential (primary) hypertension: Secondary | ICD-10-CM | POA: Diagnosis not present

## 2015-10-12 DIAGNOSIS — M150 Primary generalized (osteo)arthritis: Secondary | ICD-10-CM | POA: Diagnosis not present

## 2015-10-12 DIAGNOSIS — R0902 Hypoxemia: Secondary | ICD-10-CM | POA: Diagnosis not present

## 2015-10-29 ENCOUNTER — Emergency Department (HOSPITAL_COMMUNITY)
Admission: EM | Admit: 2015-10-29 | Discharge: 2015-10-29 | Disposition: A | Discharge status: Home / Self Care | Payer: Commercial Managed Care - HMO | Attending: Emergency Medicine | Admitting: Emergency Medicine

## 2015-10-29 ENCOUNTER — Emergency Department (HOSPITAL_COMMUNITY): Payer: Commercial Managed Care - HMO

## 2015-10-29 ENCOUNTER — Encounter (HOSPITAL_COMMUNITY): Payer: Self-pay | Admitting: *Deleted

## 2015-10-29 DIAGNOSIS — Z8701 Personal history of pneumonia (recurrent): Secondary | ICD-10-CM | POA: Diagnosis not present

## 2015-10-29 DIAGNOSIS — F419 Anxiety disorder, unspecified: Secondary | ICD-10-CM | POA: Diagnosis not present

## 2015-10-29 DIAGNOSIS — K5793 Diverticulitis of intestine, part unspecified, without perforation or abscess with bleeding: Secondary | ICD-10-CM | POA: Diagnosis not present

## 2015-10-29 DIAGNOSIS — M1990 Unspecified osteoarthritis, unspecified site: Secondary | ICD-10-CM | POA: Insufficient documentation

## 2015-10-29 DIAGNOSIS — I10 Essential (primary) hypertension: Secondary | ICD-10-CM | POA: Insufficient documentation

## 2015-10-29 DIAGNOSIS — K5792 Diverticulitis of intestine, part unspecified, without perforation or abscess without bleeding: Secondary | ICD-10-CM | POA: Diagnosis not present

## 2015-10-29 DIAGNOSIS — I252 Old myocardial infarction: Secondary | ICD-10-CM | POA: Insufficient documentation

## 2015-10-29 DIAGNOSIS — F329 Major depressive disorder, single episode, unspecified: Secondary | ICD-10-CM | POA: Diagnosis not present

## 2015-10-29 DIAGNOSIS — G629 Polyneuropathy, unspecified: Secondary | ICD-10-CM | POA: Insufficient documentation

## 2015-10-29 DIAGNOSIS — Z8673 Personal history of transient ischemic attack (TIA), and cerebral infarction without residual deficits: Secondary | ICD-10-CM | POA: Insufficient documentation

## 2015-10-29 DIAGNOSIS — K4090 Unilateral inguinal hernia, without obstruction or gangrene, not specified as recurrent: Secondary | ICD-10-CM | POA: Diagnosis not present

## 2015-10-29 DIAGNOSIS — Z9981 Dependence on supplemental oxygen: Secondary | ICD-10-CM | POA: Insufficient documentation

## 2015-10-29 DIAGNOSIS — Z79899 Other long term (current) drug therapy: Secondary | ICD-10-CM | POA: Diagnosis not present

## 2015-10-29 DIAGNOSIS — Z87891 Personal history of nicotine dependence: Secondary | ICD-10-CM | POA: Insufficient documentation

## 2015-10-29 DIAGNOSIS — Z7982 Long term (current) use of aspirin: Secondary | ICD-10-CM | POA: Insufficient documentation

## 2015-10-29 DIAGNOSIS — R1032 Left lower quadrant pain: Secondary | ICD-10-CM | POA: Diagnosis present

## 2015-10-29 DIAGNOSIS — J449 Chronic obstructive pulmonary disease, unspecified: Secondary | ICD-10-CM | POA: Insufficient documentation

## 2015-10-29 DIAGNOSIS — Z7951 Long term (current) use of inhaled steroids: Secondary | ICD-10-CM | POA: Insufficient documentation

## 2015-10-29 DIAGNOSIS — K5732 Diverticulitis of large intestine without perforation or abscess without bleeding: Secondary | ICD-10-CM | POA: Diagnosis not present

## 2015-10-29 LAB — BASIC METABOLIC PANEL
ANION GAP: 7 (ref 5–15)
BUN: 31 mg/dL — ABNORMAL HIGH (ref 6–20)
CALCIUM: 9.6 mg/dL (ref 8.9–10.3)
CHLORIDE: 104 mmol/L (ref 101–111)
CO2: 30 mmol/L (ref 22–32)
CREATININE: 1.1 mg/dL — AB (ref 0.44–1.00)
GFR calc non Af Amer: 43 mL/min — ABNORMAL LOW (ref >60)
GFR, EST AFRICAN AMERICAN: 50 mL/min — AB (ref >60)
Glucose, Bld: 110 mg/dL — ABNORMAL HIGH (ref 65–99)
Potassium: 4.1 mmol/L (ref 3.5–5.1)
SODIUM: 141 mmol/L (ref 135–145)

## 2015-10-29 LAB — CBC WITH DIFFERENTIAL/PLATELET
BASOS PCT: 0 %
Basophils Absolute: 0 10*3/uL (ref 0.0–0.1)
EOS ABS: 0.2 10*3/uL (ref 0.0–0.7)
Eosinophils Relative: 3 %
HEMATOCRIT: 38.6 % (ref 36.0–46.0)
HEMOGLOBIN: 12.6 g/dL (ref 12.0–15.0)
LYMPHS ABS: 1.9 10*3/uL (ref 0.7–4.0)
Lymphocytes Relative: 29 %
MCH: 31.8 pg (ref 26.0–34.0)
MCHC: 32.6 g/dL (ref 30.0–36.0)
MCV: 97.5 fL (ref 78.0–100.0)
Monocytes Absolute: 0.6 10*3/uL (ref 0.1–1.0)
Monocytes Relative: 9 %
NEUTROS ABS: 3.9 10*3/uL (ref 1.7–7.7)
NEUTROS PCT: 59 %
Platelets: 226 10*3/uL (ref 150–400)
RBC: 3.96 MIL/uL (ref 3.87–5.11)
RDW: 13.5 % (ref 11.5–15.5)
WBC: 6.6 10*3/uL (ref 4.0–10.5)

## 2015-10-29 MED ORDER — IOHEXOL 300 MG/ML  SOLN
25.0000 mL | Freq: Once | INTRAMUSCULAR | Status: AC | PRN
  Administered 2015-10-29: 25 mL via ORAL

## 2015-10-29 MED ORDER — CIPROFLOXACIN HCL 250 MG PO TABS
500.0000 mg | ORAL_TABLET | Freq: Once | ORAL | Status: AC
  Administered 2015-10-29: 500 mg via ORAL
  Filled 2015-10-29: qty 2

## 2015-10-29 MED ORDER — METRONIDAZOLE 500 MG PO TABS
500.0000 mg | ORAL_TABLET | Freq: Once | ORAL | Status: AC
  Administered 2015-10-29: 500 mg via ORAL
  Filled 2015-10-29: qty 1

## 2015-10-29 MED ORDER — CIPROFLOXACIN HCL 500 MG PO TABS: 500.0000 mg | ORAL_TABLET | Freq: Every day | ORAL | Status: DC

## 2015-10-29 MED ORDER — METRONIDAZOLE 500 MG PO TABS: 500.0000 mg | ORAL_TABLET | Freq: Three times a day (TID) | ORAL | Status: DC

## 2015-10-29 NOTE — Discharge Instructions (Signed)
Diverticulitis Diverticulitis is inflammation or infection of small pouches in your colon that form when you have a condition called diverticulosis. The pouches in your colon are called diverticula. Your colon, or large intestine, is where water is absorbed and stool is formed. Complications of diverticulitis can include:  Bleeding.  Severe infection.  Severe pain.  Perforation of your colon.  Obstruction of your colon. CAUSES  Diverticulitis is caused by bacteria. Diverticulitis happens when stool becomes trapped in diverticula. This allows bacteria to grow in the diverticula, which can lead to inflammation and infection. RISK FACTORS People with diverticulosis are at risk for diverticulitis. Eating a diet that does not include enough fiber from fruits and vegetables may make diverticulitis more likely to develop. SYMPTOMS  Symptoms of diverticulitis may include:  Abdominal pain and tenderness. The pain is normally located on the left side of the abdomen, but may occur in other areas.  Fever and chills.  Bloating.  Cramping.  Nausea.  Vomiting.  Constipation.  Diarrhea.  Blood in your stool. DIAGNOSIS  Your health care provider will ask you about your medical history and do a physical exam. You may need to have tests done because many medical conditions can cause the same symptoms as diverticulitis. Tests may include:  Blood tests.  Urine tests.  Imaging tests of the abdomen, including X-rays and CT scans. When your condition is under control, your health care provider may recommend that you have a colonoscopy. A colonoscopy can show how severe your diverticula are and whether something else is causing your symptoms. TREATMENT  Most cases of diverticulitis are mild and can be treated at home. Treatment may include:  Taking over-the-counter pain medicines.  Following a clear liquid diet.  Taking antibiotic medicines by mouth for 7-10 days. More severe cases may  be treated at a hospital. Treatment may include:  Not eating or drinking.  Taking prescription pain medicine.  Receiving antibiotic medicines through an IV tube.  Receiving fluids and nutrition through an IV tube.  Surgery. HOME CARE INSTRUCTIONS   Follow your health care provider's instructions carefully.  Follow a full liquid diet or other diet as directed by your health care provider. After your symptoms improve, your health care provider may tell you to change your diet. He or she may recommend you eat a high-fiber diet. Fruits and vegetables are good sources of fiber. Fiber makes it easier to pass stool.  Take fiber supplements or probiotics as directed by your health care provider.  Only take medicines as directed by your health care provider.  Keep all your follow-up appointments. SEEK MEDICAL CARE IF:   Your pain does not improve.  You have a hard time eating food.  Your bowel movements do not return to normal. SEEK IMMEDIATE MEDICAL CARE IF:   Your pain becomes worse.  Your symptoms do not get better.  Your symptoms suddenly get worse.  You have a fever.  You have repeated vomiting.  You have bloody or black, tarry stools. MAKE SURE YOU:   Understand these instructions.  Will watch your condition.  Will get help right away if you are not doing well or get worse.   This information is not intended to replace advice given to you by your health care provider. Make sure you discuss any questions you have with your health care provider.   Document Released: 09/26/2005 Document Revised: 12/22/2013 Document Reviewed: 11/11/2013 Elsevier Interactive Patient Education 2016 [employer]  Inguinal Hernia, Adult Muscles help keep everything in  the body in its proper place. But if a weak spot in the muscles develops, something can poke through. That is called a hernia. When this happens in the lower part of the belly (abdomen), it is called an inguinal hernia.  (It takes its name from a part of the body in this region called the inguinal canal.) A weak spot in the wall of muscles lets some fat or part of the small intestine bulge through. An inguinal hernia can develop at any age. Men get them more often than women. CAUSES  In adults, an inguinal hernia develops over time.  It can be triggered by:  Suddenly straining the muscles of the lower abdomen.  Lifting heavy objects.  Straining to have a bowel movement. Difficult bowel movements (constipation) can lead to this.  Constant coughing. This may be caused by smoking or lung disease.  Being overweight.  Being pregnant.  Working at a job that requires long periods of standing or heavy lifting.  Having had an inguinal hernia before. One type can be an emergency situation. It is called a strangulated inguinal hernia. It develops if part of the small intestine slips through the weak spot and cannot get back into the abdomen. The blood supply can be cut off. If that happens, part of the intestine may die. This situation requires emergency surgery. SYMPTOMS  Often, a small inguinal hernia has no symptoms. It is found when a healthcare provider does a physical exam. Larger hernias usually have symptoms.   In adults, symptoms may include:  A lump in the groin. This is easier to see when the person is standing. It might disappear when lying down.  In men, a lump in the scrotum.  Pain or burning in the groin. This occurs especially when lifting, straining or coughing.  A dull ache or feeling of pressure in the groin.  Signs of a strangulated hernia can include:  A bulge in the groin that becomes very painful and tender to the touch.  A bulge that turns red or purple.  Fever, nausea and vomiting.  Inability to have a bowel movement or to pass gas. DIAGNOSIS  To decide if you have an inguinal hernia, a healthcare provider will probably do a physical examination.  This will include asking  questions about any symptoms you have noticed.  The healthcare provider might feel the groin area and ask you to cough. If an inguinal hernia is felt, the healthcare provider may try to slide it back into the abdomen.  Usually no other tests are needed. TREATMENT  Treatments can vary. The size of the hernia makes a difference. Options include:  Watchful waiting. This is often suggested if the hernia is small and you have had no symptoms.  No medical procedure will be done unless symptoms develop.  You will need to watch closely for symptoms. If any occur, contact your healthcare provider right away.  Surgery. This is used if the hernia is larger or you have symptoms.  Open surgery. This is usually an outpatient procedure (you will not stay overnight in a hospital). An cut (incision) is made through the skin in the groin. The hernia is put back inside the abdomen. The weak area in the muscles is then repaired by herniorrhaphy or hernioplasty. Herniorrhaphy: in this type of surgery, the weak muscles are sewn back together. Hernioplasty: a patch or mesh is used to close the weak area in the abdominal wall.  Laparoscopy. In this procedure, a surgeon makes small  incisions. A thin tube with a tiny video camera (called a laparoscope) is put into the abdomen. The surgeon repairs the hernia with mesh by looking with the video camera and using two long instruments. HOME CARE INSTRUCTIONS   After surgery to repair an inguinal hernia:  You will need to take pain medicine prescribed by your healthcare provider. Follow all directions carefully.  You will need to take care of the wound from the incision.  Your activity will be restricted for awhile. This will probably include no heavy lifting for several weeks. You also should not do anything too active for a few weeks. When you can return to work will depend on the type of job that you have.  During "watchful waiting" periods, you should:  Maintain  a healthy weight.  Eat a diet high in fiber (fruits, vegetables and whole grains).  Drink plenty of fluids to avoid constipation. This means drinking enough water and other liquids to keep your urine clear or pale yellow.  Do not lift heavy objects.  Do not stand for long periods of time.  Quit smoking. This should keep you from developing a frequent cough. SEEK MEDICAL CARE IF:   A bulge develops in your groin area.  You feel pain, a burning sensation or pressure in the groin. This might be worse if you are lifting or straining.  You develop a fever of more than 100.5 F (38.1 C). SEEK IMMEDIATE MEDICAL CARE IF:   Pain in the groin increases suddenly.  A bulge in the groin gets bigger suddenly and does not go down.  For men, there is sudden pain in the scrotum. Or, the size of the scrotum increases.  A bulge in the groin area becomes red or purple and is painful to touch.  You have nausea or vomiting that does not go away.  You feel your heart beating much faster than normal.  You cannot have a bowel movement or pass gas.  You develop a fever of more than 102.0 F (38.9 C).   This information is not intended to replace advice given to you by your health care provider. Make sure you discuss any questions you have with your health care provider.   Document Released: 05/05/2009 Document Revised: 03/10/2012 Document Reviewed: 06/20/2015 Elsevier Interactive Patient Education [employer]

## 2015-10-29 NOTE — ED Notes (Signed)
   10/29/15 1957  Abdominal  Gastrointestinal (WDL) X (tender to lower left pelvic pt states aknot more when she stands.)  Abdomen Inspection Soft  Tenderness Nontender

## 2015-10-29 NOTE — ED Notes (Signed)
Reported pt has a knot to the left groin started about 2 days ago.

## 2015-10-29 NOTE — ED Notes (Signed)
Pt alert & oriented x4, stable gait. Patient given discharge instructions, paperwork & prescription(s). Patient  instructed to stop at the registration desk to finish any additional paperwork. Patient  verbalized understanding. Pt left department in wheelchair w/ no further questions. 

## 2015-10-29 NOTE — ED Provider Notes (Signed)
CSN: [id number]     Arrival date & time 10/29/15  1927 History   First MD Initiated Contact with Patient 10/29/15 1955     Chief Complaint  Patient presents with  . Cyst     The history is provided by the patient. No language interpreter was used.   Melanie Lowe is an 81 year old woman with history of COPD and hypertension here for evaluation of left lower quadrant abdominal pain and swelling. She reports that she has had pain and swelling ("a knot")in the left lower quadrant/inguinal region for the last 2 days with intermittent swelling that is worse with standing. She denies any fevers, vomiting, nausea, diarrhea, constipation, change in urination. Symptoms are moderate, constant, worsening.  Past Medical History  Diagnosis Date  . COPD (chronic obstructive pulmonary disease) (HCC)   . Hypertension   . Anxiety   . Depression   . Arthritis   . AAA (abdominal aortic aneurysm) (HCC)   . Neuropathy (HCC)   . Leg swelling   . Oxygen dependent   . Myocardial infarction (HCC)   . Pneumonia   . Stroke [facility])    Past Surgical History  Procedure Laterality Date  . Cholecystectomy    . Appendectomy     Family History  Problem Relation Age of Onset  . Diabetes Brother   . Cancer Brother     Throat cancer, Heart attack  . Heart disease Brother   . Heart attack Brother   . Cancer Father   . Cancer Sister    Social History  Substance Use Topics  . Smoking status: Former Smoker -- 50 years    Types: Cigarettes    Quit date: 12/31/2010  . Smokeless tobacco: Former Neurosurgeon    Quit date: 12/31/2010  . Alcohol Use: No   OB History    No data available     Review of Systems  All other systems reviewed and are negative.     Allergies  Nsaids; Other; and Tomato  Home Medications   Prior to Admission medications   Medication Sig Start Date End Date Taking? Authorizing Provider  acetaminophen (TYLENOL) 500 MG tablet Take 250-500 mg by mouth daily as needed for mild pain or  moderate pain.     Historical Provider, MD  albuterol (PROVENTIL) (2.5 MG/3ML) 0.083% nebulizer solution Take 2.5 mg by nebulization 3 (three) times daily.     Historical Provider, MD  aspirin EC 81 MG tablet Take 81 mg by mouth daily.    Historical Provider, MD  budesonide-formoterol (SYMBICORT) 160-4.5 MCG/ACT inhaler Inhale 2 puffs into the lungs 2 (two) times daily.     Historical Provider, MD  citalopram (CELEXA) 20 MG tablet Take 20 mg by mouth at bedtime.     Historical Provider, MD  diazepam (VALIUM) 10 MG tablet Take 10 mg by mouth 3 (three) times daily as needed for anxiety.    Historical Provider, MD  gabapentin (NEURONTIN) 100 MG capsule  09/06/15   Historical Provider, MD  gabapentin (NEURONTIN) 100 MG tablet Take 100 mg by mouth 5 (five) times daily.     Historical Provider, MD  HYDROcodone-acetaminophen (NORCO) 7.5-325 MG per tablet Take 1 tablet by mouth every 6 (six) hours as needed for moderate pain. Patient taking differently: Take 1 tablet by mouth at bedtime.  09/05/14   Gerhard Munch, MD  oxyCODONE-acetaminophen (PERCOCET/ROXICET) 5-325 MG per tablet Take 1 tablet by mouth every 6 (six) hours as needed. 09/12/15   Azalia Bilis, MD  tiotropium [facility]) 18  MCG inhalation capsule Place 18 mcg into inhaler and inhale every evening.     Historical Provider, MD  torsemide (DEMADEX) 20 MG tablet Take 20 mg by mouth every morning.     Historical Provider, MD   BP 117/81 mmHg  Pulse 72  Temp(Src) 97.8 F (36.6 C) (Oral)  Resp 20  Ht  (1.575 m)  Wt 126 lb (57.153 kg)  BMI 23.04 kg/m2 Physical Exam  Constitutional: She is oriented to person, place, and time. She appears well-developed and well-nourished.  HENT:  Head: Normocephalic and atraumatic.  Cardiovascular: Normal rate and regular rhythm.   No murmur heard. Pulmonary/Chest: Effort normal and breath sounds normal. No respiratory distress.  Abdominal: Soft. There is no rebound and no guarding.  2+ femoral pulses  bilaterally. There is no area of tenderness in the left lower quadrant just superior to the inguinal crease with intermittent bulging, question possible hernia.  Musculoskeletal: She exhibits no edema or tenderness.  Neurological: She is alert and oriented to person, place, and time.  Skin: Skin is warm and dry.  Psychiatric: She has a normal mood and affect. Her behavior is normal.  Nursing note and vitals reviewed.   ED Course  Procedures (including critical care time) Labs Review Labs Reviewed  BASIC METABOLIC PANEL - Abnormal; Notable for the following:    Glucose, Bld 110 (*)    BUN 31 (*)    Creatinine, Ser 1.10 (*)    GFR calc non Af Amer 43 (*)    GFR calc Af Amer 50 (*)    All other components within normal limits  CBC WITH DIFFERENTIAL/PLATELET  URINALYSIS, ROUTINE W REFLEX MICROSCOPIC (NOT AT [facility])    Imaging Review Ct Abdomen Pelvis Wo Contrast  10/29/2015  CLINICAL DATA:  Left lower quadrant abdominal pain radiating to left groin area. Palpable abnormality in the left groin starting about 2 days ago. EXAM: CT ABDOMEN AND PELVIS WITHOUT CONTRAST TECHNIQUE: Multidetector CT imaging of the abdomen and pelvis was performed following the standard protocol without IV contrast. COMPARISON:  CT abdomen dated 09/12/2015. FINDINGS: Patient is status post cholecystectomy. Liver, spleen, pancreas, adrenal glands, and kidneys are unremarkable for a noncontrast study. No renal or ureteral calculi identified. Bladder is unremarkable. Saccular aneurysm of the mid abdominal aorta is unchanged in size or configuration in the short-term interval, measuring 7.1 x 7.5 cm on the recent contrast-enhanced CT. No periaortic hemorrhage or edema. Prominent atherosclerotic calcifications again noted along the walls of the abdominal aorta and branch vessels. Fairly extensive diverticulosis throughout the descending and sigmoid colon. Subtle bowel wall thickening and pericolic inflammation/fluid stranding  is present within the upper sigmoid colon consistent with a focal acute diverticulitis. No pericolic abscess collection. No free intraperitoneal air (no evidence of bowel perforation). No large or small bowel dilatation. No evidence of bowel wall inflammation. Appendix is not convincingly seen but there are no inflammatory changes in the right lower quadrant to suggest acute appendicitis. No abscess collection. No free intraperitoneal air. No enlarged lymph nodes seen. Uterus and adnexal regions are unremarkable. Degenerative changes are seen throughout the slightly scoliotic thoracolumbar spine but no acute osseous abnormality. Scarring/fibrosis noted at each lung base. Are small bilateral inguinal hernias which contain fat only. No mass or significant lymphadenopathy within either groin region. No fluid collection or inflammatory change within either groin region. IMPRESSION: 1. Focal acute diverticulitis within the upper sigmoid colon with associated mild bowel wall thickening and mild pericolic inflammation/fluid stranding (best seen on series  2, images 49 through 54 and on coronal series 3, images 41 through 44). No pericolic abscess. No evidence of bowel perforation. No associated bowel obstruction. 2. Large abdominal aortic aneurysm, better defined on recent contrast enhanced CT of 09/12/2015, not significantly changed in size compared to that previous exam where it measured 7.1 x 7.5 cm. No periaortic hemorrhage or edema. 3. Small bilateral inguinal hernias which contain fat only. No bowel involvement. No mass or lymphadenopathy within either groin region. No fluid collection or inflammatory change within either groin region. Suspect patient's left groin pain is referred from the area of acute diverticulitis in the left lower quadrant. 4. Degenerative changes throughout the thoracolumbar spine, at least moderate in degree. Electronically Signed   By: Bary Richard M.D.   On: 10/29/2015 21:46   I have  personally reviewed and evaluated these images and lab results as part of my medical decision-making.   EKG Interpretation None      MDM   Final diagnoses:  Acute diverticulitis  Inguinal hernia without obstruction or gangrene, recurrence not specified, unspecified laterality    Patient's here for evaluation of left lower quadrant abdominal pain and sensation of bulging in her left lower quadrant. There is a possible hernia versus stool in the left lower quadrant that is easily reducible. There is some local tenderness. CT scan demonstrated acute diverticulitis. Patient is nontoxic and hydrated on examination. Providing oral antibiotics with outpatient follow-up. Discussed close return precautions. She has a known AAA that is similar compared to prior studies.    Tilden Fossa, MD 10/29/15 2235

## 2015-11-02 ENCOUNTER — Emergency Department (HOSPITAL_COMMUNITY): Payer: Commercial Managed Care - HMO

## 2015-11-02 ENCOUNTER — Encounter (HOSPITAL_COMMUNITY): Payer: Self-pay | Admitting: *Deleted

## 2015-11-02 ENCOUNTER — Inpatient Hospital Stay (HOSPITAL_COMMUNITY)
Admission: EM | Admit: 2015-11-02 | Discharge: 2015-11-06 | DRG: 391 | Disposition: A | Discharge status: Home / Self Care | Payer: Commercial Managed Care - HMO | Attending: Pulmonary Disease | Admitting: Pulmonary Disease

## 2015-11-02 DIAGNOSIS — J449 Chronic obstructive pulmonary disease, unspecified: Secondary | ICD-10-CM | POA: Diagnosis not present

## 2015-11-02 DIAGNOSIS — F419 Anxiety disorder, unspecified: Secondary | ICD-10-CM | POA: Diagnosis present

## 2015-11-02 DIAGNOSIS — I714 Abdominal aortic aneurysm, without rupture: Secondary | ICD-10-CM | POA: Diagnosis not present

## 2015-11-02 DIAGNOSIS — Z87891 Personal history of nicotine dependence: Secondary | ICD-10-CM

## 2015-11-02 DIAGNOSIS — Z9981 Dependence on supplemental oxygen: Secondary | ICD-10-CM

## 2015-11-02 DIAGNOSIS — Z833 Family history of diabetes mellitus: Secondary | ICD-10-CM

## 2015-11-02 DIAGNOSIS — Z8249 Family history of ischemic heart disease and other diseases of the circulatory system: Secondary | ICD-10-CM

## 2015-11-02 DIAGNOSIS — J189 Pneumonia, unspecified organism: Secondary | ICD-10-CM | POA: Diagnosis not present

## 2015-11-02 DIAGNOSIS — I10 Essential (primary) hypertension: Secondary | ICD-10-CM | POA: Diagnosis not present

## 2015-11-02 DIAGNOSIS — J9620 Acute and chronic respiratory failure, unspecified whether with hypoxia or hypercapnia: Secondary | ICD-10-CM | POA: Diagnosis not present

## 2015-11-02 DIAGNOSIS — Z8673 Personal history of transient ischemic attack (TIA), and cerebral infarction without residual deficits: Secondary | ICD-10-CM | POA: Diagnosis not present

## 2015-11-02 DIAGNOSIS — Z7982 Long term (current) use of aspirin: Secondary | ICD-10-CM

## 2015-11-02 DIAGNOSIS — Z66 Do not resuscitate: Secondary | ICD-10-CM | POA: Diagnosis present

## 2015-11-02 DIAGNOSIS — J440 Chronic obstructive pulmonary disease with acute lower respiratory infection: Secondary | ICD-10-CM | POA: Diagnosis not present

## 2015-11-02 DIAGNOSIS — Z808 Family history of malignant neoplasm of other organs or systems: Secondary | ICD-10-CM | POA: Diagnosis not present

## 2015-11-02 DIAGNOSIS — N179 Acute kidney failure, unspecified: Secondary | ICD-10-CM | POA: Diagnosis present

## 2015-11-02 DIAGNOSIS — E860 Dehydration: Secondary | ICD-10-CM | POA: Diagnosis not present

## 2015-11-02 DIAGNOSIS — Z79899 Other long term (current) drug therapy: Secondary | ICD-10-CM

## 2015-11-02 DIAGNOSIS — Z7951 Long term (current) use of inhaled steroids: Secondary | ICD-10-CM

## 2015-11-02 DIAGNOSIS — M1990 Unspecified osteoarthritis, unspecified site: Secondary | ICD-10-CM | POA: Diagnosis present

## 2015-11-02 DIAGNOSIS — K5732 Diverticulitis of large intestine without perforation or abscess without bleeding: Principal | ICD-10-CM | POA: Diagnosis present

## 2015-11-02 DIAGNOSIS — I252 Old myocardial infarction: Secondary | ICD-10-CM

## 2015-11-02 DIAGNOSIS — D649 Anemia, unspecified: Secondary | ICD-10-CM | POA: Diagnosis present

## 2015-11-02 DIAGNOSIS — R112 Nausea with vomiting, unspecified: Secondary | ICD-10-CM | POA: Diagnosis not present

## 2015-11-02 DIAGNOSIS — K4020 Bilateral inguinal hernia, without obstruction or gangrene, not specified as recurrent: Secondary | ICD-10-CM | POA: Diagnosis present

## 2015-11-02 DIAGNOSIS — Z515 Encounter for palliative care: Secondary | ICD-10-CM | POA: Diagnosis not present

## 2015-11-02 DIAGNOSIS — R627 Adult failure to thrive: Secondary | ICD-10-CM | POA: Diagnosis present

## 2015-11-02 DIAGNOSIS — K5792 Diverticulitis of intestine, part unspecified, without perforation or abscess without bleeding: Secondary | ICD-10-CM | POA: Diagnosis not present

## 2015-11-02 DIAGNOSIS — J181 Lobar pneumonia, unspecified organism: Secondary | ICD-10-CM

## 2015-11-02 LAB — COMPREHENSIVE METABOLIC PANEL
ALT: 13 U/L — AB (ref 14–54)
AST: 18 U/L (ref 15–41)
Albumin: 3.7 g/dL (ref 3.5–5.0)
Alkaline Phosphatase: 67 U/L (ref 38–126)
Anion gap: 6 (ref 5–15)
BILIRUBIN TOTAL: 0.4 mg/dL (ref 0.3–1.2)
BUN: 27 mg/dL — ABNORMAL HIGH (ref 6–20)
CHLORIDE: 105 mmol/L (ref 101–111)
CO2: 30 mmol/L (ref 22–32)
CREATININE: 1.35 mg/dL — AB (ref 0.44–1.00)
Calcium: 9.2 mg/dL (ref 8.9–10.3)
GFR, EST AFRICAN AMERICAN: 39 mL/min — AB (ref >60)
GFR, EST NON AFRICAN AMERICAN: 34 mL/min — AB (ref >60)
Glucose, Bld: 85 mg/dL (ref 65–99)
POTASSIUM: 4.2 mmol/L (ref 3.5–5.1)
Sodium: 141 mmol/L (ref 135–145)
TOTAL PROTEIN: 6.7 g/dL (ref 6.5–8.1)

## 2015-11-02 LAB — CBC
HEMATOCRIT: 34.5 % — AB (ref 36.0–46.0)
Hemoglobin: 11.2 g/dL — ABNORMAL LOW (ref 12.0–15.0)
MCH: 31.2 pg (ref 26.0–34.0)
MCHC: 32.5 g/dL (ref 30.0–36.0)
MCV: 96.1 fL (ref 78.0–100.0)
Platelets: 211 10*3/uL (ref 150–400)
RBC: 3.59 MIL/uL — AB (ref 3.87–5.11)
RDW: 13.2 % (ref 11.5–15.5)
WBC: 4.5 10*3/uL (ref 4.0–10.5)

## 2015-11-02 LAB — URINALYSIS, ROUTINE W REFLEX MICROSCOPIC
Bilirubin Urine: NEGATIVE
Glucose, UA: NEGATIVE mg/dL
Ketones, ur: NEGATIVE mg/dL
NITRITE: NEGATIVE
PH: 5.5 (ref 5.0–8.0)
Protein, ur: NEGATIVE mg/dL
SPECIFIC GRAVITY, URINE: 1.025 (ref 1.005–1.030)
Urobilinogen, UA: 0.2 mg/dL (ref 0.0–1.0)

## 2015-11-02 LAB — LIPASE, BLOOD: LIPASE: 27 U/L (ref 11–51)

## 2015-11-02 LAB — URINE MICROSCOPIC-ADD ON

## 2015-11-02 MED ORDER — ONDANSETRON HCL 4 MG/2ML IJ SOLN
4.0000 mg | Freq: Once | INTRAMUSCULAR | Status: AC
  Administered 2015-11-02: 4 mg via INTRAVENOUS
  Filled 2015-11-02: qty 2

## 2015-11-02 MED ORDER — GABAPENTIN 100 MG PO CAPS
100.0000 mg | ORAL_CAPSULE | Freq: Three times a day (TID) | ORAL | Status: DC
  Administered 2015-11-02 – 2015-11-06 (×11): 100 mg via ORAL
  Filled 2015-11-02 (×11): qty 1

## 2015-11-02 MED ORDER — ALUM & MAG HYDROXIDE-SIMETH 200-200-20 MG/5ML PO SUSP: 30.0000 mL | Freq: Four times a day (QID) | ORAL | Status: DC | PRN

## 2015-11-02 MED ORDER — DIAZEPAM 5 MG PO TABS
10.0000 mg | ORAL_TABLET | Freq: Three times a day (TID) | ORAL | Status: DC | PRN
Start: 2015-11-02 — End: 2015-11-06
  Administered 2015-11-03: 10 mg via ORAL
  Filled 2015-11-02: qty 2

## 2015-11-02 MED ORDER — SODIUM CHLORIDE 0.9 % IV SOLN
INTRAVENOUS | Status: DC
  Administered 2015-11-02: 23:00:00 via INTRAVENOUS
  Administered 2015-11-03: 1 mL via INTRAVENOUS
  Administered 2015-11-05: 21:00:00 via INTRAVENOUS

## 2015-11-02 MED ORDER — ENOXAPARIN SODIUM 30 MG/0.3ML ~~LOC~~ SOLN
30.0000 mg | SUBCUTANEOUS | Status: DC
  Administered 2015-11-02: 30 mg via SUBCUTANEOUS
  Filled 2015-11-02: qty 0.3

## 2015-11-02 MED ORDER — ACETAMINOPHEN 650 MG RE SUPP: 650.0000 mg | Freq: Four times a day (QID) | RECTAL | Status: DC | PRN

## 2015-11-02 MED ORDER — METRONIDAZOLE IN NACL 5-0.79 MG/ML-% IV SOLN
500.0000 mg | Freq: Three times a day (TID) | INTRAVENOUS | Status: DC
  Administered 2015-11-03 – 2015-11-06 (×10): 500 mg via INTRAVENOUS
  Filled 2015-11-02 (×10): qty 100

## 2015-11-02 MED ORDER — CIPROFLOXACIN IN D5W 400 MG/200ML IV SOLN: 400.0000 mg | Freq: Two times a day (BID) | INTRAVENOUS | Status: DC

## 2015-11-02 MED ORDER — ONDANSETRON HCL 4 MG PO TABS: 4.0000 mg | ORAL_TABLET | Freq: Four times a day (QID) | ORAL | Status: DC | PRN

## 2015-11-02 MED ORDER — SODIUM CHLORIDE 0.9 % IV SOLN
Freq: Once | INTRAVENOUS | Status: AC
  Administered 2015-11-02: 21:00:00 via INTRAVENOUS

## 2015-11-02 MED ORDER — CIPROFLOXACIN IN D5W 400 MG/200ML IV SOLN
400.0000 mg | Freq: Once | INTRAVENOUS | Status: AC
  Administered 2015-11-02: 400 mg via INTRAVENOUS
  Filled 2015-11-02: qty 200

## 2015-11-02 MED ORDER — ASPIRIN EC 81 MG PO TBEC
81.0000 mg | DELAYED_RELEASE_TABLET | Freq: Every day | ORAL | Status: DC
  Administered 2015-11-03 – 2015-11-06 (×4): 81 mg via ORAL
  Filled 2015-11-02 (×4): qty 1

## 2015-11-02 MED ORDER — HYDROMORPHONE HCL 1 MG/ML IJ SOLN
0.5000 mg | INTRAMUSCULAR | Status: DC | PRN
  Administered 2015-11-03: 1 mg via INTRAVENOUS
  Filled 2015-11-02 (×2): qty 1

## 2015-11-02 MED ORDER — IPRATROPIUM-ALBUTEROL 0.5-2.5 (3) MG/3ML IN SOLN
3.0000 mL | Freq: Three times a day (TID) | RESPIRATORY_TRACT | Status: DC
  Administered 2015-11-03 – 2015-11-06 (×10): 3 mL via RESPIRATORY_TRACT
  Filled 2015-11-02 (×10): qty 3

## 2015-11-02 MED ORDER — OXYCODONE HCL 5 MG PO TABS
10.0000 mg | ORAL_TABLET | Freq: Four times a day (QID) | ORAL | Status: DC | PRN
  Administered 2015-11-03: 10 mg via ORAL
  Filled 2015-11-02 (×2): qty 2

## 2015-11-02 MED ORDER — CETYLPYRIDINIUM CHLORIDE 0.05 % MT LIQD
7.0000 mL | Freq: Two times a day (BID) | OROMUCOSAL | Status: DC
  Administered 2015-11-02 – 2015-11-06 (×8): 7 mL via OROMUCOSAL

## 2015-11-02 MED ORDER — ACETAMINOPHEN 325 MG PO TABS
650.0000 mg | ORAL_TABLET | Freq: Four times a day (QID) | ORAL | Status: DC | PRN
  Administered 2015-11-04: 650 mg via ORAL
  Filled 2015-11-02: qty 2

## 2015-11-02 MED ORDER — MORPHINE SULFATE (PF) 4 MG/ML IV SOLN
4.0000 mg | INTRAVENOUS | Status: DC | PRN
  Administered 2015-11-02: 4 mg via INTRAVENOUS
  Filled 2015-11-02: qty 1

## 2015-11-02 MED ORDER — IPRATROPIUM-ALBUTEROL 0.5-2.5 (3) MG/3ML IN SOLN
3.0000 mL | Freq: Four times a day (QID) | RESPIRATORY_TRACT | Status: DC
  Administered 2015-11-02: 3 mL via RESPIRATORY_TRACT
  Filled 2015-11-02: qty 3

## 2015-11-02 MED ORDER — SODIUM CHLORIDE 0.9 % IV BOLUS (SEPSIS)
500.0000 mL | Freq: Once | INTRAVENOUS | Status: AC
  Administered 2015-11-02: 500 mL via INTRAVENOUS

## 2015-11-02 MED ORDER — METRONIDAZOLE IN NACL 5-0.79 MG/ML-% IV SOLN
500.0000 mg | Freq: Once | INTRAVENOUS | Status: AC
  Administered 2015-11-02: 500 mg via INTRAVENOUS
  Filled 2015-11-02: qty 100

## 2015-11-02 MED ORDER — ONDANSETRON HCL 4 MG/2ML IJ SOLN: 4.0000 mg | Freq: Four times a day (QID) | INTRAMUSCULAR | Status: DC | PRN

## 2015-11-02 MED ORDER — LEVOFLOXACIN IN D5W 750 MG/150ML IV SOLN
750.0000 mg | INTRAVENOUS | Status: DC
  Administered 2015-11-03 – 2015-11-04 (×2): 750 mg via INTRAVENOUS
  Filled 2015-11-02 (×2): qty 150

## 2015-11-02 MED ORDER — TORSEMIDE 20 MG PO TABS
20.0000 mg | ORAL_TABLET | Freq: Every morning | ORAL | Status: DC
  Administered 2015-11-03 – 2015-11-06 (×4): 20 mg via ORAL
  Filled 2015-11-02 (×4): qty 1

## 2015-11-02 MED ORDER — IOHEXOL 300 MG/ML  SOLN
75.0000 mL | Freq: Once | INTRAMUSCULAR | Status: AC | PRN
  Administered 2015-11-02: 75 mL via INTRAVENOUS

## 2015-11-02 NOTE — H&P (Signed)
[facility] Admission History and Physical       [name]:[id number] DOB: 1924/12/26 DOA: 11/02/2015  Referring physician: EDP PCP: Fredirick Maudlin, MD  Specialists:   Chief Complaint: Nausea and Vomiting  HPI: Melanie Lowe is a 81 y.o. female with a history of COPD, HTN, ad recent diagnosis of Diverticulitis on 10/29/2015 who presents tot he ED with complaints of nausea and vomiting and not being able to hold down her oral antibiotics Cipro and Flagyl.   She reports increased pain in her LLQ, but denies any fevers or chills.  A repeat Ct Scan of the ABD was peformed revealed improvement in the sigmoid Diverticulitis however it did reveal a RLL infiltrate.   She was referred for admission.     Review of Systems:  Constitutional: No Weight Loss, No Weight Gain, Night Sweats, Fevers, Chills, Dizziness, Light Headedness, Fatigue, or Generalized Weakness HEENT: No Headaches, Difficulty Swallowing,Tooth/Dental Problems,Sore Throat,  No Sneezing, Rhinitis, Ear Ache, Nasal Congestion, or Post Nasal Drip,  Cardio-vascular:  No Chest pain, Orthopnea, PND, Edema in Lower Extremities, Anasarca, Dizziness, Palpitations  Resp: No Dyspnea, No DOE, No Productive Cough, No Non-Productive Cough, No Hemoptysis, No Wheezing.    GI: No Heartburn, Indigestion, +Abdominal Pain, +Nausea, +Vomiting, Diarrhea, Constipation, Hematemesis, Hematochezia, Melena, Change in Bowel Habits,  Loss of Appetite  GU: No Dysuria, No Change in Color of Urine, No Urgency or Urinary Frequency, No Flank pain.  Musculoskeletal: No Joint Pain or Swelling, No Decreased Range of Motion, No Back Pain.  Neurologic: No Syncope, No Seizures, Muscle Weakness, Paresthesia, Vision Disturbance or Loss, No Diplopia, No Vertigo, No Difficulty Walking,  Skin: No Rash or Lesions. Psych: No Change in Mood or Affect, No Depression or Anxiety, No Memory loss, No Confusion, or Hallucinations   Past Medical History  Diagnosis Date    . COPD (chronic obstructive pulmonary disease) (HCC)   . Hypertension   . Anxiety   . Depression   . Arthritis   . AAA (abdominal aortic aneurysm) (HCC)   . Neuropathy (HCC)   . Leg swelling   . Oxygen dependent   . Myocardial infarction (HCC)   . Pneumonia   . Stroke [facility])      Past Surgical History  Procedure Laterality Date  . Cholecystectomy    . Appendectomy        Prior to Admission medications   Medication Sig Start Date End Date Taking? Authorizing Provider  albuterol (PROVENTIL) (2.5 MG/3ML) 0.083% nebulizer solution Take 2.5 mg by nebulization 3 (three) times daily.    Yes Historical Provider, MD  aspirin EC 81 MG tablet Take 81 mg by mouth daily.   Yes Historical Provider, MD  budesonide-formoterol (SYMBICORT) 160-4.5 MCG/ACT inhaler Inhale 2 puffs into the lungs 2 (two) times daily.    Yes Historical Provider, MD  citalopram (CELEXA) 20 MG tablet Take 20 mg by mouth at bedtime.    Yes Historical Provider, MD  diazepam (VALIUM) 10 MG tablet Take 10 mg by mouth 3 (three) times daily as needed for anxiety.   Yes Historical Provider, MD  gabapentin (NEURONTIN) 100 MG capsule Take 100 mg by mouth 3 (three) times daily.  09/06/15  Yes Historical Provider, MD  metroNIDAZOLE (FLAGYL) 500 MG tablet Take 1 tablet (500 mg total) by mouth 3 (three) times daily. 10/29/15  Yes Tilden Fossa, MD  oxyCODONE-acetaminophen (PERCOCET) 7.5-325 MG tablet Take 1 tablet by mouth 4 (four) times daily as needed. pain 10/13/15  Yes Historical Provider, MD  tiotropium (SPIRIVA) 18 MCG inhalation capsule Place 18 mcg into inhaler and inhale every evening.    Yes Historical Provider, MD  torsemide (DEMADEX) 20 MG tablet Take 20 mg by mouth every morning.    Yes Historical Provider, MD  acetaminophen (TYLENOL) 500 MG tablet Take 250-500 mg by mouth daily as needed for mild pain or moderate pain.     Historical Provider, MD  ciprofloxacin (CIPRO) 500 MG tablet Take 1 tablet (500 mg total) by mouth  daily. Patient not taking: Reported on 11/02/2015 10/29/15   Tilden Fossa, MD  HYDROcodone-acetaminophen [facility]) 7.5-325 MG per tablet Take 1 tablet by mouth every 6 (six) hours as needed for moderate pain. Patient not taking: Reported on 11/02/2015 09/05/14   Gerhard Munch, MD  oxyCODONE-acetaminophen (PERCOCET/ROXICET) 5-325 MG per tablet Take 1 tablet by mouth every 6 (six) hours as needed. Patient not taking: Reported on 11/02/2015 09/12/15   Azalia Bilis, MD     Allergies  Allergen Reactions  . Nsaids     Per patients son due to AAA.  Can only take coated asprin  . Other     FOOD: Spice Triggers acid reflux  . Tomato     Triggers acid reflux    Social History:  reports that she quit smoking about 4 years ago. Her smoking use included Cigarettes. She quit after 50 years of use. She quit smokeless tobacco use about 4 years ago. She reports that she does not drink alcohol or use illicit drugs.    Family History  Problem Relation Age of Onset  . Diabetes Brother   . Cancer Brother     Throat cancer, Heart attack  . Heart disease Brother   . Heart attack Brother   . Cancer Father   . Cancer Sister        Physical Exam:  GEN:  Pleasant  Elderly Well Nourished and Well Developed 81 y.o. Caucasian female examined and in no acute distress; cooperative with exam Filed Vitals:   11/02/15 1800 11/02/15 2030 11/02/15 2100 11/02/15 2133  BP: 108/52 144/82 139/76 128/70  Pulse: 60 45  52  Temp: 97.6 F (36.4 C)     TempSrc: Oral     Resp: 20   20  Height:  (1.549 m)     Weight: 57.153 kg (126 lb)     SpO2: 94% 100%  90%   Blood pressure 128/70, pulse 52, temperature 97.6 F (36.4 C), temperature source Oral, resp. rate 20, height  (1.549 m), weight 57.153 kg (126 lb), SpO2 90 %. PSYCH: She is alert and oriented x4; does not appear anxious does not appear depressed; affect is normal HEENT: Normocephalic and Atraumatic, Mucous membranes pink; PERRLA; EOM intact; Fundi:   Benign;  No scleral icterus, Nares: Patent, Oropharynx: Clear, Edentulous except for 2 Bottom Teeth,    Neck:  FROM, No Cervical Lymphadenopathy nor Thyromegaly or Carotid Bruit; No JVD; Breasts:: Not examined CHEST WALL: No tenderness CHEST: Normal respiration, clear to auscultation bilaterally HEART: Regular rate and rhythm; no murmurs rubs or gallops BACK: No kyphosis or scoliosis; No CVA tenderness ABDOMEN: Positive Bowel Sounds, Soft Mild tenderness of LLQ, No Rebound or Guarding; No Masses, No Organomegaly.   Rectal Exam: Not done EXTREMITIES: No Cyanosis, Clubbing, or Edema; No Ulcerations. Genitalia: not examined PULSES: 2+ and symmetric SKIN: Normal hydration no rash or ulceration CNS:  Alert and Oriented x 4, No Focal Deficits Vascular: pulses palpable throughout    Labs on Admission:  Basic  Metabolic Panel:  Recent Labs Lab 10/29/15 2045 11/02/15 1926  NA 141 141  K 4.1 4.2  CL 104 105  CO2 30 30  GLUCOSE 110* 85  BUN 31* 27*  CREATININE 1.10* 1.35*  CALCIUM 9.6 9.2   Liver Function Tests:  Recent Labs Lab 11/02/15 1926  AST 18  ALT 13*  ALKPHOS 67  BILITOT 0.4  PROT 6.7  ALBUMIN 3.7    Recent Labs Lab 11/02/15 1926  LIPASE 27   No results for input(s): AMMONIA in the last 168 hours. CBC:  Recent Labs Lab 10/29/15 2045 11/02/15 1926  WBC 6.6 4.5  NEUTROABS 3.9  --   HGB 12.6 11.2*  HCT 38.6 34.5*  MCV 97.5 96.1  PLT 226 211   Cardiac Enzymes: No results for input(s): CKTOTAL, CKMB, CKMBINDEX, TROPONINI in the last 168 hours.  BNP (last 3 results)  Recent Labs  04/11/15 2215 09/06/15 2124  BNP 46.0 72.0    ProBNP (last 3 results) No results for input(s): PROBNP in the last 8760 hours.  CBG: No results for input(s): GLUCAP in the last 168 hours.  Radiological Exams on Admission: Ct Abdomen Pelvis W Contrast  11/02/2015  CLINICAL DATA:  Recent diagnosis of sigmoid diverticulitis. Increasing left abdominal pain on  antibiotics. EXAM: CT ABDOMEN AND PELVIS WITH CONTRAST TECHNIQUE: Multidetector CT imaging of the abdomen and pelvis was performed using the standard protocol following bolus administration of intravenous contrast. CONTRAST:  75mL OMNIPAQUE IOHEXOL 300 MG/ML  SOLN COMPARISON:  10/29/2015, 09/12/2015. FINDINGS: There is reduced inflammation around the distal descending and proximal sigmoid colon compared to 10/29/2015. No other inflammatory changes are evident in the abdomen or pelvis. There is extensive colonic diverticulosis. There is no bowel obstruction. Stomach and small bowel appear unremarkable. There is no interval change in the aneurysm of the mid abdominal aorta, measuring 7.5 cm in maximum AP dimension. There is no evidence of aneurysm rupture or retroperitoneal hemorrhage. There are unremarkable appearances of the liver, spleen, pancreas. There is cholecystectomy. There is mild benign appearing nodularity of the left adrenal, unchanged. Kidneys are remarkable only for mass effect on the left kidney 2 2 the abdominal aortic aneurysm, unchanged. No adnexal abnormalities are evident.  No adenopathy.  No ascites. There is mild ground-glass opacity in the posterior right lower lobe which is new. This could represent early infectious infiltrate atelectasis. IMPRESSION: 1. Substantial reduction in the pericolonic inflammatory changes, consistent with resolving diverticulitis. No abscess. No extraluminal air. No bowel obstruction. 2. Unchanged 7.5 cm abdominal aortic aneurysm. No evidence of aneurysm rupture or retroperitoneal hemorrhage. 3. Mild right lower lobe ground-glass opacity, incompletely imaged, possibly early infectious infiltrate. Electronically Signed   By: Ellery Plunk M.D.   On: 11/02/2015 21:36       Assessment/Plan:      81 y.o. female with  Principal Problem:   1.    Acute diverticulitis- given IV Cipro and Flagyl x 1 dose in ED.      IV Levaquin and Flagyl   Active Problems:    2.    RLL pneumonia   IV Levaquin     3.    AKI (acute kidney injury) (HCC)   Gentle IVFs   Monitor BUN/Cr   Hold Next dose of Torsemide     4.    Anemia   Anemia Panel ordered   Check FOBT daily x 3     5.    Essential hypertension   On Torsemide Rx   Monitor  BPs     6.    COPD (chronic obstructive pulmonary disease) (HCC)   DuoNebs every 6 hrs   Monitor O2 sats     7.    Anxiety disorder   On Valium TID     8.    DVT Prophylaxis   Lovenox    Code Status:     FULL CODE       Family Communication:   Grandson at Bedside     Disposition Plan:    Observation Status        Time spent:   27 Minutes      Ron Parker [facility] Pager [phone]   If 7AM -7PM Please Contact the Day Rounding Team MD for [facility]  If 7PM-7AM, Please Contact Night-Floor Coverage  [email or web address] Password [email or web address] 11/02/2015, 10:12 PM     ADDENDUM:   Patient was seen and examined on 11/02/2015

## 2015-11-02 NOTE — ED Notes (Signed)
Patient reports left side abd pain. States she was dx with diverticulitis Monday and given Cipro and Flagyl. Has been taking the Flagyl but not Cipro due to nausea. Also c/o abd swelling and increasing pain.

## 2015-11-02 NOTE — ED Provider Notes (Signed)
CSN: [id number]     Arrival date & time 11/02/15  1754 History   First MD Initiated Contact with Patient 11/02/15 1853     Chief Complaint  Patient presents with  . Abdominal Pain     HPI  Patient presents for reevaluation after being seen 4 days ago and diagnosed with diverticulitis. Discharged on Flagyl and Cipro. Stop taking the Cipro after one dose because was nauseating her. Family members state they were unable to obtain a another prescription for her primary care physician and thus it's only been on the Flagyl. Has had worsening symptoms last 2 days and presents here. No vomiting with nausea. Diarrhea. No fevers today. Left lower quadrant pain.  Past Medical History  Diagnosis Date  . COPD (chronic obstructive pulmonary disease) (HCC)   . Hypertension   . Anxiety   . Depression   . Arthritis   . AAA (abdominal aortic aneurysm) (HCC)   . Neuropathy (HCC)   . Leg swelling   . Oxygen dependent   . Myocardial infarction (HCC)   . Pneumonia   . Stroke [facility])    Past Surgical History  Procedure Laterality Date  . Cholecystectomy    . Appendectomy     Family History  Problem Relation Age of Onset  . Diabetes Brother   . Cancer Brother     Throat cancer, Heart attack  . Heart disease Brother   . Heart attack Brother   . Cancer Father   . Cancer Sister    Social History  Substance Use Topics  . Smoking status: Former Smoker -- 50 years    Types: Cigarettes    Quit date: 12/31/2010  . Smokeless tobacco: Former Neurosurgeon    Quit date: 12/31/2010  . Alcohol Use: No   OB History    No data available     Review of Systems  Constitutional: Negative for fever, chills, diaphoresis, appetite change and fatigue.  HENT: Negative for mouth sores, sore throat and trouble swallowing.   Eyes: Negative for visual disturbance.  Respiratory: Negative for cough, chest tightness, shortness of breath and wheezing.   Cardiovascular: Negative for chest pain.  Gastrointestinal: Positive  for nausea and abdominal pain. Negative for vomiting, diarrhea and abdominal distention.  Endocrine: Negative for polydipsia, polyphagia and polyuria.  Genitourinary: Negative for dysuria, frequency and hematuria.  Musculoskeletal: Negative for gait problem.  Skin: Negative for color change, pallor and rash.  Neurological: Negative for dizziness, syncope, light-headedness and headaches.  Hematological: Does not bruise/bleed easily.  Psychiatric/Behavioral: Negative for behavioral problems and confusion.      Allergies  Nsaids; Other; and Tomato  Home Medications   Prior to Admission medications   Medication Sig Start Date End Date Taking? Authorizing Provider  albuterol (PROVENTIL) (2.5 MG/3ML) 0.083% nebulizer solution Take 2.5 mg by nebulization 3 (three) times daily.    Yes Historical Provider, MD  aspirin EC 81 MG tablet Take 81 mg by mouth daily.   Yes Historical Provider, MD  budesonide-formoterol (SYMBICORT) 160-4.5 MCG/ACT inhaler Inhale 2 puffs into the lungs 2 (two) times daily.    Yes Historical Provider, MD  citalopram (CELEXA) 20 MG tablet Take 20 mg by mouth at bedtime.    Yes Historical Provider, MD  diazepam (VALIUM) 10 MG tablet Take 10 mg by mouth 3 (three) times daily as needed for anxiety.   Yes Historical Provider, MD  gabapentin (NEURONTIN) 100 MG capsule Take 100 mg by mouth 3 (three) times daily.  09/06/15  Yes Historical Provider,  MD  metroNIDAZOLE (FLAGYL) 500 MG tablet Take 1 tablet (500 mg total) by mouth 3 (three) times daily. 10/29/15  Yes Tilden Fossa, MD  oxyCODONE-acetaminophen (PERCOCET) 7.5-325 MG tablet Take 1 tablet by mouth 4 (four) times daily as needed. pain 10/13/15  Yes Historical Provider, MD  tiotropium (SPIRIVA) 18 MCG inhalation capsule Place 18 mcg into inhaler and inhale every evening.    Yes Historical Provider, MD  torsemide (DEMADEX) 20 MG tablet Take 20 mg by mouth every morning.    Yes Historical Provider, MD  acetaminophen (TYLENOL)  500 MG tablet Take 250-500 mg by mouth daily as needed for mild pain or moderate pain.     Historical Provider, MD  ciprofloxacin (CIPRO) 500 MG tablet Take 1 tablet (500 mg total) by mouth daily. Patient not taking: Reported on 11/02/2015 10/29/15   Tilden Fossa, MD  HYDROcodone-acetaminophen [facility]) 7.5-325 MG per tablet Take 1 tablet by mouth every 6 (six) hours as needed for moderate pain. Patient not taking: Reported on 11/02/2015 09/05/14   Gerhard Munch, MD  oxyCODONE-acetaminophen (PERCOCET/ROXICET) 5-325 MG per tablet Take 1 tablet by mouth every 6 (six) hours as needed. Patient not taking: Reported on 11/02/2015 09/12/15   Azalia Bilis, MD   BP 128/70 mmHg  Pulse 52  Temp(Src) 97.6 F (36.4 C) (Oral)  Resp 20  Ht  (1.549 m)  Wt 126 lb (57.153 kg)  BMI 23.82 kg/m2  SpO2 90% Physical Exam  Constitutional: She is oriented to person, place, and time. She appears well-developed and well-nourished. No distress.  HENT:  Head: Normocephalic.  Eyes: Conjunctivae are normal. Pupils are equal, round, and reactive to light. No scleral icterus.  Neck: Normal range of motion. Neck supple. No thyromegaly present.  Cardiovascular: Normal rate and regular rhythm.  Exam reveals no gallop and no friction rub.   No murmur heard. Pulmonary/Chest: Effort normal and breath sounds normal. No respiratory distress. She has no wheezes. She has no rales.  Abdominal: Soft. Bowel sounds are normal. She exhibits no distension. There is no tenderness. There is no rebound.    Musculoskeletal: Normal range of motion.  Neurological: She is alert and oriented to person, place, and time.  Skin: Skin is warm and dry. No rash noted.  Psychiatric: She has a normal mood and affect. Her behavior is normal.    ED Course  Procedures (including critical care time) Labs Review Labs Reviewed  COMPREHENSIVE METABOLIC PANEL - Abnormal; Notable for the following:    BUN 27 (*)    Creatinine, Ser 1.35 (*)    ALT  13 (*)    GFR calc non Af Amer 34 (*)    GFR calc Af Amer 39 (*)    All other components within normal limits  CBC - Abnormal; Notable for the following:    RBC 3.59 (*)    Hemoglobin 11.2 (*)    HCT 34.5 (*)    All other components within normal limits  URINALYSIS, ROUTINE W REFLEX MICROSCOPIC (NOT AT [facility]) - Abnormal; Notable for the following:    Hgb urine dipstick TRACE (*)    Leukocytes, UA TRACE (*)    All other components within normal limits  URINE MICROSCOPIC-ADD ON - Abnormal; Notable for the following:    Squamous Epithelial / LPF FEW (*)    All other components within normal limits  LIPASE, BLOOD    Imaging Review Ct Abdomen Pelvis W Contrast  11/02/2015  CLINICAL DATA:  Recent diagnosis of sigmoid diverticulitis. Increasing left  abdominal pain on antibiotics. EXAM: CT ABDOMEN AND PELVIS WITH CONTRAST TECHNIQUE: Multidetector CT imaging of the abdomen and pelvis was performed using the standard protocol following bolus administration of intravenous contrast. CONTRAST:  35mL OMNIPAQUE IOHEXOL 300 MG/ML  SOLN COMPARISON:  10/29/2015, 09/12/2015. FINDINGS: There is reduced inflammation around the distal descending and proximal sigmoid colon compared to 10/29/2015. No other inflammatory changes are evident in the abdomen or pelvis. There is extensive colonic diverticulosis. There is no bowel obstruction. Stomach and small bowel appear unremarkable. There is no interval change in the aneurysm of the mid abdominal aorta, measuring 7.5 cm in maximum AP dimension. There is no evidence of aneurysm rupture or retroperitoneal hemorrhage. There are unremarkable appearances of the liver, spleen, pancreas. There is cholecystectomy. There is mild benign appearing nodularity of the left adrenal, unchanged. Kidneys are remarkable only for mass effect on the left kidney 2 2 the abdominal aortic aneurysm, unchanged. No adnexal abnormalities are evident.  No adenopathy.  No ascites. There is mild  ground-glass opacity in the posterior right lower lobe which is new. This could represent early infectious infiltrate atelectasis. IMPRESSION: 1. Substantial reduction in the pericolonic inflammatory changes, consistent with resolving diverticulitis. No abscess. No extraluminal air. No bowel obstruction. 2. Unchanged 7.5 cm abdominal aortic aneurysm. No evidence of aneurysm rupture or retroperitoneal hemorrhage. 3. Mild right lower lobe ground-glass opacity, incompletely imaged, possibly early infectious infiltrate. Electronically Signed   By: Ellery Plunk M.D.   On: 11/02/2015 21:36   I have personally reviewed and evaluated these images and lab results as part of my medical decision-making.   EKG Interpretation None      MDM   Final diagnoses:  Diverticulitis of large intestine without perforation or abscess without bleeding    CT scan shows reduction of inflammation around the area of diverticular infection. No complications of perforation or abscess. Will discuss disposition with hospitalist.    Rolland Porter, MD 11/02/15 2144

## 2015-11-03 DIAGNOSIS — Z7189 Other specified counseling: Secondary | ICD-10-CM

## 2015-11-03 DIAGNOSIS — J440 Chronic obstructive pulmonary disease with acute lower respiratory infection: Secondary | ICD-10-CM

## 2015-11-03 DIAGNOSIS — Z515 Encounter for palliative care: Secondary | ICD-10-CM | POA: Insufficient documentation

## 2015-11-03 DIAGNOSIS — J189 Pneumonia, unspecified organism: Secondary | ICD-10-CM

## 2015-11-03 DIAGNOSIS — Z66 Do not resuscitate: Secondary | ICD-10-CM | POA: Insufficient documentation

## 2015-11-03 DIAGNOSIS — K5792 Diverticulitis of intestine, part unspecified, without perforation or abscess without bleeding: Secondary | ICD-10-CM

## 2015-11-03 LAB — CBC
HCT: 33.3 % — ABNORMAL LOW (ref 36.0–46.0)
HEMOGLOBIN: 10.8 g/dL — AB (ref 12.0–15.0)
MCH: 31.8 pg (ref 26.0–34.0)
MCHC: 32.4 g/dL (ref 30.0–36.0)
MCV: 97.9 fL (ref 78.0–100.0)
Platelets: 179 10*3/uL (ref 150–400)
RBC: 3.4 MIL/uL — AB (ref 3.87–5.11)
RDW: 13.4 % (ref 11.5–15.5)
WBC: 4.3 10*3/uL (ref 4.0–10.5)

## 2015-11-03 LAB — BASIC METABOLIC PANEL
Anion gap: 4 — ABNORMAL LOW (ref 5–15)
BUN: 21 mg/dL — ABNORMAL HIGH (ref 6–20)
CALCIUM: 8.7 mg/dL — AB (ref 8.9–10.3)
CHLORIDE: 105 mmol/L (ref 101–111)
CO2: 31 mmol/L (ref 22–32)
Creatinine, Ser: 1.05 mg/dL — ABNORMAL HIGH (ref 0.44–1.00)
GFR calc non Af Amer: 46 mL/min — ABNORMAL LOW (ref >60)
GFR, EST AFRICAN AMERICAN: 53 mL/min — AB (ref >60)
Glucose, Bld: 100 mg/dL — ABNORMAL HIGH (ref 65–99)
Potassium: 3.8 mmol/L (ref 3.5–5.1)
SODIUM: 140 mmol/L (ref 135–145)

## 2015-11-03 MED ORDER — ENOXAPARIN SODIUM 40 MG/0.4ML ~~LOC~~ SOLN
40.0000 mg | SUBCUTANEOUS | Status: DC
  Administered 2015-11-03 – 2015-11-05 (×3): 40 mg via SUBCUTANEOUS
  Filled 2015-11-03 (×3): qty 0.4

## 2015-11-03 NOTE — Care Management Note (Signed)
Case Management Note  Patient Details  Name: LAVONYA LANGER MRN: [id number] Date of Birth: 1924-07-20  Subjective/Objective:                  Pt is from home, lives with family and is ind at baseline. Pt has home O2 through [place]. Pt has a neb machine. Pt has cane, walker, wheelchair and has hospital bed that is supposed to be delivered anytime. Pt has no HH services prior to admission. Palliative has been consulted to see pt.   Action/Plan: Pt plans to return home with self care. Pt has no DME needs and no HH needs are anticipated. No CM needs anticipated.   Expected Discharge Date:      11/05/2015            Expected Discharge Plan:  Home/Self Care  In-House Referral:  Hospice / Palliative Care  Discharge planning Services  CM Consult  Post Acute Care Choice:  NA Choice offered to:  NA  DME Arranged:    DME Agency:     HH Arranged:    HH Agency:     Status of Service:  Completed, signed off  Medicare Important Message Given:    Date Medicare IM Given:    Medicare IM give by:    Date Additional Medicare IM Given:    Additional Medicare Important Message give by:     If discussed at Long Length of Stay Meetings, dates discussed:    Additional Comments:  Malcolm Metro, RN 11/03/2015, 4:04 PM

## 2015-11-03 NOTE — Consult Note (Signed)
Consultation Note Date: 11/03/2015   Patient Name: Melanie Lowe  DOB: 03-May-1924  MRN: [id number]  Age / Sex: 81 y.o., female   PCP: Melanie Baars, MD Referring Physician: Kari Baars, MD  Reason for Consultation: Establishing goals of care and Psychosocial/spiritual support  Palliative Care Assessment and Plan Summary of Established Goals of Care and Medical Treatment Preferences   Clinical Assessment/Narrative: Melanie Lowe is sitting up on the edge of the bed talking with RT as I enter.  She is very pleasant. She tells me about her living situation (son and his fiancee live in her home) and her father who was [place], died when he was 32, and her mother who is half [place] and [place]. She talks about Indians living a long time.  She shares that her Grandfather lived to 105 years and me that she will live to 105 as well.  When I ask her later if she really believes this she tells me "of course".    She shares her faith in God often.  She tells me about a homeless man, Melanie Lowe, she has let move in with them.  He now helps her walk her puppy when she doesn't feel that she can.     We talk about her current health issues and she shares that she was resentful a little about her aneurism.  She shares that she talks to God every night and tells Him that she would "like to stay a little longer", but that it would be ok is she doesn't, "I'm ready".  I ask if she wants me to talk with her son Melanie Lowe and she asks that I come back tomorrow and she will let me know.   Contacts/Participants in Discussion: Primary Decision Maker: Melanie Lowe is able to make her own decisions.  HCPOA: no : she tells me that she would like to complete these forms. Chaplain referral made.  Son, Melanie Lowe lives in Melanie Lowe's home along with his fiancee Lowe.  She also has a daughter living in [place].   Code Status/Advance Care Planning:  DNR  Melanie Lowe tells me that she will live to 52 as her grandfather did.  When I  ask her later if she really believes this she tells me "of course".    Feeding tube; NO  Re hospitalize; YES  She tells me that she would rather pass at home, but that she is ok with passing at any place as she feels that what will happen is God's will.    Symptom Management:   Dilaudid 0.5-1 mg Q 3 hours PRN  Valium 10 mg PO TID PRN  Oxycodone 10 mg PO Q 6 hours PRN  Palliative Prophylaxis: None at this time.   Psycho-social/Spiritual:   Support System:  Son, Melanie Lowe lives in Mrs. Hotz's home along with his fiancee Lowe.  She also has a daughter living in [place]. She talks about how Lowe looks after her.   Desire for further Chaplaincy support: Ongoing.   Prognosis: Unable to determine  Discharge Planning:  Home with Home Health       Chief Complaint:  Nausea and Vomiting History of Present Illness:  Melanie Lowe is a 81 y.o. female with a history of COPD, HTN, ad recent diagnosis of Diverticulitis on 10/29/2015 who presents tot he ED with complaints of nausea and vomiting and not being able to hold down her oral antibiotics Cipro and Flagyl. She reports increased pain in her LLQ, but denies any fevers or chills.  A repeat Ct Scan of the ABD was peformed revealed improvement in the sigmoid Diverticulitis however it did reveal a RLL infiltrate. She has been admitted and is receiving IV antibiotics. .   Primary Diagnoses  Present on Admission:  . Acute diverticulitis . Essential hypertension . COPD (chronic obstructive pulmonary disease) (HCC) . AKI (acute kidney injury) (HCC) . Anemia . RLL pneumonia . Anxiety disorder  Palliative Review of Systems: Denies pain, anxiety, dyspnea that are uncontrolled.  I have reviewed the medical record, interviewed the patient and family, and examined the patient. The following aspects are pertinent.  Past Medical History  Diagnosis Date  . COPD (chronic obstructive pulmonary disease) (HCC)   . Hypertension   .  Anxiety   . Depression   . Arthritis   . AAA (abdominal aortic aneurysm) (HCC)   . Neuropathy (HCC)   . Leg swelling   . Oxygen dependent   . Myocardial infarction (HCC)   . Pneumonia   . Stroke Melanie Lowe)    Social History   Social History  . Marital Status: Widowed    Spouse Name: N/A  . Number of Children: N/A  . Years of Education: N/A   Social History Main Topics  . Smoking status: Former Smoker -- 50 years    Types: Cigarettes    Quit date: 12/31/2010  . Smokeless tobacco: Former Neurosurgeon    Quit date: 12/31/2010  . Alcohol Use: No  . Drug Use: No  . Sexual Activity: No   Other Topics Concern  . None   Social History Narrative   Family History  Problem Relation Age of Onset  . Diabetes Brother   . Cancer Brother     Throat cancer, Heart attack  . Heart disease Brother   . Heart attack Brother   . Cancer Father   . Cancer Sister    Scheduled Meds: . antiseptic oral rinse  7 mL Mouth Rinse BID  . aspirin EC  81 mg Oral Daily  . enoxaparin (LOVENOX) injection  40 mg Subcutaneous Q24H  . gabapentin  100 mg Oral TID  . ipratropium-albuterol  3 mL Nebulization TID  . levofloxacin (LEVAQUIN) IV  750 mg Intravenous Q24H  . metronidazole  500 mg Intravenous Q8H  . torsemide  20 mg Oral q morning - 10a   Continuous Infusions: . sodium chloride 50 mL/hr at 11/02/15 2302   PRN Meds:.acetaminophen **OR** acetaminophen, alum & mag hydroxide-simeth, diazepam, HYDROmorphone (DILAUDID) injection, morphine injection, ondansetron **OR** ondansetron (ZOFRAN) IV, oxyCODONE Medications Prior to Admission:  Prior to Admission medications   Medication Sig Start Date End Date Taking? Authorizing Provider  albuterol (PROVENTIL) (2.5 MG/3ML) 0.083% nebulizer solution Take 2.5 mg by nebulization 3 (three) times daily.    Yes Historical Provider, MD  aspirin EC 81 MG tablet Take 81 mg by mouth daily.   Yes Historical Provider, MD  budesonide-formoterol (SYMBICORT) 160-4.5 MCG/ACT  inhaler Inhale 2 puffs into the lungs 2 (two) times daily.    Yes Historical Provider, MD  citalopram (CELEXA) 20 MG tablet Take 20 mg by mouth at bedtime.    Yes Historical Provider, MD  diazepam (VALIUM) 10 MG tablet Take 10 mg by mouth 3 (three) times daily as needed for anxiety.   Yes Historical Provider, MD  gabapentin (NEURONTIN) 100 MG capsule Take 100 mg by mouth 3 (three) times daily.  09/06/15  Yes Historical Provider, MD  metroNIDAZOLE (FLAGYL) 500 MG tablet Take 1 tablet (500 mg total) by mouth 3 (three) times  daily. 10/29/15  Yes Tilden Fossa, MD  oxyCODONE-acetaminophen (PERCOCET) 7.5-325 MG tablet Take 1 tablet by mouth 4 (four) times daily as needed. pain 10/13/15  Yes Historical Provider, MD  tiotropium (SPIRIVA) 18 MCG inhalation capsule Place 18 mcg into inhaler and inhale every evening.    Yes Historical Provider, MD  torsemide (DEMADEX) 20 MG tablet Take 20 mg by mouth every morning.    Yes Historical Provider, MD  acetaminophen (TYLENOL) 500 MG tablet Take 250-500 mg by mouth daily as needed for mild pain or moderate pain.     Historical Provider, MD  ciprofloxacin (CIPRO) 500 MG tablet Take 1 tablet (500 mg total) by mouth daily. Patient not taking: Reported on 11/02/2015 10/29/15   Tilden Fossa, MD  HYDROcodone-acetaminophen [facility]) 7.5-325 MG per tablet Take 1 tablet by mouth every 6 (six) hours as needed for moderate pain. Patient not taking: Reported on 11/02/2015 09/05/14   Gerhard Munch, MD  oxyCODONE-acetaminophen (PERCOCET/ROXICET) 5-325 MG per tablet Take 1 tablet by mouth every 6 (six) hours as needed. Patient not taking: Reported on 11/02/2015 09/12/15   Azalia Bilis, MD   Allergies  Allergen Reactions  . Nsaids     Per patients son due to AAA.  Can only take coated asprin  . Other     FOOD: Spice Triggers acid reflux  . Tomato     Triggers acid reflux   CBC:    Component Value Date/Time   WBC 4.3 11/03/2015 0601   HGB 10.8* 11/03/2015 0601   HCT 33.3*  11/03/2015 0601   PLT 179 11/03/2015 0601   MCV 97.9 11/03/2015 0601   NEUTROABS 3.9 10/29/2015 2045   LYMPHSABS 1.9 10/29/2015 2045   MONOABS 0.6 10/29/2015 2045   EOSABS 0.2 10/29/2015 2045   BASOSABS 0.0 10/29/2015 2045   Comprehensive Metabolic Panel:    Component Value Date/Time   NA 140 11/03/2015 0601   K 3.8 11/03/2015 0601   CL 105 11/03/2015 0601   CO2 31 11/03/2015 0601   BUN 21* 11/03/2015 0601   CREATININE 1.05* 11/03/2015 0601   GLUCOSE 100* 11/03/2015 0601   CALCIUM 8.7* 11/03/2015 0601   AST 18 11/02/2015 1926   ALT 13* 11/02/2015 1926   ALKPHOS 67 11/02/2015 1926   BILITOT 0.4 11/02/2015 1926   PROT 6.7 11/02/2015 1926   ALBUMIN 3.7 11/02/2015 1926    Physical Exam: Vital Signs: BP 94/58 mmHg  Pulse 64  Temp(Src) 97.8 F (36.6 C) (Oral)  Resp 18  Ht 5\' 1"  (1.549 m)  Wt 61.6 kg (135 lb 12.9 oz)  BMI 25.67 kg/m2  SpO2 97% SpO2: SpO2: 97 % O2 Device: O2 Device: Nasal Cannula O2 Flow Rate: O2 Flow Rate (L/min): 3 L/min Intake/output summary:  Intake/Output Summary (Last 24 hours) at 11/03/15 1555 Last data filed at 11/03/15 1230  Gross per 24 hour  Intake 1201.67 ml  Output      0 ml  Net 1201.67 ml   LBM: Last BM Date: 11/03/15 Baseline Weight: Weight: 57.153 kg (126 lb) Most recent weight: Weight: 61.6 kg (135 lb 12.9 oz)  Exam Findings:  Constitutional:  Elderly, somewhat frail. Makes and keeps eye contact.  Resp: oxygen dependant. Even and non labored.  Cardio:  No edema Psych: calm, pleasant.          Palliative Performance Scale: 60%              Additional Data Reviewed: Recent Labs     11/02/15  1926  11/03/15  0601  WBC  4.5  4.3  HGB  11.2*  10.8*  PLT  211  179  NA  141  140  BUN  27*  21*  CREATININE  1.35*  1.05*     Time In: 1330 Time Out: 1430 Time Total: 60  Greater than 50%  of this time was spent counseling and coordinating care related to the above assessment and plan.  Signed by: Katheran Awe,  NP  Katheran Awe, NP  11/03/2015, 3:55 PM  Please contact Palliative Medicine Team phone at [phone] for questions and concerns.

## 2015-11-03 NOTE — Consult Note (Signed)
   [facility] CM Inpatient Consult   11/03/2015  Melanie Lowe Jan 07, 1924 [id number]   Spoke with patient at bedside regarding [facility] services. Patient wants to speak with her son, [name] and her caregiver about [facility] program. She reports she talks to them before making decisions. Patient given [facility] brochure and contact information for future reference.  Of note, [facility] Care Management services would not replace or interfere with any services that are arranged by inpatient case management or social work. For additional questions or referrals please contact:  Alben Spittle. [name], RN, BSN, [facility]  [facility] Liaison [phone]

## 2015-11-03 NOTE — Progress Notes (Signed)
Pt complaining of left arm pain 10/10. She describes the pain as sharp, stating "feels likes finger nails are scratching down my arm." Spoke with Dr.Mcinnis regarding this issue and he stated to give the pt PRN oxycodone. Will continue to monitor

## 2015-11-03 NOTE — Progress Notes (Signed)
Subjective: She says she feels a little better. She was listed as full code but she says she does not want to be resuscitated. She has interest in palliative care consultation considering her multiple problems.  Objective: Vital signs in last 24 hours: Temp:  [97.6 F (36.4 C)-98.7 F (37.1 C)] 98.1 F (36.7 C) (11/03 0610) Pulse Rate:  [45-62] 62 (11/03 0610) Resp:  [18-20] 18 (11/03 0610) BP: (108-153)/(52-82) 132/68 mmHg (11/03 0610) SpO2:  [90 %-100 %] 96 % (11/03 0736) FiO2 (%):  [0 %] 0 % (11/02 2240) Weight:  [57.153 kg (126 lb)-61.6 kg (135 lb 12.9 oz)] 61.6 kg (135 lb 12.9 oz) (11/02 2255) Weight change:  Last BM Date: 11/02/15  Intake/Output from previous day: 11/02 0701 - 11/03 0700 In: 721.7 [P.O.:240; I.V.:381.7; IV Piggyback:100] Out: -   PHYSICAL EXAM General appearance: alert, cooperative and mild distress Resp: rhonchi bilaterally Cardio: regular rate and rhythm, S1, S2 normal, no murmur, click, rub or gallop GI: Mild left lower quadrant tenderness Extremities: extremities normal, atraumatic, no cyanosis or edema  Lab Results:  Results for orders placed or performed during the hospital encounter of 11/02/15 (from the past 48 hour(s))  Lipase, blood     Status: None   Collection Time: 11/02/15  7:26 PM  Result Value Ref Range   Lipase 27 11 - 51 U/L    Comment: Please note change in reference range.  Comprehensive metabolic panel     Status: Abnormal   Collection Time: 11/02/15  7:26 PM  Result Value Ref Range   Sodium 141 135 - 145 mmol/L   Potassium 4.2 3.5 - 5.1 mmol/L   Chloride 105 101 - 111 mmol/L   CO2 30 22 - 32 mmol/L   Glucose, Bld 85 65 - 99 mg/dL   BUN 27 (H) 6 - 20 mg/dL   Creatinine, Ser 1.35 (H) 0.44 - 1.00 mg/dL   Calcium 9.2 8.9 - 10.3 mg/dL   Total Protein 6.7 6.5 - 8.1 g/dL   Albumin 3.7 3.5 - 5.0 g/dL   AST 18 15 - 41 U/L   ALT 13 (L) 14 - 54 U/L   Alkaline Phosphatase 67 38 - 126 U/L   Total Bilirubin 0.4 0.3 - 1.2 mg/dL   GFR calc non Af Amer 34 (L) >60 mL/min   GFR calc Af Amer 39 (L) >60 mL/min    Comment: (NOTE) The eGFR has been calculated using the CKD EPI equation. This calculation has not been validated in all clinical situations. eGFR's persistently <60 mL/min signify possible Chronic Kidney Disease.    Anion gap 6 5 - 15  CBC     Status: Abnormal   Collection Time: 11/02/15  7:26 PM  Result Value Ref Range   WBC 4.5 4.0 - 10.5 K/uL   RBC 3.59 (L) 3.87 - 5.11 MIL/uL   Hemoglobin 11.2 (L) 12.0 - 15.0 g/dL   HCT 34.5 (L) 36.0 - 46.0 %   MCV 96.1 78.0 - 100.0 fL   MCH 31.2 26.0 - 34.0 pg   MCHC 32.5 30.0 - 36.0 g/dL   RDW 13.2 11.5 - 15.5 %   Platelets 211 150 - 400 K/uL  Urinalysis, Routine w reflex microscopic (not at [facility])     Status: Abnormal   Collection Time: 11/02/15  8:14 PM  Result Value Ref Range   Color, Urine YELLOW YELLOW   APPearance CLEAR CLEAR   Specific Gravity, Urine 1.025 1.005 - 1.030   pH 5.5 5.0 - 8.0  Glucose, UA NEGATIVE NEGATIVE mg/dL   Hgb urine dipstick TRACE (A) NEGATIVE   Bilirubin Urine NEGATIVE NEGATIVE   Ketones, ur NEGATIVE NEGATIVE mg/dL   Protein, ur NEGATIVE NEGATIVE mg/dL   Urobilinogen, UA 0.2 0.0 - 1.0 mg/dL   Nitrite NEGATIVE NEGATIVE   Leukocytes, UA TRACE (A) NEGATIVE  Urine microscopic-add on     Status: Abnormal   Collection Time: 11/02/15  8:14 PM  Result Value Ref Range   Squamous Epithelial / LPF FEW (A) RARE   WBC, UA 0-2 <3 WBC/hpf   RBC / HPF 0-2 <3 RBC/hpf   Bacteria, UA RARE RARE  Basic metabolic panel     Status: Abnormal   Collection Time: 11/03/15  6:01 AM  Result Value Ref Range   Sodium 140 135 - 145 mmol/L   Potassium 3.8 3.5 - 5.1 mmol/L   Chloride 105 101 - 111 mmol/L   CO2 31 22 - 32 mmol/L   Glucose, Bld 100 (H) 65 - 99 mg/dL   BUN 21 (H) 6 - 20 mg/dL   Creatinine, Ser 1.05 (H) 0.44 - 1.00 mg/dL   Calcium 8.7 (L) 8.9 - 10.3 mg/dL   GFR calc non Af Amer 46 (L) >60 mL/min   GFR calc Af Amer 53 (L) >60 mL/min     Comment: (NOTE) The eGFR has been calculated using the CKD EPI equation. This calculation has not been validated in all clinical situations. eGFR's persistently <60 mL/min signify possible Chronic Kidney Disease.    Anion gap 4 (L) 5 - 15  CBC     Status: Abnormal   Collection Time: 11/03/15  6:01 AM  Result Value Ref Range   WBC 4.3 4.0 - 10.5 K/uL   RBC 3.40 (L) 3.87 - 5.11 MIL/uL   Hemoglobin 10.8 (L) 12.0 - 15.0 g/dL   HCT 33.3 (L) 36.0 - 46.0 %   MCV 97.9 78.0 - 100.0 fL   MCH 31.8 26.0 - 34.0 pg   MCHC 32.4 30.0 - 36.0 g/dL   RDW 13.4 11.5 - 15.5 %   Platelets 179 150 - 400 K/uL    ABGS No results for input(s): PHART, PO2ART, TCO2, HCO3 in the last 72 hours.  Invalid input(s): PCO2 CULTURES No results found for this or any previous visit (from the past 240 hour(s)). Studies/Results: Ct Abdomen Pelvis W Contrast  11/02/2015  CLINICAL DATA:  Recent diagnosis of sigmoid diverticulitis. Increasing left abdominal pain on antibiotics. EXAM: CT ABDOMEN AND PELVIS WITH CONTRAST TECHNIQUE: Multidetector CT imaging of the abdomen and pelvis was performed using the standard protocol following bolus administration of intravenous contrast. CONTRAST:  34m OMNIPAQUE IOHEXOL 300 MG/ML  SOLN COMPARISON:  10/29/2015, 09/12/2015. FINDINGS: There is reduced inflammation around the distal descending and proximal sigmoid colon compared to 10/29/2015. No other inflammatory changes are evident in the abdomen or pelvis. There is extensive colonic diverticulosis. There is no bowel obstruction. Stomach and small bowel appear unremarkable. There is no interval change in the aneurysm of the mid abdominal aorta, measuring 7.5 cm in maximum AP dimension. There is no evidence of aneurysm rupture or retroperitoneal hemorrhage. There are unremarkable appearances of the liver, spleen, pancreas. There is cholecystectomy. There is mild benign appearing nodularity of the left adrenal, unchanged. Kidneys are remarkable  only for mass effect on the left kidney 2 2 the abdominal aortic aneurysm, unchanged. No adnexal abnormalities are evident.  No adenopathy.  No ascites. There is mild ground-glass opacity in the posterior right lower lobe which  is new. This could represent early infectious infiltrate atelectasis. IMPRESSION: 1. Substantial reduction in the pericolonic inflammatory changes, consistent with resolving diverticulitis. No abscess. No extraluminal air. No bowel obstruction. 2. Unchanged 7.5 cm abdominal aortic aneurysm. No evidence of aneurysm rupture or retroperitoneal hemorrhage. 3. Mild right lower lobe ground-glass opacity, incompletely imaged, possibly early infectious infiltrate. Electronically Signed   By: Andreas Newport M.D.   On: 11/02/2015 21:36    Medications:  Prior to Admission:  Prescriptions prior to admission  Medication Sig Dispense Refill Last Dose  . albuterol (PROVENTIL) (2.5 MG/3ML) 0.083% nebulizer solution Take 2.5 mg by nebulization 3 (three) times daily.    11/02/2015 at Unknown time  . aspirin EC 81 MG tablet Take 81 mg by mouth daily.   11/02/2015 at Unknown time  . budesonide-formoterol (SYMBICORT) 160-4.5 MCG/ACT inhaler Inhale 2 puffs into the lungs 2 (two) times daily.    11/02/2015 at Unknown time  . citalopram (CELEXA) 20 MG tablet Take 20 mg by mouth at bedtime.    11/01/2015 at Unknown time  . diazepam (VALIUM) 10 MG tablet Take 10 mg by mouth 3 (three) times daily as needed for anxiety.   11/01/2015 at Unknown time  . gabapentin (NEURONTIN) 100 MG capsule Take 100 mg by mouth 3 (three) times daily.    11/02/2015 at Unknown time  . metroNIDAZOLE (FLAGYL) 500 MG tablet Take 1 tablet (500 mg total) by mouth 3 (three) times daily. 21 tablet 0 11/02/2015 at Unknown time  . oxyCODONE-acetaminophen (PERCOCET) 7.5-325 MG tablet Take 1 tablet by mouth 4 (four) times daily as needed. pain  0 11/02/2015 at Unknown time  . tiotropium (SPIRIVA) 18 MCG inhalation capsule Place 18 mcg into  inhaler and inhale every evening.    11/01/2015 at Unknown time  . torsemide (DEMADEX) 20 MG tablet Take 20 mg by mouth every morning.    11/02/2015 at Unknown time  . acetaminophen (TYLENOL) 500 MG tablet Take 250-500 mg by mouth daily as needed for mild pain or moderate pain.    unknown  . ciprofloxacin (CIPRO) 500 MG tablet Take 1 tablet (500 mg total) by mouth daily. (Patient not taking: Reported on 11/02/2015) 7 tablet 0   . HYDROcodone-acetaminophen (NORCO) 7.5-325 MG per tablet Take 1 tablet by mouth every 6 (six) hours as needed for moderate pain. (Patient not taking: Reported on 11/02/2015) 15 tablet 0 06/30/2015 at Unknown time  . oxyCODONE-acetaminophen (PERCOCET/ROXICET) 5-325 MG per tablet Take 1 tablet by mouth every 6 (six) hours as needed. (Patient not taking: Reported on 11/02/2015) 20 tablet 0    Scheduled: . antiseptic oral rinse  7 mL Mouth Rinse BID  . aspirin EC  81 mg Oral Daily  . enoxaparin (LOVENOX) injection  30 mg Subcutaneous Q24H  . gabapentin  100 mg Oral TID  . ipratropium-albuterol  3 mL Nebulization TID  . levofloxacin (LEVAQUIN) IV  750 mg Intravenous Q24H  . metronidazole  500 mg Intravenous Q8H  . torsemide  20 mg Oral q morning - 10a   Continuous: . sodium chloride 50 mL/hr at 11/02/15 2302   WUX:[id number] **OR** acetaminophen, alum & mag hydroxide-simeth, diazepam, HYDROmorphone (DILAUDID) injection, morphine injection, ondansetron **OR** ondansetron (ZOFRAN) IV, oxyCODONE  Assesment: She has right lower lobe pneumonia and acute diverticulitis. She is being treated appropriately for that. She had acute kidney injury that I think is because she is dehydrated. At baseline she has severe COPD and has an aortic aneurysm that is not amenable to surgery. Principal  Problem:   Acute diverticulitis Active Problems:   Anxiety disorder   Essential hypertension   COPD (chronic obstructive pulmonary disease) (HCC)   AKI (acute kidney injury) ([place])   Anemia   RLL  pneumonia    Plan: Continue IV antibiotics. Palliative care consultation    LOS: 1 day   Angeline Trick L 11/03/2015, 9:01 AM

## 2015-11-04 MED ORDER — LEVOFLOXACIN IN D5W 750 MG/150ML IV SOLN
750.0000 mg | INTRAVENOUS | Status: DC
  Administered 2015-11-06: 750 mg via INTRAVENOUS
  Filled 2015-11-04: qty 150

## 2015-11-04 NOTE — Clinical Documentation Improvement (Signed)
Internal Medicine  Please provide a diagnosis associated with the below clinical indicators and update your documentation within the medical record to reflect your response to this query. Thank you!    Chronic Respiratory Failure  Acute Respiratory Failure  Other  Clinically Undetermined  Document any associated diagnoses/conditions.  History of COPD  Supporting Information:  Oxygen Dependent  Please exercise your independent, professional judgment when responding. A specific answer is not anticipated or expected.  Thank You,  Shellee Milo RN, BSN Health Information Management [place] [phone]; Cell: [phone]

## 2015-11-04 NOTE — Care Management Important Message (Signed)
Important Message  Patient Details  Name: Melanie Lowe MRN: [id number] Date of Birth: 06-09-1924   Medicare Important Message Given:  Yes-second notification given    Malcolm Metro, RN 11/04/2015, 1:06 PM

## 2015-11-04 NOTE — Progress Notes (Signed)
Subjective: She says she feels better. She has no new complaints. Her breathing is better. She had an IV infiltrate last night and has had some pain from that  Objective: Vital signs in last 24 hours: Temp:  [97.8 F (36.6 C)-99 F (37.2 C)] 99 F (37.2 C) (11/04 0544) Pulse Rate:  [64-75] 66 (11/04 0544) Resp:  [18] 18 (11/04 0544) BP: (94-112)/(56-76) 112/76 mmHg (11/04 0544) SpO2:  [93 %-99 %] 93 % (11/04 0720) Weight change:  Last BM Date: 11/03/15  Intake/Output from previous day: 11/03 0701 - 11/04 0700 In: 1536.7 [P.O.:720; I.V.:566.7; IV Piggyback:250] Out: -   PHYSICAL EXAM General appearance: alert, cooperative and mild distress Resp: rhonchi bilaterally Cardio: regular rate and rhythm, S1, S2 normal, no murmur, click, rub or gallop GI: soft, non-tender; bowel sounds normal; no masses,  no organomegaly Extremities: extremities normal, atraumatic, no cyanosis or edema  Lab Results:  Results for orders placed or performed during the hospital encounter of 11/02/15 (from the past 48 hour(s))  Lipase, blood     Status: None   Collection Time: 11/02/15  7:26 PM  Result Value Ref Range   Lipase 27 11 - 51 U/L    Comment: Please note change in reference range.  Comprehensive metabolic panel     Status: Abnormal   Collection Time: 11/02/15  7:26 PM  Result Value Ref Range   Sodium 141 135 - 145 mmol/L   Potassium 4.2 3.5 - 5.1 mmol/L   Chloride 105 101 - 111 mmol/L   CO2 30 22 - 32 mmol/L   Glucose, Bld 85 65 - 99 mg/dL   BUN 27 (H) 6 - 20 mg/dL   Creatinine, Ser 1.35 (H) 0.44 - 1.00 mg/dL   Calcium 9.2 8.9 - 10.3 mg/dL   Total Protein 6.7 6.5 - 8.1 g/dL   Albumin 3.7 3.5 - 5.0 g/dL   AST 18 15 - 41 U/L   ALT 13 (L) 14 - 54 U/L   Alkaline Phosphatase 67 38 - 126 U/L   Total Bilirubin 0.4 0.3 - 1.2 mg/dL   GFR calc non Af Amer 34 (L) >60 mL/min   GFR calc Af Amer 39 (L) >60 mL/min    Comment: (NOTE) The eGFR has been calculated using the CKD EPI equation. This  calculation has not been validated in all clinical situations. eGFR's persistently <60 mL/min signify possible Chronic Kidney Disease.    Anion gap 6 5 - 15  CBC     Status: Abnormal   Collection Time: 11/02/15  7:26 PM  Result Value Ref Range   WBC 4.5 4.0 - 10.5 K/uL   RBC 3.59 (L) 3.87 - 5.11 MIL/uL   Hemoglobin 11.2 (L) 12.0 - 15.0 g/dL   HCT 34.5 (L) 36.0 - 46.0 %   MCV 96.1 78.0 - 100.0 fL   MCH 31.2 26.0 - 34.0 pg   MCHC 32.5 30.0 - 36.0 g/dL   RDW 13.2 11.5 - 15.5 %   Platelets 211 150 - 400 K/uL  Urinalysis, Routine w reflex microscopic (not at [facility] ([facility]))     Status: Abnormal   Collection Time: 11/02/15  8:14 PM  Result Value Ref Range   Color, Urine YELLOW YELLOW   APPearance CLEAR CLEAR   Specific Gravity, Urine 1.025 1.005 - 1.030   pH 5.5 5.0 - 8.0   Glucose, UA NEGATIVE NEGATIVE mg/dL   Hgb urine dipstick TRACE (A) NEGATIVE   Bilirubin Urine NEGATIVE NEGATIVE   Ketones, ur NEGATIVE NEGATIVE mg/dL  Protein, ur NEGATIVE NEGATIVE mg/dL   Urobilinogen, UA 0.2 0.0 - 1.0 mg/dL   Nitrite NEGATIVE NEGATIVE   Leukocytes, UA TRACE (A) NEGATIVE  Urine microscopic-add on     Status: Abnormal   Collection Time: 11/02/15  8:14 PM  Result Value Ref Range   Squamous Epithelial / LPF FEW (A) RARE   WBC, UA 0-2 <3 WBC/hpf   RBC / HPF 0-2 <3 RBC/hpf   Bacteria, UA RARE RARE  Basic metabolic panel     Status: Abnormal   Collection Time: 11/03/15  6:01 AM  Result Value Ref Range   Sodium 140 135 - 145 mmol/L   Potassium 3.8 3.5 - 5.1 mmol/L   Chloride 105 101 - 111 mmol/L   CO2 31 22 - 32 mmol/L   Glucose, Bld 100 (H) 65 - 99 mg/dL   BUN 21 (H) 6 - 20 mg/dL   Creatinine, Ser 1.05 (H) 0.44 - 1.00 mg/dL   Calcium 8.7 (L) 8.9 - 10.3 mg/dL   GFR calc non Af Amer 46 (L) >60 mL/min   GFR calc Af Amer 53 (L) >60 mL/min    Comment: (NOTE) The eGFR has been calculated using the CKD EPI equation. This calculation has not been validated in all clinical situations. eGFR's persistently  <60 mL/min signify possible Chronic Kidney Disease.    Anion gap 4 (L) 5 - 15  CBC     Status: Abnormal   Collection Time: 11/03/15  6:01 AM  Result Value Ref Range   WBC 4.3 4.0 - 10.5 K/uL   RBC 3.40 (L) 3.87 - 5.11 MIL/uL   Hemoglobin 10.8 (L) 12.0 - 15.0 g/dL   HCT 33.3 (L) 36.0 - 46.0 %   MCV 97.9 78.0 - 100.0 fL   MCH 31.8 26.0 - 34.0 pg   MCHC 32.4 30.0 - 36.0 g/dL   RDW 13.4 11.5 - 15.5 %   Platelets 179 150 - 400 K/uL    ABGS No results for input(s): PHART, PO2ART, TCO2, HCO3 in the last 72 hours.  Invalid input(s): PCO2 CULTURES No results found for this or any previous visit (from the past 240 hour(s)). Studies/Results: Ct Abdomen Pelvis W Contrast  11/02/2015  CLINICAL DATA:  Recent diagnosis of sigmoid diverticulitis. Increasing left abdominal pain on antibiotics. EXAM: CT ABDOMEN AND PELVIS WITH CONTRAST TECHNIQUE: Multidetector CT imaging of the abdomen and pelvis was performed using the standard protocol following bolus administration of intravenous contrast. CONTRAST:  6m OMNIPAQUE IOHEXOL 300 MG/ML  SOLN COMPARISON:  10/29/2015, 09/12/2015. FINDINGS: There is reduced inflammation around the distal descending and proximal sigmoid colon compared to 10/29/2015. No other inflammatory changes are evident in the abdomen or pelvis. There is extensive colonic diverticulosis. There is no bowel obstruction. Stomach and small bowel appear unremarkable. There is no interval change in the aneurysm of the mid abdominal aorta, measuring 7.5 cm in maximum AP dimension. There is no evidence of aneurysm rupture or retroperitoneal hemorrhage. There are unremarkable appearances of the liver, spleen, pancreas. There is cholecystectomy. There is mild benign appearing nodularity of the left adrenal, unchanged. Kidneys are remarkable only for mass effect on the left kidney 2 2 the abdominal aortic aneurysm, unchanged. No adnexal abnormalities are evident.  No adenopathy.  No ascites. There is  mild ground-glass opacity in the posterior right lower lobe which is new. This could represent early infectious infiltrate atelectasis. IMPRESSION: 1. Substantial reduction in the pericolonic inflammatory changes, consistent with resolving diverticulitis. No abscess. No extraluminal air. No  bowel obstruction. 2. Unchanged 7.5 cm abdominal aortic aneurysm. No evidence of aneurysm rupture or retroperitoneal hemorrhage. 3. Mild right lower lobe ground-glass opacity, incompletely imaged, possibly early infectious infiltrate. Electronically Signed   By: Andreas Newport M.D.   On: 11/02/2015 21:36    Medications:  Prior to Admission:  Prescriptions prior to admission  Medication Sig Dispense Refill Last Dose  . albuterol (PROVENTIL) (2.5 MG/3ML) 0.083% nebulizer solution Take 2.5 mg by nebulization 3 (three) times daily.    11/02/2015 at Unknown time  . aspirin EC 81 MG tablet Take 81 mg by mouth daily.   11/02/2015 at Unknown time  . budesonide-formoterol (SYMBICORT) 160-4.5 MCG/ACT inhaler Inhale 2 puffs into the lungs 2 (two) times daily.    11/02/2015 at Unknown time  . citalopram (CELEXA) 20 MG tablet Take 20 mg by mouth at bedtime.    11/01/2015 at Unknown time  . diazepam (VALIUM) 10 MG tablet Take 10 mg by mouth 3 (three) times daily as needed for anxiety.   11/01/2015 at Unknown time  . gabapentin (NEURONTIN) 100 MG capsule Take 100 mg by mouth 3 (three) times daily.    11/02/2015 at Unknown time  . metroNIDAZOLE (FLAGYL) 500 MG tablet Take 1 tablet (500 mg total) by mouth 3 (three) times daily. 21 tablet 0 11/02/2015 at Unknown time  . oxyCODONE-acetaminophen (PERCOCET) 7.5-325 MG tablet Take 1 tablet by mouth 4 (four) times daily as needed. pain  0 11/02/2015 at Unknown time  . tiotropium (SPIRIVA) 18 MCG inhalation capsule Place 18 mcg into inhaler and inhale every evening.    11/01/2015 at Unknown time  . torsemide (DEMADEX) 20 MG tablet Take 20 mg by mouth every morning.    11/02/2015 at Unknown time   . acetaminophen (TYLENOL) 500 MG tablet Take 250-500 mg by mouth daily as needed for mild pain or moderate pain.    unknown  . ciprofloxacin (CIPRO) 500 MG tablet Take 1 tablet (500 mg total) by mouth daily. (Patient not taking: Reported on 11/02/2015) 7 tablet 0   . HYDROcodone-acetaminophen (NORCO) 7.5-325 MG per tablet Take 1 tablet by mouth every 6 (six) hours as needed for moderate pain. (Patient not taking: Reported on 11/02/2015) 15 tablet 0 06/30/2015 at Unknown time  . oxyCODONE-acetaminophen (PERCOCET/ROXICET) 5-325 MG per tablet Take 1 tablet by mouth every 6 (six) hours as needed. (Patient not taking: Reported on 11/02/2015) 20 tablet 0    Scheduled: . antiseptic oral rinse  7 mL Mouth Rinse BID  . aspirin EC  81 mg Oral Daily  . enoxaparin (LOVENOX) injection  40 mg Subcutaneous Q24H  . gabapentin  100 mg Oral TID  . ipratropium-albuterol  3 mL Nebulization TID  . levofloxacin (LEVAQUIN) IV  750 mg Intravenous Q24H  . metronidazole  500 mg Intravenous Q8H  . torsemide  20 mg Oral q morning - 10a   Continuous: . sodium chloride 1 mL (11/03/15 1745)   TRZ:[id number] **OR** acetaminophen, alum & mag hydroxide-simeth, diazepam, HYDROmorphone (DILAUDID) injection, morphine injection, ondansetron **OR** ondansetron (ZOFRAN) IV, oxyCODONE  Assesment: She was admitted with right lower lobe pneumonia. She has severe COPD at baseline. She had acute kidney injury which seems to be better. She's also had diverticulitis and that has improved. She has a very large abdominal aortic aneurysm and she is not a candidate for repair Principal Problem:   Acute diverticulitis Active Problems:   Anxiety disorder   Essential hypertension   COPD (chronic obstructive pulmonary disease) (HCC)   AKI (acute  kidney injury) ([place])   Anemia   RLL pneumonia   Palliative care encounter   DNR (do not resuscitate) discussion    Plan: Continue current IV antibiotics. Warm compresses to her arm    LOS:  2 days   Per Beagley L 11/04/2015, 8:55 AM

## 2015-11-05 DIAGNOSIS — J9620 Acute and chronic respiratory failure, unspecified whether with hypoxia or hypercapnia: Secondary | ICD-10-CM | POA: Diagnosis present

## 2015-11-05 MED ORDER — LORATADINE 10 MG PO TABS
10.0000 mg | ORAL_TABLET | Freq: Every day | ORAL | Status: DC
  Administered 2015-11-05 – 2015-11-06 (×2): 10 mg via ORAL
  Filled 2015-11-05 (×2): qty 1

## 2015-11-05 NOTE — Progress Notes (Signed)
Subjective: She says she feels a little better but is sneezing a lot. She is still short of breath. She is not having any chest pain  Objective: Vital signs in last 24 hours: Temp:  [98 F (36.7 C)-98.4 F (36.9 C)] 98 F (36.7 C) (11/05 0800) Pulse Rate:  [57-68] 57 (11/05 0800) Resp:  [18-20] 18 (11/05 0800) BP: (81-135)/(54-71) 107/54 mmHg (11/05 0800) SpO2:  [93 %-100 %] 94 % (11/05 0807) Weight change:  Last BM Date:  (11/03/15)  Intake/Output from previous day: 11/04 0701 - 11/05 0700 In: 980 [P.O.:480; IV Piggyback:500] Out: 1000 [Urine:1000]  PHYSICAL EXAM General appearance: alert, cooperative and no distress Resp: rhonchi bilaterally Cardio: regular rate and rhythm, S1, S2 normal, no murmur, click, rub or gallop GI: soft, non-tender; bowel sounds normal; no masses,  no organomegaly Extremities: extremities normal, atraumatic, no cyanosis or edema  Lab Results:  No results found for this or any previous visit (from the past 48 hour(s)).  ABGS No results for input(s): PHART, PO2ART, TCO2, HCO3 in the last 72 hours.  Invalid input(s): PCO2 CULTURES No results found for this or any previous visit (from the past 240 hour(s)). Studies/Results: No results found.  Medications:  Prior to Admission:  Prescriptions prior to admission  Medication Sig Dispense Refill Last Dose  . albuterol (PROVENTIL) (2.5 MG/3ML) 0.083% nebulizer solution Take 2.5 mg by nebulization 3 (three) times daily.    11/02/2015 at Unknown time  . aspirin EC 81 MG tablet Take 81 mg by mouth daily.   11/02/2015 at Unknown time  . budesonide-formoterol (SYMBICORT) 160-4.5 MCG/ACT inhaler Inhale 2 puffs into the lungs 2 (two) times daily.    11/02/2015 at Unknown time  . citalopram (CELEXA) 20 MG tablet Take 20 mg by mouth at bedtime.    11/01/2015 at Unknown time  . diazepam (VALIUM) 10 MG tablet Take 10 mg by mouth 3 (three) times daily as needed for anxiety.   11/01/2015 at Unknown time  . gabapentin  (NEURONTIN) 100 MG capsule Take 100 mg by mouth 3 (three) times daily.    11/02/2015 at Unknown time  . metroNIDAZOLE (FLAGYL) 500 MG tablet Take 1 tablet (500 mg total) by mouth 3 (three) times daily. 21 tablet 0 11/02/2015 at Unknown time  . oxyCODONE-acetaminophen (PERCOCET) 7.5-325 MG tablet Take 1 tablet by mouth 4 (four) times daily as needed. pain  0 11/02/2015 at Unknown time  . tiotropium (SPIRIVA) 18 MCG inhalation capsule Place 18 mcg into inhaler and inhale every evening.    11/01/2015 at Unknown time  . torsemide (DEMADEX) 20 MG tablet Take 20 mg by mouth every morning.    11/02/2015 at Unknown time  . acetaminophen (TYLENOL) 500 MG tablet Take 250-500 mg by mouth daily as needed for mild pain or moderate pain.    unknown  . ciprofloxacin (CIPRO) 500 MG tablet Take 1 tablet (500 mg total) by mouth daily. (Patient not taking: Reported on 11/02/2015) 7 tablet 0   . HYDROcodone-acetaminophen (NORCO) 7.5-325 MG per tablet Take 1 tablet by mouth every 6 (six) hours as needed for moderate pain. (Patient not taking: Reported on 11/02/2015) 15 tablet 0 06/30/2015 at Unknown time  . oxyCODONE-acetaminophen (PERCOCET/ROXICET) 5-325 MG per tablet Take 1 tablet by mouth every 6 (six) hours as needed. (Patient not taking: Reported on 11/02/2015) 20 tablet 0    Scheduled: . antiseptic oral rinse  7 mL Mouth Rinse BID  . aspirin EC  81 mg Oral Daily  . enoxaparin (LOVENOX) injection  40 mg Subcutaneous Q24H  . gabapentin  100 mg Oral TID  . ipratropium-albuterol  3 mL Nebulization TID  . [START ON 11/06/2015] levofloxacin (LEVAQUIN) IV  750 mg Intravenous Q48H  . metronidazole  500 mg Intravenous Q8H  . torsemide  20 mg Oral q morning - 10a   Continuous: . sodium chloride 1 mL (11/03/15 1745)   CZY:[id number] **OR** acetaminophen, alum & mag hydroxide-simeth, diazepam, HYDROmorphone (DILAUDID) injection, morphine injection, ondansetron **OR** ondansetron (ZOFRAN) IV, oxyCODONE  Assesment: She has  pneumonia and diverticulitis. She is slowly improving. She's not ready for discharge yet. She has acute on chronic respiratory failure.  Principal Problem:   Acute diverticulitis Active Problems:   Anxiety disorder   Essential hypertension   COPD (chronic obstructive pulmonary disease) (HCC)   AKI (acute kidney injury) (HCC)   Anemia   RLL pneumonia   Palliative care encounter   DNR (do not resuscitate) discussion   Acute on chronic respiratory failure (HCC)    Plan: Add Claritin potential discharge tomorrow    LOS: 3 days   Melanie Lowe 11/05/2015, 11:00 AM

## 2015-11-06 MED ORDER — LEVOFLOXACIN 500 MG PO TABS: 500.0000 mg | ORAL_TABLET | Freq: Every day | ORAL | Status: DC

## 2015-11-06 NOTE — Progress Notes (Signed)
Subjective: She says she feels better and wants to go home. She still has some sneezing. Her breathing is doing very well  Objective: Vital signs in last 24 hours: Temp:  [98.2 F (36.8 C)-98.6 F (37 C)] 98.3 F (36.8 C) (11/06 0551) Pulse Rate:  [53-70] 68 (11/06 0551) Resp:  [18] 18 (11/06 0551) BP: (86-134)/(51-72) 116/63 mmHg (11/06 0551) SpO2:  [85 %-98 %] 96 % (11/06 0728) Weight change:  Last BM Date: 11/03/15  Intake/Output from previous day: 11/05 0701 - 11/06 0700 In: 1200 [P.O.:1200] Out: -   PHYSICAL EXAM General appearance: alert, cooperative and no distress Resp: rhonchi bilaterally Cardio: regular rate and rhythm, S1, S2 normal, no murmur, click, rub or gallop GI: soft, non-tender; bowel sounds normal; no masses,  no organomegaly Extremities: extremities normal, atraumatic, no cyanosis or edema  Lab Results:  No results found for this or any previous visit (from the past 48 hour(s)).  ABGS No results for input(s): PHART, PO2ART, TCO2, HCO3 in the last 72 hours.  Invalid input(s): PCO2 CULTURES No results found for this or any previous visit (from the past 240 hour(s)). Studies/Results: No results found.  Medications:  Prior to Admission:  Prescriptions prior to admission  Medication Sig Dispense Refill Last Dose  . albuterol (PROVENTIL) (2.5 MG/3ML) 0.083% nebulizer solution Take 2.5 mg by nebulization 3 (three) times daily.    11/02/2015 at Unknown time  . aspirin EC 81 MG tablet Take 81 mg by mouth daily.   11/02/2015 at Unknown time  . budesonide-formoterol (SYMBICORT) 160-4.5 MCG/ACT inhaler Inhale 2 puffs into the lungs 2 (two) times daily.    11/02/2015 at Unknown time  . citalopram (CELEXA) 20 MG tablet Take 20 mg by mouth at bedtime.    11/01/2015 at Unknown time  . diazepam (VALIUM) 10 MG tablet Take 10 mg by mouth 3 (three) times daily as needed for anxiety.   11/01/2015 at Unknown time  . gabapentin (NEURONTIN) 100 MG capsule Take 100 mg by  mouth 3 (three) times daily.    11/02/2015 at Unknown time  . metroNIDAZOLE (FLAGYL) 500 MG tablet Take 1 tablet (500 mg total) by mouth 3 (three) times daily. 21 tablet 0 11/02/2015 at Unknown time  . oxyCODONE-acetaminophen (PERCOCET) 7.5-325 MG tablet Take 1 tablet by mouth 4 (four) times daily as needed. pain  0 11/02/2015 at Unknown time  . tiotropium (SPIRIVA) 18 MCG inhalation capsule Place 18 mcg into inhaler and inhale every evening.    11/01/2015 at Unknown time  . torsemide (DEMADEX) 20 MG tablet Take 20 mg by mouth every morning.    11/02/2015 at Unknown time  . acetaminophen (TYLENOL) 500 MG tablet Take 250-500 mg by mouth daily as needed for mild pain or moderate pain.    unknown  . ciprofloxacin (CIPRO) 500 MG tablet Take 1 tablet (500 mg total) by mouth daily. (Patient not taking: Reported on 11/02/2015) 7 tablet 0   . HYDROcodone-acetaminophen (NORCO) 7.5-325 MG per tablet Take 1 tablet by mouth every 6 (six) hours as needed for moderate pain. (Patient not taking: Reported on 11/02/2015) 15 tablet 0 06/30/2015 at Unknown time  . oxyCODONE-acetaminophen (PERCOCET/ROXICET) 5-325 MG per tablet Take 1 tablet by mouth every 6 (six) hours as needed. (Patient not taking: Reported on 11/02/2015) 20 tablet 0    Scheduled: . antiseptic oral rinse  7 mL Mouth Rinse BID  . aspirin EC  81 mg Oral Daily  . enoxaparin (LOVENOX) injection  40 mg Subcutaneous Q24H  .  gabapentin  100 mg Oral TID  . ipratropium-albuterol  3 mL Nebulization TID  . levofloxacin (LEVAQUIN) IV  750 mg Intravenous Q48H  . loratadine  10 mg Oral Daily  . metronidazole  500 mg Intravenous Q8H  . torsemide  20 mg Oral q morning - 10a   Continuous: . sodium chloride 50 mL/hr at 11/05/15 2035   ZOX:[id number] **OR** acetaminophen, alum & mag hydroxide-simeth, diazepam, HYDROmorphone (DILAUDID) injection, morphine injection, ondansetron **OR** ondansetron (ZOFRAN) IV, oxyCODONE  Assesment: She was admitted with diverticulitis  and pneumonia. At baseline she has severe COPD chronic respiratory failure and a very large aortic aneurysm and she is not a candidate for surgery. She has improved and she is ready for discharge. Principal Problem:   Acute diverticulitis Active Problems:   Anxiety disorder   Essential hypertension   COPD (chronic obstructive pulmonary disease) (HCC)   AKI (acute kidney injury) (HCC)   Anemia   RLL pneumonia   Palliative care encounter   DNR (do not resuscitate) discussion   Acute on chronic respiratory failure (HCC)    Plan: Discharge home today    LOS: 4 days   Gibson Telleria L 11/06/2015, 10:24 AM

## 2015-11-06 NOTE — Progress Notes (Signed)
Patient states understanding of discharge instructions.  

## 2015-11-08 NOTE — Discharge Summary (Signed)
Physician Discharge Summary  Patient ID: Melanie Lowe MRN: [id number] DOB/AGE: June 08, 1924 81 y.o. Primary Care Physician:Jaydis Duchene L, MD Admit date: 11/02/2015 Discharge date: 11/08/2015    Discharge Diagnoses:   Principal Problem:   Acute diverticulitis Active Problems:   Anxiety disorder   Essential hypertension   COPD (chronic obstructive pulmonary disease) (HCC)   AKI (acute kidney injury) (HCC)   Anemia   RLL pneumonia   Palliative care encounter   DNR (do not resuscitate) discussion   Acute on chronic respiratory failure (HCC)     Medication List    STOP taking these medications        ciprofloxacin 500 MG tablet  Commonly known as:  CIPRO     HYDROcodone-acetaminophen 7.5-325 MG tablet  Commonly known as:  NORCO      TAKE these medications        acetaminophen 500 MG tablet  Commonly known as:  TYLENOL  Take 250-500 mg by mouth daily as needed for mild pain or moderate pain.     albuterol (2.5 MG/3ML) 0.083% nebulizer solution  Commonly known as:  PROVENTIL  Take 2.5 mg by nebulization 3 (three) times daily.     aspirin EC 81 MG tablet  Take 81 mg by mouth daily.     budesonide-formoterol 160-4.5 MCG/ACT inhaler  Commonly known as:  SYMBICORT  Inhale 2 puffs into the lungs 2 (two) times daily.     citalopram 20 MG tablet  Commonly known as:  CELEXA  Take 20 mg by mouth at bedtime.     diazepam 10 MG tablet  Commonly known as:  VALIUM  Take 10 mg by mouth 3 (three) times daily as needed for anxiety.     gabapentin 100 MG capsule  Commonly known as:  NEURONTIN  Take 100 mg by mouth 3 (three) times daily.     levofloxacin 500 MG tablet  Commonly known as:  LEVAQUIN  Take 1 tablet (500 mg total) by mouth daily.     metroNIDAZOLE 500 MG tablet  Commonly known as:  FLAGYL  Take 1 tablet (500 mg total) by mouth 3 (three) times daily.     oxyCODONE-acetaminophen 5-325 MG tablet  Commonly known as:  PERCOCET/ROXICET  Take 1 tablet by mouth  every 6 (six) hours as needed.     oxyCODONE-acetaminophen 7.5-325 MG tablet  Commonly known as:  PERCOCET  Take 1 tablet by mouth 4 (four) times daily as needed. pain     tiotropium 18 MCG inhalation capsule  Commonly known as:  SPIRIVA  Place 18 mcg into inhaler and inhale every evening.     torsemide 20 MG tablet  Commonly known as:  DEMADEX  Take 20 mg by mouth every morning.        Discharged Condition: Improved    Consults: Palliative care  Significant Diagnostic Studies: Ct Abdomen Pelvis Wo Contrast  10/29/2015  CLINICAL DATA:  Left lower quadrant abdominal pain radiating to left groin area. Palpable abnormality in the left groin starting about 2 days ago. EXAM: CT ABDOMEN AND PELVIS WITHOUT CONTRAST TECHNIQUE: Multidetector CT imaging of the abdomen and pelvis was performed following the standard protocol without IV contrast. COMPARISON:  CT abdomen dated 09/12/2015. FINDINGS: Patient is status post cholecystectomy. Liver, spleen, pancreas, adrenal glands, and kidneys are unremarkable for a noncontrast study. No renal or ureteral calculi identified. Bladder is unremarkable. Saccular aneurysm of the mid abdominal aorta is unchanged in size or configuration in the short-term interval, measuring 7.1 x 7.5  cm on the recent contrast-enhanced CT. No periaortic hemorrhage or edema. Prominent atherosclerotic calcifications again noted along the walls of the abdominal aorta and branch vessels. Fairly extensive diverticulosis throughout the descending and sigmoid colon. Subtle bowel wall thickening and pericolic inflammation/fluid stranding is present within the upper sigmoid colon consistent with a focal acute diverticulitis. No pericolic abscess collection. No free intraperitoneal air (no evidence of bowel perforation). No large or small bowel dilatation. No evidence of bowel wall inflammation. Appendix is not convincingly seen but there are no inflammatory changes in the right lower  quadrant to suggest acute appendicitis. No abscess collection. No free intraperitoneal air. No enlarged lymph nodes seen. Uterus and adnexal regions are unremarkable. Degenerative changes are seen throughout the slightly scoliotic thoracolumbar spine but no acute osseous abnormality. Scarring/fibrosis noted at each lung base. Are small bilateral inguinal hernias which contain fat only. No mass or significant lymphadenopathy within either groin region. No fluid collection or inflammatory change within either groin region. IMPRESSION: 1. Focal acute diverticulitis within the upper sigmoid colon with associated mild bowel wall thickening and mild pericolic inflammation/fluid stranding (best seen on series 2, images 49 through 54 and on coronal series 3, images 41 through 44). No pericolic abscess. No evidence of bowel perforation. No associated bowel obstruction. 2. Large abdominal aortic aneurysm, better defined on recent contrast enhanced CT of 09/12/2015, not significantly changed in size compared to that previous exam where it measured 7.1 x 7.5 cm. No periaortic hemorrhage or edema. 3. Small bilateral inguinal hernias which contain fat only. No bowel involvement. No mass or lymphadenopathy within either groin region. No fluid collection or inflammatory change within either groin region. Suspect patient's left groin pain is referred from the area of acute diverticulitis in the left lower quadrant. 4. Degenerative changes throughout the thoracolumbar spine, at least moderate in degree. Electronically Signed   By: Bary Richard M.D.   On: 10/29/2015 21:46   Ct Abdomen Pelvis W Contrast  11/02/2015  CLINICAL DATA:  Recent diagnosis of sigmoid diverticulitis. Increasing left abdominal pain on antibiotics. EXAM: CT ABDOMEN AND PELVIS WITH CONTRAST TECHNIQUE: Multidetector CT imaging of the abdomen and pelvis was performed using the standard protocol following bolus administration of intravenous contrast. CONTRAST:   28mL OMNIPAQUE IOHEXOL 300 MG/ML  SOLN COMPARISON:  10/29/2015, 09/12/2015. FINDINGS: There is reduced inflammation around the distal descending and proximal sigmoid colon compared to 10/29/2015. No other inflammatory changes are evident in the abdomen or pelvis. There is extensive colonic diverticulosis. There is no bowel obstruction. Stomach and small bowel appear unremarkable. There is no interval change in the aneurysm of the mid abdominal aorta, measuring 7.5 cm in maximum AP dimension. There is no evidence of aneurysm rupture or retroperitoneal hemorrhage. There are unremarkable appearances of the liver, spleen, pancreas. There is cholecystectomy. There is mild benign appearing nodularity of the left adrenal, unchanged. Kidneys are remarkable only for mass effect on the left kidney 2 2 the abdominal aortic aneurysm, unchanged. No adnexal abnormalities are evident.  No adenopathy.  No ascites. There is mild ground-glass opacity in the posterior right lower lobe which is new. This could represent early infectious infiltrate atelectasis. IMPRESSION: 1. Substantial reduction in the pericolonic inflammatory changes, consistent with resolving diverticulitis. No abscess. No extraluminal air. No bowel obstruction. 2. Unchanged 7.5 cm abdominal aortic aneurysm. No evidence of aneurysm rupture or retroperitoneal hemorrhage. 3. Mild right lower lobe ground-glass opacity, incompletely imaged, possibly early infectious infiltrate. Electronically Signed   By: Rosey Bath.D.  On: 11/02/2015 21:36    Lab Results: Basic Metabolic Panel: No results for input(s): NA, K, CL, CO2, GLUCOSE, BUN, CREATININE, CALCIUM, MG, PHOS in the last 72 hours. Liver Function Tests: No results for input(s): AST, ALT, ALKPHOS, BILITOT, PROT, ALBUMIN in the last 72 hours.   CBC: No results for input(s): WBC, NEUTROABS, HGB, HCT, MCV, PLT in the last 72 hours.  No results found for this or any previous visit (from the past 240  hour(s)).   Hospital Course: This is an 81 year old who came to the emergency department with shortness of breath and abdominal pain. She had been in the emergency department earlier and was diagnosed with diverticulitis which is being treated as an outpatient. Chest x-ray now shows pneumonia. At baseline she has severe end-stage COPD failure to thrive and a very large aortic aneurysm for which she is not felt to be a candidate for surgery. She was treated with intravenous antibiotics and improved. She had consultation with palliative care but has not come to a decision about what she wants to do. She had previously said she wanted to be no code but now she's not sure so she is going to try to have further discussion with palliative care and with her family about exactly what she wants to do. By the time of discharge she was back at baseline and ready for going home  Discharge Exam: Blood pressure 116/63, pulse 68, temperature 98.3 F (36.8 C), temperature source Oral, resp. rate 18, height  (1.549 m), weight 61.6 kg (135 lb 12.9 oz), SpO2 96 %. She is awake and alert. Her chest is clear. Her heart is regular. Abdomen is minimal if any tenderness now  Disposition: Home      Discharge Instructions    Discharge patient    Complete by:  As directed              Signed: Daisia Slomski L   11/08/2015, 7:23 AM

## 2015-11-09 ENCOUNTER — Inpatient Hospital Stay (HOSPITAL_COMMUNITY)
Admission: EM | Admit: 2015-11-09 | Discharge: 2015-11-12 | DRG: 292 | Disposition: A | Discharge status: Home / Self Care | Payer: Commercial Managed Care - HMO | Attending: Pulmonary Disease | Admitting: Pulmonary Disease

## 2015-11-09 ENCOUNTER — Encounter (HOSPITAL_COMMUNITY): Payer: Self-pay

## 2015-11-09 ENCOUNTER — Emergency Department (HOSPITAL_COMMUNITY): Payer: Commercial Managed Care - HMO

## 2015-11-09 DIAGNOSIS — J449 Chronic obstructive pulmonary disease, unspecified: Secondary | ICD-10-CM | POA: Diagnosis present

## 2015-11-09 DIAGNOSIS — Z8249 Family history of ischemic heart disease and other diseases of the circulatory system: Secondary | ICD-10-CM

## 2015-11-09 DIAGNOSIS — I5033 Acute on chronic diastolic (congestive) heart failure: Secondary | ICD-10-CM | POA: Diagnosis present

## 2015-11-09 DIAGNOSIS — Z9981 Dependence on supplemental oxygen: Secondary | ICD-10-CM

## 2015-11-09 DIAGNOSIS — I501 Left ventricular failure: Secondary | ICD-10-CM | POA: Diagnosis not present

## 2015-11-09 DIAGNOSIS — I509 Heart failure, unspecified: Secondary | ICD-10-CM

## 2015-11-09 DIAGNOSIS — I714 Abdominal aortic aneurysm, without rupture: Secondary | ICD-10-CM | POA: Diagnosis present

## 2015-11-09 DIAGNOSIS — R627 Adult failure to thrive: Secondary | ICD-10-CM | POA: Diagnosis present

## 2015-11-09 DIAGNOSIS — M1990 Unspecified osteoarthritis, unspecified site: Secondary | ICD-10-CM | POA: Diagnosis present

## 2015-11-09 DIAGNOSIS — I110 Hypertensive heart disease with heart failure: Secondary | ICD-10-CM | POA: Diagnosis present

## 2015-11-09 DIAGNOSIS — I252 Old myocardial infarction: Secondary | ICD-10-CM | POA: Diagnosis not present

## 2015-11-09 DIAGNOSIS — J9621 Acute and chronic respiratory failure with hypoxia: Secondary | ICD-10-CM

## 2015-11-09 DIAGNOSIS — J9611 Chronic respiratory failure with hypoxia: Secondary | ICD-10-CM | POA: Diagnosis present

## 2015-11-09 DIAGNOSIS — Z8673 Personal history of transient ischemic attack (TIA), and cerebral infarction without residual deficits: Secondary | ICD-10-CM | POA: Diagnosis not present

## 2015-11-09 DIAGNOSIS — Z833 Family history of diabetes mellitus: Secondary | ICD-10-CM

## 2015-11-09 DIAGNOSIS — R0602 Shortness of breath: Secondary | ICD-10-CM | POA: Diagnosis present

## 2015-11-09 DIAGNOSIS — R0902 Hypoxemia: Secondary | ICD-10-CM | POA: Diagnosis not present

## 2015-11-09 DIAGNOSIS — Z87891 Personal history of nicotine dependence: Secondary | ICD-10-CM

## 2015-11-09 DIAGNOSIS — J438 Other emphysema: Secondary | ICD-10-CM

## 2015-11-09 DIAGNOSIS — J441 Chronic obstructive pulmonary disease with (acute) exacerbation: Secondary | ICD-10-CM | POA: Diagnosis not present

## 2015-11-09 DIAGNOSIS — M150 Primary generalized (osteo)arthritis: Secondary | ICD-10-CM | POA: Diagnosis not present

## 2015-11-09 HISTORY — DX: Heart failure, unspecified: I50.9

## 2015-11-09 LAB — CBC WITH DIFFERENTIAL/PLATELET
BASOS ABS: 0 10*3/uL (ref 0.0–0.1)
Basophils Relative: 0 %
EOS ABS: 0.1 10*3/uL (ref 0.0–0.7)
EOS PCT: 3 %
HCT: 34.9 % — ABNORMAL LOW (ref 36.0–46.0)
Hemoglobin: 11.7 g/dL — ABNORMAL LOW (ref 12.0–15.0)
Lymphocytes Relative: 33 %
Lymphs Abs: 1.6 10*3/uL (ref 0.7–4.0)
MCH: 31.9 pg (ref 26.0–34.0)
MCHC: 33.5 g/dL (ref 30.0–36.0)
MCV: 95.1 fL (ref 78.0–100.0)
MONO ABS: 0.5 10*3/uL (ref 0.1–1.0)
Monocytes Relative: 10 %
Neutro Abs: 2.7 10*3/uL (ref 1.7–7.7)
Neutrophils Relative %: 54 %
PLATELETS: 222 10*3/uL (ref 150–400)
RBC: 3.67 MIL/uL — AB (ref 3.87–5.11)
RDW: 13.4 % (ref 11.5–15.5)
WBC: 4.9 10*3/uL (ref 4.0–10.5)

## 2015-11-09 LAB — TROPONIN I

## 2015-11-09 LAB — BASIC METABOLIC PANEL
ANION GAP: 8 (ref 5–15)
BUN: 21 mg/dL — AB (ref 6–20)
CALCIUM: 9.1 mg/dL (ref 8.9–10.3)
CO2: 28 mmol/L (ref 22–32)
Chloride: 98 mmol/L — ABNORMAL LOW (ref 101–111)
Creatinine, Ser: 1.05 mg/dL — ABNORMAL HIGH (ref 0.44–1.00)
GFR calc Af Amer: 53 mL/min — ABNORMAL LOW (ref >60)
GFR, EST NON AFRICAN AMERICAN: 46 mL/min — AB (ref >60)
GLUCOSE: 87 mg/dL (ref 65–99)
POTASSIUM: 3.9 mmol/L (ref 3.5–5.1)
SODIUM: 134 mmol/L — AB (ref 135–145)

## 2015-11-09 LAB — BRAIN NATRIURETIC PEPTIDE: B Natriuretic Peptide: 84 pg/mL (ref 0.0–100.0)

## 2015-11-09 LAB — POC OCCULT BLOOD, ED: FECAL OCCULT BLD: NEGATIVE

## 2015-11-09 MED ORDER — LEVOFLOXACIN 500 MG PO TABS
500.0000 mg | ORAL_TABLET | Freq: Every day | ORAL | Status: DC
  Administered 2015-11-10: 500 mg via ORAL
  Filled 2015-11-09: qty 1

## 2015-11-09 MED ORDER — TIOTROPIUM BROMIDE MONOHYDRATE 18 MCG IN CAPS
18.0000 ug | ORAL_CAPSULE | Freq: Every evening | RESPIRATORY_TRACT | Status: DC
  Administered 2015-11-10 – 2015-11-11 (×2): 18 ug via RESPIRATORY_TRACT
  Filled 2015-11-09: qty 5

## 2015-11-09 MED ORDER — ALBUTEROL SULFATE (2.5 MG/3ML) 0.083% IN NEBU
2.5000 mg | INHALATION_SOLUTION | Freq: Three times a day (TID) | RESPIRATORY_TRACT | Status: DC
  Administered 2015-11-09 – 2015-11-12 (×9): 2.5 mg via RESPIRATORY_TRACT
  Filled 2015-11-09 (×9): qty 3

## 2015-11-09 MED ORDER — GABAPENTIN 100 MG PO CAPS
100.0000 mg | ORAL_CAPSULE | Freq: Three times a day (TID) | ORAL | Status: DC
  Administered 2015-11-09 – 2015-11-12 (×8): 100 mg via ORAL
  Filled 2015-11-09 (×8): qty 1

## 2015-11-09 MED ORDER — FUROSEMIDE 10 MG/ML IJ SOLN
40.0000 mg | Freq: Two times a day (BID) | INTRAMUSCULAR | Status: DC
  Administered 2015-11-10 – 2015-11-12 (×5): 40 mg via INTRAVENOUS
  Filled 2015-11-09 (×5): qty 4

## 2015-11-09 MED ORDER — ASPIRIN EC 81 MG PO TBEC
81.0000 mg | DELAYED_RELEASE_TABLET | Freq: Every day | ORAL | Status: DC
  Administered 2015-11-10 – 2015-11-12 (×3): 81 mg via ORAL
  Filled 2015-11-09 (×3): qty 1

## 2015-11-09 MED ORDER — DIAZEPAM 5 MG PO TABS
10.0000 mg | ORAL_TABLET | Freq: Three times a day (TID) | ORAL | Status: DC | PRN
  Administered 2015-11-10: 10 mg via ORAL
  Filled 2015-11-09: qty 2

## 2015-11-09 MED ORDER — METRONIDAZOLE 500 MG PO TABS
500.0000 mg | ORAL_TABLET | Freq: Three times a day (TID) | ORAL | Status: DC
  Administered 2015-11-09 – 2015-11-12 (×8): 500 mg via ORAL
  Filled 2015-11-09 (×8): qty 1

## 2015-11-09 MED ORDER — CETYLPYRIDINIUM CHLORIDE 0.05 % MT LIQD
7.0000 mL | Freq: Two times a day (BID) | OROMUCOSAL | Status: DC
  Administered 2015-11-09 – 2015-11-12 (×6): 7 mL via OROMUCOSAL

## 2015-11-09 MED ORDER — IPRATROPIUM-ALBUTEROL 0.5-2.5 (3) MG/3ML IN SOLN
3.0000 mL | Freq: Once | RESPIRATORY_TRACT | Status: AC
  Administered 2015-11-09: 3 mL via RESPIRATORY_TRACT
  Filled 2015-11-09: qty 3

## 2015-11-09 MED ORDER — SODIUM CHLORIDE 0.9 % IJ SOLN
3.0000 mL | Freq: Two times a day (BID) | INTRAMUSCULAR | Status: DC
  Administered 2015-11-09 – 2015-11-12 (×6): 3 mL via INTRAVENOUS

## 2015-11-09 MED ORDER — BUDESONIDE-FORMOTEROL FUMARATE 160-4.5 MCG/ACT IN AERO
2.0000 | INHALATION_SPRAY | Freq: Two times a day (BID) | RESPIRATORY_TRACT | Status: DC
  Administered 2015-11-10 – 2015-11-12 (×5): 2 via RESPIRATORY_TRACT
  Filled 2015-11-09: qty 6

## 2015-11-09 MED ORDER — HEPARIN SODIUM (PORCINE) 5000 UNIT/ML IJ SOLN
5000.0000 [IU] | Freq: Three times a day (TID) | INTRAMUSCULAR | Status: DC
  Administered 2015-11-09 – 2015-11-12 (×8): 5000 [IU] via SUBCUTANEOUS
  Filled 2015-11-09 (×9): qty 1

## 2015-11-09 MED ORDER — CITALOPRAM HYDROBROMIDE 20 MG PO TABS
20.0000 mg | ORAL_TABLET | Freq: Every day | ORAL | Status: DC
  Administered 2015-11-09 – 2015-11-11 (×3): 20 mg via ORAL
  Filled 2015-11-09 (×3): qty 1

## 2015-11-09 MED ORDER — NITROGLYCERIN 2 % TD OINT
0.5000 [in_us] | TOPICAL_OINTMENT | Freq: Once | TRANSDERMAL | Status: AC
  Administered 2015-11-09: 0.5 [in_us] via TOPICAL
  Filled 2015-11-09: qty 1

## 2015-11-09 MED ORDER — FUROSEMIDE 10 MG/ML IJ SOLN
40.0000 mg | Freq: Once | INTRAMUSCULAR | Status: AC
  Administered 2015-11-09: 40 mg via INTRAVENOUS
  Filled 2015-11-09: qty 4

## 2015-11-09 MED ORDER — OXYCODONE-ACETAMINOPHEN 5-325 MG PO TABS
1.0000 | ORAL_TABLET | ORAL | Status: DC | PRN
  Administered 2015-11-10 (×2): 1 via ORAL
  Filled 2015-11-09 (×2): qty 1

## 2015-11-09 NOTE — H&P (Signed)
[facility] History and Physical  MANDIE PASQUINELLI HEN:[id number] DOB: [birth year]    PCP:   Fredirick Maudlin, MD   Chief Complaint: SOB  HPI: Melanie Lowe is an 81 y.o. female with hx of COPD, chronic hypoxic respiratory failure, HTN, depression, non surgical candidate for AAA, CAD sp prior MI, hx of CHF, recently discharged (2 days ago) after Tx for diverticulitis on Levoquin and Flagyl, brought to the ER as she was having increase SOB and bilateral leg swelling.  Her CXR showed vascular congestion, and BNP was only slightly elevated to 84, with negative troponin.  Her EKG showed no acute ST T changes, and her electrolytes and renal fx tests were normal.  She has no leukocytosis and her Hb was 11.7 g per dL.  Hospitalist was asked to admit her for mild CHF, and EDP thought she lives alone, but she does not.  She lives with her son and daughter in laws.  As of today, she would like to be full code.   Rewiew of Systems:  Constitutional: Negative for malaise, fever and chills. No significant weight loss or weight gain Eyes: Negative for eye pain, redness and discharge, diplopia, visual changes, or flashes of light. ENMT: Negative for ear pain, hoarseness, nasal congestion, sinus pressure and sore throat. No headaches; tinnitus, drooling, or problem swallowing. Cardiovascular: Negative for chest pain, palpitations, diaphoresis, dyspnea and peripheral edema. ; No orthopnea, PND Respiratory: Negative for cough, hemoptysis, wheezing and stridor. No pleuritic chestpain. Gastrointestinal: Negative for nausea, vomiting, diarrhea, constipation, abdominal pain, melena, blood in stool, hematemesis, jaundice and rectal bleeding.    Genitourinary: Negative for frequency, dysuria, incontinence,flank pain and hematuria; Musculoskeletal: Negative for back pain and neck pain. Negative for swelling and trauma.;  Skin: . Negative for pruritus, rash, abrasions, bruising and skin lesion.; ulcerations Neuro: Negative  for headache, lightheadedness and neck stiffness. Negative for weakness, altered level of consciousness , altered mental status, extremity weakness, burning feet, involuntary movement, seizure and syncope.  Psych: negative for anxiety, depression, insomnia, tearfulness, panic attacks, hallucinations, paranoia, suicidal or homicidal ideation    Past Medical History  Diagnosis Date  . COPD (chronic obstructive pulmonary disease) (HCC)   . Hypertension   . Anxiety   . Depression   . Arthritis   . AAA (abdominal aortic aneurysm) (HCC)   . Neuropathy (HCC)   . Leg swelling   . Oxygen dependent   . Myocardial infarction (HCC)   . Pneumonia   . Stroke (HCC)   . CHF (congestive heart failure) (HCC) 11/09/2015    Past Surgical History  Procedure Laterality Date  . Cholecystectomy    . Appendectomy      Medications:  HOME MEDS: Prior to Admission medications   Medication Sig Start Date End Date Taking? Authorizing Provider  acetaminophen (TYLENOL) 500 MG tablet Take 250-500 mg by mouth daily as needed for mild pain or moderate pain.    Yes Historical Provider, MD  albuterol (PROVENTIL) (2.5 MG/3ML) 0.083% nebulizer solution Take 2.5 mg by nebulization 3 (three) times daily.    Yes Historical Provider, MD  aspirin EC 81 MG tablet Take 81 mg by mouth daily.   Yes Historical Provider, MD  budesonide-formoterol (SYMBICORT) 160-4.5 MCG/ACT inhaler Inhale 2 puffs into the lungs 2 (two) times daily.    Yes Historical Provider, MD  citalopram (CELEXA) 20 MG tablet Take 20 mg by mouth at bedtime.    Yes Historical Provider, MD  diazepam (VALIUM) 10 MG tablet Take 10 mg by  mouth 3 (three) times daily as needed for anxiety.   Yes Historical Provider, MD  gabapentin (NEURONTIN) 100 MG capsule Take 100 mg by mouth 3 (three) times daily.  09/06/15  Yes Historical Provider, MD  levofloxacin (LEVAQUIN) 500 MG tablet Take 1 tablet (500 mg total) by mouth daily. 11/06/15  Yes Kari Baars, MD   metroNIDAZOLE (FLAGYL) 500 MG tablet Take 1 tablet (500 mg total) by mouth 3 (three) times daily. 10/29/15  Yes Tilden Fossa, MD  oxyCODONE-acetaminophen (PERCOCET) 7.5-325 MG tablet Take 1 tablet by mouth 4 (four) times daily as needed. pain 10/13/15  Yes Historical Provider, MD  tiotropium (SPIRIVA) 18 MCG inhalation capsule Place 18 mcg into inhaler and inhale every evening.    Yes Historical Provider, MD  torsemide (DEMADEX) 20 MG tablet Take 20 mg by mouth every morning.    Yes Historical Provider, MD  oxyCODONE-acetaminophen (PERCOCET/ROXICET) 5-325 MG per tablet Take 1 tablet by mouth every 6 (six) hours as needed. Patient not taking: Reported on 11/02/2015 09/12/15   Azalia Bilis, MD     Allergies:  Allergies  Allergen Reactions  . Ciprofloxacin Hcl Nausea And Vomiting  . Nsaids     Per patients son due to AAA.  Can only take coated asprin  . Other     FOOD: Spice Triggers acid reflux  . Tomato     Triggers acid reflux    Social History:   reports that she quit smoking about 4 years ago. Her smoking use included Cigarettes. She quit after 50 years of use. She quit smokeless tobacco use about 4 years ago. She reports that she does not drink alcohol or use illicit drugs.  Family History: Family History  Problem Relation Age of Onset  . Diabetes Brother   . Cancer Brother     Throat cancer, Heart attack  . Heart disease Brother   . Heart attack Brother   . Cancer Father   . Cancer Sister      Physical Exam: Filed Vitals:   11/09/15 1947 11/09/15 2019 11/09/15 2023 11/09/15 2030  BP: 150/85   135/78  Pulse: 56   60  Temp: 98.5 F (36.9 C)     TempSrc: Oral     Resp: 22   18  Height:  (1.549 m)     Weight: 57.153 kg (126 lb)     SpO2: 96% 97% 97% 99%   Blood pressure 135/78, pulse 60, temperature 98.5 F (36.9 C), temperature source Oral, resp. rate 18, height  (1.549 m), weight 57.153 kg (126 lb), SpO2 99 %.  GEN:  Pleasant  patient lying in the  stretcher in no acute distress; cooperative with exam. PSYCH:  alert and oriented x4; does not appear anxious or depressed; affect is appropriate. HEENT: Mucous membranes pink and anicteric; PERRLA; EOM intact; no cervical lymphadenopathy nor thyromegaly or carotid bruit; no JVD; There were no stridor. Neck is very supple. Breasts:: Not examined CHEST WALL: No tenderness CHEST: Normal respiration, She has bilateral rales at both bases.  HEART: Regular rate and rhythm.  There are no murmur, rub, or gallops.   BACK: No kyphosis or scoliosis; no CVA tenderness ABDOMEN: soft and non-tender; no masses, no organomegaly, normal abdominal bowel sounds; no pannus; no intertriginous candida. There is no rebound and no distention. Rectal Exam: Not done EXTREMITIES: No bone or joint deformity; age-appropriate arthropathy of the hands and knees; 1-2+ edema. no ulcerations.  There is no calf tenderness. Genitalia: not examined PULSES: 2+  and symmetric SKIN: Normal hydration no rash or ulceration CNS: Cranial nerves 2-12 grossly intact no focal lateralizing neurologic deficit.  Speech is fluent; uvula elevated with phonation, facial symmetry and tongue midline. DTR are normal bilaterally, cerebella exam is intact, barbinski is negative and strengths are equaled bilaterally.  No sensory loss.   Labs on Admission:  Basic Metabolic Panel:  Recent Labs Lab 11/03/15 0601 11/09/15 1955  NA 140 134*  K 3.8 3.9  CL 105 98*  CO2 31 28  GLUCOSE 100* 87  BUN 21* 21*  CREATININE 1.05* 1.05*  CALCIUM 8.7* 9.1   CBC:  Recent Labs Lab 11/03/15 0601 11/09/15 1955  WBC 4.3 4.9  NEUTROABS  --  2.7  HGB 10.8* 11.7*  HCT 33.3* 34.9*  MCV 97.9 95.1  PLT 179 222   Cardiac Enzymes:  Recent Labs Lab 11/09/15 1955  TROPONINI <0.03   Radiological Exams on Admission: Dg Chest Portable 1 View  11/09/2015  CLINICAL DATA:  Shortness of Breath.  Hypertension. EXAM: PORTABLE CHEST 1 VIEW COMPARISON:   September 06, 2015 FINDINGS: There is scarring in the left base. There is mild bibasilar interstitial edema. There is no airspace consolidation. Heart is mildly enlarged. The pulmonary vascularity is within normal limits. No adenopathy. Aorta is tortuous with atherosclerotic change, stable. Bones are osteoporotic. IMPRESSION: There is cardiomegaly with mild interstitial edema consistent with a degree of congestive heart failure. Stable scarring left base. No airspace consolidation. Prominent aorta with atherosclerotic calcification. This finding is most likely due to chronic hypertensive change. Electronically Signed   By: Bretta Bang III M.D.   On: 11/09/2015 20:34    EKG: Independently reviewed.   Assessment/Plan Present on Admission:  . COPD (chronic obstructive pulmonary disease) (HCC) . Failure to thrive in adult CHF.  #### AAA CAD s/p MI  PLAN: Will admit her for very mild CHF.   She likely just need a slight diuresis.  EDP was concerned that she lives alone, but she does live with her family.  In any event, I placed her back on her home meds, including her antibiotics.  She was discussing her code status with palliative care service last hospitalization, and she was previously a DNR, but as of now, she would like to be a full code.  She is stable and will be admitted to Dr Zadie Cleverly service.  Thank you and Good Day.   Other plans as per orders.  Code Status: FULL Unk Lightning, MD. [facility] Pager [phone] 7pm to 7am.  11/09/2015, 9:45 PM

## 2015-11-09 NOTE — ED Provider Notes (Signed)
CSN: [id number]     Arrival date & time 11/09/15  1938 History   First MD Initiated Contact with Patient 11/09/15 1942     Chief Complaint  Patient presents with  . Shortness of Breath     Patient is a 82 y.o. female presenting with shortness of breath. The history is provided by the patient.  Shortness of Breath Severity:  Moderate Onset quality:  Gradual Duration:  1 day Timing:  Intermittent Progression:  Worsening Chronicity:  Recurrent Relieved by:  Rest Worsened by:  Activity Associated symptoms: no abdominal pain, no chest pain, no fever and no headaches   Patient has h/o COPD, on home oxygen 3LNC, presents with increasing shortness of breath, LE edema and generalized weakness with exertion.  She also reports dark stool earlier and reports recent admission for diverticulitis   Past Medical History  Diagnosis Date  . COPD (chronic obstructive pulmonary disease) (HCC)   . Hypertension   . Anxiety   . Depression   . Arthritis   . AAA (abdominal aortic aneurysm) (HCC)   . Neuropathy (HCC)   . Leg swelling   . Oxygen dependent   . Myocardial infarction (HCC)   . Pneumonia   . Stroke [facility])    Past Surgical History  Procedure Laterality Date  . Cholecystectomy    . Appendectomy     Family History  Problem Relation Age of Onset  . Diabetes Brother   . Cancer Brother     Throat cancer, Heart attack  . Heart disease Brother   . Heart attack Brother   . Cancer Father   . Cancer Sister    Social History  Substance Use Topics  . Smoking status: Former Smoker -- 50 years    Types: Cigarettes    Quit date: 12/31/2010  . Smokeless tobacco: Former Neurosurgeon    Quit date: 12/31/2010  . Alcohol Use: No   OB History    No data available     Review of Systems  Constitutional: Positive for fatigue. Negative for fever.  Respiratory: Positive for shortness of breath.   Cardiovascular: Negative for chest pain.  Gastrointestinal: Positive for blood in stool. Negative for  abdominal pain.  Neurological: Negative for headaches.  All other systems reviewed and are negative.     Allergies  Nsaids; Other; and Tomato  Home Medications   Prior to Admission medications   Medication Sig Start Date End Date Taking? Authorizing Provider  acetaminophen (TYLENOL) 500 MG tablet Take 250-500 mg by mouth daily as needed for mild pain or moderate pain.     Historical Provider, MD  albuterol (PROVENTIL) (2.5 MG/3ML) 0.083% nebulizer solution Take 2.5 mg by nebulization 3 (three) times daily.     Historical Provider, MD  aspirin EC 81 MG tablet Take 81 mg by mouth daily.    Historical Provider, MD  budesonide-formoterol (SYMBICORT) 160-4.5 MCG/ACT inhaler Inhale 2 puffs into the lungs 2 (two) times daily.     Historical Provider, MD  citalopram (CELEXA) 20 MG tablet Take 20 mg by mouth at bedtime.     Historical Provider, MD  diazepam (VALIUM) 10 MG tablet Take 10 mg by mouth 3 (three) times daily as needed for anxiety.    Historical Provider, MD  gabapentin (NEURONTIN) 100 MG capsule Take 100 mg by mouth 3 (three) times daily.  09/06/15   Historical Provider, MD  levofloxacin (LEVAQUIN) 500 MG tablet Take 1 tablet (500 mg total) by mouth daily. 11/06/15   Kari Baars, MD  metroNIDAZOLE (FLAGYL) 500 MG tablet Take 1 tablet (500 mg total) by mouth 3 (three) times daily. 10/29/15   Tilden Fossa, MD  oxyCODONE-acetaminophen (PERCOCET) 7.5-325 MG tablet Take 1 tablet by mouth 4 (four) times daily as needed. pain 10/13/15   Historical Provider, MD  oxyCODONE-acetaminophen (PERCOCET/ROXICET) 5-325 MG per tablet Take 1 tablet by mouth every 6 (six) hours as needed. Patient not taking: Reported on 11/02/2015 09/12/15   Azalia Bilis, MD  tiotropium [facility]) 18 MCG inhalation capsule Place 18 mcg into inhaler and inhale every evening.     Historical Provider, MD  torsemide (DEMADEX) 20 MG tablet Take 20 mg by mouth every morning.     Historical Provider, MD   BP 150/85 mmHg  Pulse  56  Temp(Src) 98.5 F (36.9 C) (Oral)  Resp 22  Ht 5\' 1"  (1.549 m)  Wt 126 lb (57.153 kg)  BMI 23.82 kg/m2  SpO2 96% Physical Exam CONSTITUTIONAL: elderly and frail HEAD: Normocephalic/atraumatic EYES: EOMI ENMT: Mucous membranes moist NECK: supple no meningeal signs SPINE/BACK:entire spine nontender CV: S1/S2 noted, no murmurs/rubs/gallops noted LUNGS: crackles noted bilaterally.  Scattered wheeze noted  ABDOMEN: soft, nontender, no rebound or guarding Rectal - stool is brown/green.  No blood or melena.  Hemoccult negative.  Female nurse Wilkie Aye present for exam GU:no cva tenderness NEURO: Pt is awake/alert/appropriate, moves all extremitiesx4.    EXTREMITIES: pulses normal/equal, full ROM, symmetric pitting edema noted to bilateral LE SKIN: warm, color normal PSYCH: no abnormalities of mood noted, alert and oriented to situation  ED Course  Procedures  Medications  ipratropium-albuterol (DUONEB) 0.5-2.5 (3) MG/3ML nebulizer solution 3 mL (3 mLs Nebulization Given 11/09/15 2019)  furosemide (LASIX) injection 40 mg (40 mg Intravenous Given 11/09/15 2048)  nitroGLYCERIN (NITROGLYN) 2 % ointment 0.5 inch (0.5 inches Topical Given 11/09/15 2047)    8:15 PM Pt here with increased weakness, shortness of breath, LE edema and rectal bleeding She was just in hospital and recently discharged for diverticulitis and also pneumonia Workup pending at this time 9:31 PM Pt reporting increased LE edema and SOB CXR c/w mild CHF She is symptomatic and high risk for worsening if discharged Will admit D/w dr Conley Rolls with triad, will admit to Dr Juanetta Gosling service Labs Review Labs Reviewed  CBC WITH DIFFERENTIAL/PLATELET - Abnormal; Notable for the following:    RBC 3.67 (*)    Hemoglobin 11.7 (*)    HCT 34.9 (*)    All other components within normal limits  BASIC METABOLIC PANEL - Abnormal; Notable for the following:    Sodium 134 (*)    Chloride 98 (*)    BUN 21 (*)    Creatinine, Ser 1.05 (*)     GFR calc non Af Amer 46 (*)    GFR calc Af Amer 53 (*)    All other components within normal limits  TROPONIN I  BRAIN NATRIURETIC PEPTIDE  POC OCCULT BLOOD, ED  POC OCCULT BLOOD, ED    Imaging Review Dg Chest Portable 1 View  11/09/2015  CLINICAL DATA:  Shortness of Breath.  Hypertension. EXAM: PORTABLE CHEST 1 VIEW COMPARISON:  September 06, 2015 FINDINGS: There is scarring in the left base. There is mild bibasilar interstitial edema. There is no airspace consolidation. Heart is mildly enlarged. The pulmonary vascularity is within normal limits. No adenopathy. Aorta is tortuous with atherosclerotic change, stable. Bones are osteoporotic. IMPRESSION: There is cardiomegaly with mild interstitial edema consistent with a degree of congestive heart failure. Stable scarring left base. No airspace  consolidation. Prominent aorta with atherosclerotic calcification. This finding is most likely due to chronic hypertensive change. Electronically Signed   By: Bretta Bang III M.D.   On: 11/09/2015 20:34   I have personally reviewed and evaluated these images and lab results as part of my medical decision-making.   EKG Interpretation   Date/Time:  Wednesday November 09 2015 19:50:57 EST Ventricular Rate:  56 PR Interval:  226 QRS Duration: 89 QT Interval:  440 QTC Calculation: 425 R Axis:   23 Text Interpretation:  Sinus rhythm Ventricular premature complex Prolonged  PR interval Low voltage, precordial leads No significant change since last  tracing Confirmed by Bebe Shaggy  MD, Dorinda Hill ([id number]) on 11/09/2015 8:08:23 PM      MDM   Final diagnoses:  Acute congestive heart failure, unspecified congestive heart failure type [facility])    Nursing notes including past medical history and social history reviewed and considered in documentation xrays/imaging reviewed by myself and considered during evaluation Labs/vital reviewed myself and considered during evaluation     Zadie Rhine,  MD 11/09/15 2132

## 2015-11-09 NOTE — ED Notes (Signed)
Pt reports was recently admitted for diverticulitis and pneumonia.  Reports was discharged Sunday and says hasn't gotten any better.  C/O rectal bleeding, sob, and feet swelling.

## 2015-11-09 NOTE — ED Notes (Signed)
   11/09/15 2009  Abdominal  Gastrointestinal (WDL) X  Abdomen Inspection Soft  RUQ Bowel Sounds Audible  LUQ Bowel Sounds Audible  RLQ Bowel Sounds Audible  LLQ Bowel Sounds Audible  Tenderness Nontender  Passing Flatus Yes  Last Bowel Movement Date 11/09/15  Pt states her stools have been black in color since this am. Hemoccult was negative.

## 2015-11-09 NOTE — ED Notes (Signed)
   11/09/15 2008  Respiratory  Respiratory (WDL) X  Bilateral Breath Sounds Clear  L Breath Sounds Clear  R Breath Sounds Clear  Respiratory Pattern Labored  Chest Assessment Chest expansion symmetrical  O2 Device Nasal Cannula  O2 Flow Rate (L/min) 3 L/min  Pt c/o increased sob, weakness and lower extremity swelling since being discharged from hospital Sunday.

## 2015-11-10 DIAGNOSIS — Z9981 Dependence on supplemental oxygen: Secondary | ICD-10-CM | POA: Diagnosis not present

## 2015-11-10 DIAGNOSIS — Z833 Family history of diabetes mellitus: Secondary | ICD-10-CM | POA: Diagnosis not present

## 2015-11-10 DIAGNOSIS — R627 Adult failure to thrive: Secondary | ICD-10-CM | POA: Diagnosis present

## 2015-11-10 DIAGNOSIS — J449 Chronic obstructive pulmonary disease, unspecified: Secondary | ICD-10-CM | POA: Diagnosis present

## 2015-11-10 DIAGNOSIS — Z87891 Personal history of nicotine dependence: Secondary | ICD-10-CM | POA: Diagnosis not present

## 2015-11-10 DIAGNOSIS — I5033 Acute on chronic diastolic (congestive) heart failure: Secondary | ICD-10-CM | POA: Diagnosis present

## 2015-11-10 DIAGNOSIS — I501 Left ventricular failure: Secondary | ICD-10-CM | POA: Diagnosis not present

## 2015-11-10 DIAGNOSIS — I110 Hypertensive heart disease with heart failure: Secondary | ICD-10-CM | POA: Diagnosis present

## 2015-11-10 DIAGNOSIS — I252 Old myocardial infarction: Secondary | ICD-10-CM | POA: Diagnosis not present

## 2015-11-10 DIAGNOSIS — R0602 Shortness of breath: Secondary | ICD-10-CM | POA: Diagnosis present

## 2015-11-10 DIAGNOSIS — M1990 Unspecified osteoarthritis, unspecified site: Secondary | ICD-10-CM | POA: Diagnosis present

## 2015-11-10 DIAGNOSIS — Z8673 Personal history of transient ischemic attack (TIA), and cerebral infarction without residual deficits: Secondary | ICD-10-CM | POA: Diagnosis not present

## 2015-11-10 DIAGNOSIS — Z8249 Family history of ischemic heart disease and other diseases of the circulatory system: Secondary | ICD-10-CM | POA: Diagnosis not present

## 2015-11-10 DIAGNOSIS — J9611 Chronic respiratory failure with hypoxia: Secondary | ICD-10-CM | POA: Diagnosis present

## 2015-11-10 DIAGNOSIS — I714 Abdominal aortic aneurysm, without rupture: Secondary | ICD-10-CM | POA: Diagnosis present

## 2015-11-10 LAB — CBC
HEMATOCRIT: 32.6 % — AB (ref 36.0–46.0)
Hemoglobin: 10.7 g/dL — ABNORMAL LOW (ref 12.0–15.0)
MCH: 31.2 pg (ref 26.0–34.0)
MCHC: 32.8 g/dL (ref 30.0–36.0)
MCV: 95 fL (ref 78.0–100.0)
PLATELETS: 212 10*3/uL (ref 150–400)
RBC: 3.43 MIL/uL — ABNORMAL LOW (ref 3.87–5.11)
RDW: 13.4 % (ref 11.5–15.5)
WBC: 4.2 10*3/uL (ref 4.0–10.5)

## 2015-11-10 LAB — BASIC METABOLIC PANEL
Anion gap: 7 (ref 5–15)
BUN: 22 mg/dL — AB (ref 6–20)
CHLORIDE: 100 mmol/L — AB (ref 101–111)
CO2: 30 mmol/L (ref 22–32)
CREATININE: 1.12 mg/dL — AB (ref 0.44–1.00)
Calcium: 8.8 mg/dL — ABNORMAL LOW (ref 8.9–10.3)
GFR calc Af Amer: 49 mL/min — ABNORMAL LOW (ref >60)
GFR calc non Af Amer: 42 mL/min — ABNORMAL LOW (ref >60)
GLUCOSE: 71 mg/dL (ref 65–99)
POTASSIUM: 3.6 mmol/L (ref 3.5–5.1)
SODIUM: 137 mmol/L (ref 135–145)

## 2015-11-10 MED ORDER — LEVOFLOXACIN 500 MG PO TABS
500.0000 mg | ORAL_TABLET | ORAL | Status: DC
  Administered 2015-11-12: 500 mg via ORAL
  Filled 2015-11-10: qty 1

## 2015-11-10 NOTE — Consult Note (Signed)
   [facility] CM Inpatient Consult   11/10/2015  ELON WEITH 16-Jul-1924 [id number]  Spoke with patient at bedside regarding [facility] services as RNCM had discussed program with patient last week and she wanted to speak with her son, [name]. Patient requested RNCM contact son, [name], call placed from patient room to her son. Spoke with [name] about [facility] program services, he requests RNCM to leave another brochure, when he is visiting patient later today, he will review with patient and make a decision.  Patient given another [facility] brochure and RNCM contact information for review with her son later.  Of note, [facility] Care Management services would not replace or interfere with any services that are arranged by inpatient case management or social work. For additional questions or referrals please contact:  Alben Spittle. [name], RN, BSN, [facility]  [facility] Liaison [phone]

## 2015-11-10 NOTE — Progress Notes (Signed)
Subjective: She was brought into observation for heart failure. When I discussed all of this with her she expresses surprise that she has a bad heart although I have discussed her cardiac pulmonary and peripheral arterial issues with her on multiple occasions. She did have some leg swelling. She was found to have vascular congestion but negative troponin. She is felt to have mild heart failure. She brought up Omar and after discussion she does not want to be placed on life support. She says that she saw her husband do that and he suffered and she does not want that personally.  Objective: Vital signs in last 24 hours: Temp:  [97.6 F (36.4 C)-98.5 F (36.9 C)] 97.6 F (36.4 C) (11/10 0617) Pulse Rate:  [50-60] 52 (11/10 0617) Resp:  [15-22] 18 (11/10 0617) BP: (116-150)/(70-85) 127/75 mmHg (11/10 0617) SpO2:  [96 %-99 %] 97 % (11/10 0753) Weight:  [57.153 kg (126 lb)-60.827 kg (134 lb 1.6 oz)] 59.421 kg (131 lb) (11/10 0617) Weight change:  Last BM Date: 11/10/15  Intake/Output from previous day: 11/09 0701 - 11/10 0700 In: 480 [P.O.:480] Out: 1475 [Urine:1475]  PHYSICAL EXAM General appearance: alert, cooperative and mild distress Resp: rhonchi bilaterally Cardio: regular rate and rhythm, S1, S2 normal, no murmur, click, rub or gallop GI: soft, non-tender; bowel sounds normal; no masses,  no organomegaly Extremities: She has 1+ edema of the extremities  Lab Results:  Results for orders placed or performed during the hospital encounter of 11/09/15 (from the past 48 hour(s))  CBC with Differential/Platelet     Status: Abnormal   Collection Time: 11/09/15  7:55 PM  Result Value Ref Range   WBC 4.9 4.0 - 10.5 K/uL   RBC 3.67 (L) 3.87 - 5.11 MIL/uL   Hemoglobin 11.7 (L) 12.0 - 15.0 g/dL   HCT 34.9 (L) 36.0 - 46.0 %   MCV 95.1 78.0 - 100.0 fL   MCH 31.9 26.0 - 34.0 pg   MCHC 33.5 30.0 - 36.0 g/dL   RDW 13.4 11.5 - 15.5 %   Platelets 222 150 - 400 K/uL   Neutrophils  Relative % 54 %   Neutro Abs 2.7 1.7 - 7.7 K/uL   Lymphocytes Relative 33 %   Lymphs Abs 1.6 0.7 - 4.0 K/uL   Monocytes Relative 10 %   Monocytes Absolute 0.5 0.1 - 1.0 K/uL   Eosinophils Relative 3 %   Eosinophils Absolute 0.1 0.0 - 0.7 K/uL   Basophils Relative 0 %   Basophils Absolute 0.0 0.0 - 0.1 K/uL  Basic metabolic panel     Status: Abnormal   Collection Time: 11/09/15  7:55 PM  Result Value Ref Range   Sodium 134 (L) 135 - 145 mmol/L   Potassium 3.9 3.5 - 5.1 mmol/L   Chloride 98 (L) 101 - 111 mmol/L   CO2 28 22 - 32 mmol/L   Glucose, Bld 87 65 - 99 mg/dL   BUN 21 (H) 6 - 20 mg/dL   Creatinine, Ser 1.05 (H) 0.44 - 1.00 mg/dL   Calcium 9.1 8.9 - 10.3 mg/dL   GFR calc non Af Amer 46 (L) >60 mL/min   GFR calc Af Amer 53 (L) >60 mL/min    Comment: (NOTE) The eGFR has been calculated using the CKD EPI equation. This calculation has not been validated in all clinical situations. eGFR's persistently <60 mL/min signify possible Chronic Kidney Disease.    Anion gap 8 5 - 15  Troponin I  Status: None   Collection Time: 11/09/15  7:55 PM  Result Value Ref Range   Troponin I <0.03 <0.031 ng/mL    Comment:        NO INDICATION OF MYOCARDIAL INJURY.   Brain natriuretic peptide     Status: None   Collection Time: 11/09/15  7:55 PM  Result Value Ref Range   B Natriuretic Peptide 84.0 0.0 - 100.0 pg/mL  POC occult blood, ED     Status: None   Collection Time: 11/09/15  8:06 PM  Result Value Ref Range   Fecal Occult Bld NEGATIVE NEGATIVE  Basic metabolic panel     Status: Abnormal   Collection Time: 11/10/15  5:51 AM  Result Value Ref Range   Sodium 137 135 - 145 mmol/L   Potassium 3.6 3.5 - 5.1 mmol/L   Chloride 100 (L) 101 - 111 mmol/L   CO2 30 22 - 32 mmol/L   Glucose, Bld 71 65 - 99 mg/dL   BUN 22 (H) 6 - 20 mg/dL   Creatinine, Ser 1.12 (H) 0.44 - 1.00 mg/dL   Calcium 8.8 (L) 8.9 - 10.3 mg/dL   GFR calc non Af Amer 42 (L) >60 mL/min   GFR calc Af Amer 49 (L)  >60 mL/min    Comment: (NOTE) The eGFR has been calculated using the CKD EPI equation. This calculation has not been validated in all clinical situations. eGFR's persistently <60 mL/min signify possible Chronic Kidney Disease.    Anion gap 7 5 - 15  CBC     Status: Abnormal   Collection Time: 11/10/15  5:51 AM  Result Value Ref Range   WBC 4.2 4.0 - 10.5 K/uL   RBC 3.43 (L) 3.87 - 5.11 MIL/uL   Hemoglobin 10.7 (L) 12.0 - 15.0 g/dL   HCT 32.6 (L) 36.0 - 46.0 %   MCV 95.0 78.0 - 100.0 fL   MCH 31.2 26.0 - 34.0 pg   MCHC 32.8 30.0 - 36.0 g/dL   RDW 13.4 11.5 - 15.5 %   Platelets 212 150 - 400 K/uL    ABGS No results for input(s): PHART, PO2ART, TCO2, HCO3 in the last 72 hours.  Invalid input(s): PCO2 CULTURES No results found for this or any previous visit (from the past 240 hour(s)). Studies/Results: Dg Chest Portable 1 View  11/09/2015  CLINICAL DATA:  Shortness of Breath.  Hypertension. EXAM: PORTABLE CHEST 1 VIEW COMPARISON:  September 06, 2015 FINDINGS: There is scarring in the left base. There is mild bibasilar interstitial edema. There is no airspace consolidation. Heart is mildly enlarged. The pulmonary vascularity is within normal limits. No adenopathy. Aorta is tortuous with atherosclerotic change, stable. Bones are osteoporotic. IMPRESSION: There is cardiomegaly with mild interstitial edema consistent with a degree of congestive heart failure. Stable scarring left base. No airspace consolidation. Prominent aorta with atherosclerotic calcification. This finding is most likely due to chronic hypertensive change. Electronically Signed   By: Lowella Grip III M.D.   On: 11/09/2015 20:34    Medications:  Prior to Admission:  Prescriptions prior to admission  Medication Sig Dispense Refill Last Dose  . acetaminophen (TYLENOL) 500 MG tablet Take 250-500 mg by mouth daily as needed for mild pain or moderate pain.    11/08/2015  . albuterol (PROVENTIL) (2.5 MG/3ML) 0.083%  nebulizer solution Take 2.5 mg by nebulization 3 (three) times daily.    11/09/2015 at Unknown time  . aspirin EC 81 MG tablet Take 81 mg by mouth daily.  11/09/2015 at Unknown time  . budesonide-formoterol (SYMBICORT) 160-4.5 MCG/ACT inhaler Inhale 2 puffs into the lungs 2 (two) times daily.    11/09/2015 at Unknown time  . citalopram (CELEXA) 20 MG tablet Take 20 mg by mouth at bedtime.    11/08/2015 at Unknown time  . diazepam (VALIUM) 10 MG tablet Take 10 mg by mouth 3 (three) times daily as needed for anxiety.   11/09/2015 at Unknown time  . gabapentin (NEURONTIN) 100 MG capsule Take 100 mg by mouth 3 (three) times daily.    11/08/2015  . levofloxacin (LEVAQUIN) 500 MG tablet Take 1 tablet (500 mg total) by mouth daily. 6 tablet 0 11/09/2015 at Unknown time  . metroNIDAZOLE (FLAGYL) 500 MG tablet Take 1 tablet (500 mg total) by mouth 3 (three) times daily. 21 tablet 0 11/09/2015 at Unknown time  . oxyCODONE-acetaminophen (PERCOCET) 7.5-325 MG tablet Take 1 tablet by mouth 4 (four) times daily as needed. pain  0 11/09/2015 at Unknown time  . tiotropium (SPIRIVA) 18 MCG inhalation capsule Place 18 mcg into inhaler and inhale every evening.    11/09/2015 at Unknown time  . torsemide (DEMADEX) 20 MG tablet Take 20 mg by mouth every morning.    11/09/2015 at Unknown time  . oxyCODONE-acetaminophen (PERCOCET/ROXICET) 5-325 MG per tablet Take 1 tablet by mouth every 6 (six) hours as needed. (Patient not taking: Reported on 11/02/2015) 20 tablet 0    Scheduled: . albuterol  2.5 mg Nebulization TID  . antiseptic oral rinse  7 mL Mouth Rinse BID  . aspirin EC  81 mg Oral Daily  . budesonide-formoterol  2 puff Inhalation BID  . citalopram  20 mg Oral QHS  . furosemide  40 mg Intravenous BID  . gabapentin  100 mg Oral TID  . heparin  5,000 Units Subcutaneous 3 times per day  . levofloxacin  500 mg Oral Daily  . metroNIDAZOLE  500 mg Oral TID  . sodium chloride  3 mL Intravenous Q12H  . tiotropium  18 mcg  Inhalation QPM   Continuous:  OVF:IEPPIRJJ, oxyCODONE-acetaminophen  Assesment: She was admitted with heart failure. She is going to be treated with intravenous diuresis. At baseline she has coronary artery occlusive disease, heart failure, COPD which is severe, peripheral arterial disease with a very large abdominal aortic aneurysm for which she is not felt to be a surgical candidate. She had just been in the hospital with diverticulitis and pneumonia but does appear to have cleared Principal Problem:   CHF (congestive heart failure) ([place]) Active Problems:   COPD (chronic obstructive pulmonary disease) ([place])   Failure to thrive in adult    Plan: IV diuresis. Her last echocardiogram was in 2012 so I will repeat that.      Tashaya Ancrum L 11/10/2015, 8:31 AM

## 2015-11-10 NOTE — Progress Notes (Signed)
Brought information packet for Advance Directives and discussed the process. She wanted to discuss with her son. Gave her my contact information also.

## 2015-11-10 NOTE — Care Management Note (Signed)
Case Management Note  Patient Details  Name: Melanie Lowe MRN: [id number] Date of Birth: 1924/03/03  Subjective/Objective:                  Pt admitted from home with CHF. Pt 's son lives with pt and pt will return home with family at discharge. Pt is independent with ADL's. Pt has home O2 with [employer] and has a neb machine for home use.  Action/Plan: May benefit from [facility] RN at discharge. Will continue to follow for discharge planning needs.  Expected Discharge Date:  11/11/15               Expected Discharge Plan:  Home w Home Health Services  In-House Referral:  NA  Discharge planning Services  CM Consult  Post Acute Care Choice:  NA Choice offered to:  NA  DME Arranged:    DME Agency:     HH Arranged:    HH Agency:     Status of Service:  In process, will continue to follow  Medicare Important Message Given:    Date Medicare IM Given:    Medicare IM give by:    Date Additional Medicare IM Given:    Additional Medicare Important Message give by:     If discussed at Long Length of Stay Meetings, dates discussed:    Additional Comments:  Cheryl Flash, RN 11/10/2015, 11:30 AM

## 2015-11-11 ENCOUNTER — Inpatient Hospital Stay (HOSPITAL_COMMUNITY): Payer: Commercial Managed Care - HMO

## 2015-11-11 DIAGNOSIS — I501 Left ventricular failure: Secondary | ICD-10-CM

## 2015-11-11 NOTE — Progress Notes (Signed)
Subjective: She says she feels okay. She has some shortness of breath. She has no other new complaints. She is not having any swelling. She is concerned about her diet  Objective: Vital signs in last 24 hours: Temp:  [97.6 F (36.4 C)-98.1 F (36.7 C)] 97.6 F (36.4 C) (11/11 0623) Pulse Rate:  [52-82] 52 (11/11 0623) Resp:  [18-20] 20 (11/11 0623) BP: (110-113)/(68-75) 110/68 mmHg (11/11 0623) SpO2:  [94 %-97 %] 94 % (11/11 0730) Weight:  [58.9 kg (129 lb 13.6 oz)] 58.9 kg (129 lb 13.6 oz) (11/11 0500) Weight change: 1.747 kg (3 lb 13.6 oz) Last BM Date: 11/10/15  Intake/Output from previous day: 11/10 0701 - 11/11 0700 In: 1080 [P.O.:1080] Out: 1900 [Urine:1900]  PHYSICAL EXAM General appearance: alert, cooperative and mild distress Resp: rhonchi bilaterally Cardio: regular rate and rhythm, S1, S2 normal, no murmur, click, rub or gallop GI: soft, non-tender; bowel sounds normal; no masses,  no organomegaly Extremities: She has no edema now but says it gets worse as the day goes on  Lab Results:  Results for orders placed or performed during the hospital encounter of 11/09/15 (from the past 48 hour(s))  CBC with Differential/Platelet     Status: Abnormal   Collection Time: 11/09/15  7:55 PM  Result Value Ref Range   WBC 4.9 4.0 - 10.5 K/uL   RBC 3.67 (L) 3.87 - 5.11 MIL/uL   Hemoglobin 11.7 (L) 12.0 - 15.0 g/dL   HCT 34.9 (L) 36.0 - 46.0 %   MCV 95.1 78.0 - 100.0 fL   MCH 31.9 26.0 - 34.0 pg   MCHC 33.5 30.0 - 36.0 g/dL   RDW 13.4 11.5 - 15.5 %   Platelets 222 150 - 400 K/uL   Neutrophils Relative % 54 %   Neutro Abs 2.7 1.7 - 7.7 K/uL   Lymphocytes Relative 33 %   Lymphs Abs 1.6 0.7 - 4.0 K/uL   Monocytes Relative 10 %   Monocytes Absolute 0.5 0.1 - 1.0 K/uL   Eosinophils Relative 3 %   Eosinophils Absolute 0.1 0.0 - 0.7 K/uL   Basophils Relative 0 %   Basophils Absolute 0.0 0.0 - 0.1 K/uL  Basic metabolic panel     Status: Abnormal   Collection Time: 11/09/15   7:55 PM  Result Value Ref Range   Sodium 134 (L) 135 - 145 mmol/L   Potassium 3.9 3.5 - 5.1 mmol/L   Chloride 98 (L) 101 - 111 mmol/L   CO2 28 22 - 32 mmol/L   Glucose, Bld 87 65 - 99 mg/dL   BUN 21 (H) 6 - 20 mg/dL   Creatinine, Ser 1.05 (H) 0.44 - 1.00 mg/dL   Calcium 9.1 8.9 - 10.3 mg/dL   GFR calc non Af Amer 46 (L) >60 mL/min   GFR calc Af Amer 53 (L) >60 mL/min    Comment: (NOTE) The eGFR has been calculated using the CKD EPI equation. This calculation has not been validated in all clinical situations. eGFR's persistently <60 mL/min signify possible Chronic Kidney Disease.    Anion gap 8 5 - 15  Troponin I     Status: None   Collection Time: 11/09/15  7:55 PM  Result Value Ref Range   Troponin I <0.03 <0.031 ng/mL    Comment:        NO INDICATION OF MYOCARDIAL INJURY.   Brain natriuretic peptide     Status: None   Collection Time: 11/09/15  7:55 PM  Result Value Ref  Range   B Natriuretic Peptide 84.0 0.0 - 100.0 pg/mL  POC occult blood, ED     Status: None   Collection Time: 11/09/15  8:06 PM  Result Value Ref Range   Fecal Occult Bld NEGATIVE NEGATIVE  Basic metabolic panel     Status: Abnormal   Collection Time: 11/10/15  5:51 AM  Result Value Ref Range   Sodium 137 135 - 145 mmol/L   Potassium 3.6 3.5 - 5.1 mmol/L   Chloride 100 (L) 101 - 111 mmol/L   CO2 30 22 - 32 mmol/L   Glucose, Bld 71 65 - 99 mg/dL   BUN 22 (H) 6 - 20 mg/dL   Creatinine, Ser 1.12 (H) 0.44 - 1.00 mg/dL   Calcium 8.8 (L) 8.9 - 10.3 mg/dL   GFR calc non Af Amer 42 (L) >60 mL/min   GFR calc Af Amer 49 (L) >60 mL/min    Comment: (NOTE) The eGFR has been calculated using the CKD EPI equation. This calculation has not been validated in all clinical situations. eGFR's persistently <60 mL/min signify possible Chronic Kidney Disease.    Anion gap 7 5 - 15  CBC     Status: Abnormal   Collection Time: 11/10/15  5:51 AM  Result Value Ref Range   WBC 4.2 4.0 - 10.5 K/uL   RBC 3.43 (L) 3.87  - 5.11 MIL/uL   Hemoglobin 10.7 (L) 12.0 - 15.0 g/dL   HCT 32.6 (L) 36.0 - 46.0 %   MCV 95.0 78.0 - 100.0 fL   MCH 31.2 26.0 - 34.0 pg   MCHC 32.8 30.0 - 36.0 g/dL   RDW 13.4 11.5 - 15.5 %   Platelets 212 150 - 400 K/uL    ABGS No results for input(s): PHART, PO2ART, TCO2, HCO3 in the last 72 hours.  Invalid input(s): PCO2 CULTURES No results found for this or any previous visit (from the past 240 hour(s)). Studies/Results: Dg Chest Portable 1 View  11/09/2015  CLINICAL DATA:  Shortness of Breath.  Hypertension. EXAM: PORTABLE CHEST 1 VIEW COMPARISON:  September 06, 2015 FINDINGS: There is scarring in the left base. There is mild bibasilar interstitial edema. There is no airspace consolidation. Heart is mildly enlarged. The pulmonary vascularity is within normal limits. No adenopathy. Aorta is tortuous with atherosclerotic change, stable. Bones are osteoporotic. IMPRESSION: There is cardiomegaly with mild interstitial edema consistent with a degree of congestive heart failure. Stable scarring left base. No airspace consolidation. Prominent aorta with atherosclerotic calcification. This finding is most likely due to chronic hypertensive change. Electronically Signed   By: Lowella Grip III M.D.   On: 11/09/2015 20:34    Medications:  Prior to Admission:  Prescriptions prior to admission  Medication Sig Dispense Refill Last Dose  . acetaminophen (TYLENOL) 500 MG tablet Take 250-500 mg by mouth daily as needed for mild pain or moderate pain.    11/08/2015  . albuterol (PROVENTIL) (2.5 MG/3ML) 0.083% nebulizer solution Take 2.5 mg by nebulization 3 (three) times daily.    11/09/2015 at Unknown time  . aspirin EC 81 MG tablet Take 81 mg by mouth daily.   11/09/2015 at Unknown time  . budesonide-formoterol (SYMBICORT) 160-4.5 MCG/ACT inhaler Inhale 2 puffs into the lungs 2 (two) times daily.    11/09/2015 at Unknown time  . citalopram (CELEXA) 20 MG tablet Take 20 mg by mouth at bedtime.     11/08/2015 at Unknown time  . diazepam (VALIUM) 10 MG tablet Take 10 mg by  mouth 3 (three) times daily as needed for anxiety.   11/09/2015 at Unknown time  . gabapentin (NEURONTIN) 100 MG capsule Take 100 mg by mouth 3 (three) times daily.    11/08/2015  . levofloxacin (LEVAQUIN) 500 MG tablet Take 1 tablet (500 mg total) by mouth daily. 6 tablet 0 11/09/2015 at Unknown time  . metroNIDAZOLE (FLAGYL) 500 MG tablet Take 1 tablet (500 mg total) by mouth 3 (three) times daily. 21 tablet 0 11/09/2015 at Unknown time  . oxyCODONE-acetaminophen (PERCOCET) 7.5-325 MG tablet Take 1 tablet by mouth 4 (four) times daily as needed. pain  0 11/09/2015 at Unknown time  . tiotropium (SPIRIVA) 18 MCG inhalation capsule Place 18 mcg into inhaler and inhale every evening.    11/09/2015 at Unknown time  . torsemide (DEMADEX) 20 MG tablet Take 20 mg by mouth every morning.    11/09/2015 at Unknown time  . oxyCODONE-acetaminophen (PERCOCET/ROXICET) 5-325 MG per tablet Take 1 tablet by mouth every 6 (six) hours as needed. (Patient not taking: Reported on 11/02/2015) 20 tablet 0    Scheduled: . albuterol  2.5 mg Nebulization TID  . antiseptic oral rinse  7 mL Mouth Rinse BID  . aspirin EC  81 mg Oral Daily  . budesonide-formoterol  2 puff Inhalation BID  . citalopram  20 mg Oral QHS  . furosemide  40 mg Intravenous BID  . gabapentin  100 mg Oral TID  . heparin  5,000 Units Subcutaneous 3 times per day  . [START ON 11/12/2015] levofloxacin  500 mg Oral Q48H  . metroNIDAZOLE  500 mg Oral TID  . sodium chloride  3 mL Intravenous Q12H  . tiotropium  18 mcg Inhalation QPM   Continuous:  [id number], oxyCODONE-acetaminophen  Assesment: She was admitted with heart failure. She is improving. Echocardiogram apparently has not been done yet. I'm going to order a chest x-ray today to see if her chest has cleared. She has COPD which is severe. She has chronic hypoxic respiratory failure on home oxygen.  She has failure to  thrive  She has a very large abdominal aortic aneurysm and she is not a candidate for surgery.  She has limited understanding of her situation Principal Problem:   CHF (congestive heart failure) ([place]) Active Problems:   COPD (chronic obstructive pulmonary disease) (HCC)   Failure to thrive in adult    Plan: Echocardiogram. Chest x-ray today. Dietitian consult to see if we can help her with understanding her low-salt diet    LOS: 1 day   Melanie Lowe L 11/11/2015, 8:08 AM

## 2015-11-11 NOTE — Clinical Documentation Improvement (Signed)
Pulmonology  Can the diagnosis of CHF be further specified?    Acuity - Acute, Chronic, Acute on Chronic   Type - Systolic, Diastolic, Systolic and Diastolic  Other  Clinically Undetermined  Document any associated diagnoses/conditions Please update your documentation within the medical record to reflect your response to this query. Thank you   Supporting Information: (as per notes) "Acute congestive heart failure, unspecified congestive heart failure type (HCC) ", "Will admit her for very mild CHF. She likely just need a slight diuresis."  Please exercise your independent, professional judgment when responding. A specific answer is not anticipated or expected.  Thank You, Nevin Bloodgood, RN, BSN, CCDS,Clinical Documentation Specialist:  [phone]  [phone]=[facility]- Health Information Management

## 2015-11-11 NOTE — Plan of Care (Signed)
Problem: Food- and Nutrition-Related Knowledge Deficit (NB-1.1) Goal: Nutrition education Formal process to instruct or train a patient/client in a skill or to impart knowledge to help patients/clients voluntarily manage or modify food choices and eating behavior to maintain or improve health. Outcome: Completed/Met Date Met:  11/11/15 Nutrition Education Note  RD consulted for nutrition education regarding questions on low sodium diet. Pt states "Everything has salt in it" and requests counseling.   RD provided both a list of high sodium foods and the "Low Sodium Nutrition Therapy" handout from the Academy [employer]. Reviewed patient's dietary recall. Unfortunately, many of the foods patient is eating are high in sodium : Canned vegetables, canned soups, bolgna, bacon, potato chips, hotdogs, cheese, biscuits, frozen foods.   Pt states that she receives meals on wheels each day. She reports they do not know her CHF dx or low sodium diet requirement. Encouraged her to let them know so they can bring her lower sodium options. Almost all of the rest of her meals come from her friends/relatives; hopefully the handouts make their way to them. It slightly sounded that those preparing her meals are being overly restrictive. She was told she couldn't have diet soda or cake.    Provided examples on ways to decrease sodium intake in diet such as cutting up a baked chicken breast for sandwiches or making own soups from scratch. Discouraged intake of processed foods (mac and cheese) and use of salt shaker. For snacks, incouraged fresh fruits/vegetablesin place of her potato chips.  Many of pts favorite foods are high sodium. Sounded like she was eating multiple items off of the "high sodium foods" list daily. Counseled pt that she does not need to strictly avoid eating high salt foods ever again. She can eat what she wants daily as long as she stays under the 2000 mg/day rule. This comforted her a  little.   Unknown likelihood of compliance-patient does not really prepare her own meals.   Body mass index is 24.55 kg/(m^2). Pt meets criteria for Healthy ht/wt based on current BMI.  Current diet order is 2 gram Na, patient is consuming approximately 100% of meals at this time. Labs and medications reviewed. No further nutrition interventions warranted at this time. RD contact information provided. If additional nutrition issues arise, please re-consult RD.   Burtis Junes RD, LDN Nutrition Pager: [phone] 11/11/2015 1:25 PM

## 2015-11-11 NOTE — Care Management Important Message (Signed)
Important Message  Patient Details  Name: Melanie Lowe MRN: [id number] Date of Birth: 1924/03/30   Medicare Important Message Given:  Yes    Cheryl Flash, RN 11/11/2015, 11:34 AM

## 2015-11-11 NOTE — Care Management Note (Signed)
Case Management Note  Patient Details  Name: MICHALINA HUSCHER MRN: [id number] Date of Birth: 1924/08/25  Subjective/Objective:                    Action/Plan:   Expected Discharge Date:  11/11/15               Expected Discharge Plan:  Home w Home Health Services  In-House Referral:  NA  Discharge planning Services  CM Consult  Post Acute Care Choice:  NA Choice offered to:  NA  DME Arranged:    DME Agency:     HH Arranged:  RN HH Agency:  [facility]  Status of Service:  Completed, signed off  Medicare Important Message Given:    Date Medicare IM Given:    Medicare IM give by:    Date Additional Medicare IM Given:    Additional Medicare Important Message give by:     If discussed at Long Length of Stay Meetings, dates discussed:    Additional Comments: Anticipate discharge over the weekend with [facility] for RN. Alroy Bailiff of [facility] is aware and will collect the pts information from the chart. HH services to start within 48 hours of discharge. Weekend staff will need to call and fax orders at discharge.  Arlyss Queen Braden, RN 11/11/2015, 11:32 AM

## 2015-11-12 DIAGNOSIS — I5033 Acute on chronic diastolic (congestive) heart failure: Secondary | ICD-10-CM | POA: Diagnosis present

## 2015-11-12 LAB — GLUCOSE, CAPILLARY: GLUCOSE-CAPILLARY: 82 mg/dL (ref 65–99)

## 2015-11-12 MED ORDER — LEVOFLOXACIN 500 MG PO TABS: 500.0000 mg | ORAL_TABLET | Freq: Every day | ORAL | Status: DC

## 2015-11-12 MED ORDER — TORSEMIDE 20 MG PO TABS: 20.0000 mg | ORAL_TABLET | Freq: Two times a day (BID) | ORAL | Status: DC

## 2015-11-12 NOTE — Discharge Summary (Addendum)
Physician Discharge Summary  Patient ID: Melanie Lowe MRN: [id number] DOB/AGE: 1924/05/04 81 y.o. Primary Care Physician:Reef Achterberg L, MD Admit date: 11/09/2015 Discharge date: 11/12/2015    Discharge Diagnoses:   Principal Problem:   Acute on chronic diastolic heart failure (HCC) Active Problems:   SOB (shortness of breath)   COPD (chronic obstructive pulmonary disease) (HCC)   Failure to thrive in adult   CHF (congestive heart failure) (HCC)     Medication List    STOP taking these medications        metroNIDAZOLE 500 MG tablet  Commonly known as:  FLAGYL      TAKE these medications        acetaminophen 500 MG tablet  Commonly known as:  TYLENOL  Take 250-500 mg by mouth daily as needed for mild pain or moderate pain.     albuterol (2.5 MG/3ML) 0.083% nebulizer solution  Commonly known as:  PROVENTIL  Take 2.5 mg by nebulization 3 (three) times daily.     aspirin EC 81 MG tablet  Take 81 mg by mouth daily.     budesonide-formoterol 160-4.5 MCG/ACT inhaler  Commonly known as:  SYMBICORT  Inhale 2 puffs into the lungs 2 (two) times daily.     citalopram 20 MG tablet  Commonly known as:  CELEXA  Take 20 mg by mouth at bedtime.     diazepam 10 MG tablet  Commonly known as:  VALIUM  Take 10 mg by mouth 3 (three) times daily as needed for anxiety.     gabapentin 100 MG capsule  Commonly known as:  NEURONTIN  Take 100 mg by mouth 3 (three) times daily.     levofloxacin 500 MG tablet  Commonly known as:  LEVAQUIN  Take 1 tablet (500 mg total) by mouth daily.     oxyCODONE-acetaminophen 7.5-325 MG tablet  Commonly known as:  PERCOCET  Take 1 tablet by mouth 4 (four) times daily as needed. pain     tiotropium 18 MCG inhalation capsule  Commonly known as:  SPIRIVA  Place 18 mcg into inhaler and inhale every evening.     torsemide 20 MG tablet  Commonly known as:  DEMADEX  Take 1 tablet (20 mg total) by mouth 2 (two) times daily.        Discharged  Condition: Improved    Consults: None  Significant Diagnostic Studies: Ct Abdomen Pelvis Wo Contrast  10/29/2015  CLINICAL DATA:  Left lower quadrant abdominal pain radiating to left groin area. Palpable abnormality in the left groin starting about 2 days ago. EXAM: CT ABDOMEN AND PELVIS WITHOUT CONTRAST TECHNIQUE: Multidetector CT imaging of the abdomen and pelvis was performed following the standard protocol without IV contrast. COMPARISON:  CT abdomen dated 09/12/2015. FINDINGS: Patient is status post cholecystectomy. Liver, spleen, pancreas, adrenal glands, and kidneys are unremarkable for a noncontrast study. No renal or ureteral calculi identified. Bladder is unremarkable. Saccular aneurysm of the mid abdominal aorta is unchanged in size or configuration in the short-term interval, measuring 7.1 x 7.5 cm on the recent contrast-enhanced CT. No periaortic hemorrhage or edema. Prominent atherosclerotic calcifications again noted along the walls of the abdominal aorta and branch vessels. Fairly extensive diverticulosis throughout the descending and sigmoid colon. Subtle bowel wall thickening and pericolic inflammation/fluid stranding is present within the upper sigmoid colon consistent with a focal acute diverticulitis. No pericolic abscess collection. No free intraperitoneal air (no evidence of bowel perforation). No large or small bowel dilatation. No evidence of bowel wall  inflammation. Appendix is not convincingly seen but there are no inflammatory changes in the right lower quadrant to suggest acute appendicitis. No abscess collection. No free intraperitoneal air. No enlarged lymph nodes seen. Uterus and adnexal regions are unremarkable. Degenerative changes are seen throughout the slightly scoliotic thoracolumbar spine but no acute osseous abnormality. Scarring/fibrosis noted at each lung base. Are small bilateral inguinal hernias which contain fat only. No mass or significant lymphadenopathy within  either groin region. No fluid collection or inflammatory change within either groin region. IMPRESSION: 1. Focal acute diverticulitis within the upper sigmoid colon with associated mild bowel wall thickening and mild pericolic inflammation/fluid stranding (best seen on series 2, images 49 through 54 and on coronal series 3, images 41 through 44). No pericolic abscess. No evidence of bowel perforation. No associated bowel obstruction. 2. Large abdominal aortic aneurysm, better defined on recent contrast enhanced CT of 09/12/2015, not significantly changed in size compared to that previous exam where it measured 7.1 x 7.5 cm. No periaortic hemorrhage or edema. 3. Small bilateral inguinal hernias which contain fat only. No bowel involvement. No mass or lymphadenopathy within either groin region. No fluid collection or inflammatory change within either groin region. Suspect patient's left groin pain is referred from the area of acute diverticulitis in the left lower quadrant. 4. Degenerative changes throughout the thoracolumbar spine, at least moderate in degree. Electronically Signed   By: Bary Richard M.D.   On: 10/29/2015 21:46   Ct Abdomen Pelvis W Contrast  11/02/2015  CLINICAL DATA:  Recent diagnosis of sigmoid diverticulitis. Increasing left abdominal pain on antibiotics. EXAM: CT ABDOMEN AND PELVIS WITH CONTRAST TECHNIQUE: Multidetector CT imaging of the abdomen and pelvis was performed using the standard protocol following bolus administration of intravenous contrast. CONTRAST:  85mL OMNIPAQUE IOHEXOL 300 MG/ML  SOLN COMPARISON:  10/29/2015, 09/12/2015. FINDINGS: There is reduced inflammation around the distal descending and proximal sigmoid colon compared to 10/29/2015. No other inflammatory changes are evident in the abdomen or pelvis. There is extensive colonic diverticulosis. There is no bowel obstruction. Stomach and small bowel appear unremarkable. There is no interval change in the aneurysm of the  mid abdominal aorta, measuring 7.5 cm in maximum AP dimension. There is no evidence of aneurysm rupture or retroperitoneal hemorrhage. There are unremarkable appearances of the liver, spleen, pancreas. There is cholecystectomy. There is mild benign appearing nodularity of the left adrenal, unchanged. Kidneys are remarkable only for mass effect on the left kidney 2 2 the abdominal aortic aneurysm, unchanged. No adnexal abnormalities are evident.  No adenopathy.  No ascites. There is mild ground-glass opacity in the posterior right lower lobe which is new. This could represent early infectious infiltrate atelectasis. IMPRESSION: 1. Substantial reduction in the pericolonic inflammatory changes, consistent with resolving diverticulitis. No abscess. No extraluminal air. No bowel obstruction. 2. Unchanged 7.5 cm abdominal aortic aneurysm. No evidence of aneurysm rupture or retroperitoneal hemorrhage. 3. Mild right lower lobe ground-glass opacity, incompletely imaged, possibly early infectious infiltrate. Electronically Signed   By: Ellery Plunk M.D.   On: 11/02/2015 21:36   Dg Chest Port 1 View  11/11/2015  CLINICAL DATA:  CHF. EXAM: PORTABLE CHEST 1 VIEW COMPARISON:  11/09/2015 FINDINGS: Cardiac silhouette is mildly enlarged. Aorta is mildly uncoiled and tortuous. No mediastinal or hilar masses or convincing adenopathy. Mild basilar atelectasis. No convincing pulmonary edema or pneumonia. No pleural effusion or pneumothorax. Skeletal structures are unremarkable. IMPRESSION: 1. Improved appearance of chest since the prior study. No evidence of residual congestive  heart failure. No new abnormalities. Electronically Signed   By: Amie Portland M.D.   On: 11/11/2015 14:11   Dg Chest Portable 1 View  11/09/2015  CLINICAL DATA:  Shortness of Breath.  Hypertension. EXAM: PORTABLE CHEST 1 VIEW COMPARISON:  September 06, 2015 FINDINGS: There is scarring in the left base. There is mild bibasilar interstitial edema. There  is no airspace consolidation. Heart is mildly enlarged. The pulmonary vascularity is within normal limits. No adenopathy. Aorta is tortuous with atherosclerotic change, stable. Bones are osteoporotic. IMPRESSION: There is cardiomegaly with mild interstitial edema consistent with a degree of congestive heart failure. Stable scarring left base. No airspace consolidation. Prominent aorta with atherosclerotic calcification. This finding is most likely due to chronic hypertensive change. Electronically Signed   By: Bretta Bang III M.D.   On: 11/09/2015 20:34    Lab Results: Basic Metabolic Panel:  Recent Labs  16/10/96 1955 11/10/15 0551  NA 134* 137  K 3.9 3.6  CL 98* 100*  CO2 28 30  GLUCOSE 87 71  BUN 21* 22*  CREATININE 1.05* 1.12*  CALCIUM 9.1 8.8*   Liver Function Tests: No results for input(s): AST, ALT, ALKPHOS, BILITOT, PROT, ALBUMIN in the last 72 hours.   CBC:  Recent Labs  11/09/15 1955 11/10/15 0551  WBC 4.9 4.2  NEUTROABS 2.7  --   HGB 11.7* 10.7*  HCT 34.9* 32.6*  MCV 95.1 95.0  PLT 222 212    No results found for this or any previous visit (from the past 240 hour(s)).   Hospital Course: This is an 81 year old who has multiple medical problems. She came to the emergency department with shortness of breath. She has been in and out of the hospital recently with pneumonia and diverticulitis. She had what looks like heart failure on chest x-ray. Echocardiogram showed good systolic function so this is felt to be diastolic heart failure acute on chronic. She was treated with intravenous diuresis and improved. Chest x-ray cleared. She had consult with dietitian regarding low salt diet but she does not actually prepare her meals so handouts are being sent home for her caretakers. She is at maximum hospital benefit  Discharge Exam: Blood pressure 122/70, pulse 61, temperature 97.5 F (36.4 C), temperature source Oral, resp. rate 20, height  (1.549 m), weight  59.467 kg (131 lb 1.6 oz), SpO2 95 %. She is awake and alert. No edema. Chest is clear.  Disposition: Home. Although arrangements had previously been made for home health services she refused those on the day of discharge      Discharge Instructions    Discharge patient    Complete by:  As directed            Follow-up Information    Follow up with [facility].   Contact information:   [address] [place] [address] [phone]       Signed: Fredirick Maudlin   11/12/2015, 10:19 AM

## 2015-11-12 NOTE — Progress Notes (Signed)
Subjective: She feels better. Her chest x-ray was clear yesterday. Dietitian help is noted and appreciated  Objective: Vital signs in last 24 hours: Temp:  [97.5 F (36.4 C)-98.1 F (36.7 C)] 97.5 F (36.4 C) (11/12 0711) Pulse Rate:  [61-72] 61 (11/12 0711) Resp:  [20] 20 (11/12 0711) BP: (88-122)/(57-70) 122/70 mmHg (11/12 0711) SpO2:  [93 %-97 %] 95 % (11/12 0811) Weight:  [59.467 kg (131 lb 1.6 oz)] 59.467 kg (131 lb 1.6 oz) (11/12 0711) Weight change:  Last BM Date: 11/11/15  Intake/Output from previous day: 11/11 0701 - 11/12 0700 In: 1203 [P.O.:1200; I.V.:3] Out: 2100 [Urine:2100]  PHYSICAL EXAM General appearance: alert, cooperative and no distress Resp: rhonchi bilaterally Cardio: regular rate and rhythm, S1, S2 normal, no murmur, click, rub or gallop GI: soft, non-tender; bowel sounds normal; no masses,  no organomegaly Extremities: extremities normal, atraumatic, no cyanosis or edema  Lab Results:  Results for orders placed or performed during the hospital encounter of 11/09/15 (from the past 48 hour(s))  Glucose, capillary     Status: None   Collection Time: 11/12/15  7:35 AM  Result Value Ref Range   Glucose-Capillary 82 65 - 99 mg/dL   Comment 1 Notify RN     ABGS No results for input(s): PHART, PO2ART, TCO2, HCO3 in the last 72 hours.  Invalid input(s): PCO2 CULTURES No results found for this or any previous visit (from the past 240 hour(s)). Studies/Results: Dg Chest Port 1 View  11/11/2015  CLINICAL DATA:  CHF. EXAM: PORTABLE CHEST 1 VIEW COMPARISON:  11/09/2015 FINDINGS: Cardiac silhouette is mildly enlarged. Aorta is mildly uncoiled and tortuous. No mediastinal or hilar masses or convincing adenopathy. Mild basilar atelectasis. No convincing pulmonary edema or pneumonia. No pleural effusion or pneumothorax. Skeletal structures are unremarkable. IMPRESSION: 1. Improved appearance of chest since the prior study. No evidence of residual congestive heart  failure. No new abnormalities. Electronically Signed   By: Amie Portland M.D.   On: 11/11/2015 14:11    Medications:  Prior to Admission:  Prescriptions prior to admission  Medication Sig Dispense Refill Last Dose  . acetaminophen (TYLENOL) 500 MG tablet Take 250-500 mg by mouth daily as needed for mild pain or moderate pain.    11/08/2015  . albuterol (PROVENTIL) (2.5 MG/3ML) 0.083% nebulizer solution Take 2.5 mg by nebulization 3 (three) times daily.    11/09/2015 at Unknown time  . aspirin EC 81 MG tablet Take 81 mg by mouth daily.   11/09/2015 at Unknown time  . budesonide-formoterol (SYMBICORT) 160-4.5 MCG/ACT inhaler Inhale 2 puffs into the lungs 2 (two) times daily.    11/09/2015 at Unknown time  . citalopram (CELEXA) 20 MG tablet Take 20 mg by mouth at bedtime.    11/08/2015 at Unknown time  . diazepam (VALIUM) 10 MG tablet Take 10 mg by mouth 3 (three) times daily as needed for anxiety.   11/09/2015 at Unknown time  . gabapentin (NEURONTIN) 100 MG capsule Take 100 mg by mouth 3 (three) times daily.    11/08/2015  . metroNIDAZOLE (FLAGYL) 500 MG tablet Take 1 tablet (500 mg total) by mouth 3 (three) times daily. 21 tablet 0 11/09/2015 at Unknown time  . oxyCODONE-acetaminophen (PERCOCET) 7.5-325 MG tablet Take 1 tablet by mouth 4 (four) times daily as needed. pain  0 11/09/2015 at Unknown time  . tiotropium (SPIRIVA) 18 MCG inhalation capsule Place 18 mcg into inhaler and inhale every evening.    11/09/2015 at Unknown time  . [DISCONTINUED] levofloxacin (  LEVAQUIN) 500 MG tablet Take 1 tablet (500 mg total) by mouth daily. 6 tablet 0 11/09/2015 at Unknown time  . [DISCONTINUED] torsemide (DEMADEX) 20 MG tablet Take 20 mg by mouth every morning.    11/09/2015 at Unknown time  . oxyCODONE-acetaminophen (PERCOCET/ROXICET) 5-325 MG per tablet Take 1 tablet by mouth every 6 (six) hours as needed. (Patient not taking: Reported on 11/02/2015) 20 tablet 0    Scheduled: . albuterol  2.5 mg Nebulization TID   . antiseptic oral rinse  7 mL Mouth Rinse BID  . aspirin EC  81 mg Oral Daily  . budesonide-formoterol  2 puff Inhalation BID  . citalopram  20 mg Oral QHS  . furosemide  40 mg Intravenous BID  . gabapentin  100 mg Oral TID  . heparin  5,000 Units Subcutaneous 3 times per day  . levofloxacin  500 mg Oral Q48H  . metroNIDAZOLE  500 mg Oral TID  . sodium chloride  3 mL Intravenous Q12H  . tiotropium  18 mcg Inhalation QPM   Continuous:  [id number], oxyCODONE-acetaminophen  Assesment: She was admitted with acute on chronic diastolic heart failure. She has COPD which compounds the problem. She is much improved. She is ready for discharge. Principal Problem:   Acute on chronic diastolic heart failure (HCC) Active Problems:   SOB (shortness of breath)   COPD (chronic obstructive pulmonary disease) (HCC)   Failure to thrive in adult   CHF (congestive heart failure) (HCC)    Plan: Discharge home today she does not want home health services    LOS: 2 days   Rodrickus Min L 11/12/2015, 9:58 AM

## 2015-11-12 NOTE — Progress Notes (Signed)
Patient alert and oriented, VSS, pt. Tolerating diet well. No complaints of pain or nausea. Pt. Had IV removed tip intact. Pt. Had prescriptions given. Pt. Voiced understanding of discharge instructions with no further questions. Pt. Discharged via wheelchair with auxilliary.

## 2015-11-12 NOTE — Progress Notes (Signed)
CM communicated with patient about her discharge and receiving home health services.  Patient to have skilled RN visit once discharged.  Pt. Question about medication changes and diet.  Patient to consult with doctor regarding concerns of medications.  Nurse to provide education regarding diet for her diagnosis.  CM talk to patient regarding sodium free type of foods.  Patient already has her own oxygen tank in room for transporting home.  Paperwork faxed over to [facility] for processing for [facility] services.

## 2015-11-29 ENCOUNTER — Emergency Department (HOSPITAL_COMMUNITY)
Admission: EM | Admit: 2015-11-29 | Discharge: 2015-11-29 | Disposition: A | Discharge status: Home / Self Care | Payer: Commercial Managed Care - HMO | Attending: Emergency Medicine | Admitting: Emergency Medicine

## 2015-11-29 ENCOUNTER — Encounter (HOSPITAL_COMMUNITY): Payer: Self-pay | Admitting: Emergency Medicine

## 2015-11-29 ENCOUNTER — Emergency Department (HOSPITAL_COMMUNITY): Payer: Commercial Managed Care - HMO

## 2015-11-29 DIAGNOSIS — I252 Old myocardial infarction: Secondary | ICD-10-CM | POA: Insufficient documentation

## 2015-11-29 DIAGNOSIS — R1084 Generalized abdominal pain: Secondary | ICD-10-CM | POA: Diagnosis not present

## 2015-11-29 DIAGNOSIS — Z79899 Other long term (current) drug therapy: Secondary | ICD-10-CM | POA: Diagnosis not present

## 2015-11-29 DIAGNOSIS — Z87891 Personal history of nicotine dependence: Secondary | ICD-10-CM | POA: Diagnosis not present

## 2015-11-29 DIAGNOSIS — J449 Chronic obstructive pulmonary disease, unspecified: Secondary | ICD-10-CM | POA: Diagnosis not present

## 2015-11-29 DIAGNOSIS — M1990 Unspecified osteoarthritis, unspecified site: Secondary | ICD-10-CM | POA: Diagnosis not present

## 2015-11-29 DIAGNOSIS — Z7982 Long term (current) use of aspirin: Secondary | ICD-10-CM | POA: Diagnosis not present

## 2015-11-29 DIAGNOSIS — Z8673 Personal history of transient ischemic attack (TIA), and cerebral infarction without residual deficits: Secondary | ICD-10-CM | POA: Diagnosis not present

## 2015-11-29 DIAGNOSIS — Z8701 Personal history of pneumonia (recurrent): Secondary | ICD-10-CM | POA: Insufficient documentation

## 2015-11-29 DIAGNOSIS — K5732 Diverticulitis of large intestine without perforation or abscess without bleeding: Secondary | ICD-10-CM | POA: Diagnosis not present

## 2015-11-29 DIAGNOSIS — F329 Major depressive disorder, single episode, unspecified: Secondary | ICD-10-CM | POA: Insufficient documentation

## 2015-11-29 DIAGNOSIS — R1013 Epigastric pain: Secondary | ICD-10-CM | POA: Diagnosis present

## 2015-11-29 DIAGNOSIS — G629 Polyneuropathy, unspecified: Secondary | ICD-10-CM | POA: Insufficient documentation

## 2015-11-29 DIAGNOSIS — Z9981 Dependence on supplemental oxygen: Secondary | ICD-10-CM | POA: Insufficient documentation

## 2015-11-29 DIAGNOSIS — I509 Heart failure, unspecified: Secondary | ICD-10-CM | POA: Diagnosis not present

## 2015-11-29 LAB — CBC WITH DIFFERENTIAL/PLATELET
BASOS PCT: 0 %
Basophils Absolute: 0 10*3/uL (ref 0.0–0.1)
EOS ABS: 0.1 10*3/uL (ref 0.0–0.7)
Eosinophils Relative: 3 %
HCT: 38.2 % (ref 36.0–46.0)
HEMOGLOBIN: 12.9 g/dL (ref 12.0–15.0)
Lymphocytes Relative: 36 %
Lymphs Abs: 1.7 10*3/uL (ref 0.7–4.0)
MCH: 32.2 pg (ref 26.0–34.0)
MCHC: 33.8 g/dL (ref 30.0–36.0)
MCV: 95.3 fL (ref 78.0–100.0)
MONO ABS: 0.6 10*3/uL (ref 0.1–1.0)
MONOS PCT: 12 %
NEUTROS PCT: 49 %
Neutro Abs: 2.3 10*3/uL (ref 1.7–7.7)
PLATELETS: 217 10*3/uL (ref 150–400)
RBC: 4.01 MIL/uL (ref 3.87–5.11)
RDW: 12.8 % (ref 11.5–15.5)
WBC: 4.7 10*3/uL (ref 4.0–10.5)

## 2015-11-29 LAB — AMYLASE: Amylase: 52 U/L (ref 28–100)

## 2015-11-29 LAB — COMPREHENSIVE METABOLIC PANEL
ALBUMIN: 4 g/dL (ref 3.5–5.0)
ALT: 12 U/L — ABNORMAL LOW (ref 14–54)
ANION GAP: 10 (ref 5–15)
AST: 18 U/L (ref 15–41)
Alkaline Phosphatase: 60 U/L (ref 38–126)
BUN: 28 mg/dL — ABNORMAL HIGH (ref 6–20)
CALCIUM: 9.5 mg/dL (ref 8.9–10.3)
CHLORIDE: 97 mmol/L — AB (ref 101–111)
CO2: 31 mmol/L (ref 22–32)
Creatinine, Ser: 1.14 mg/dL — ABNORMAL HIGH (ref 0.44–1.00)
GFR calc Af Amer: 48 mL/min — ABNORMAL LOW (ref >60)
GFR calc non Af Amer: 41 mL/min — ABNORMAL LOW (ref >60)
GLUCOSE: 85 mg/dL (ref 65–99)
POTASSIUM: 3.8 mmol/L (ref 3.5–5.1)
SODIUM: 138 mmol/L (ref 135–145)
TOTAL PROTEIN: 7.3 g/dL (ref 6.5–8.1)
Total Bilirubin: 0.6 mg/dL (ref 0.3–1.2)

## 2015-11-29 LAB — LIPASE, BLOOD: LIPASE: 28 U/L (ref 11–51)

## 2015-11-29 MED ORDER — IOHEXOL 300 MG/ML  SOLN
80.0000 mL | Freq: Once | INTRAMUSCULAR | Status: AC | PRN
  Administered 2015-11-29: 80 mL via INTRAVENOUS

## 2015-11-29 MED ORDER — ONDANSETRON HCL 4 MG/2ML IJ SOLN: 4.0000 mg | Freq: Once | INTRAMUSCULAR | Status: DC

## 2015-11-29 MED ORDER — DIATRIZOATE MEGLUMINE & SODIUM 66-10 % PO SOLN
ORAL | Status: AC
  Filled 2015-11-29: qty 30

## 2015-11-29 MED ORDER — SODIUM CHLORIDE 0.9 % IV SOLN: INTRAVENOUS | Status: DC

## 2015-11-29 NOTE — ED Notes (Signed)
Pt alert & oriented x4, stable gait. Patient given discharge instructions, paperwork & prescription(s). Patient  instructed to stop at the registration desk to finish any additional paperwork. Patient verbalized understanding. Pt left department w/ no further questions. 

## 2015-11-29 NOTE — ED Provider Notes (Signed)
CSN: [id number]     Arrival date & time 11/29/15  1413 History   First MD Initiated Contact with Patient 11/29/15 1513     Chief Complaint  Patient presents with  . Abdominal Pain     (Consider location/radiation/quality/duration/timing/severity/associated sxs/prior Treatment) Patient is a 81 y.o. female presenting with abdominal pain. The history is provided by the patient.  Abdominal Pain Associated symptoms: no chest pain, no diarrhea, no dysuria, no fever, no nausea, no shortness of breath and no vomiting    Patient with complaint of lower quadrant and epigastric abdominal pain that started this morning. Patient felt fine yesterday no nausea vomiting or diarrhea no fevers. Patient recently finished a course of antibiotics for diverticulitis completed that 2 days ago. Patient is concerned that has reoccurred. Patient states the discomfort is 6 out of 10. Does not radiate to the back. Past Medical History  Diagnosis Date  . COPD (chronic obstructive pulmonary disease) (HCC)   . Hypertension   . Anxiety   . Depression   . Arthritis   . AAA (abdominal aortic aneurysm) (HCC)   . Neuropathy (HCC)   . Leg swelling   . Oxygen dependent   . Myocardial infarction (HCC)   . Pneumonia   . Stroke (HCC)   . CHF (congestive heart failure) (HCC) 11/09/2015   Past Surgical History  Procedure Laterality Date  . Cholecystectomy    . Appendectomy     Family History  Problem Relation Age of Onset  . Diabetes Brother   . Cancer Brother     Throat cancer, Heart attack  . Heart disease Brother   . Heart attack Brother   . Cancer Father   . Cancer Sister    Social History  Substance Use Topics  . Smoking status: Former Smoker -- 50 years    Types: Cigarettes    Quit date: 12/31/2010  . Smokeless tobacco: Former Neurosurgeon    Quit date: 12/31/2010  . Alcohol Use: No   OB History    No data available     Review of Systems  Constitutional: Negative for fever.  HENT: Negative for  congestion.   Eyes: Negative for redness.  Respiratory: Negative for shortness of breath.   Cardiovascular: Negative for chest pain and leg swelling.  Gastrointestinal: Positive for abdominal pain. Negative for nausea, vomiting and diarrhea.  Genitourinary: Negative for dysuria.  Musculoskeletal: Negative for back pain.  Skin: Negative for rash.  Neurological: Negative for headaches.  Hematological: Does not bruise/bleed easily.  Psychiatric/Behavioral: Negative for confusion.      Allergies  Ciprofloxacin hcl; Nsaids; Other; and Tomato  Home Medications   Prior to Admission medications   Medication Sig Start Date End Date Taking? Authorizing Provider  acetaminophen (TYLENOL) 500 MG tablet Take 250-500 mg by mouth daily as needed for mild pain or moderate pain.     Historical Provider, MD  albuterol (PROVENTIL) (2.5 MG/3ML) 0.083% nebulizer solution Take 2.5 mg by nebulization 3 (three) times daily.     Historical Provider, MD  aspirin EC 81 MG tablet Take 81 mg by mouth daily.    Historical Provider, MD  budesonide-formoterol (SYMBICORT) 160-4.5 MCG/ACT inhaler Inhale 2 puffs into the lungs 2 (two) times daily.     Historical Provider, MD  citalopram (CELEXA) 20 MG tablet Take 20 mg by mouth at bedtime.     Historical Provider, MD  diazepam (VALIUM) 10 MG tablet Take 10 mg by mouth 3 (three) times daily as needed for anxiety.  Historical Provider, MD  gabapentin (NEURONTIN) 100 MG capsule Take 100 mg by mouth 3 (three) times daily.  09/06/15   Historical Provider, MD  levofloxacin (LEVAQUIN) 500 MG tablet Take 1 tablet (500 mg total) by mouth daily. 11/12/15   Kari Baars, MD  oxyCODONE-acetaminophen (PERCOCET) 7.5-325 MG tablet Take 1 tablet by mouth 4 (four) times daily as needed. pain 10/13/15   Historical Provider, MD  tiotropium (SPIRIVA) 18 MCG inhalation capsule Place 18 mcg into inhaler and inhale every evening.     Historical Provider, MD  torsemide (DEMADEX) 20 MG tablet  Take 1 tablet (20 mg total) by mouth 2 (two) times daily. 11/12/15   Kari Baars, MD   BP 131/83 mmHg  Pulse 52  Temp(Src) 98.1 F (36.7 C) (Oral)  Resp 13  Ht  (1.549 m)  Wt 54.885 kg  BMI 22.87 kg/m2  SpO2 98% Physical Exam  Constitutional: She is oriented to person, place, and time. She appears well-developed and well-nourished. No distress.  HENT:  Head: Normocephalic and atraumatic.  Mouth/Throat: Oropharynx is clear and moist.  Eyes: Conjunctivae and EOM are normal. Pupils are equal, round, and reactive to light.  Neck: Normal range of motion. Neck supple.  Cardiovascular: Normal rate, regular rhythm and normal heart sounds.   No murmur heard. Abdominal: Soft. Bowel sounds are normal. There is tenderness.  Mild diffuse abdominal tenderness without guarding.  Musculoskeletal: Normal range of motion. She exhibits no edema.  Neurological: She is alert and oriented to person, place, and time. No cranial nerve deficit. She exhibits normal muscle tone. Coordination normal.  Skin: Skin is warm. No rash noted.  Nursing note and vitals reviewed.   ED Course  Procedures (including critical care time) Labs Review Labs Reviewed  COMPREHENSIVE METABOLIC PANEL - Abnormal; Notable for the following:    Chloride 97 (*)    BUN 28 (*)    Creatinine, Ser 1.14 (*)    ALT 12 (*)    GFR calc non Af Amer 41 (*)    GFR calc Af Amer 48 (*)    All other components within normal limits  AMYLASE  CBC WITH DIFFERENTIAL/PLATELET  LIPASE, BLOOD    Imaging Review Ct Abdomen Pelvis W Contrast  11/29/2015  CLINICAL DATA:  81 year old female with history of generalized abdominal pain since this morning. Distended abdomen. Recent hospitalization for diverticulitis. EXAM: CT ABDOMEN AND PELVIS WITH CONTRAST TECHNIQUE: Multidetector CT imaging of the abdomen and pelvis was performed using the standard protocol following bolus administration of intravenous contrast. CONTRAST:  80mL OMNIPAQUE  IOHEXOL 300 MG/ML  SOLN COMPARISON:  Multiple priors, most recently 11/02/2015. FINDINGS: Lower chest: Extensive bronchial wall thickening, septal thickening and architectural distortion in the lower lobes of the lungs bilaterally, similar to the prior study, most compatible with chronic post infectious or inflammatory scarring. Mild cardiomegaly. Atherosclerotic calcifications in the right coronary artery. Although the aortic root is incompletely visualized, there appears to be aneurysmal dilatation of the non coronary sinus of Valsalva, which appear similar to prior studies. Hepatobiliary: No cystic or solid hepatic lesions. Tiny calcified granuloma in segment 3 of the liver. No intra or extrahepatic biliary ductal dilatation. Gallbladder is not visualized, presumably surgically absent (no surgical clips are noted in the gallbladder fossa). Pancreas: No pancreatic mass. No pancreatic ductal dilatation. No pancreatic or peripancreatic fluid or inflammatory changes. Spleen: Unremarkable. Adrenals/Urinary Tract: Mild diffuse cortical thinning in the kidneys bilaterally. No hydroureteronephrosis. Right adrenal gland is normal in appearance. 2.0 x 1.5 cm  left adrenal nodule is unchanged in size, and previously measured 3 HU (on CT scan 10/29/2015), compatible with an adenoma. Urinary bladder is moderately distended, but otherwise unremarkable in appearance. Stomach/Bowel: The appearance of the stomach is normal. No pathologic dilatation of small bowel or colon. Multiple colonic diverticulae are noted, most evident in the descending colon and sigmoid colon, without surrounding inflammatory changes to suggest an acute diverticulitis at this time. Specifically, the inflammatory changes noted adjacent to the distal descending colon proximal sigmoid colon on recent prior examinations have resolved. The appendix is not confidently identified, likely surgically absent. Regardless, no inflammatory changes are noted adjacent to  the cecum to suggest presence of an acute appendicitis at this time. Vascular/Lymphatic: Extensive atherosclerosis throughout the abdominal and pelvic vasculature again noted. There is a saccular aneurysm extending off the left side of the abdominal aorta, which begins immediately beneath the level of the left renal artery origin, and adjacent to the right renal artery origin, and extends over a length of approximately 8.4 cm craniocaudally, measuring up to 7.6 x 7.4 cm (image 26 of series 2). Again noted is extensive eccentric nonenhancing material in the aneurysmally dilated segment of the abdominal aorta, with some associated calcifications, compatible with a combination of a large amount of atheromatous plaque and chronic mural thrombus. No surrounding high attenuation to suggest active extravasation from this aneurysm at this time. The more distal infrarenal abdominal aorta is ectatic and mildly aneurysmal measuring up to 3.0 x 3.0 cm immediately above the level the bifurcation. Mild aneurysmal dilatation of the right common iliac artery (17 mm in diameter) is also noted. No lymphadenopathy noted in the abdomen or pelvis. Reproductive: Uterus and ovaries are atrophic. Other: No significant volume of ascites.  No pneumoperitoneum. Musculoskeletal: There are no aggressive appearing lytic or blastic lesions noted in the visualized portions of the skeleton. IMPRESSION: 1. No acute findings in the abdomen or pelvis. 2. Previously noted inflammatory changes adjacent to the distal descending colon and proximal sigmoid colon have resolved. Extensive diverticulosis of the colon is again noted, without definite findings of acute diverticulitis at this time. 3. Extensive atherosclerosis, including a large saccular aneurysm of the upper abdominal aorta which measure is up to 7.6 x 7.4 cm (similar to the prior study), as well as mild aneurysmal dilatation of the distal infrarenal abdominal aorta and the right common iliac  artery, as discussed above. Vascular surgery consultation recommended (if not already recently conducted) due to increased risk of rupture for AAA >5.5 cm. This recommendation follows ACR consensus guidelines: White Paper of the ACR Incidental Findings Committee II on Vascular Findings. J Am Coll Radiol 2013; 10:789-794. 4. Additional incidental findings, as above, similar to prior examinations. Electronically Signed   By: Trudie Reed M.D.   On: 11/29/2015 18:19   I have personally reviewed and evaluated these images and lab results as part of my medical decision-making.   EKG Interpretation None      MDM   Final diagnoses:  Generalized abdominal pain    Patient with complaint of epigastric and lower quadrant abdominal pain she was worried about the recurrence of her diverticulitis. CT scan shows resolution of that does have extensive diverticulosis. Also has known abdominal aortic aneurysm which is unchanged in the past. We'll make sure family knows that to her primary care doctor follows up on this. But I suspect Dr. Juanetta Gosling knows about it already. Patient's labs without any significant abnormalities. Patient should be stable for discharge home.  [name], MD 11/29/15 [phone]

## 2015-11-29 NOTE — ED Notes (Signed)
Pt states that she is not nauseated and does not need any Zofran.

## 2015-11-29 NOTE — Discharge Instructions (Signed)
No evidence of any acute abdominal process based on today's CAT scan to explain your abdominal pain. You do have an abdominal aortic aneurysm that will require follow-up with your regular doctor. Return for any new or worse symptoms.

## 2015-11-29 NOTE — ED Notes (Signed)
Pt reports generalized abdominal pain that began this morning. Pt states that her abdomen "feels distended." Denies n/v/d. Pt recently hospitalized for diverticulitis. Pt wears 2L nasal cannula at home.

## 2015-12-01 DIAGNOSIS — F419 Anxiety disorder, unspecified: Secondary | ICD-10-CM | POA: Diagnosis not present

## 2015-12-01 DIAGNOSIS — R0902 Hypoxemia: Secondary | ICD-10-CM | POA: Diagnosis not present

## 2015-12-01 DIAGNOSIS — J449 Chronic obstructive pulmonary disease, unspecified: Secondary | ICD-10-CM | POA: Diagnosis not present

## 2015-12-05 ENCOUNTER — Other Ambulatory Visit: Payer: Self-pay | Admitting: Pharmacist

## 2015-12-05 NOTE — Patient Outreach (Signed)
[facility] Customer service manager [facility]) Care Management  [facility]   12/05/2015  Melanie Lowe [birth year] [id number]  Subjective: Melanie Lowe is a 81 y.o. female who was referred to [facility] for medication assistance, particularly for affording her inhalers.   Patient has albuterol nebs, Symbicort, and Spiriva.   I called the patient but I was unable to reach her.   Objective:   Current Medications: Current Outpatient Prescriptions  Medication Sig Dispense Refill  . acetaminophen (TYLENOL) 500 MG tablet Take 250-500 mg by mouth daily as needed for mild pain or moderate pain.     Marland Kitchen albuterol (PROVENTIL) (2.5 MG/3ML) 0.083% nebulizer solution Take 2.5 mg by nebulization 3 (three) times daily.     Marland Kitchen aspirin EC 81 MG tablet Take 81 mg by mouth daily.    . budesonide-formoterol (SYMBICORT) 160-4.5 MCG/ACT inhaler Inhale 2 puffs into the lungs 2 (two) times daily.     . citalopram (CELEXA) 20 MG tablet Take 20 mg by mouth at bedtime.     . diazepam (VALIUM) 10 MG tablet Take 10 mg by mouth 3 (three) times daily as needed for anxiety.    . gabapentin (NEURONTIN) 100 MG capsule Take 100 mg by mouth 3 (three) times daily.     Marland Kitchen levofloxacin (LEVAQUIN) 500 MG tablet Take 1 tablet (500 mg total) by mouth daily. 5 tablet 0  . oxyCODONE-acetaminophen (PERCOCET) 7.5-325 MG tablet Take 1 tablet by mouth 4 (four) times daily as needed. pain  0  . tiotropium (SPIRIVA) 18 MCG inhalation capsule Place 18 mcg into inhaler and inhale every evening.     . torsemide (DEMADEX) 20 MG tablet Take 1 tablet (20 mg total) by mouth 2 (two) times daily. 60 tablet 12   No current facility-administered medications for this visit.    Functional Status: In your present state of health, do you have any difficulty performing the following activities: 11/09/2015 11/02/2015  Hearing? [name]  Vision? N N  Difficulty concentrating or making decisions? N N  Walking or climbing stairs? Y Y  Dressing or bathing? N N   Doing errands, shopping? [name]    Fall/Depression Screening: No flowsheet data found.  Assessment: 1. Medication assistance: unable to assess  Plan: 1. Medication assistance: I called the patient to discuss medication assistance and I had to leave a HIPAA compliant message for patient to return my phone call.  I will reach out on 12/09/15 if the patient does not return my call today.  Juanita Craver, PharmD, BCPS Clinical Pharmacist [facility] [phone]

## 2015-12-08 ENCOUNTER — Other Ambulatory Visit: Payer: Self-pay | Admitting: *Deleted

## 2015-12-08 DIAGNOSIS — I5030 Unspecified diastolic (congestive) heart failure: Secondary | ICD-10-CM

## 2015-12-08 DIAGNOSIS — J449 Chronic obstructive pulmonary disease, unspecified: Secondary | ICD-10-CM

## 2015-12-08 NOTE — Patient Outreach (Signed)
[facility]) Care Management  12/08/2015  Melanie Lowe 09-Mar-1924 [id number]   Referral from [facility]; listed contact person-Melanie [phone]:  Subjective:  Telephone to contact-Melanie Lowe who advises that patient is very hard of hearing and can not take phone calls. Contact person/daughter-in-law was advised of reason for call and of [facility] care management services.  Daughter-in-law voices that she and her spouse assist with patient's care. States patient able to do her own personal care and is able to walk without assistance. States she does have walker or cane to use if needed. Patient attends doctor's appointments as scheduled and daughter-in-law takes her to appointments. States medical conditions include COPD and Heart failure. States patient is on oxygen at 2 liters around the clock and has portable tank to use as needed. Christoper Allegra is provider of oxygen therapy.   Daughter-in-law states patient manages & takes own medications. States currently needs to have Spiriva & Demadex refilled and will do that next week when able to pay. States patient has monthly check of [phone].  States she is able to get samples when MD office has availability.   Daughter-in-law does not have knowledge of COPD or Heart Failure red & yellow zones or action plans. States heart failure is new diagnosis for patient. States was recently hospitalized November 9-12 with heart failure, pneumonia and COPD flair up. States next MD appointment scheduled for Dec 14 th. States they called for follow up appointment following hospital stay and were advised to come at previously scheduled appointment.   Daughter-in-law consents to [facility] care management services. Advised that pharmacy has been trying to make contact and I would advise them of consent for [facility] services.    Objective: See screening assessment as noted.  Assessment: Contact/daughter-in-law consents to [facility] care management services.   Contact/caregiver has knowledge deficit of COPD & Heart failure yellow/red zones and Action Plans. Having difficulty obtaining refill on 2 of medications-Spiriva and Demadex. Patient/caregiver would benefit from community care coordinator referral. Patient would benefit from [facility] care coordinator services.  Plan: Will send to care management assistant to refer to appropriate community care coordinator for case management /disease management of patient with COPD, Heart failure/pneumonia /recent hospital admission 11/9-11/12/2015. Will advise pharmacy of contact with caregiver regarding medication concerns.(contacted 12/08 at 1:10pm) Telephonic signing off. Colleen Can, RN BSN CCM Care Management Coordinator [facility] Care Management  [phone]

## 2015-12-09 ENCOUNTER — Other Ambulatory Visit: Payer: Self-pay | Admitting: Pharmacist

## 2015-12-09 ENCOUNTER — Other Ambulatory Visit: Payer: Self-pay | Admitting: *Deleted

## 2015-12-09 NOTE — Patient Outreach (Signed)
[facility]) Care Management  [facility] CM Pharmacy   12/09/2015  CORENNA FORNSHELL 06-Jun-1924 [id number]  Subjective: Melanie Lowe is a 81 y.o. female who was referred to [facility] for medication assistance, specifically for her inhalers and torsemide.  Per Bonita Quin Manning's note on 12/08/15, patient receives a check of $1186 per month [phone]). The cut off for Extra Help is $13,640.  Patient will obtain samples from the physician's office when they are available. Patient is on Symbicort and Spiriva.  Objective:   Current Medications: Current Outpatient Prescriptions  Medication Sig Dispense Refill  . acetaminophen (TYLENOL) 500 MG tablet Take 250-500 mg by mouth daily as needed for mild pain or moderate pain.     Marland Kitchen albuterol (PROVENTIL) (2.5 MG/3ML) 0.083% nebulizer solution Take 2.5 mg by nebulization 3 (three) times daily.     Marland Kitchen aspirin EC 81 MG tablet Take 81 mg by mouth daily.    . budesonide-formoterol (SYMBICORT) 160-4.5 MCG/ACT inhaler Inhale 2 puffs into the lungs 2 (two) times daily.     . citalopram (CELEXA) 20 MG tablet Take 20 mg by mouth at bedtime.     . diazepam (VALIUM) 10 MG tablet Take 10 mg by mouth 3 (three) times daily as needed for anxiety.    . gabapentin (NEURONTIN) 100 MG capsule Take 100 mg by mouth 3 (three) times daily.     Marland Kitchen levofloxacin (LEVAQUIN) 500 MG tablet Take 1 tablet (500 mg total) by mouth daily. 5 tablet 0  . oxyCODONE-acetaminophen (PERCOCET) 7.5-325 MG tablet Take 1 tablet by mouth 4 (four) times daily as needed. pain  0  . tiotropium (SPIRIVA) 18 MCG inhalation capsule Place 18 mcg into inhaler and inhale every evening.     . torsemide (DEMADEX) 20 MG tablet Take 1 tablet (20 mg total) by mouth 2 (two) times daily. 60 tablet 12   No current facility-administered medications for this visit.    Functional Status: In your present state of health, do you have any difficulty performing the following activities: 11/09/2015 11/02/2015  Hearing? [name]   Vision? N N  Difficulty concentrating or making decisions? N N  Walking or climbing stairs? Y Y  Dressing or bathing? N N  Doing errands, shopping? [name]    Fall/Depression Screening: No flowsheet data found.  Assessment: 1. Medication assistance: Patient will not qualify for Medicare Extra Help due to too high of an income. However, may benefit from applications for Spiriva and Symbicort patient assistance programs. There are no programs to assist with torsemide as it is a generic medication. Patient is out of her medications but will be able to afford to pick them up next week when she gets paid - no urgent breathing issues.  Plan: 1. Medication assistance: patient agreed to application to patient assistance programs. Will refer to Lesle Reek, care management assistant, for application for Spiriva and Symbicort. Patient verbalized understanding and requested that the application be completed with Lyndee Hensen, her daughter-in-law. Contacted Dr. Juanetta Gosling office to help them know that she was out of Symbicort and Spiriva and to request that they call the patient if they have any samples. I had to leave a message with the nurse.   Juanita Craver, PharmD, BCPS Clinical Pharmacist [facility] [phone]

## 2015-12-09 NOTE — Patient Outreach (Signed)
Call to patient home in response to referral to [facility] CM community.  Spoke with [name] and scheduled appointment for initial home visit. Plan to visit 12/16/15 am Alben Spittle. [name], RN, BSN, CCM  [facility] [employer] [phone]

## 2015-12-12 ENCOUNTER — Emergency Department (HOSPITAL_COMMUNITY)
Admission: EM | Admit: 2015-12-12 | Discharge: 2015-12-13 | Disposition: A | Discharge status: Home / Self Care | Payer: Commercial Managed Care - HMO | Attending: Emergency Medicine | Admitting: Emergency Medicine

## 2015-12-12 ENCOUNTER — Emergency Department (HOSPITAL_COMMUNITY): Payer: Commercial Managed Care - HMO

## 2015-12-12 ENCOUNTER — Encounter (HOSPITAL_COMMUNITY): Payer: Self-pay | Admitting: Emergency Medicine

## 2015-12-12 DIAGNOSIS — R109 Unspecified abdominal pain: Secondary | ICD-10-CM | POA: Insufficient documentation

## 2015-12-12 DIAGNOSIS — F419 Anxiety disorder, unspecified: Secondary | ICD-10-CM | POA: Diagnosis not present

## 2015-12-12 DIAGNOSIS — F329 Major depressive disorder, single episode, unspecified: Secondary | ICD-10-CM | POA: Diagnosis not present

## 2015-12-12 DIAGNOSIS — Z8673 Personal history of transient ischemic attack (TIA), and cerebral infarction without residual deficits: Secondary | ICD-10-CM | POA: Insufficient documentation

## 2015-12-12 DIAGNOSIS — Z792 Long term (current) use of antibiotics: Secondary | ICD-10-CM | POA: Insufficient documentation

## 2015-12-12 DIAGNOSIS — Z9981 Dependence on supplemental oxygen: Secondary | ICD-10-CM | POA: Insufficient documentation

## 2015-12-12 DIAGNOSIS — I509 Heart failure, unspecified: Secondary | ICD-10-CM | POA: Insufficient documentation

## 2015-12-12 DIAGNOSIS — I10 Essential (primary) hypertension: Secondary | ICD-10-CM | POA: Diagnosis not present

## 2015-12-12 DIAGNOSIS — G629 Polyneuropathy, unspecified: Secondary | ICD-10-CM | POA: Diagnosis not present

## 2015-12-12 DIAGNOSIS — J449 Chronic obstructive pulmonary disease, unspecified: Secondary | ICD-10-CM | POA: Diagnosis not present

## 2015-12-12 DIAGNOSIS — R079 Chest pain, unspecified: Secondary | ICD-10-CM | POA: Diagnosis present

## 2015-12-12 DIAGNOSIS — R0789 Other chest pain: Secondary | ICD-10-CM | POA: Insufficient documentation

## 2015-12-12 DIAGNOSIS — M1990 Unspecified osteoarthritis, unspecified site: Secondary | ICD-10-CM | POA: Insufficient documentation

## 2015-12-12 DIAGNOSIS — Z8701 Personal history of pneumonia (recurrent): Secondary | ICD-10-CM | POA: Diagnosis not present

## 2015-12-12 DIAGNOSIS — R05 Cough: Secondary | ICD-10-CM | POA: Diagnosis not present

## 2015-12-12 DIAGNOSIS — Z79899 Other long term (current) drug therapy: Secondary | ICD-10-CM | POA: Diagnosis not present

## 2015-12-12 DIAGNOSIS — Z7951 Long term (current) use of inhaled steroids: Secondary | ICD-10-CM | POA: Insufficient documentation

## 2015-12-12 DIAGNOSIS — M150 Primary generalized (osteo)arthritis: Secondary | ICD-10-CM | POA: Diagnosis not present

## 2015-12-12 DIAGNOSIS — Z7982 Long term (current) use of aspirin: Secondary | ICD-10-CM | POA: Insufficient documentation

## 2015-12-12 DIAGNOSIS — R0902 Hypoxemia: Secondary | ICD-10-CM | POA: Diagnosis not present

## 2015-12-12 DIAGNOSIS — R059 Cough, unspecified: Secondary | ICD-10-CM

## 2015-12-12 DIAGNOSIS — I252 Old myocardial infarction: Secondary | ICD-10-CM | POA: Diagnosis not present

## 2015-12-12 DIAGNOSIS — R1030 Lower abdominal pain, unspecified: Secondary | ICD-10-CM | POA: Diagnosis not present

## 2015-12-12 LAB — CBC WITH DIFFERENTIAL/PLATELET
Basophils Absolute: 0 10*3/uL (ref 0.0–0.1)
Basophils Relative: 0 %
Eosinophils Absolute: 0.1 10*3/uL (ref 0.0–0.7)
Eosinophils Relative: 1 %
HCT: 36.4 % (ref 36.0–46.0)
Hemoglobin: 12.3 g/dL (ref 12.0–15.0)
Lymphocytes Relative: 36 %
Lymphs Abs: 2 10*3/uL (ref 0.7–4.0)
MCH: 31.8 pg (ref 26.0–34.0)
MCHC: 33.8 g/dL (ref 30.0–36.0)
MCV: 94.1 fL (ref 78.0–100.0)
Monocytes Absolute: 0.6 10*3/uL (ref 0.1–1.0)
Monocytes Relative: 10 %
Neutro Abs: 3 10*3/uL (ref 1.7–7.7)
Neutrophils Relative %: 53 %
Platelets: 198 10*3/uL (ref 150–400)
RBC: 3.87 MIL/uL (ref 3.87–5.11)
RDW: 12.7 % (ref 11.5–15.5)
WBC: 5.6 10*3/uL (ref 4.0–10.5)

## 2015-12-12 LAB — BRAIN NATRIURETIC PEPTIDE: B Natriuretic Peptide: 32 pg/mL (ref 0.0–100.0)

## 2015-12-12 LAB — URINE MICROSCOPIC-ADD ON

## 2015-12-12 LAB — URINALYSIS, ROUTINE W REFLEX MICROSCOPIC
Bilirubin Urine: NEGATIVE
Glucose, UA: NEGATIVE mg/dL
Ketones, ur: NEGATIVE mg/dL
Leukocytes, UA: NEGATIVE
Nitrite: NEGATIVE
Protein, ur: NEGATIVE mg/dL
Specific Gravity, Urine: 1.02 (ref 1.005–1.030)
pH: 5.5 (ref 5.0–8.0)

## 2015-12-12 LAB — BASIC METABOLIC PANEL
Anion gap: 9 (ref 5–15)
BUN: 27 mg/dL — AB (ref 6–20)
CALCIUM: 9.3 mg/dL (ref 8.9–10.3)
CHLORIDE: 96 mmol/L — AB (ref 101–111)
CO2: 28 mmol/L (ref 22–32)
CREATININE: 1.19 mg/dL — AB (ref 0.44–1.00)
GFR calc non Af Amer: 39 mL/min — ABNORMAL LOW (ref >60)
GFR, EST AFRICAN AMERICAN: 46 mL/min — AB (ref >60)
Glucose, Bld: 82 mg/dL (ref 65–99)
Potassium: 3.5 mmol/L (ref 3.5–5.1)
SODIUM: 133 mmol/L — AB (ref 135–145)

## 2015-12-12 LAB — TROPONIN I: Troponin I: <0.03 ng/mL

## 2015-12-12 MED ORDER — MORPHINE SULFATE (PF) 4 MG/ML IV SOLN
4.0000 mg | INTRAVENOUS | Status: DC | PRN
  Administered 2015-12-12: 4 mg via INTRAVENOUS
  Filled 2015-12-12: qty 1

## 2015-12-12 MED ORDER — ONDANSETRON HCL 4 MG/2ML IJ SOLN
4.0000 mg | Freq: Once | INTRAMUSCULAR | Status: AC
  Administered 2015-12-12: 4 mg via INTRAVENOUS
  Filled 2015-12-12: qty 2

## 2015-12-12 MED ORDER — BENZONATATE 100 MG PO CAPS: 100.0000 mg | ORAL_CAPSULE | Freq: Three times a day (TID) | ORAL | Status: DC

## 2015-12-12 MED ORDER — DIATRIZOATE MEGLUMINE & SODIUM 66-10 % PO SOLN
ORAL | Status: AC
  Filled 2015-12-12: qty 30

## 2015-12-12 MED ORDER — IOHEXOL 300 MG/ML  SOLN
75.0000 mL | Freq: Once | INTRAMUSCULAR | Status: AC | PRN
  Administered 2015-12-12: 75 mL via INTRAVENOUS

## 2015-12-12 NOTE — ED Notes (Signed)
Dr Fayrene Fearing informed of DNR per family.

## 2015-12-12 NOTE — ED Provider Notes (Signed)
CSN: [id number]     Arrival date & time 12/12/15  1758 History   First MD Initiated Contact with Patient 12/12/15 1915     Chief Complaint  Patient presents with  . Chest Pain  . Shortness of Breath      HPI  Patient presents for evaluation of chest pain. Upon her arrival he has status having more abdominal pain. She's had a cough. States been coughing for several weeks. Has been admitted in the last month with diverticulitis. States for pneumonia as well. Have her chest x-ray did not show pneumonia. When she coughs it hurts in her left chest and her left abdomen. Has a known large "inoperable" abdominal aortic aneurysm. Does not have leg pain or numbness. No syncope. No nausea or vomiting. Dry nonproductive cough. No fever.  Past Medical History  Diagnosis Date  . COPD (chronic obstructive pulmonary disease) (HCC)   . Hypertension   . Anxiety   . Depression   . Arthritis   . AAA (abdominal aortic aneurysm) (HCC)   . Neuropathy (HCC)   . Leg swelling   . Oxygen dependent   . Myocardial infarction (HCC)   . Pneumonia   . Stroke (HCC)   . CHF (congestive heart failure) (HCC) 11/09/2015   Past Surgical History  Procedure Laterality Date  . Cholecystectomy    . Appendectomy     Family History  Problem Relation Age of Onset  . Diabetes Brother   . Cancer Brother     Throat cancer, Heart attack  . Heart disease Brother   . Heart attack Brother   . Cancer Father   . Cancer Sister    Social History  Substance Use Topics  . Smoking status: Former Smoker -- 50 years    Types: Cigarettes    Quit date: 12/31/2010  . Smokeless tobacco: Former Neurosurgeon    Quit date: 12/31/2010  . Alcohol Use: No   OB History    No data available     Review of Systems  Constitutional: Negative for fever, chills, diaphoresis, appetite change and fatigue.  HENT: Negative for mouth sores, sore throat and trouble swallowing.   Eyes: Negative for visual disturbance.  Respiratory: Positive for  cough. Negative for chest tightness, shortness of breath and wheezing.   Cardiovascular: Positive for chest pain.  Gastrointestinal: Positive for abdominal pain. Negative for nausea, vomiting, diarrhea and abdominal distention.  Endocrine: Negative for polydipsia, polyphagia and polyuria.  Genitourinary: Negative for dysuria, frequency and hematuria.  Musculoskeletal: Negative for gait problem.  Skin: Negative for color change, pallor and rash.  Neurological: Negative for dizziness, syncope, light-headedness and headaches.  Hematological: Does not bruise/bleed easily.  Psychiatric/Behavioral: Negative for behavioral problems and confusion.      Allergies  Ciprofloxacin hcl; Nsaids; Other; and Tomato  Home Medications   Prior to Admission medications   Medication Sig Start Date End Date Taking? Authorizing Provider  acetaminophen (TYLENOL) 500 MG tablet Take 250-500 mg by mouth daily as needed for mild pain or moderate pain.     Historical Provider, MD  albuterol (PROVENTIL) (2.5 MG/3ML) 0.083% nebulizer solution Take 2.5 mg by nebulization 3 (three) times daily.     Historical Provider, MD  aspirin EC 81 MG tablet Take 81 mg by mouth daily.    Historical Provider, MD  benzonatate (TESSALON) 100 MG capsule Take 1 capsule (100 mg total) by mouth every 8 (eight) hours. 12/12/15   Rolland Porter, MD  budesonide-formoterol [facility] 160-4.5 MCG/ACT inhaler Inhale 2 puffs  into the lungs 2 (two) times daily.     Historical Provider, MD  citalopram (CELEXA) 20 MG tablet Take 20 mg by mouth at bedtime.     Historical Provider, MD  diazepam (VALIUM) 10 MG tablet Take 10 mg by mouth 3 (three) times daily as needed for anxiety.    Historical Provider, MD  gabapentin (NEURONTIN) 100 MG capsule Take 100 mg by mouth 3 (three) times daily.  09/06/15   Historical Provider, MD  levofloxacin (LEVAQUIN) 500 MG tablet Take 1 tablet (500 mg total) by mouth daily. 11/12/15   Kari Baars, MD   oxyCODONE-acetaminophen (PERCOCET) 7.5-325 MG tablet Take 1 tablet by mouth 4 (four) times daily as needed. pain 10/13/15   Historical Provider, MD  tiotropium (SPIRIVA) 18 MCG inhalation capsule Place 18 mcg into inhaler and inhale every evening.     Historical Provider, MD  torsemide (DEMADEX) 20 MG tablet Take 1 tablet (20 mg total) by mouth 2 (two) times daily. 11/12/15   Kari Baars, MD   BP 117/74 mmHg  Pulse 57  Resp 16  Wt 120 lb (54.432 kg)  SpO2 100% Physical Exam  Constitutional: She is oriented to person, place, and time. She appears well-developed and well-nourished. No distress.  HENT:  Head: Normocephalic.  Eyes: Conjunctivae are normal. Pupils are equal, round, and reactive to light. No scleral icterus.  Neck: Normal range of motion. Neck supple. No thyromegaly present.  Cardiovascular: Normal rate and regular rhythm.  Exam reveals no gallop and no friction rub.   No murmur heard. Pulmonary/Chest: Effort normal and breath sounds normal. No respiratory distress. She has no wheezes. She has no rales.  tenderness in the left anterior chest wall. Clear bilateral breath sounds.  Abdominal: Soft. Bowel sounds are normal. She exhibits no distension. There is no tenderness. There is no rebound.  Palpable mass in the left abdomen that does not reproduce her symptoms. Pulsatile consistent with known aneurysm.  Musculoskeletal: Normal range of motion.  Neurological: She is alert and oriented to person, place, and time.  Skin: Skin is warm and dry. No rash noted.  Psychiatric: She has a normal mood and affect. Her behavior is normal.    ED Course  Procedures (including critical care time) Labs Review Labs Reviewed  BASIC METABOLIC PANEL - Abnormal; Notable for the following:    Sodium 133 (*)    Chloride 96 (*)    BUN 27 (*)    Creatinine, Ser 1.19 (*)    GFR calc non Af Amer 39 (*)    GFR calc Af Amer 46 (*)    All other components within normal limits  URINALYSIS,  ROUTINE W REFLEX MICROSCOPIC (NOT AT [facility]) - Abnormal; Notable for the following:    Hgb urine dipstick TRACE (*)    All other components within normal limits  URINE MICROSCOPIC-ADD ON - Abnormal; Notable for the following:    Squamous Epithelial / LPF 0-5 (*)    Bacteria, UA FEW (*)    All other components within normal limits  CBC WITH DIFFERENTIAL/PLATELET  BRAIN NATRIURETIC PEPTIDE  TROPONIN I    Imaging Review Dg Chest 2 View  12/12/2015  CLINICAL DATA:  Recent pneumonia with left lower quadrant pain. Anterior chest pain with cough. EXAM: CHEST  2 VIEW COMPARISON:  11/11/2015. FINDINGS: Hyperinflation. Midline trachea. Moderate cardiomegaly. Atherosclerotic, likely ectatic thoracic aorta. Mild left hemidiaphragm elevation. No pleural effusion or pneumothorax. No congestive failure. Mild scarring in the left mid lung laterally on the frontal  radiograph. Minimal scarring at the right lung base. High-riding humeral heads, consistent with chronic rotator cuff insufficiency. IMPRESSION: No acute cardiopulmonary disease. Hyperinflation and cardiomegaly with aortic atherosclerosis. Electronically Signed   By: Jeronimo Greaves M.D.   On: 12/12/2015 20:57   Ct Abdomen Pelvis W Contrast  12/12/2015  CLINICAL DATA:  Acute onset of left lower quadrant abdominal pain. Initial encounter. EXAM: CT ABDOMEN AND PELVIS WITH CONTRAST TECHNIQUE: Multidetector CT imaging of the abdomen and pelvis was performed using the standard protocol following bolus administration of intravenous contrast. CONTRAST:  75mL OMNIPAQUE IOHEXOL 300 MG/ML  SOLN COMPARISON:  CT of the abdomen and pelvis from 11/29/2015 FINDINGS: Mild bibasilar atelectasis or scarring is noted. Scattered coronary artery calcifications are seen. A large abdominal aortic aneurysm is again noted at and below the level of the renal arteries, measuring approximately 7.5 x 7.4 cm. This is essentially unchanged from the recent prior study, with significant  associated mural thrombus. There is more mild aneurysmal dilatation of the abdominal aorta both proximally and distally. There is no definite evidence of acute leakage at this time. Scattered calcification is noted along the abdominal aorta and its branches, including along the superior mesenteric artery. The right renal artery arises at the level of the aneurysm, while the left renal artery arises along the superior edge of the aneurysm. The liver and spleen are unremarkable in appearance. The gallbladder is within normal limits. The pancreas and adrenal glands are unremarkable. The kidneys are unremarkable in appearance. There is no evidence of hydronephrosis. No renal or ureteral stones are seen. Mild nonspecific perinephric stranding is noted bilaterally. The left kidney is displaced by the aneurysm. No free fluid is identified. The small bowel is unremarkable in appearance. The stomach is within normal limits. No acute vascular abnormalities are seen. The patient is status post appendectomy. Scattered diverticulosis is noted along the descending and proximal sigmoid colon. Contrast progresses to the level of the sigmoid colon. There is no definite evidence of acute diverticulitis. The bladder is mildly distended and grossly unremarkable. Apparent bladder wall thickening at the posterior aspect of the bladder is thought to be chronic in nature. The uterus is grossly unremarkable in appearance. The ovaries are relatively symmetric. No inguinal lymphadenopathy is seen. No acute osseous abnormalities are identified. There is grade 1 anterolisthesis of L3 on L4, reflecting underlying facet disease, and mild grade 1 retrolisthesis of L1 on L2. Multilevel vacuum phenomenon is noted along the lumbar spine. IMPRESSION: 1. No acute abnormality seen to explain the patient's symptoms. 2. Large abdominal aortic aneurysm at and below the level of the renal arteries, measuring 7.5 x 7.4 cm, is essentially unchanged, with  significant associated mural thrombus. Underlying scattered calcification along the abdominal aorta and its branches. 3. Scattered diverticulosis along the descending and proximal sigmoid colon, without evidence of diverticulitis. 4. Mild degenerative change noted along the lumbar spine. 5. Mild bibasilar atelectasis or scarring noted. 6. Scattered coronary artery calcifications noted. Electronically Signed   By: Roanna Raider M.D.   On: 12/12/2015 22:27   I have personally reviewed and evaluated these images and lab results as part of my medical decision-making.   EKG Interpretation None      MDM   Final diagnoses:  Chest pain, unspecified chest pain type  Cough  Abdominal pain, unspecified abdominal location    Stable CT. Unchanged chest x-ray. Giving relief of symptoms. I do not think any additional testing is necessary at this point she is appropriate for discharge  home with simple treatment for her cough was seems to exacerbate her symptoms of her chest pain, and abdominal pain.    Rolland Porter, MD 12/12/15 2352

## 2015-12-12 NOTE — Discharge Instructions (Signed)

## 2015-12-12 NOTE — ED Notes (Signed)
D/cd from hospital Nov 12th for pneumonia.  Having cough for last couple days with today being worse.  C/o SOB. Chest pain from cough.

## 2015-12-16 ENCOUNTER — Other Ambulatory Visit: Payer: Self-pay | Admitting: *Deleted

## 2015-12-16 ENCOUNTER — Encounter: Payer: Self-pay | Admitting: *Deleted
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
## 2015-12-16 NOTE — Patient Instructions (Signed)
w

## 2015-12-20 NOTE — Patient Outreach (Signed)
[facility]) Care Management  12/20/2015  Melanie Lowe 1924-12-31 [id number]   Telephone outreach to patient to assist in completing patient assistance applications. I was unsuccessful in reaching the patient as well as leaving a voicemail due to no voicemail coming on. I will try patient again tomorrow.  Aeron Donaghey L. Vernie Piet, AAS [facility] Care Management Assistant

## 2015-12-21 DIAGNOSIS — I739 Peripheral vascular disease, unspecified: Secondary | ICD-10-CM | POA: Diagnosis not present

## 2015-12-21 DIAGNOSIS — I10 Essential (primary) hypertension: Secondary | ICD-10-CM | POA: Diagnosis not present

## 2015-12-21 DIAGNOSIS — M549 Dorsalgia, unspecified: Secondary | ICD-10-CM | POA: Diagnosis not present

## 2015-12-21 DIAGNOSIS — J449 Chronic obstructive pulmonary disease, unspecified: Secondary | ICD-10-CM | POA: Diagnosis not present

## 2015-12-27 ENCOUNTER — Emergency Department (HOSPITAL_COMMUNITY)
Admission: EM | Admit: 2015-12-27 | Discharge: 2015-12-28 | Disposition: A | Discharge status: Home / Self Care | Payer: Commercial Managed Care - HMO | Attending: Emergency Medicine | Admitting: Emergency Medicine

## 2015-12-27 ENCOUNTER — Emergency Department (HOSPITAL_COMMUNITY): Payer: Commercial Managed Care - HMO

## 2015-12-27 ENCOUNTER — Encounter (HOSPITAL_COMMUNITY): Payer: Self-pay

## 2015-12-27 DIAGNOSIS — F419 Anxiety disorder, unspecified: Secondary | ICD-10-CM | POA: Insufficient documentation

## 2015-12-27 DIAGNOSIS — Z79899 Other long term (current) drug therapy: Secondary | ICD-10-CM | POA: Diagnosis not present

## 2015-12-27 DIAGNOSIS — R0789 Other chest pain: Secondary | ICD-10-CM | POA: Insufficient documentation

## 2015-12-27 DIAGNOSIS — Z8673 Personal history of transient ischemic attack (TIA), and cerebral infarction without residual deficits: Secondary | ICD-10-CM | POA: Insufficient documentation

## 2015-12-27 DIAGNOSIS — Z9981 Dependence on supplemental oxygen: Secondary | ICD-10-CM | POA: Insufficient documentation

## 2015-12-27 DIAGNOSIS — I10 Essential (primary) hypertension: Secondary | ICD-10-CM | POA: Insufficient documentation

## 2015-12-27 DIAGNOSIS — Z87891 Personal history of nicotine dependence: Secondary | ICD-10-CM | POA: Diagnosis not present

## 2015-12-27 DIAGNOSIS — Z7951 Long term (current) use of inhaled steroids: Secondary | ICD-10-CM | POA: Insufficient documentation

## 2015-12-27 DIAGNOSIS — M542 Cervicalgia: Secondary | ICD-10-CM | POA: Diagnosis present

## 2015-12-27 DIAGNOSIS — M436 Torticollis: Secondary | ICD-10-CM | POA: Insufficient documentation

## 2015-12-27 DIAGNOSIS — I252 Old myocardial infarction: Secondary | ICD-10-CM | POA: Insufficient documentation

## 2015-12-27 DIAGNOSIS — M1990 Unspecified osteoarthritis, unspecified site: Secondary | ICD-10-CM | POA: Diagnosis not present

## 2015-12-27 DIAGNOSIS — I509 Heart failure, unspecified: Secondary | ICD-10-CM | POA: Insufficient documentation

## 2015-12-27 DIAGNOSIS — R42 Dizziness and giddiness: Secondary | ICD-10-CM | POA: Diagnosis not present

## 2015-12-27 DIAGNOSIS — G629 Polyneuropathy, unspecified: Secondary | ICD-10-CM | POA: Diagnosis not present

## 2015-12-27 DIAGNOSIS — R001 Bradycardia, unspecified: Secondary | ICD-10-CM | POA: Insufficient documentation

## 2015-12-27 DIAGNOSIS — Z7982 Long term (current) use of aspirin: Secondary | ICD-10-CM | POA: Insufficient documentation

## 2015-12-27 DIAGNOSIS — Z8701 Personal history of pneumonia (recurrent): Secondary | ICD-10-CM | POA: Diagnosis not present

## 2015-12-27 DIAGNOSIS — J449 Chronic obstructive pulmonary disease, unspecified: Secondary | ICD-10-CM | POA: Diagnosis not present

## 2015-12-27 DIAGNOSIS — M791 Myalgia, unspecified site: Secondary | ICD-10-CM

## 2015-12-27 DIAGNOSIS — R109 Unspecified abdominal pain: Secondary | ICD-10-CM | POA: Diagnosis not present

## 2015-12-27 DIAGNOSIS — J9811 Atelectasis: Secondary | ICD-10-CM | POA: Diagnosis not present

## 2015-12-27 DIAGNOSIS — F329 Major depressive disorder, single episode, unspecified: Secondary | ICD-10-CM | POA: Diagnosis not present

## 2015-12-27 LAB — CBC WITH DIFFERENTIAL/PLATELET
BASOS ABS: 0 10*3/uL (ref 0.0–0.1)
BASOS PCT: 0 %
EOS ABS: 0.1 10*3/uL (ref 0.0–0.7)
Eosinophils Relative: 3 %
HEMATOCRIT: 32.3 % — AB (ref 36.0–46.0)
HEMOGLOBIN: 10.8 g/dL — AB (ref 12.0–15.0)
Lymphocytes Relative: 38 %
Lymphs Abs: 1.6 10*3/uL (ref 0.7–4.0)
MCH: 31.9 pg (ref 26.0–34.0)
MCHC: 33.4 g/dL (ref 30.0–36.0)
MCV: 95.3 fL (ref 78.0–100.0)
MONOS PCT: 10 %
Monocytes Absolute: 0.4 10*3/uL (ref 0.1–1.0)
NEUTROS ABS: 2.1 10*3/uL (ref 1.7–7.7)
NEUTROS PCT: 49 %
Platelets: 225 10*3/uL (ref 150–400)
RBC: 3.39 MIL/uL — AB (ref 3.87–5.11)
RDW: 12.8 % (ref 11.5–15.5)
WBC: 4.2 10*3/uL (ref 4.0–10.5)

## 2015-12-27 MED ORDER — ACETAMINOPHEN 325 MG PO TABS
650.0000 mg | ORAL_TABLET | Freq: Once | ORAL | Status: AC
  Administered 2015-12-28: 650 mg via ORAL
  Filled 2015-12-27: qty 2

## 2015-12-27 NOTE — ED Provider Notes (Signed)
CSN: [id number]     Arrival date & time 12/27/15  2115 History  By signing my name below, I, Emmanuella Mensah, attest that this documentation has been prepared under the direction and in the presence of Mancel Bale, MD. Electronically Signed: Angelene Giovanni, ED Scribe. 12/27/2015. 2:47 AM.    Chief Complaint  Patient presents with  . Edema  . Neck Pain   The history is provided by the patient. No language interpreter was used.   HPI Comments: Melanie Lowe is a 81 y.o. female who presents to the Emergency Department complaining of gradually worsening neck pain and swelling onset today. She reports associated dizziness, limited ROM of neck secondary to pain, left abdominal pain, left-sided CP, and bilateral hand joint swelling. She states that she ate oatmeal today. No alleviating factors noted. She explains that she currently lives with her son. She denies any fever, cough, or n/v.     Past Medical History  Diagnosis Date  . COPD (chronic obstructive pulmonary disease) (HCC)   . Hypertension   . Anxiety   . Depression   . Arthritis   . AAA (abdominal aortic aneurysm) (HCC)   . Neuropathy (HCC)   . Leg swelling   . Oxygen dependent   . Myocardial infarction (HCC)   . Pneumonia   . Stroke (HCC)   . CHF (congestive heart failure) (HCC) 11/09/2015   Past Surgical History  Procedure Laterality Date  . Cholecystectomy    . Appendectomy     Family History  Problem Relation Age of Onset  . Diabetes Brother   . Cancer Brother     Throat cancer, Heart attack  . Heart disease Brother   . Heart attack Brother   . Cancer Father   . Cancer Sister    Social History  Substance Use Topics  . Smoking status: Former Smoker -- 50 years    Types: Cigarettes    Quit date: 12/31/2010  . Smokeless tobacco: Former Neurosurgeon    Quit date: 12/31/2010  . Alcohol Use: No   OB History    No data available     Review of Systems  Constitutional: Negative for fever.  Respiratory: Negative  for cough.   Cardiovascular: Positive for chest pain.  Gastrointestinal: Positive for abdominal pain. Negative for nausea and vomiting.  Musculoskeletal: Positive for joint swelling and neck pain.  All other systems reviewed and are negative.     Allergies  Ciprofloxacin hcl; Nsaids; Other; and Tomato  Home Medications   Prior to Admission medications   Medication Sig Start Date End Date Taking? Authorizing Provider  acetaminophen (TYLENOL) 500 MG tablet Take 250-500 mg by mouth daily as needed for mild pain or moderate pain.    Yes Historical Provider, MD  albuterol (PROVENTIL) (2.5 MG/3ML) 0.083% nebulizer solution Take 2.5 mg by nebulization 4 (four) times daily.    Yes Historical Provider, MD  aspirin EC 81 MG tablet Take 81 mg by mouth daily.   Yes Historical Provider, MD  budesonide-formoterol (SYMBICORT) 160-4.5 MCG/ACT inhaler Inhale 2 puffs into the lungs 2 (two) times daily.    Yes Historical Provider, MD  citalopram (CELEXA) 20 MG tablet Take 20 mg by mouth daily.    Yes Historical Provider, MD  diazepam (VALIUM) 10 MG tablet Take 10 mg by mouth 3 (three) times daily as needed for anxiety.   Yes Historical Provider, MD  gabapentin (NEURONTIN) 100 MG capsule Take 100 mg by mouth 5 (five) times daily.  09/06/15  Yes  Historical Provider, MD  oxyCODONE-acetaminophen (PERCOCET) 10-325 MG tablet Take 1 tablet by mouth 4 (four) times daily. 12/21/15  Yes Historical Provider, MD  tiotropium (SPIRIVA) 18 MCG inhalation capsule Place 18 mcg into inhaler and inhale every evening.    Yes Historical Provider, MD  torsemide (DEMADEX) 20 MG tablet Take 1 tablet (20 mg total) by mouth 2 (two) times daily. 11/12/15  Yes Kari Baars, MD  azithromycin (ZITHROMAX Z-PAK) 250 MG tablet 2 po day one, then 1 daily x 4 days 12/28/15   Mancel Bale, MD   BP 124/89 mmHg  Pulse 51  Temp(Src) 98.8 F (37.1 C) (Oral)  Resp 14  Ht  (1.549 m)  Wt 128 lb (58.06 kg)  BMI 24.20 kg/m2  SpO2  98% Physical Exam  Constitutional: She is oriented to person, place, and time. She appears well-developed and well-nourished.  HENT:  Head: Normocephalic and atraumatic.  Eyes: Conjunctivae and EOM are normal. Pupils are equal, round, and reactive to light.  Neck: Phonation normal. Neck supple.  Left sided neck pain, resist movement to the neck because of pain  Cardiovascular: Normal rate and regular rhythm.   Bradycardiac but no irregularity Strong peripheral pulse  Pulmonary/Chest: Effort normal. She has no wheezes. She has rales. She exhibits tenderness.  Rales halfway up on each side Left sided chest wall tenderness  Abdominal: Soft. She exhibits no distension. There is no tenderness. There is no guarding.  Musculoskeletal: Normal range of motion. She exhibits edema.  No leg swelling but has isolated MCP swelling in bilateral hands  Neurological: She is alert and oriented to person, place, and time. She exhibits normal muscle tone.  Skin: Skin is warm and dry.  Psychiatric: She has a normal mood and affect. Her behavior is normal. Judgment and thought content normal.  Nursing note and vitals reviewed.   ED Course  Procedures (including critical care time) DIAGNOSTIC STUDIES: Oxygen Saturation is 100% on RA, normal by my interpretation.    COORDINATION OF CARE: 11:54 PM- Pt advised of plan for treatment and pt agrees. Pt will receive tylenol. She will also receive a chest x-ray, lab work, and an EKG for further evaluation.     Labs Review Labs Reviewed  BASIC METABOLIC PANEL - Abnormal; Notable for the following:    Potassium 3.4 (*)    BUN 21 (*)    Creatinine, Ser 1.08 (*)    GFR calc non Af Amer 44 (*)    GFR calc Af Amer 51 (*)    All other components within normal limits  CBC WITH DIFFERENTIAL/PLATELET - Abnormal; Notable for the following:    RBC 3.39 (*)    Hemoglobin 10.8 (*)    HCT 32.3 (*)    All other components within normal limits  URINALYSIS, ROUTINE W  REFLEX MICROSCOPIC (NOT AT [facility]) - Abnormal; Notable for the following:    Hgb urine dipstick TRACE (*)    All other components within normal limits  URINE MICROSCOPIC-ADD ON - Abnormal; Notable for the following:    Squamous Epithelial / LPF 6-30 (*)    Bacteria, UA MANY (*)    All other components within normal limits  URINE CULTURE    Imaging Review Dg Chest Port 1 View  12/28/2015  CLINICAL DATA:  81 year old female with weakness and left-sided chest pain EXAM: PORTABLE CHEST 1 VIEW COMPARISON:  Chest radiograph dated 12/12/2015 FINDINGS: Single-view of the chest demonstrates bibasilar atelectatic changes/ scarring. There is no focal consolidation. Mild left lung  base density may represent atelectatic changes. Developing pneumonia is not excluded. A small left pleural effusion may be present as there is blunting of the left costophrenic angle. Stable cardiac silhouette. There is osteopenia with degenerative changes of the spine. No acute fracture. IMPRESSION: Bibasilar atelectasis with a faint area of increased density at the left lung base which may represent atelectasis versus developing pneumonia. Clinical correlation is recommended. Trace left pleural effusion may be present. Electronically Signed   By: Elgie Collard M.D.   On: 12/28/2015 00:12     Mancel Bale, MD has personally reviewed and evaluated these images and lab results as part of his  medical decision-making.   EKG Interpretation   Date/Time:  Tuesday December 27 2015 22:18:19 EST Ventricular Rate:  52 PR Interval:  227 QRS Duration: 101 QT Interval:  466 QTC Calculation: 433 R Axis:   81 Text Interpretation:  Sinus rhythm Prolonged PR interval Borderline right  axis deviation Since last tracing PR has lengthened Confirmed by Effie Shy   MD, Mae Cianci ([id number]) on 12/27/2015 11:31:31 PM      MDM   Final diagnoses:  Torticollis  Myalgia     Nonspecific myalgia, with evident arthritis in hands. Suspect cervical  arthritis as well. Chest x-ray shows possible early pneumonia. However, no clinical evidence for same. Doubt early or developing sepsis. Doubt metabolic instability. Doubt ACS.  Nursing Notes Reviewed/ Care Coordinated Applicable Imaging Reviewed Interpretation of Laboratory Data incorporated into ED treatment  The patient appears reasonably screened and/or stabilized for discharge and I doubt any other medical condition or other [facility] requiring further screening, evaluation, or treatment in the ED at this time prior to discharge.  Plan: Home Medications- Zithromax, Tylenol; Home Treatments- rest, heat; return here if the recommended treatment, does not improve the symptoms; Recommended follow up- PCP 3 days   I personally performed the services described in this documentation, which was scribed in my presence. The recorded information has been reviewed and is accurate.    Mancel Bale, MD 12/28/15 [phone]

## 2015-12-27 NOTE — ED Notes (Signed)
Her hands are swelling, she is having neck pain, and she felt like everything was going black and was getting dizzy before she came up here.

## 2015-12-28 DIAGNOSIS — R109 Unspecified abdominal pain: Secondary | ICD-10-CM | POA: Diagnosis not present

## 2015-12-28 DIAGNOSIS — F329 Major depressive disorder, single episode, unspecified: Secondary | ICD-10-CM | POA: Diagnosis not present

## 2015-12-28 DIAGNOSIS — I10 Essential (primary) hypertension: Secondary | ICD-10-CM | POA: Diagnosis not present

## 2015-12-28 DIAGNOSIS — I252 Old myocardial infarction: Secondary | ICD-10-CM | POA: Diagnosis not present

## 2015-12-28 DIAGNOSIS — R279 Unspecified lack of coordination: Secondary | ICD-10-CM | POA: Diagnosis not present

## 2015-12-28 DIAGNOSIS — I509 Heart failure, unspecified: Secondary | ICD-10-CM | POA: Diagnosis not present

## 2015-12-28 DIAGNOSIS — J189 Pneumonia, unspecified organism: Secondary | ICD-10-CM | POA: Diagnosis not present

## 2015-12-28 DIAGNOSIS — R0789 Other chest pain: Secondary | ICD-10-CM | POA: Diagnosis not present

## 2015-12-28 DIAGNOSIS — M436 Torticollis: Secondary | ICD-10-CM | POA: Diagnosis not present

## 2015-12-28 DIAGNOSIS — Z743 Need for continuous supervision: Secondary | ICD-10-CM | POA: Diagnosis not present

## 2015-12-28 DIAGNOSIS — R42 Dizziness and giddiness: Secondary | ICD-10-CM | POA: Diagnosis not present

## 2015-12-28 DIAGNOSIS — F419 Anxiety disorder, unspecified: Secondary | ICD-10-CM | POA: Diagnosis not present

## 2015-12-28 LAB — URINE MICROSCOPIC-ADD ON

## 2015-12-28 LAB — URINALYSIS, ROUTINE W REFLEX MICROSCOPIC
BILIRUBIN URINE: NEGATIVE
Glucose, UA: NEGATIVE mg/dL
KETONES UR: NEGATIVE mg/dL
Leukocytes, UA: NEGATIVE
NITRITE: NEGATIVE
PROTEIN: NEGATIVE mg/dL
SPECIFIC GRAVITY, URINE: 1.01 (ref 1.005–1.030)
pH: 6.5 (ref 5.0–8.0)

## 2015-12-28 LAB — BASIC METABOLIC PANEL
Anion gap: 8 (ref 5–15)
BUN: 21 mg/dL — AB (ref 6–20)
CHLORIDE: 102 mmol/L (ref 101–111)
CO2: 29 mmol/L (ref 22–32)
Calcium: 9.2 mg/dL (ref 8.9–10.3)
Creatinine, Ser: 1.08 mg/dL — ABNORMAL HIGH (ref 0.44–1.00)
GFR calc non Af Amer: 44 mL/min — ABNORMAL LOW (ref >60)
GFR, EST AFRICAN AMERICAN: 51 mL/min — AB (ref >60)
Glucose, Bld: 92 mg/dL (ref 65–99)
POTASSIUM: 3.4 mmol/L — AB (ref 3.5–5.1)
SODIUM: 139 mmol/L (ref 135–145)

## 2015-12-28 MED ORDER — AZITHROMYCIN 250 MG PO TABS: ORAL_TABLET | ORAL | Status: DC

## 2015-12-28 MED ORDER — AZITHROMYCIN 250 MG PO TABS
500.0000 mg | ORAL_TABLET | Freq: Once | ORAL | Status: AC
  Administered 2015-12-28: 500 mg via ORAL
  Filled 2015-12-28: qty 2

## 2015-12-28 NOTE — Discharge Instructions (Signed)
Use heat on the sore areas 3 times a day. Take Tylenol for pain. We are prescribing an antibiotic since the x-ray shows a possible early developing pneumonia. Follow-up with your primary care doctor for checkup in 2 or 3 days to ensure that you're getting better.   Muscle Pain, Adult Muscle pain (myalgia) may be caused by many things, including:  Overuse or muscle strain, especially if you are not in shape. This is the most common cause of muscle pain.  Injury.  Bruises.  Viruses, such as the flu.  Infectious diseases.  Fibromyalgia, which is a chronic condition that causes muscle tenderness, fatigue, and headache.  Autoimmune diseases, including lupus.  Certain drugs, including ACE inhibitors and statins. Muscle pain may be mild or severe. In most cases, the pain lasts only a short time and goes away without treatment. To diagnose the cause of your muscle pain, your health care provider will take your medical history. This means he or she will ask you when your muscle pain began and what has been happening. If you have not had muscle pain for very long, your health care provider may want to wait before doing much testing. If your muscle pain has lasted a long time, your health care provider may want to run tests right away. If your health care provider thinks your muscle pain may be caused by illness, you may need to have additional tests to rule out certain conditions.  Treatment for muscle pain depends on the cause. Home care is often enough to relieve muscle pain. Your health care provider may also prescribe anti-inflammatory medicine. HOME CARE INSTRUCTIONS Watch your condition for any changes. The following actions may help to lessen any discomfort you are feeling:  Only take over-the-counter or prescription medicines as directed by your health care provider.  Apply ice to the sore muscle:  Put ice in a plastic bag.  Place a towel between your skin and the bag.  Leave the  ice on for 15-20 minutes, 3-4 times a day.  You may alternate applying hot and cold packs to the muscle as directed by your health care provider.  If overuse is causing your muscle pain, slow down your activities until the pain goes away.  Remember that it is normal to feel some muscle pain after starting a workout program. Muscles that have not been used often will be sore at first.  Do regular, gentle exercises if you are not usually active.  Warm up before exercising to lower your risk of muscle pain.  Do not continue working out if the pain is very bad. Bad pain could mean you have injured a muscle. SEEK MEDICAL CARE IF:  Your muscle pain gets worse, and medicines do not help.  You have muscle pain that lasts longer than 3 days.  You have a rash or fever along with muscle pain.  You have muscle pain after a tick bite.  You have muscle pain while working out, even though you are in good physical condition.  You have redness, soreness, or swelling along with muscle pain.  You have muscle pain after starting a new medicine or changing the dose of a medicine. SEEK IMMEDIATE MEDICAL CARE IF:  You have trouble breathing.  You have trouble swallowing.  You have muscle pain along with a stiff neck, fever, and vomiting.  You have severe muscle weakness or cannot move part of your body. MAKE SURE YOU:   Understand these instructions.  Will watch your condition.  Will get help right away if you are not doing well or get worse.   This information is not intended to replace advice given to you by your health care provider. Make sure you discuss any questions you have with your health care provider.   Document Released: 11/08/2006 Document Revised: 01/07/2015 Document Reviewed: 10/13/2013 Elsevier Interactive Patient Education [employer]

## 2015-12-29 LAB — URINE CULTURE

## 2016-01-01 DIAGNOSIS — J449 Chronic obstructive pulmonary disease, unspecified: Secondary | ICD-10-CM | POA: Diagnosis not present

## 2016-01-01 DIAGNOSIS — R0902 Hypoxemia: Secondary | ICD-10-CM | POA: Diagnosis not present

## 2016-01-01 DIAGNOSIS — F419 Anxiety disorder, unspecified: Secondary | ICD-10-CM | POA: Diagnosis not present

## 2016-01-09 NOTE — Patient Outreach (Signed)
[facility]) Care Management  01/09/2016  SHANIQUE SVEUM 11-02-1924 [id number]   Telephone outreach to [employer], patients relative, to complete the patient assistance applications for Symbicort and Spiriva. Patient does not yet qualify to complete the application for Symbicort until she's spent out 3% of her income on prescription drugs. We did complete the application for Spiriva and I am mailing the application to the patient for signature. Once I receive the application back, I will send it to the provider for completion and then send it off to the pharmacy program. I will follow up in 2 weeks.  Jupiter Kabir L. Lama Narayanan, AAS [facility] Care Management Assistant

## 2016-01-11 ENCOUNTER — Ambulatory Visit: Payer: Self-pay | Admitting: *Deleted

## 2016-01-11 ENCOUNTER — Other Ambulatory Visit: Payer: Self-pay | Admitting: *Deleted

## 2016-01-11 NOTE — Patient Outreach (Signed)
Call from patient caregiver, Melanie Lowe, she states they need to cancel today's appointment due to driveway not passable. She states also that patient has started running low grade temperature and has congestion. Request RNCM call MD office to check on getting MD to call in prescription. Plan to call MD office regarding patient symptoms. Plan to call and reschedule visit later in month. Alben Spittle. [name], RN, BSN, CCM  [facility] [employer] [phone]

## 2016-01-11 NOTE — Patient Outreach (Signed)
Call to MD office, left VM for Joy, MD nurse regarding patient symptoms. Left contact for RNCM and Caregiver, [name]. Alben Spittle. [name], RN, BSN, CCM  [facility] [employer] [phone]

## 2016-01-12 ENCOUNTER — Other Ambulatory Visit: Payer: Self-pay | Admitting: *Deleted

## 2016-01-12 DIAGNOSIS — R0902 Hypoxemia: Secondary | ICD-10-CM | POA: Diagnosis not present

## 2016-01-12 DIAGNOSIS — M150 Primary generalized (osteo)arthritis: Secondary | ICD-10-CM | POA: Diagnosis not present

## 2016-01-12 NOTE — Patient Outreach (Signed)
Call to patient, spoke with [name], patient caregiver.  She reports that Dr. Juanetta Gosling office called in antibiotic for patient and she will start it today. Caregiver requests RNCM call back next week to schedule home visit. RNCM instructed that if patient conditioned worsened after starting antibiotic to let MD know, she verbalizes understanding and agrees to plan Plan to call next week and attempt to reschedule home visit. Alben Spittle. [name], RN, BSN, CCM  [facility] [employer] [phone]

## 2016-01-16 ENCOUNTER — Other Ambulatory Visit: Payer: Self-pay | Admitting: Pharmacist

## 2016-01-16 NOTE — Patient Outreach (Signed)
[facility]) Care Management  [facility] CM Pharmacy   01/16/2016  Melanie Lowe 03-13-1924 [id number]  Subjective: Melanie Lowe is a 82 y.o. female who was referred to [facility] for medication assistance, specifically for her inhalers and torsemide.  Patient is currently working with Lesle Reek, care management assistant, on medication assistance. I am leaving my current position and will transfer pharmacy care to Steve Rattler, PharmD.  Objective:   Current Medications: Current Outpatient Prescriptions  Medication Sig Dispense Refill  . acetaminophen (TYLENOL) 500 MG tablet Take 250-500 mg by mouth daily as needed for mild pain or moderate pain.     Marland Kitchen albuterol (PROVENTIL) (2.5 MG/3ML) 0.083% nebulizer solution Take 2.5 mg by nebulization 4 (four) times daily.     Marland Kitchen aspirin EC 81 MG tablet Take 81 mg by mouth daily.    Marland Kitchen azithromycin (ZITHROMAX Z-PAK) 250 MG tablet 2 po day one, then 1 daily x 4 days 5 tablet 0  . budesonide-formoterol (SYMBICORT) 160-4.5 MCG/ACT inhaler Inhale 2 puffs into the lungs 2 (two) times daily.     . citalopram (CELEXA) 20 MG tablet Take 20 mg by mouth daily.     . diazepam (VALIUM) 10 MG tablet Take 10 mg by mouth 3 (three) times daily as needed for anxiety.    . gabapentin (NEURONTIN) 100 MG capsule Take 100 mg by mouth 5 (five) times daily.     Marland Kitchen oxyCODONE-acetaminophen (PERCOCET) 10-325 MG tablet Take 1 tablet by mouth 4 (four) times daily.  0  . tiotropium (SPIRIVA) 18 MCG inhalation capsule Place 18 mcg into inhaler and inhale every evening.     . torsemide (DEMADEX) 20 MG tablet Take 1 tablet (20 mg total) by mouth 2 (two) times daily. 60 tablet 12   No current facility-administered medications for this visit.    Functional Status: In your present state of health, do you have any difficulty performing the following activities: 12/16/2015 11/09/2015  Hearing? N Y  Vision? N N  Difficulty concentrating or making decisions? N N  Walking or  climbing stairs? Y Y  Dressing or bathing? N N  Doing errands, shopping? [name]  Preparing Food and eating ? Y -  Using the Toilet? N -  In the past six months, have you accidently leaked urine? N -  Do you have problems with loss of bowel control? N -  Managing your Medications? Y -  Managing your Finances? Y -  Housekeeping or managing your Housekeeping? Y -    Fall/Depression Screening: PHQ 2/9 Scores 12/16/2015  PHQ - 2 Score 2  PHQ- 9 Score 4    Assessment: 1. Medication assistance: Patient is working on [employer] patient assistance program with [employer] and will work on [employer] once she meets the eligibility requirements (must spend 3% of income on prescription medications)  Plan: 1. Medication assistance: patient will continue to follow with Lesle Reek, care management assistant. I will add Steve Rattler, PharmD, to the care team as the pharmacist as I will no longer be available to assist this patient.    Juanita Craver, PharmD, BCPS Clinical Pharmacist [facility] [phone]

## 2016-01-25 ENCOUNTER — Other Ambulatory Visit: Payer: Self-pay | Admitting: *Deleted

## 2016-01-25 ENCOUNTER — Encounter: Payer: Self-pay | Admitting: *Deleted

## 2016-01-25 NOTE — Patient Outreach (Signed)
[facility]) Care Management   01/25/2016  Melanie Lowe 1924/01/04 [id number]  Melanie Lowe is an 82 y.o. female  Met with patient, caregiver unable to attend visit.   Subjective:  "I have had the worst pain in my hands and arms"  "My weight is down to 126 pounds"  Patient has seen Dr. Luan Pulling Patient reports she finished her antibiotics and feels better as far as congestion, still has some inflammation in her ands arms.  Patient excited to be turning 27 in a couple of days, she states one of her grandparents lived to be 11 years old.    Objective:   BP 98/60 mmHg  Pulse 80  Resp 20  Wt 126 lb (57.153 kg)  SpO2 93% Review of Systems  Constitutional: Negative.   HENT: Negative.   Eyes: Negative.   Respiratory: Negative.   Cardiovascular: Negative.   Gastrointestinal: Negative.   Genitourinary: Negative.   Musculoskeletal: Positive for joint pain.       Hands, wrists, elbow pain  Skin: Negative.   Neurological: Negative.   Endo/Heme/Allergies: Negative.   Psychiatric/Behavioral: Negative.     Physical Exam  Constitutional: She is oriented to person, place, and time. She appears well-developed.  HENT:  Head: Normocephalic.  Neck: Normal range of motion.  Cardiovascular: Normal rate and regular rhythm.   Respiratory: Effort normal and breath sounds normal.  GI: Soft. Bowel sounds are normal.  Neurological: She is alert and oriented to person, place, and time.  Skin: Skin is warm.  Psychiatric: She has a normal mood and affect. Her behavior is normal. Judgment and thought content normal.    Current Medications:   Current Outpatient Prescriptions  Medication Sig Dispense Refill  . acetaminophen (TYLENOL) 500 MG tablet Take 250-500 mg by mouth daily as needed for mild pain or moderate pain.     Marland Kitchen albuterol (PROVENTIL) (2.5 MG/3ML) 0.083% nebulizer solution Take 2.5 mg by nebulization 4 (four) times daily.     Marland Kitchen aspirin EC 81 MG tablet Take 81 mg by  mouth daily.    Marland Kitchen azithromycin (ZITHROMAX Z-PAK) 250 MG tablet 2 po day one, then 1 daily x 4 days 5 tablet 0  . budesonide-formoterol (SYMBICORT) 160-4.5 MCG/ACT inhaler Inhale 2 puffs into the lungs 2 (two) times daily.     . citalopram (CELEXA) 20 MG tablet Take 20 mg by mouth daily.     . diazepam (VALIUM) 10 MG tablet Take 10 mg by mouth 3 (three) times daily as needed for anxiety.    . gabapentin (NEURONTIN) 100 MG capsule Take 100 mg by mouth 5 (five) times daily.     Marland Kitchen oxyCODONE-acetaminophen (PERCOCET) 10-325 MG tablet Take 1 tablet by mouth 4 (four) times daily.  0  . tiotropium (SPIRIVA) 18 MCG inhalation capsule Place 18 mcg into inhaler and inhale every evening.     . torsemide (DEMADEX) 20 MG tablet Take 1 tablet (20 mg total) by mouth 2 (two) times daily. 60 tablet 12   No current facility-administered medications for this visit.    Functional Status:   In your present state of health, do you have any difficulty performing the following activities: 01/25/2016 12/16/2015  Hearing? N N  Vision? N N  Difficulty concentrating or making decisions? N N  Walking or climbing stairs? Y Y  Dressing or bathing? N N  Doing errands, shopping? Tempie Donning  Preparing Food and eating ? Y Y  Using the Toilet? N N  In the  past six months, have you accidently leaked urine? N N  Do you have problems with loss of bowel control? N N  Managing your Medications? Y Y  Managing your Finances? Tempie Donning  Housekeeping or managing your Housekeeping? Tempie Donning    Fall/Depression Screening:    PHQ 2/9 Scores 12/16/2015  PHQ - 2 Score 2  PHQ- 9 Score 4    Assessment:   HF -Patient is monitoring daily weights and HF symptoms Pain-joint pain  Plan:  [facility] CM Care Plan Problem One        Most Recent Value   Care Plan Problem One  knowledge deficit related to HF as evidenced by new diagnosis and questions around HF zones   Role Documenting the Problem One  Care Management [place] for Problem One   Active   [facility] Long Term Goal (31-90 days)  Patient and or caregiver will be able to verbalize HF symptoms and understand when to contact MD or 911 over the next 90 days   THN Long Term Goal Start Date  12/16/15   Interventions for Problem One Long Term Goal  Reinforced HF zones that are in the [facility] calendar   THN CM Short Term Goal #1 (0-30 days)  Patient will be compliant with daily weights and record in [facility] calendar on weight log over the next 30 days   THN CM Short Term Goal #1 Start Date  12/16/15   Interventions for Short Term Goal #1  praised patient for daily weights, encouraged to continue monitoring and logging and monitoring for fluid weight gain    [facility] CM Care Plan Problem Two        Most Recent Value   Care Plan Problem Two  Patient needs follow up appointment with Primary Care MD as evidenced by had to cancel 12/14/15 appointment due to transportatio   Role Documenting the Problem Two  Care Management [place] for Problem Two  Not Active   THN CM Short Term Goal #1 (0-30 days)  Patient will see primary care MD over next 21 days   THN CM Short Term Goal #1 Start Date  12/16/15   [facility] CM Short Term Goal #1 Met Date   01/25/16   Interventions for Short Term Goal #2   Patient has been able to see MD and is using [employer] for needed transportation     Speak with caregiver in a few days, schedule next month's visit   Lakayla Barrington E. Laymond Purser, RN, BSN, [place] [phone]

## 2016-02-01 DIAGNOSIS — R0902 Hypoxemia: Secondary | ICD-10-CM | POA: Diagnosis not present

## 2016-02-01 DIAGNOSIS — F419 Anxiety disorder, unspecified: Secondary | ICD-10-CM | POA: Diagnosis not present

## 2016-02-01 DIAGNOSIS — J449 Chronic obstructive pulmonary disease, unspecified: Secondary | ICD-10-CM | POA: Diagnosis not present

## 2016-02-03 NOTE — Patient Outreach (Signed)
[facility]) Care Management  02/03/2016  DUA DISHON [birth year] [id number]   Telephone outreach to patient to check the status of the patient assistance application mailed to the patient for signature. I had to leave a HIPPA compliant message for her to call me back.  Lachele Lievanos L. Reyn Faivre, AAS [facility] Care Management Assistant

## 2016-02-12 DIAGNOSIS — R0902 Hypoxemia: Secondary | ICD-10-CM | POA: Diagnosis not present

## 2016-02-12 DIAGNOSIS — M150 Primary generalized (osteo)arthritis: Secondary | ICD-10-CM | POA: Diagnosis not present

## 2016-02-22 ENCOUNTER — Ambulatory Visit: Payer: Self-pay | Admitting: *Deleted

## 2016-02-28 ENCOUNTER — Other Ambulatory Visit: Payer: Self-pay | Admitting: *Deleted

## 2016-02-28 ENCOUNTER — Encounter: Payer: Self-pay | Admitting: *Deleted

## 2016-02-28 NOTE — Patient Outreach (Signed)
[facility]) Care Management   02/28/2016  Melanie Lowe [birth year] [id number]  Melanie Lowe is an 82 y.o. female  Subjective:  "I am tired" Reports she is tired from getting up and going to bathroom at night Taking 2nd fluid pill at 10 pm  "The doctor told me I don't have much time to live"  Patient wants to enjoy her life and does not feel she needs Hospice at this time. She does report she would like to go to [place] to visit daughter, but is not making any plans, states she talks with her on the phone about everyday.  Caregiver reports she cannot find the paperwork for medication assistance, requests another application.  No pain this visit but patient states she is still having issues with joints in hands and wrist at times especially when the weather changes, taking gabapentin, pain medications and using heat for comfort.  Patient would like to gain some weight, may try boost or ensure supplement.  Objective:  Patient neatly groomed sitting on front porch, wearing portable oxygen BP 100/60 mmHg  Pulse 90  Resp 20  Wt 116 lb (52.617 kg)  SpO2 93%  Review of Systems  Constitutional: Negative.   HENT: Negative.   Eyes: Negative.   Respiratory: Negative.   Cardiovascular: Negative.   Gastrointestinal: Negative.   Genitourinary: Negative.   Musculoskeletal: Positive for joint pain.  Skin: Negative.   Endo/Heme/Allergies: Negative.   Psychiatric/Behavioral: Negative.     Physical Exam  Constitutional: She is oriented to person, place, and time. She appears well-developed.  Neck: Normal range of motion.  Cardiovascular: Normal rate and regular rhythm.   Respiratory: Effort normal and breath sounds normal.  GI: Soft. Bowel sounds are normal.  Musculoskeletal: Normal range of motion.  Neurological: She is alert and oriented to person, place, and time.  Skin: Skin is warm and dry.  Psychiatric: She has a normal mood and affect.    Current Medications:    Current Outpatient Prescriptions  Medication Sig Dispense Refill  . acetaminophen (TYLENOL) 500 MG tablet Take 250-500 mg by mouth daily as needed for mild pain or moderate pain.     Marland Kitchen albuterol (PROVENTIL) (2.5 MG/3ML) 0.083% nebulizer solution Take 2.5 mg by nebulization 4 (four) times daily.     Marland Kitchen aspirin EC 81 MG tablet Take 81 mg by mouth daily.    . budesonide-formoterol (SYMBICORT) 160-4.5 MCG/ACT inhaler Inhale 2 puffs into the lungs 2 (two) times daily.     . citalopram (CELEXA) 20 MG tablet Take 20 mg by mouth daily.     . diazepam (VALIUM) 10 MG tablet Take 10 mg by mouth 3 (three) times daily as needed for anxiety.    . gabapentin (NEURONTIN) 100 MG capsule Take 100 mg by mouth 5 (five) times daily.     Marland Kitchen oxyCODONE-acetaminophen (PERCOCET) 10-325 MG tablet Take 1 tablet by mouth 4 (four) times daily.  0  . tiotropium (SPIRIVA) 18 MCG inhalation capsule Place 18 mcg into inhaler and inhale every evening.     . torsemide (DEMADEX) 20 MG tablet Take 1 tablet (20 mg total) by mouth 2 (two) times daily. 60 tablet 12  . azithromycin (ZITHROMAX Z-PAK) 250 MG tablet 2 po day one, then 1 daily x 4 days (Patient not taking: Reported on 02/28/2016) 5 tablet 0   No current facility-administered medications for this visit.    Functional Status:   In your present state of health, do you have any difficulty  performing the following activities: 02/28/2016 01/25/2016  Hearing? N N  Vision? N N  Difficulty concentrating or making decisions? N N  Walking or climbing stairs? Y Y  Dressing or bathing? N N  Doing errands, shopping? Tempie Donning  Preparing Food and eating ? Y Y  Using the Toilet? N N  In the past six months, have you accidently leaked urine? N N  Do you have problems with loss of bowel control? N N  Managing your Medications? Y Y  Managing your Finances? Tempie Donning  Housekeeping or managing your Housekeeping? Tempie Donning    Fall/Depression Screening:    BP 100/60 mmHg  Pulse 90  Resp 20  Wt 116 lb  (52.617 kg)  SpO2 93% PHQ 2/9 Scores 12/16/2015  PHQ - 2 Score 2  PHQ- 9 Score 4    Assessment:   HF-no symptoms this visit, compliant with daily weights Medication assistance-needs new application  Plan:  [facility] CM Care Plan Problem One        Most Recent Value   Care Plan Problem One  knowledge deficit related to HF as evidenced by new diagnosis and questions around HF zones   Role Documenting the Problem One  Care Management Coordinator   [facility] Long Term Goal (31-90 days)  Patient and or caregiver will be able to verbalize HF symptoms and understand when to contact MD or 911 over the next 90 days   [facility]N Long Term Goal Start Date  12/16/15   Interventions for Problem One Long Term Goal  reviewed daily weights, using teachback, educated on taking 2nd fluid pill earlier in evening to get more rest, monitor for readiness to discharge to [facility] at next visit   [facility]N CM Short Term Goal #1 (0-30 days)  Patient will be compliant with daily weights and record in [facility] calendar on weight log over the next 30 days   THN CM Short Term Goal #1 Start Date  12/16/15   [facility] CM Short Term Goal #1 Met Date  02/28/16   Interventions for Short Term Goal #1  compliant with daily weights    THN CM Care Plan Problem Three        Most Recent Value   Care Plan Problem Three  Medication affordability    Role Documenting the Problem Three  Care Management [place] for Problem Three  Active   THN CM Short Term Goal #1 (0-30 days)  over the next30 days patient caregiver will have required documents for medication assistance   [facility]N CM Short Term Goal #1 Start Date  02/28/16   Interventions for Short Term Goal #1  Reviewed necessary documents, will request Case Management assistant to resend application for Spiriva assistance     Caregiver will get needed copies for medication assistance Patient will take 2nd fluid pill earlier in evening so she can get better rest at night RNCM reviewed diet and nutritional  supplements, education around increased calorie intake and avoiding sodium and extra fluid. RNCM will call next month, visit if needed and then transfer to [place] as indicated  Royetta Crochet. Laymond Purser, RN, BSN, [place] [phone]

## 2016-02-29 DIAGNOSIS — F419 Anxiety disorder, unspecified: Secondary | ICD-10-CM | POA: Diagnosis not present

## 2016-02-29 DIAGNOSIS — R0902 Hypoxemia: Secondary | ICD-10-CM | POA: Diagnosis not present

## 2016-02-29 DIAGNOSIS — J449 Chronic obstructive pulmonary disease, unspecified: Secondary | ICD-10-CM | POA: Diagnosis not present

## 2016-03-06 DIAGNOSIS — R531 Weakness: Secondary | ICD-10-CM | POA: Diagnosis not present

## 2016-03-11 DIAGNOSIS — R079 Chest pain, unspecified: Secondary | ICD-10-CM | POA: Diagnosis not present

## 2016-03-11 DIAGNOSIS — R001 Bradycardia, unspecified: Secondary | ICD-10-CM | POA: Diagnosis not present

## 2016-03-11 DIAGNOSIS — R0902 Hypoxemia: Secondary | ICD-10-CM | POA: Diagnosis not present

## 2016-03-11 DIAGNOSIS — M150 Primary generalized (osteo)arthritis: Secondary | ICD-10-CM | POA: Diagnosis not present

## 2016-03-19 ENCOUNTER — Other Ambulatory Visit: Payer: Self-pay | Admitting: *Deleted

## 2016-03-19 ENCOUNTER — Ambulatory Visit: Payer: Self-pay | Admitting: *Deleted

## 2016-03-19 NOTE — Patient Outreach (Signed)
Call from [place], patient caregiver. She reports that patient has requested needed information from [facility] for medication assistance, hoping to to have by end of week. Patient has primary care appointment tomorrow, routine. She reports patient is doing really well, no issues to report, just will see MD for scheduled visit.  She requests RNCM visit next week.  Plan Reschedule visit for 03/27/16 and visit next week, will assess for discharge from [facility] community at visit [facility] E. [name], RN, BSN, CCM  [facility] [employer] [phone]

## 2016-03-20 NOTE — Patient Outreach (Signed)
[facility]) Care Management  03/20/2016  Melanie Lowe May 01, 1924 [id number]   Per Verdie Drown, RN note, patient is still waiting to get income documentation to attach to the patient assistance application. Steve Rattler, PharmD will follow on status of application completion and have it sent off to pharmacy assistance program. I am removing myself from the care team.  Lucan Riner L. Ihan Pat, AAS [facility] Care Management Assistant

## 2016-03-27 ENCOUNTER — Other Ambulatory Visit: Payer: Self-pay | Admitting: *Deleted

## 2016-03-27 NOTE — Patient Outreach (Signed)
[facility] [facility]) Care Management   03/27/2016  JACQUILINE ZURCHER [birth year] [id number]  FADUMA CHO is an 82 y.o. female  Subjective:  "My hands are dead" patient complains of neuropathy in hands. Patient still being cared for by son's girlfriend, [name]. Patient missed appointment with Dr. Luan Pulling due to transportation issues, states they were depending on a friend versus using [facility] program.   Objective:   BP 102/60 mmHg  Pulse 60  Resp 20  Wt 114 lb (51.71 kg)  SpO2 92% Review of Systems  Constitutional: Negative.   HENT: Negative.   Eyes: Negative.   Respiratory: Negative.   Cardiovascular: Negative.   Genitourinary: Negative.   Musculoskeletal: Negative.   Skin: Negative.   Neurological:       Neuropathy in her hands  Psychiatric/Behavioral: Negative.     Physical Exam  Constitutional: She is oriented to person, place, and time. She appears well-developed and well-nourished.  Cardiovascular: Normal rate.   Respiratory: Effort normal and breath sounds normal.  GI: Soft.  Musculoskeletal: Normal range of motion.  Neurological: She is alert and oriented to person, place, and time.  Skin: Skin is warm and dry.    Current Medications:   Current Outpatient Prescriptions  Medication Sig Dispense Refill  . acetaminophen (TYLENOL) 500 MG tablet Take 250-500 mg by mouth daily as needed for mild pain or moderate pain.     Marland Kitchen albuterol (PROVENTIL) (2.5 MG/3ML) 0.083% nebulizer solution Take 2.5 mg by nebulization 4 (four) times daily.     Marland Kitchen aspirin EC 81 MG tablet Take 81 mg by mouth daily.    . budesonide-formoterol (SYMBICORT) 160-4.5 MCG/ACT inhaler Inhale 2 puffs into the lungs 2 (two) times daily.     . citalopram (CELEXA) 20 MG tablet Take 20 mg by mouth daily.     . diazepam (VALIUM) 10 MG tablet Take 10 mg by mouth 3 (three) times daily as needed for anxiety.    . gabapentin (NEURONTIN) 100 MG capsule Take 100 mg by mouth 5 (five) times daily.     Marland Kitchen  oxyCODONE-acetaminophen (PERCOCET) 10-325 MG tablet Take 1 tablet by mouth 4 (four) times daily.  0  . tiotropium (SPIRIVA) 18 MCG inhalation capsule Place 18 mcg into inhaler and inhale every evening.     . torsemide (DEMADEX) 20 MG tablet Take 1 tablet (20 mg total) by mouth 2 (two) times daily. 60 tablet 12  . azithromycin (ZITHROMAX Z-PAK) 250 MG tablet 2 po day one, then 1 daily x 4 days (Patient not taking: Reported on 02/28/2016) 5 tablet 0   No current facility-administered medications for this visit.    Functional Status:   In your present state of health, do you have any difficulty performing the following activities: 02/28/2016 01/25/2016  Hearing? N N  Vision? N N  Difficulty concentrating or making decisions? N N  Walking or climbing stairs? Y Y  Dressing or bathing? N N  Doing errands, shopping? Tempie Donning  Preparing Food and eating ? Y Y  Using the Toilet? N N  In the past six months, have you accidently leaked urine? N N  Do you have problems with loss of bowel control? N N  Managing your Medications? Y Y  Managing your Finances? Tempie Donning  Housekeeping or managing your Housekeeping? Y Y    Fall/Depression Screening:    Fall Risk  01/25/2016 12/16/2015  Falls in the past year? No No  Risk for fall due to : Impaired balance/gait History  of fall(s)  Risk for fall due to (comments): - 2015   PHQ 2/9 Scores 12/16/2015  PHQ - 2 Score 2  PHQ- 9 Score 4    Assessment:   HF-patient without any symptoms. Medication assistance-picked up copies of needed documents, had patient sign PAP form. Gave number for Low income subsidy  Needs follow up with Primary care-call to office, left vm requesting appointment Transportation issues: reviewed [facility] Plan:  [facility] CM Care Plan Problem One        Most Recent Value   Care Plan Problem One  knowledge deficit related to HF as evidenced by new diagnosis and questions around HF zones   Role Documenting the Problem One  Care Management [place] for Problem One  Active   THN Long Term Goal (31-90 days)  Patient and or caregiver will be able to verbalize HF symptoms and understand when to contact MD or 911 over the next 90 days   THN Long Term Goal Start Date  12/16/15   [facility] Long Term Goal Met Date  03/27/16   Interventions for Problem One Long Term Goal  Reinstructed on low salt diet, and importance of self monitoring and daily weights. Reviewed taking 2nd fluid pill earlier in day so patient not up all night to void   THN CM Short Term Goal #1 (0-30 days)  Patient will be compliant with daily weights and record in [facility] calendar on weight log over the next 30 days   THN CM Short Term Goal #1 Start Date  12/16/15   [facility] CM Short Term Goal #1 Met Date  02/28/16    [facility] CM Care Plan Problem Two        Most Recent Value   Care Plan Problem Two  Patient needs follow up appointment with Primary Care MD as evidenced by had to cancel 12/14/15 appointment due to transportatio   Role Documenting the Problem Two  Care Management Coordinator   Care Plan for Problem Two  Not Active    [facility] CM Care Plan Problem Three        Most Recent Value   Care Plan Problem Three  Medication affordability    Role Documenting the Problem Three  Care Management Coordinator   Care Plan for Problem Three  Active   THN CM Short Term Goal #1 (0-30 days)  over the next30 days patient caregiver will have required documents for medication assistance   THN CM Short Term Goal #1 Start Date  02/28/16   Interventions for Short Term Goal #1  Reviewed paperwork for PAP program, had patient sign form, will give to [place] program for follow up     Will visit next month and discharge. Patient to see primary care, will use [facility] transportation. [name] to look at [place].  Royetta Crochet. Laymond Purser, RN, BSN, [place] [phone]

## 2016-03-31 DIAGNOSIS — J449 Chronic obstructive pulmonary disease, unspecified: Secondary | ICD-10-CM | POA: Diagnosis not present

## 2016-04-09 ENCOUNTER — Other Ambulatory Visit: Payer: Self-pay

## 2016-04-09 NOTE — Patient Outreach (Signed)
All the paperwork from Melanie Lowe has been received.  A prescription for Spiriva will need to received from the provider as well as the signature from the provider on the paperwork.  Nena Polio, Care Manager Assistant has sent the paperwork and request to the provider.  To date, she has not received anything back.  I will follow up next week to determine if she has heard back from the provider.   Steve Rattler, PharmD, [employer] [phone]

## 2016-04-11 ENCOUNTER — Inpatient Hospital Stay (HOSPITAL_COMMUNITY)
Admission: EM | Admit: 2016-04-11 | Discharge: 2016-04-16 | DRG: 190 | Disposition: A | Discharge status: Home / Self Care | Payer: Commercial Managed Care - HMO | Attending: Pulmonary Disease | Admitting: Pulmonary Disease

## 2016-04-11 ENCOUNTER — Encounter: Payer: Self-pay | Admitting: *Deleted

## 2016-04-11 ENCOUNTER — Encounter (HOSPITAL_COMMUNITY): Payer: Self-pay

## 2016-04-11 ENCOUNTER — Emergency Department (HOSPITAL_COMMUNITY): Payer: Commercial Managed Care - HMO

## 2016-04-11 ENCOUNTER — Other Ambulatory Visit: Payer: Self-pay | Admitting: *Deleted

## 2016-04-11 DIAGNOSIS — Z7982 Long term (current) use of aspirin: Secondary | ICD-10-CM

## 2016-04-11 DIAGNOSIS — I10 Essential (primary) hypertension: Secondary | ICD-10-CM | POA: Diagnosis present

## 2016-04-11 DIAGNOSIS — Z808 Family history of malignant neoplasm of other organs or systems: Secondary | ICD-10-CM | POA: Diagnosis not present

## 2016-04-11 DIAGNOSIS — Z833 Family history of diabetes mellitus: Secondary | ICD-10-CM

## 2016-04-11 DIAGNOSIS — Z9981 Dependence on supplemental oxygen: Secondary | ICD-10-CM

## 2016-04-11 DIAGNOSIS — Z66 Do not resuscitate: Secondary | ICD-10-CM | POA: Diagnosis present

## 2016-04-11 DIAGNOSIS — J9621 Acute and chronic respiratory failure with hypoxia: Secondary | ICD-10-CM | POA: Diagnosis present

## 2016-04-11 DIAGNOSIS — J441 Chronic obstructive pulmonary disease with (acute) exacerbation: Secondary | ICD-10-CM | POA: Diagnosis present

## 2016-04-11 DIAGNOSIS — Z8249 Family history of ischemic heart disease and other diseases of the circulatory system: Secondary | ICD-10-CM | POA: Diagnosis not present

## 2016-04-11 DIAGNOSIS — Z7951 Long term (current) use of inhaled steroids: Secondary | ICD-10-CM | POA: Diagnosis not present

## 2016-04-11 DIAGNOSIS — R0902 Hypoxemia: Secondary | ICD-10-CM | POA: Diagnosis not present

## 2016-04-11 DIAGNOSIS — I714 Abdominal aortic aneurysm, without rupture, unspecified: Secondary | ICD-10-CM | POA: Diagnosis present

## 2016-04-11 DIAGNOSIS — R071 Chest pain on breathing: Secondary | ICD-10-CM | POA: Diagnosis not present

## 2016-04-11 DIAGNOSIS — R0789 Other chest pain: Secondary | ICD-10-CM

## 2016-04-11 DIAGNOSIS — Z8673 Personal history of transient ischemic attack (TIA), and cerebral infarction without residual deficits: Secondary | ICD-10-CM | POA: Diagnosis not present

## 2016-04-11 DIAGNOSIS — J9611 Chronic respiratory failure with hypoxia: Secondary | ICD-10-CM | POA: Diagnosis not present

## 2016-04-11 DIAGNOSIS — Z79891 Long term (current) use of opiate analgesic: Secondary | ICD-10-CM | POA: Diagnosis not present

## 2016-04-11 DIAGNOSIS — F419 Anxiety disorder, unspecified: Secondary | ICD-10-CM | POA: Diagnosis not present

## 2016-04-11 DIAGNOSIS — E440 Moderate protein-calorie malnutrition: Secondary | ICD-10-CM | POA: Insufficient documentation

## 2016-04-11 DIAGNOSIS — F329 Major depressive disorder, single episode, unspecified: Secondary | ICD-10-CM | POA: Diagnosis present

## 2016-04-11 DIAGNOSIS — Z87891 Personal history of nicotine dependence: Secondary | ICD-10-CM

## 2016-04-11 DIAGNOSIS — I252 Old myocardial infarction: Secondary | ICD-10-CM | POA: Diagnosis not present

## 2016-04-11 DIAGNOSIS — Z8701 Personal history of pneumonia (recurrent): Secondary | ICD-10-CM | POA: Diagnosis not present

## 2016-04-11 DIAGNOSIS — J440 Chronic obstructive pulmonary disease with acute lower respiratory infection: Principal | ICD-10-CM | POA: Diagnosis present

## 2016-04-11 DIAGNOSIS — F410 Panic disorder [episodic paroxysmal anxiety] without agoraphobia: Secondary | ICD-10-CM | POA: Diagnosis not present

## 2016-04-11 DIAGNOSIS — Z6820 Body mass index (BMI) 20.0-20.9, adult: Secondary | ICD-10-CM | POA: Diagnosis not present

## 2016-04-11 DIAGNOSIS — R079 Chest pain, unspecified: Secondary | ICD-10-CM | POA: Diagnosis not present

## 2016-04-11 DIAGNOSIS — R0602 Shortness of breath: Secondary | ICD-10-CM | POA: Diagnosis not present

## 2016-04-11 DIAGNOSIS — J189 Pneumonia, unspecified organism: Secondary | ICD-10-CM | POA: Diagnosis present

## 2016-04-11 DIAGNOSIS — R001 Bradycardia, unspecified: Secondary | ICD-10-CM | POA: Diagnosis not present

## 2016-04-11 DIAGNOSIS — R05 Cough: Secondary | ICD-10-CM | POA: Diagnosis not present

## 2016-04-11 DIAGNOSIS — J9620 Acute and chronic respiratory failure, unspecified whether with hypoxia or hypercapnia: Secondary | ICD-10-CM | POA: Diagnosis present

## 2016-04-11 LAB — CBC WITH DIFFERENTIAL/PLATELET
BASOS ABS: 0 10*3/uL (ref 0.0–0.1)
BASOS PCT: 0 %
Eosinophils Absolute: 0 10*3/uL (ref 0.0–0.7)
Eosinophils Relative: 1 %
HEMATOCRIT: 33.4 % — AB (ref 36.0–46.0)
HEMOGLOBIN: 11.2 g/dL — AB (ref 12.0–15.0)
LYMPHS PCT: 19 %
Lymphs Abs: 0.8 10*3/uL (ref 0.7–4.0)
MCH: 31 pg (ref 26.0–34.0)
MCHC: 33.5 g/dL (ref 30.0–36.0)
MCV: 92.5 fL (ref 78.0–100.0)
MONO ABS: 0.3 10*3/uL (ref 0.1–1.0)
MONOS PCT: 6 %
NEUTROS ABS: 3.3 10*3/uL (ref 1.7–7.7)
NEUTROS PCT: 74 %
Platelets: 248 10*3/uL (ref 150–400)
RBC: 3.61 MIL/uL — ABNORMAL LOW (ref 3.87–5.11)
RDW: 13.7 % (ref 11.5–15.5)
WBC: 4.5 10*3/uL (ref 4.0–10.5)

## 2016-04-11 LAB — URINALYSIS, ROUTINE W REFLEX MICROSCOPIC
Bilirubin Urine: NEGATIVE
Glucose, UA: NEGATIVE mg/dL
Hgb urine dipstick: NEGATIVE
KETONES UR: NEGATIVE mg/dL
NITRITE: NEGATIVE
PROTEIN: NEGATIVE mg/dL
Specific Gravity, Urine: <1.005 — ABNORMAL LOW (ref 1.005–1.030)
pH: 5.5 (ref 5.0–8.0)

## 2016-04-11 LAB — COMPREHENSIVE METABOLIC PANEL
ALBUMIN: 3.5 g/dL (ref 3.5–5.0)
ALK PHOS: 61 U/L (ref 38–126)
ALT: 10 U/L — ABNORMAL LOW (ref 14–54)
AST: 19 U/L (ref 15–41)
Anion gap: 8 (ref 5–15)
BILIRUBIN TOTAL: 0.5 mg/dL (ref 0.3–1.2)
BUN: 30 mg/dL — AB (ref 6–20)
CALCIUM: 9 mg/dL (ref 8.9–10.3)
CO2: 30 mmol/L (ref 22–32)
Chloride: 98 mmol/L — ABNORMAL LOW (ref 101–111)
Creatinine, Ser: 1.03 mg/dL — ABNORMAL HIGH (ref 0.44–1.00)
GFR calc Af Amer: 54 mL/min — ABNORMAL LOW (ref >60)
GFR, EST NON AFRICAN AMERICAN: 46 mL/min — AB (ref >60)
GLUCOSE: 118 mg/dL — AB (ref 65–99)
Potassium: 4.2 mmol/L (ref 3.5–5.1)
Sodium: 136 mmol/L (ref 135–145)
TOTAL PROTEIN: 7 g/dL (ref 6.5–8.1)

## 2016-04-11 LAB — I-STAT TROPONIN, ED
TROPONIN I, POC: 0.01 ng/mL (ref 0.00–0.08)
Troponin i, poc: 0 ng/mL (ref 0.00–0.08)

## 2016-04-11 LAB — URINE MICROSCOPIC-ADD ON: RBC / HPF: NONE SEEN RBC/hpf (ref 0–5)

## 2016-04-11 LAB — BRAIN NATRIURETIC PEPTIDE: B NATRIURETIC PEPTIDE 5: 63 pg/mL (ref 0.0–100.0)

## 2016-04-11 MED ORDER — DEXTROSE 5 % IV SOLN
250.0000 mg | INTRAVENOUS | Status: AC
  Administered 2016-04-12 – 2016-04-15 (×4): 250 mg via INTRAVENOUS
  Filled 2016-04-11 (×4): qty 250

## 2016-04-11 MED ORDER — DEXTROSE 5 % IV SOLN
500.0000 mg | Freq: Once | INTRAVENOUS | Status: AC
  Administered 2016-04-11: 500 mg via INTRAVENOUS
  Filled 2016-04-11: qty 500

## 2016-04-11 MED ORDER — DIAZEPAM 5 MG PO TABS
5.0000 mg | ORAL_TABLET | Freq: Every evening | ORAL | Status: DC | PRN
Start: 2016-04-11 — End: 2016-04-16
  Administered 2016-04-13 – 2016-04-15 (×2): 5 mg via ORAL
  Filled 2016-04-11: qty 1

## 2016-04-11 MED ORDER — HYDROCODONE-ACETAMINOPHEN 5-325 MG PO TABS
1.0000 | ORAL_TABLET | Freq: Once | ORAL | Status: AC
  Administered 2016-04-11: 1 via ORAL
  Filled 2016-04-11: qty 1

## 2016-04-11 MED ORDER — SODIUM CHLORIDE 0.9% FLUSH
3.0000 mL | Freq: Two times a day (BID) | INTRAVENOUS | Status: DC
  Administered 2016-04-11 – 2016-04-16 (×8): 3 mL via INTRAVENOUS

## 2016-04-11 MED ORDER — ACETAMINOPHEN 325 MG PO TABS
650.0000 mg | ORAL_TABLET | Freq: Four times a day (QID) | ORAL | Status: DC | PRN
  Administered 2016-04-16: 650 mg via ORAL
  Filled 2016-04-11: qty 2

## 2016-04-11 MED ORDER — SODIUM CHLORIDE 0.45 % IV SOLN
INTRAVENOUS | Status: DC
  Administered 2016-04-12: via INTRAVENOUS

## 2016-04-11 MED ORDER — GABAPENTIN 100 MG PO CAPS
100.0000 mg | ORAL_CAPSULE | Freq: Every day | ORAL | Status: DC
  Administered 2016-04-12 – 2016-04-16 (×23): 100 mg via ORAL
  Filled 2016-04-11 (×23): qty 1

## 2016-04-11 MED ORDER — ACETAMINOPHEN 650 MG RE SUPP: 650.0000 mg | Freq: Four times a day (QID) | RECTAL | Status: DC | PRN

## 2016-04-11 MED ORDER — DOCUSATE SODIUM 100 MG PO CAPS
100.0000 mg | ORAL_CAPSULE | Freq: Two times a day (BID) | ORAL | Status: DC
Start: 2016-04-11 — End: 2016-04-16
  Administered 2016-04-12 – 2016-04-16 (×9): 100 mg via ORAL
  Filled 2016-04-11 (×9): qty 1

## 2016-04-11 MED ORDER — ALBUTEROL SULFATE (2.5 MG/3ML) 0.083% IN NEBU
5.0000 mg | INHALATION_SOLUTION | Freq: Once | RESPIRATORY_TRACT | Status: AC
  Administered 2016-04-11: 5 mg via RESPIRATORY_TRACT
  Filled 2016-04-11: qty 6

## 2016-04-11 MED ORDER — CEFTRIAXONE SODIUM 1 G IJ SOLR
1.0000 g | Freq: Once | INTRAMUSCULAR | Status: AC
  Administered 2016-04-11: 1 g via INTRAVENOUS
  Filled 2016-04-11: qty 10

## 2016-04-11 MED ORDER — ALBUTEROL (5 MG/ML) CONTINUOUS INHALATION SOLN
10.0000 mg/h | INHALATION_SOLUTION | RESPIRATORY_TRACT | Status: DC
  Administered 2016-04-11: 10 mg/h via RESPIRATORY_TRACT
  Filled 2016-04-11: qty 20

## 2016-04-11 MED ORDER — ONDANSETRON HCL 4 MG PO TABS: 4.0000 mg | ORAL_TABLET | Freq: Four times a day (QID) | ORAL | Status: DC | PRN

## 2016-04-11 MED ORDER — IPRATROPIUM-ALBUTEROL 0.5-2.5 (3) MG/3ML IN SOLN
3.0000 mL | Freq: Four times a day (QID) | RESPIRATORY_TRACT | Status: DC
  Administered 2016-04-12 (×2): 3 mL via RESPIRATORY_TRACT
  Filled 2016-04-11 (×3): qty 3

## 2016-04-11 MED ORDER — DIAZEPAM 5 MG PO TABS
10.0000 mg | ORAL_TABLET | Freq: Three times a day (TID) | ORAL | Status: DC | PRN
Start: 2016-04-11 — End: 2016-04-16
  Filled 2016-04-11 (×2): qty 2

## 2016-04-11 MED ORDER — ASPIRIN EC 81 MG PO TBEC
81.0000 mg | DELAYED_RELEASE_TABLET | Freq: Every day | ORAL | Status: DC
  Administered 2016-04-12 – 2016-04-16 (×5): 81 mg via ORAL
  Filled 2016-04-11 (×5): qty 1

## 2016-04-11 MED ORDER — METHYLPREDNISOLONE SODIUM SUCC 125 MG IJ SOLR
60.0000 mg | Freq: Three times a day (TID) | INTRAMUSCULAR | Status: DC
  Administered 2016-04-12 – 2016-04-15 (×11): 60 mg via INTRAVENOUS
  Filled 2016-04-11 (×11): qty 2

## 2016-04-11 MED ORDER — ALBUTEROL SULFATE (2.5 MG/3ML) 0.083% IN NEBU: 2.5000 mg | INHALATION_SOLUTION | RESPIRATORY_TRACT | Status: DC | PRN

## 2016-04-11 MED ORDER — HYDROCODONE-ACETAMINOPHEN 5-325 MG PO TABS: 1.0000 | ORAL_TABLET | ORAL | Status: DC | PRN

## 2016-04-11 MED ORDER — METHYLPREDNISOLONE SODIUM SUCC 125 MG IJ SOLR
125.0000 mg | Freq: Once | INTRAMUSCULAR | Status: AC
  Administered 2016-04-11: 125 mg via INTRAVENOUS
  Filled 2016-04-11: qty 2

## 2016-04-11 MED ORDER — ONDANSETRON HCL 4 MG/2ML IJ SOLN: 4.0000 mg | Freq: Four times a day (QID) | INTRAMUSCULAR | Status: DC | PRN

## 2016-04-11 MED ORDER — POLYETHYLENE GLYCOL 3350 17 G PO PACK
17.0000 g | PACK | Freq: Every day | ORAL | Status: DC | PRN
  Administered 2016-04-15: 17 g via ORAL
  Filled 2016-04-11: qty 1

## 2016-04-11 MED ORDER — CITALOPRAM HYDROBROMIDE 20 MG PO TABS
20.0000 mg | ORAL_TABLET | Freq: Every day | ORAL | Status: DC
  Administered 2016-04-12 – 2016-04-16 (×5): 20 mg via ORAL
  Filled 2016-04-11 (×5): qty 1

## 2016-04-11 MED ORDER — DEXTROSE 5 % IV SOLN
1.0000 g | INTRAVENOUS | Status: DC
  Administered 2016-04-12 – 2016-04-15 (×4): 1 g via INTRAVENOUS
  Filled 2016-04-11 (×5): qty 10

## 2016-04-11 MED ORDER — DEXTROSE 5 % IV SOLN
INTRAVENOUS | Status: AC
  Filled 2016-04-11: qty 10

## 2016-04-11 MED ORDER — ENOXAPARIN SODIUM 30 MG/0.3ML ~~LOC~~ SOLN
30.0000 mg | SUBCUTANEOUS | Status: DC
  Administered 2016-04-12 – 2016-04-15 (×4): 30 mg via SUBCUTANEOUS
  Filled 2016-04-11 (×5): qty 0.3

## 2016-04-11 MED ORDER — TORSEMIDE 20 MG PO TABS
20.0000 mg | ORAL_TABLET | Freq: Two times a day (BID) | ORAL | Status: DC
  Administered 2016-04-12 – 2016-04-16 (×9): 20 mg via ORAL
  Filled 2016-04-11 (×9): qty 1

## 2016-04-11 NOTE — Patient Outreach (Signed)
[facility]) Care Management   04/11/2016  Melanie Lowe Apr 26, 1924 [id number]  Melanie Lowe is an 82 y.o. female  Subjective:   Patient reporting she is not feeling well, she states for a couple of days she has had some chest and back/shoulder pain. She states her chest feels heavy, she has had a cough and congestion. Patient did get z-pack and prednisone from Dr. Juanetta Gosling yesterday. Patient agrees to [facility] calling 911 and being transported to ED for evaluation.  Objective:   BP 98/62 mmHg  Pulse 52  Resp 22  Wt 114 lb (51.71 kg)  SpO2 91% Review of Systems  HENT: Positive for congestion.   Eyes: Positive for discharge.  Respiratory: Positive for cough.   Musculoskeletal: Positive for back pain.    Physical Exam  Constitutional: She is oriented to person, place, and time.  Cardiovascular: Normal rate.   Respiratory: She has rales.  Right lower lobe congestion  GI: Soft.  Musculoskeletal: Normal range of motion.  Neurological: She is alert and oriented to person, place, and time.  Skin: Skin is warm and dry.    Encounter Medications:   Outpatient Encounter Prescriptions as of 04/11/2016  Medication Sig Note  . azithromycin (ZITHROMAX Z-PAK) 250 MG tablet 2 po day one, then 1 daily x 4 days 04/11/2016: New rx 04/10/16  . predniSONE (STERAPRED UNI-PAK 21 TAB) 10 MG (21) TBPK tablet Take 10 mg by mouth daily.   Marland Kitchen acetaminophen (TYLENOL) 500 MG tablet Take 250-500 mg by mouth daily as needed for mild pain or moderate pain.    Marland Kitchen albuterol (PROVENTIL) (2.5 MG/3ML) 0.083% nebulizer solution Take 2.5 mg by nebulization 4 (four) times daily.    Marland Kitchen aspirin EC 81 MG tablet Take 81 mg by mouth daily.   . budesonide-formoterol (SYMBICORT) 160-4.5 MCG/ACT inhaler Inhale 2 puffs into the lungs 2 (two) times daily.    . citalopram (CELEXA) 20 MG tablet Take 20 mg by mouth daily.    . diazepam (VALIUM) 10 MG tablet Take 10 mg by mouth 3 (three) times daily as needed for  anxiety.   . gabapentin (NEURONTIN) 100 MG capsule Take 100 mg by mouth 5 (five) times daily.  12/16/2015: 5x a day  . oxyCODONE-acetaminophen (PERCOCET) 10-325 MG tablet Take 1 tablet by mouth 4 (four) times daily. 12/27/2015: Received from: External Pharmacy Received Sig: TK 1 T PO QID PRN  . tiotropium (SPIRIVA) 18 MCG inhalation capsule Place 18 mcg into inhaler and inhale every evening.  12/16/2015: Does not have yet  . torsemide (DEMADEX) 20 MG tablet Take 1 tablet (20 mg total) by mouth 2 (two) times daily. 03/27/2016: Education on taking the second one earlier in day    No facility-administered encounter medications on file as of 04/11/2016.    Functional Status:   In your present state of health, do you have any difficulty performing the following activities: 02/28/2016 01/25/2016  Hearing? N N  Vision? N N  Difficulty concentrating or making decisions? N N  Walking or climbing stairs? Y Y  Dressing or bathing? N N  Doing errands, shopping? [name]  Preparing Food and eating ? Y Y  Using the Toilet? N N  In the past six months, have you accidently leaked urine? N N  Do you have problems with loss of bowel control? N N  Managing your Medications? Y Y  Managing your Finances? [name]  Housekeeping or managing your Housekeeping? [name]    Fall/Depression Screening:  PHQ 2/9 Scores 12/16/2015  PHQ - 2 Score 2  PHQ- 9 Score 4    Assessment:   Chest pain: Call to 911 to transport to ED, Call to MD office, spoke with [facility] and reported patient situation. HF: weight stable COPD: patient just started z-pack and prednisone for possible bronchitis.  Plan:  [facility] CM Care Plan Problem One        Most Recent Value   Care Plan Problem One  Chest pain   Role Documenting the Problem One  Care Management Coordinator   Care Plan for Problem One  Active   THN CM Short Term Goal #1 (0-30 days)  Patient will be treated for her chest pain    THN CM Short Term Goal #1 Start Date  04/11/16    Interventions for Short Term Goal #1  Call to 911 for transport to ED. call to MD office to report patient transport to ED     Returned paperwork copies Initial plan was possible discharge, as patient transported to ED, will keep case open for possible transition of care. Patient caregiver plans to update RNCM regarding patient  Alben Spittle. [name], RN, BSN, CCM  [facility] [employer] [phone]

## 2016-04-11 NOTE — H&P (Signed)
[facility] History and Physical  Melanie Lowe IAX:[id number] DOB: Aug 10, 1924 DOA: 04/11/2016  Referring physician: Dr Lita Mains PCP: Alonza Bogus, MD   Chief Complaint: Dyspnea, cough HPI: Melanie Lowe is a 82 y.o. female with hx of tobacco use, COPD, anxiety/ depression, DJD, AAA, home O2, hx MI, Hx PNA, hx CVA who presents with SOB, cough w greenish sputum and malaise for the last few days.  Is on po abx and pred taper but not feeling any better.  Came to ED where VSS, temp 98.3,  SaO2 is 92% on 3L [place].  CXR w basilar changes poss infiltrate vs atx.  Asked to see for COPD exacerbation and probable PNA.  Getting IV abx now and hour-long neb.    Patient is a widow, had 4 biological kids and adopted two others.  Husband died 82 yrs ago.  He was a travelling Conservator, museum/gallery.  Pt's grandfather was a Retail banker.  Pt request DNR status and says her son who is here w her but not in the room is supportive of this request.  Prior tobacco use, no etoh. +cough, greensigh sputum and upper insp chest pain worse w coughing.  +DOE.    ROS  no joint pain   no HA  no blurry vision  no rash  no diarrhea  no nausea/ vomiting  no dysuria  no difficulty voiding  no change in urine color    Where does patient live home Can patient participate in ADLs? yes  Past Medical History  Past Medical History  Diagnosis Date  . COPD (chronic obstructive pulmonary disease) ([place])   . Hypertension   . Anxiety   . Depression   . Arthritis   . AAA (abdominal aortic aneurysm) ([place])   . Neuropathy ([place])   . Leg swelling   . Oxygen dependent   . Myocardial infarction ([place])   . Pneumonia   . Stroke ([place])   . CHF (congestive heart failure) ([place]) 11/09/2015   Past Surgical History  Past Surgical History  Procedure Laterality Date  . Cholecystectomy    . Appendectomy     Family History  Family History  Problem Relation Age of Onset  . Diabetes Brother   . Cancer Brother     Throat cancer, Heart  attack  . Heart disease Brother   . Heart attack Brother   . Cancer Father   . Cancer Sister    Social History  reports that she quit smoking about 5 years ago. Her smoking use included Cigarettes. She quit after 50 years of use. She quit smokeless tobacco use about 5 years ago. She reports that she does not drink alcohol or use illicit drugs. Allergies  Allergies  Allergen Reactions  . Ciprofloxacin Hcl Nausea And Vomiting  . Nsaids     Per patients son due to AAA.  Can only take coated asprin  . Other     FOOD: Spice Triggers acid reflux  . Tomato     Triggers acid reflux   Home medications Prior to Admission medications   Medication Sig Start Date End Date Taking? Authorizing Provider  acetaminophen (TYLENOL) 500 MG tablet Take 250-500 mg by mouth daily as needed for mild pain or moderate pain. Reported on 04/11/2016   Yes Historical Provider, MD  albuterol (PROVENTIL) (2.5 MG/3ML) 0.083% nebulizer solution Take 2.5 mg by nebulization 4 (four) times daily.    Yes Historical Provider, MD  aspirin EC 81 MG tablet Take 81 mg by mouth daily.  Yes Historical Provider, MD  azithromycin (ZITHROMAX Z-PAK) 250 MG tablet 2 po day one, then 1 daily x 4 days 12/28/15  Yes Daleen Bo, MD  budesonide-formoterol [facility]) 160-4.5 MCG/ACT inhaler Inhale 2 puffs into the lungs 2 (two) times daily.    Yes Historical Provider, MD  citalopram (CELEXA) 20 MG tablet Take 20 mg by mouth daily.    Yes Historical Provider, MD  diazepam (VALIUM) 10 MG tablet Take 10 mg by mouth 3 (three) times daily as needed for anxiety.   Yes Historical Provider, MD  gabapentin (NEURONTIN) 100 MG capsule Take 100 mg by mouth 5 (five) times daily.  09/06/15  Yes Historical Provider, MD  methylPREDNISolone (MEDROL DOSEPAK) 4 MG TBPK tablet Take 4-24 mg by mouth See admin instructions. 6,5,4,3,2,1 04/10/16  Yes Historical Provider, MD  oxyCODONE-acetaminophen (PERCOCET) 10-325 MG tablet Take 1 tablet by mouth 4 (four) times  daily. 12/21/15  Yes Historical Provider, MD  tiotropium (SPIRIVA) 18 MCG inhalation capsule Place 18 mcg into inhaler and inhale every evening.    Yes Historical Provider, MD  torsemide (DEMADEX) 20 MG tablet Take 1 tablet (20 mg total) by mouth 2 (two) times daily. 11/12/15  Yes Sinda Du, MD   Liver Function Tests  Recent Labs Lab 04/11/16 1518  AST 19  ALT 10*  ALKPHOS 61  BILITOT 0.5  PROT 7.0  ALBUMIN 3.5   No results for input(s): LIPASE, AMYLASE in the last 168 hours. CBC  Recent Labs Lab 04/11/16 1518  WBC 4.5  NEUTROABS 3.3  HGB 11.2*  HCT 33.4*  MCV 92.5  PLT 841   Basic Metabolic Panel  Recent Labs Lab 04/11/16 1518  NA 136  K 4.2  CL 98*  CO2 30  GLUCOSE 118*  BUN 30*  CREATININE 1.03*  CALCIUM 9.0     Filed Vitals:   04/11/16 1944 04/11/16 2000 04/11/16 2010 04/11/16 2031  BP: 126/78 123/81  121/90  Pulse: 62 61  68  Temp:      TempSrc:      Resp: 16 14  16   Height:      Weight:      SpO2: 97% 93% 95% 98%   Exam: VS: BP 126/78  HR 62 bpm  RR 16     2L 95% Gen chron ill elderly WF looks younger than stated age, no distress, on FM nebs No rash, cyanosis or gangrene Sclera anicteric, throat clear  No jvd or bruits Chest reduced R base, diffusely dec'd breath sounds, mild exp wheezing, no rales RRR distant HS cannot auscultate Abd soft ntnd no mass or ascites +bs GU deferred MS no joint effusions or deformity Ext no LE edema / no wounds or ulcers Neuro is alert, Ox 3 , nf  UA > negative, <1.005 EKG (independently reviewed) > NSR 58 bpm, poss old anterolateral MI (PRWP) CXR (independently reviewed) > COPD changes with linear scarring in mid to lower lungs bilaterally.  No acute abnormalities.  Home medications:  proventil nebs qid, symbicort 160-4.5 bid, medrol dose pak, spiriva qhs, zithromax po Celexa, valium 10 tid prn anxiety, percocet prn, tylenol prn, neurontin 100 5x/d demadex 20 mg bid   Na 136 K 4.2  CO2 30  BUN 30  Creat 1.03 eGFR 46  Glu 118  Alb 3.5  LFT's ok BNP 63  Trop 0.00/ 0.01 WBC 4k  Hb 11.2  plt 248      Assessment: 1. COPD exacerbation 2. Comm-acquired PNA 3. AAA -large w mural thrombus  in abdomen, being followed by her physicians 4. Chron resp failure on home O2 5. Anxiety/ depression 6. Hx CVA per pt 7. DNR  Plan - IV rocephin/ zmax, IVF's, IV solumedrol, duoneb + prn albuterol, O2 support.  Admit obs telemetry.    DVT Prophylaxis lovenox  Code Status: DNR  Family Communication: none here  Disposition Plan: home when better    Sol Blazing [facility] Pager [phone]  Cell [phone]  If 7PM-7AM, please contact night-coverage [email or web address] Password [facility] 04/11/2016, 8:55 PM

## 2016-04-11 NOTE — ED Provider Notes (Signed)
CSN: [id number]     Arrival date & time 04/11/16  1310 History   First MD Initiated Contact with Patient 04/11/16 1351     Chief Complaint  Patient presents with  . Chest Pain     (Consider location/radiation/quality/duration/timing/severity/associated sxs/prior Treatment) HPI Patient with history of COPD on 3 L of home O2 presents with worsening shortness of breath, cough productive of green sputum for the past few days. Also complains of upper chest and thoracic back pain. Worse with deep breathing. Seen at health nurse and recommended to come to the emergency department. EMS was called. Given 1 nitroglycerin and 324 mg of aspirin. No fever but admits to frequent chills. No new lower extremity swelling or asymmetry. Past Medical History  Diagnosis Date  . COPD (chronic obstructive pulmonary disease) ([facility])   . Hypertension   . Anxiety   . Depression   . Arthritis   . AAA (abdominal aortic aneurysm) ([facility])   . Neuropathy (H[facility])   . Leg swelling   . Oxygen dependent   . Myocardial infarction ([facility])   . Pneumonia   . Stroke (H[facility])   . CHF (congestive heart failure) ([facility]) 11/09/2015   Past Surgical History  Procedure Laterality Date  . Cholecystectomy    . Appendectomy     Family History  Problem Relation Age of Onset  . Diabetes Brother   . Cancer Brother     Throat cancer, Heart attack  . Heart disease Brother   . Heart attack Brother   . Cancer Father   . Cancer Sister    Social History  Substance Use Topics  . Smoking status: Former Smoker -- 50 years    Types: Cigarettes    Quit date: 12/31/2010  . Smokeless tobacco: Former Neurosurgeon    Quit date: 12/31/2010  . Alcohol Use: No   OB History    No data available     Review of Systems  Constitutional: Positive for chills and fatigue. Negative for fever.  Respiratory: Positive for cough and shortness of breath. Negative for wheezing.   Cardiovascular: Positive for chest pain. Negative for palpitations and leg swelling.   Gastrointestinal: Negative for nausea, vomiting and abdominal pain.  Musculoskeletal: Positive for myalgias and back pain. Negative for neck pain and neck stiffness.  Skin: Negative for rash and wound.  Neurological: Negative for dizziness, weakness, light-headedness, numbness and headaches.  All other systems reviewed and are negative.     Allergies  Ciprofloxacin hcl; Nsaids; Other; and Tomato  Home Medications   Prior to Admission medications   Medication Sig Start Date End Date Taking? Authorizing Provider  acetaminophen (TYLENOL) 500 MG tablet Take 250-500 mg by mouth daily as needed for mild pain or moderate pain. Reported on 04/11/2016   Yes Historical Provider, MD  albuterol (PROVENTIL) (2.5 MG/3ML) 0.083% nebulizer solution Take 2.5 mg by nebulization 4 (four) times daily.    Yes Historical Provider, MD  aspirin EC 81 MG tablet Take 81 mg by mouth daily.   Yes Historical Provider, MD  azithromycin (ZITHROMAX Z-PAK) 250 MG tablet 2 po day one, then 1 daily x 4 days 12/28/15  Yes Mancel Bale, MD  budesonide-formoterol [facility]) 160-4.5 MCG/ACT inhaler Inhale 2 puffs into the lungs 2 (two) times daily.    Yes Historical Provider, MD  citalopram (CELEXA) 20 MG tablet Take 20 mg by mouth daily.    Yes Historical Provider, MD  diazepam (VALIUM) 10 MG tablet Take 10 mg by mouth 3 (three) times daily as  needed for anxiety.   Yes Historical Provider, MD  gabapentin (NEURONTIN) 100 MG capsule Take 100 mg by mouth 5 (five) times daily.  09/06/15  Yes Historical Provider, MD  methylPREDNISolone (MEDROL DOSEPAK) 4 MG TBPK tablet Take 4-24 mg by mouth See admin instructions. 6,5,4,3,2,1 04/10/16  Yes Historical Provider, MD  oxyCODONE-acetaminophen (PERCOCET) 10-325 MG tablet Take 1 tablet by mouth 4 (four) times daily. 12/21/15  Yes Historical Provider, MD  tiotropium (SPIRIVA) 18 MCG inhalation capsule Place 18 mcg into inhaler and inhale every evening.    Yes Historical Provider, MD   torsemide (DEMADEX) 20 MG tablet Take 1 tablet (20 mg total) by mouth 2 (two) times daily. 11/12/15  Yes Kari Baars, MD   BP 126/78 mmHg  Pulse 62  Temp(Src) 98 F (36.7 C) (Oral)  Resp 16  Ht 5\' 1"  (1.549 m)  Wt 110 lb (49.896 kg)  BMI 20.80 kg/m2  SpO2 95% Physical Exam  Constitutional: She is oriented to person, place, and time. She appears well-developed and well-nourished. No distress.  Frail-appearing  HENT:  Head: Normocephalic and atraumatic.  Mouth/Throat: Oropharynx is clear and moist. No oropharyngeal exudate.  Eyes: EOM are normal. Pupils are equal, round, and reactive to light.  Neck: Normal range of motion. Neck supple. No JVD present.  Cardiovascular: Normal rate and regular rhythm.   Pulmonary/Chest: Effort normal. No respiratory distress. She has no wheezes. She has rales.  Fine crackles bilateral bases  Abdominal: Soft. Bowel sounds are normal. She exhibits no distension and no mass. There is no tenderness. There is no rebound and no guarding.  Musculoskeletal: Normal range of motion. She exhibits tenderness. She exhibits no edema.  Patient has tenderness to palpation over bilateral trapezius muscles. No midline cervical, thoracic or lumbar tenderness. No lower extremity asymmetry, swelling or tenderness. Distal pulses equal and intact.  Neurological: She is alert and oriented to person, place, and time.  5/5 motor in all extremities. Sensation intact.  Skin: Skin is warm and dry. No rash noted. No erythema.  Psychiatric: She has a normal mood and affect. Her behavior is normal.  Nursing note and vitals reviewed.   ED Course  Procedures (including critical care time) Labs Review Labs Reviewed  CBC WITH DIFFERENTIAL/PLATELET - Abnormal; Notable for the following:    RBC 3.61 (*)    Hemoglobin 11.2 (*)    HCT 33.4 (*)    All other components within normal limits  COMPREHENSIVE METABOLIC PANEL - Abnormal; Notable for the following:    Chloride 98 (*)     Glucose, Bld 118 (*)    BUN 30 (*)    Creatinine, Ser 1.03 (*)    ALT 10 (*)    GFR calc non Af Amer 46 (*)    GFR calc Af Amer 54 (*)    All other components within normal limits  URINALYSIS, ROUTINE W REFLEX MICROSCOPIC (NOT AT [facility]) - Abnormal; Notable for the following:    Specific Gravity, Urine <1.005 (*)    Leukocytes, UA SMALL (*)    All other components within normal limits  URINE MICROSCOPIC-ADD ON - Abnormal; Notable for the following:    Squamous Epithelial / LPF 0-5 (*)    Bacteria, UA RARE (*)    All other components within normal limits  BRAIN NATRIURETIC PEPTIDE  I-STAT TROPOININ, ED  Rosezena Sensor, ED    Imaging Review Dg Chest 2 View  04/11/2016  CLINICAL DATA:  Has not been feeling well for a few days, chest  pain and heaviness, shortness of breath, productive cough, congestion, and weakness, personal history of pneumonia, CHF, COPD, hypertension, MI, stroke EXAM: CHEST  2 VIEW COMPARISON:  12/27/2015 FINDINGS: Upper normal heart size. Calcified tortuous aorta. Mediastinal contours and pulmonary vascularity normal. Emphysematous changes with scarring at mid inferior lungs bilaterally. No acute infiltrate, pleural effusion or pneumothorax. Bones diffusely demineralized with thoracolumbar scoliosis. IMPRESSION: COPD changes with linear scarring in mid to lower lungs bilaterally. No acute abnormalities. Electronically Signed   By: Ulyses Southward M.D.   On: 04/11/2016 15:39   I have personally reviewed and evaluated these images and lab results as part of my medical decision-making.   EKG Interpretation   Date/Time:  Wednesday April 11 2016 13:15:31 EDT Ventricular Rate:  58 PR Interval:  221 QRS Duration: 93 QT Interval:  446 QTC Calculation: 438 R Axis:   48 Text Interpretation:  Sinus rhythm Prolonged PR interval Probable  anteroseptal infarct, old No significant change since last tracing  Confirmed by ZACKOWSKI  MD, SCOTT [phone]) on 04/11/2016 1:19:36 PM       MDM   Final diagnoses:  COPD exacerbation (HCC)  Atypical chest pain   Patient with improved air movement. States she is feeling mildly better. Given severity of COPD we'll go ahead and start on antibiotics. Symptoms likely related to COPD exacerbation. Was discussed with hospitalist regarding observation admit for tonight.     Loren Racer, MD 04/11/16 2030

## 2016-04-11 NOTE — ED Notes (Signed)
Pt was given one NTG and 324 mg of ASA by EMS prior to arrival

## 2016-04-11 NOTE — ED Notes (Signed)
Pt complain of chest and lower back pain. States she thinks she may have pneumonia

## 2016-04-12 DIAGNOSIS — J9611 Chronic respiratory failure with hypoxia: Secondary | ICD-10-CM

## 2016-04-12 DIAGNOSIS — F419 Anxiety disorder, unspecified: Secondary | ICD-10-CM

## 2016-04-12 LAB — CBC
HCT: 33.2 % — ABNORMAL LOW (ref 36.0–46.0)
Hemoglobin: 11 g/dL — ABNORMAL LOW (ref 12.0–15.0)
MCH: 30.6 pg (ref 26.0–34.0)
MCHC: 33.1 g/dL (ref 30.0–36.0)
MCV: 92.2 fL (ref 78.0–100.0)
PLATELETS: 242 10*3/uL (ref 150–400)
RBC: 3.6 MIL/uL — ABNORMAL LOW (ref 3.87–5.11)
RDW: 13.5 % (ref 11.5–15.5)
WBC: 3.5 10*3/uL — AB (ref 4.0–10.5)

## 2016-04-12 LAB — BASIC METABOLIC PANEL
ANION GAP: 10 (ref 5–15)
BUN: 27 mg/dL — ABNORMAL HIGH (ref 6–20)
CALCIUM: 9.4 mg/dL (ref 8.9–10.3)
CO2: 27 mmol/L (ref 22–32)
Chloride: 101 mmol/L (ref 101–111)
Creatinine, Ser: 0.86 mg/dL (ref 0.44–1.00)
GFR, EST NON AFRICAN AMERICAN: 58 mL/min — AB (ref >60)
Glucose, Bld: 201 mg/dL — ABNORMAL HIGH (ref 65–99)
Potassium: 3.9 mmol/L (ref 3.5–5.1)
Sodium: 138 mmol/L (ref 135–145)

## 2016-04-12 MED ORDER — IPRATROPIUM-ALBUTEROL 0.5-2.5 (3) MG/3ML IN SOLN
3.0000 mL | Freq: Two times a day (BID) | RESPIRATORY_TRACT | Status: DC
  Administered 2016-04-13 (×2): 3 mL via RESPIRATORY_TRACT
  Filled 2016-04-12 (×2): qty 3

## 2016-04-12 MED ORDER — ENSURE ENLIVE PO LIQD
237.0000 mL | Freq: Two times a day (BID) | ORAL | Status: DC
  Administered 2016-04-12 – 2016-04-16 (×8): 237 mL via ORAL

## 2016-04-12 NOTE — Progress Notes (Signed)
[facility] PROGRESS NOTE  Melanie Lowe ZOX:[id number] DOB: September 03, 1924    PCP:   Melanie Maudlin, MD   HPI:  Melanie Lowe is a 82 y.o. female with hx of tobacco use, COPD, anxiety/ depression, DJD, AAA, home O2, hx MI, Hx PNA, hx CVA who presents with SOB, cough w greenish sputum and malaise for the last few days. Is on po abx and pred taper but not feeling any better. Came to ED where VSS, temp 98.3, SaO2 is 92% on 3L [place]. CXR w basilar changes poss infiltrate vs atx. She was admitted and given IV Rocephin and Zithromax, IV Steroids, nebs. She is feeling better.   Rewiew of Systems:  Constitutional: Negative for malaise, fever and chills. No significant weight loss or weight gain Eyes: Negative for eye pain, redness and discharge, diplopia, visual changes, or flashes of light. ENMT: Negative for ear pain, hoarseness, nasal congestion, sinus pressure and sore throat. No headaches; tinnitus, drooling, or problem swallowing. Cardiovascular: Negative for chest pain, palpitations, diaphoresis, dyspnea and peripheral edema. ; No orthopnea, PND Respiratory: Negative for hemoptysis, wheezing and stridor. No pleuritic chestpain. Gastrointestinal: Negative for nausea, vomiting, diarrhea, constipation, abdominal pain, melena, blood in stool, hematemesis, jaundice and rectal bleeding.    Genitourinary: Negative for frequency, dysuria, incontinence,flank pain and hematuria; Musculoskeletal: Negative for back pain and neck pain. Negative for swelling and trauma.;  Skin: . Negative for pruritus, rash, abrasions, bruising and skin lesion.; ulcerations Neuro: Negative for headache, lightheadedness and neck stiffness. Negative for weakness, altered level of consciousness , altered mental status, extremity weakness, burning feet, involuntary movement, seizure and syncope.  Psych: negative for anxiety, depression, insomnia, tearfulness, panic attacks, hallucinations, paranoia, suicidal or homicidal  ideation    Past Medical History  Diagnosis Date  . COPD (chronic obstructive pulmonary disease) ([facility])   . Hypertension   . Anxiety   . Depression   . Arthritis   . AAA (abdominal aortic aneurysm) (H[facility])   . Neuropathy (HCC)   . Leg swelling   . Oxygen dependent   . Myocardial infarction (HCC)   . Pneumonia   . Stroke (HCC)   . CHF (congestive heart failure) (H[facility]) 11/09/2015    Past Surgical History  Procedure Laterality Date  . Cholecystectomy    . Appendectomy      Medications:  HOME MEDS: Prior to Admission medications   Medication Sig Start Date End Date Taking? Authorizing Provider  acetaminophen (TYLENOL) 500 MG tablet Take 250-500 mg by mouth daily as needed for mild pain or moderate pain. Reported on 04/11/2016   Yes Historical Provider, MD  albuterol (PROVENTIL) (2.5 MG/3ML) 0.083% nebulizer solution Take 2.5 mg by nebulization 4 (four) times daily.    Yes Historical Provider, MD  aspirin EC 81 MG tablet Take 81 mg by mouth daily.   Yes Historical Provider, MD  azithromycin (ZITHROMAX Z-PAK) 250 MG tablet 2 po day one, then 1 daily x 4 days 12/28/15  Yes Mancel Bale, MD  budesonide-formoterol [facility]) 160-4.5 MCG/ACT inhaler Inhale 2 puffs into the lungs 2 (two) times daily.    Yes Historical Provider, MD  citalopram (CELEXA) 20 MG tablet Take 20 mg by mouth daily.    Yes Historical Provider, MD  diazepam (VALIUM) 10 MG tablet Take 10 mg by mouth 3 (three) times daily as needed for anxiety.   Yes Historical Provider, MD  gabapentin (NEURONTIN) 100 MG capsule Take 100 mg by mouth 5 (five) times daily.  09/06/15  Yes Historical Provider, MD  methylPREDNISolone (MEDROL DOSEPAK) 4 MG TBPK tablet Take 4-24 mg by mouth See admin instructions. 6,5,4,3,2,1 04/10/16  Yes Historical Provider, MD  oxyCODONE-acetaminophen (PERCOCET) 10-325 MG tablet Take 1 tablet by mouth 4 (four) times daily. 12/21/15  Yes Historical Provider, MD  tiotropium (SPIRIVA) 18 MCG inhalation capsule  Place 18 mcg into inhaler and inhale every evening.    Yes Historical Provider, MD  torsemide (DEMADEX) 20 MG tablet Take 1 tablet (20 mg total) by mouth 2 (two) times daily. 11/12/15  Yes Kari Baars, MD     Allergies:  Allergies  Allergen Reactions  . Ciprofloxacin Hcl Nausea And Vomiting  . Nsaids     Per patients son due to AAA.  Can only take coated asprin  . Other     FOOD: Spice Triggers acid reflux  . Tomato     Triggers acid reflux    Social History:   reports that she quit smoking about 5 years ago. Her smoking use included Cigarettes. She quit after 50 years of use. She quit smokeless tobacco use about 5 years ago. She reports that she does not drink alcohol or use illicit drugs.  Family History: Family History  Problem Relation Age of Onset  . Diabetes Brother   . Cancer Brother     Throat cancer, Heart attack  . Heart disease Brother   . Heart attack Brother   . Cancer Father   . Cancer Sister      Physical Exam: Filed Vitals:   04/11/16 2200 04/11/16 2254 04/12/16 0620 04/12/16 0802  BP: 109/75 118/73 136/90   Pulse: 90 90 70   Temp:   97.8 F (36.6 C)   TempSrc:   Oral   Resp: 16 16 20    Height:  5\' 1"  (1.549 m)    Weight:  54.341 kg (119 lb 12.8 oz)    SpO2: 95% 97% 98% 99%   Blood pressure 136/90, pulse 70, temperature 97.8 F (36.6 C), temperature source Oral, resp. rate 20, height 5\' 1"  (1.549 m), weight 54.341 kg (119 lb 12.8 oz), SpO2 99 %.  GEN:  Pleasant  patient lying in the stretcher in no acute distress; cooperative with exam. PSYCH:  alert and oriented x4; does not appear anxious or depressed; affect is appropriate. HEENT: Mucous membranes pink and anicteric; PERRLA; EOM intact; no cervical lymphadenopathy nor thyromegaly or carotid bruit; no JVD; There were no stridor. Neck is very supple. Breasts:: Not examined CHEST WALL: No tenderness CHEST: Normal respiration, clear to auscultation bilaterally.  HEART: Regular rate and rhythm.   There are no murmur, rub, or gallops.   BACK: No kyphosis or scoliosis; no CVA tenderness ABDOMEN: soft and non-tender; no masses, no organomegaly, normal abdominal bowel sounds; no pannus; no intertriginous candida. There is no rebound and no distention. Rectal Exam: Not done EXTREMITIES: No bone or joint deformity; age-appropriate arthropathy of the hands and knees; no edema; no ulcerations.  There is no calf tenderness. Genitalia: not examined PULSES: 2+ and symmetric SKIN: Normal hydration no rash or ulceration CNS: Cranial nerves 2-12 grossly intact no focal lateralizing neurologic deficit.  Speech is fluent; uvula elevated with phonation, facial symmetry and tongue midline. DTR are normal bilaterally, cerebella exam is intact, barbinski is negative and strengths are equaled bilaterally.  No sensory loss.   Labs on Admission:  Basic Metabolic Panel:  Recent Labs Lab 04/11/16 1518 04/12/16 0610  NA 136 138  K 4.2 3.9  CL 98* 101  CO2 30 27  GLUCOSE  118* 201*  BUN 30* 27*  CREATININE 1.03* 0.86  CALCIUM 9.0 9.4   Liver Function Tests:  Recent Labs Lab 04/11/16 1518  AST 19  ALT 10*  ALKPHOS 61  BILITOT 0.5  PROT 7.0  ALBUMIN 3.5   CBC:  Recent Labs Lab 04/11/16 1518 04/12/16 0610  WBC 4.5 3.5*  NEUTROABS 3.3  --   HGB 11.2* 11.0*  HCT 33.4* 33.2*  MCV 92.5 92.2  PLT 248 242    Radiological Exams on Admission: Dg Chest 2 View  04/11/2016  CLINICAL DATA:  Has not been feeling well for a few days, chest pain and heaviness, shortness of breath, productive cough, congestion, and weakness, personal history of pneumonia, CHF, COPD, hypertension, MI, stroke EXAM: CHEST  2 VIEW COMPARISON:  12/27/2015 FINDINGS: Upper normal heart size. Calcified tortuous aorta. Mediastinal contours and pulmonary vascularity normal. Emphysematous changes with scarring at mid inferior lungs bilaterally. No acute infiltrate, pleural effusion or pneumothorax. Bones diffusely demineralized  with thoracolumbar scoliosis. IMPRESSION: COPD changes with linear scarring in mid to lower lungs bilaterally. No acute abnormalities. Electronically Signed   By: Ulyses Southward M.D.   On: 04/11/2016 15:39    EKG: Independently reviewed.   Assessment/Plan Present on Admission:  . COPD exacerbation (HCC) . SOB (shortness of breath) . Pneumonia . DNR (do not resuscitate) . (Resolved) CHRONIC OBSTRUCTIVE PULMONARY DISEASE, ACUTE EXACERBATION . Chronic respiratory failure with hypoxia (HCC) . Abdominal aortic aneurysm (HCC) . Acute on chronic respiratory failure (HCC) . Anxiety . PNA (pneumonia)  PLAN:  COPD exacerbation:  Doing a little better, but is not at baseline as yet.  Will continue with IV antibiotics, IV Steroids, and nebs.  She has been on home oxygen.  Dr Juanetta Gosling will see her tomorrow.  Other problems stable.     Other plans as per orders. Code Status: DNR.   Houston Siren, MD.  FACP [facility] Pager [phone] 7pm to 7am.  04/12/2016, 10:43 AM

## 2016-04-12 NOTE — Progress Notes (Signed)
Initial Nutrition Assessment  DOCUMENTATION CODES:  Non-severe (moderate) malnutrition in context of chronic illness   Pt meets criteria for MODERATE MALNUTRITION in the context of Chronic Illness as evidenced by loss of >10% bw in 6 months, moderate fat loss, mild muscle loss.  INTERVENTION:  Ensure Enlive po BID, each supplement provides 350 kcal and 20 grams of protein  NUTRITION DIAGNOSIS:  Increased nutrient needs related to chronic illness (COPD) as evidenced by mild muscle wasting, moderate fat loss, and loss of >10% bw in 6 months  GOAL:  Patient will meet greater than or equal to 90% of their needs  MONITOR:  PO intake, Supplement acceptance, Labs  REASON FOR ASSESSMENT:  Malnutrition Screening Tool    ASSESSMENT:  82 y/o female PMHx copd, anxiety, depression, DJD, AAA, presents w/ SOB, cough w/ green sputum and malaise for last few days. Admitted and worked up for COPD exacerbation with underlying CAP.  Pt is very pleasant, but extremely conversational and difficult to obtain much history from. It sounds that at home, she is dependant on other caregivers to provide her meals, however she only is prepared a breakfast and a dinner. Typically she goes a long time without eating and she is only able to make herself some sandwiches.   RD recommended meal replacements in the form of protein drinks to ensure she receives adequate protein and calories. She has not consumed Ensure/Boost before. She notes some financial difficulty and has many dependants. Advised her that she does not need to specifically consume Ensure, she could choose a generic supplement. Also gave some examples of high kcal/protein foods, one of which she is already consuming in PB.   Pt reports that she DOES have a good appetite. Food accessibility appears to be the limiting factor. She is well known to dietary staff who report that frequently she requests 2x portions.   She state her UBW is 146 lbs and she last  weighed this amount 6 months ago. However, EMR documentation shows weight as low as 114 in 2015. Likely she has consistently reported that she weighs 146 lbs. That said, she does exhibit a wt loss over the last 6 months. She has lost at least 17 lbs in the last 5 months. This is clinically significant  NFPE: Pt exhibits moderate fat loss and mild muscle wasting. No edema noted.    Labs reviewed: Slightly low h/h. Hyperglycemic   Recent Labs Lab 04/11/16 1518 04/12/16 0610  NA 136 138  K 4.2 3.9  CL 98* 101  CO2 30 27  BUN 30* 27*  CREATININE 1.03* 0.86  CALCIUM 9.0 9.4  GLUCOSE 118* 201*   Diet Order:  Diet regular Room service appropriate?: Yes; Fluid consistency:: Thin  Skin:  Reviewed, no issues  Last BM:  4/12  Height:  Ht Readings from Last 1 Encounters:  04/11/16  (1.549 m)   Weight:  Wt Readings from Last 1 Encounters:  04/11/16 119 lb 12.8 oz (54.341 kg)   Wt Readings from Last 10 Encounters:  04/11/16 119 lb 12.8 oz (54.341 kg)  04/11/16 114 lb (51.71 kg)  03/27/16 114 lb (51.71 kg)  02/28/16 116 lb (52.617 kg)  01/25/16 126 lb (57.153 kg)  12/27/15 128 lb (58.06 kg)  12/16/15 120 lb (54.432 kg)  12/12/15 120 lb (54.432 kg)  11/29/15 121 lb (54.885 kg)  11/12/15 131 lb 1.6 oz (59.467 kg)  Admitted at 110 vs 114 lbs  Ideal Body Weight:  47.73 kg  BMI:  Body mass index is 22.65 kg/(m^2).  Estimated Nutritional Needs:  Kcal:  1500-1700 (30-34 kcal/kg bw) Protein:  60-70 g (1.2-1.4 g/kg bw) Fluid:  1.5-1.7 liters fluid  EDUCATION NEEDS:  No education needs identified at this time  Christophe Louis RD, LDN Clinical Nutrition Pager: [phone] 04/12/2016 1:20 PM

## 2016-04-12 NOTE — Consult Note (Signed)
   [facility] CM Inpatient Consult   04/12/2016  Melanie Lowe 1924/02/14 [id number]  Patient is currently active with [facility] Care Management for chronic disease management services.  Patient has been engaged by a [employer].  Our community based plan of care has focused on disease management and community resource support.  Patient will receive a post discharge transition of care call and will be evaluated for monthly home visits for assessments and disease process education.  Made Inpatient Case Manager aware that [facility] Care Management following.  Of note, [facility] Care Management services does not replace or interfere with any services that are arranged by inpatient case management or social work.   For additional questions or referrals please contact:   Alben Spittle. [name], RN, BSN, [facility] [phone]) Business Cell  [phone]) Toll Free Office

## 2016-04-13 DIAGNOSIS — E440 Moderate protein-calorie malnutrition: Secondary | ICD-10-CM | POA: Insufficient documentation

## 2016-04-13 MED ORDER — TIOTROPIUM BROMIDE MONOHYDRATE 18 MCG IN CAPS
18.0000 ug | ORAL_CAPSULE | Freq: Every day | RESPIRATORY_TRACT | Status: DC
  Administered 2016-04-15: 18 ug via RESPIRATORY_TRACT
  Filled 2016-04-13: qty 5

## 2016-04-13 MED ORDER — ALBUTEROL SULFATE (2.5 MG/3ML) 0.083% IN NEBU
2.5000 mg | INHALATION_SOLUTION | Freq: Two times a day (BID) | RESPIRATORY_TRACT | Status: DC
  Administered 2016-04-14 – 2016-04-15 (×4): 2.5 mg via RESPIRATORY_TRACT
  Filled 2016-04-13 (×5): qty 3

## 2016-04-13 NOTE — Care Management Important Message (Signed)
Important Message  Patient Details  Name: Melanie Lowe MRN: [id number] Date of Birth: 06/28/1924   Medicare Important Message Given:  Yes    Adonis Huguenin, RN 04/13/2016, 11:31 AM

## 2016-04-13 NOTE — Progress Notes (Signed)
Subjective: Patient was addmitted yesterday due pneumonia and COPD exacerbation. She is on IV steroid, IV antibiotics and nebulizer treatment. Patient feels better.  Objective: Vital signs in last 24 hours: Temp:  [97.5 F (36.4 C)-97.9 F (36.6 C)] 97.5 F (36.4 C) (04/14 0555) Pulse Rate:  [48-87] 48 (04/14 0555) Resp:  [20] 20 (04/14 0555) BP: (101-124)/(64-81) 124/81 mmHg (04/14 0555) SpO2:  [94 %-96 %] 96 % (04/14 0555) Weight change:  Last BM Date: 04/11/16  Intake/Output from previous day: 04/13 0701 - 04/14 0700 In: 895 [P.O.:720; IV Piggyback:175] Out: 2600 [Urine:2600]  PHYSICAL EXAM General appearance: alert and mild distress Resp: diminished breath sounds bilaterally and rhonchi bilaterally Cardio: S1, S2 normal GI: soft, non-tender; bowel sounds normal; no masses,  no organomegaly Extremities: extremities normal, atraumatic, no cyanosis or edema  Lab Results:  Results for orders placed or performed during the hospital encounter of 04/11/16 (from the past 48 hour(s))  CBC with Differential/Platelet     Status: Abnormal   Collection Time: 04/11/16  3:18 PM  Result Value Ref Range   WBC 4.5 4.0 - 10.5 K/uL   RBC 3.61 (L) 3.87 - 5.11 MIL/uL   Hemoglobin 11.2 (L) 12.0 - 15.0 g/dL   HCT 33.4 (L) 36.0 - 46.0 %   MCV 92.5 78.0 - 100.0 fL   MCH 31.0 26.0 - 34.0 pg   MCHC 33.5 30.0 - 36.0 g/dL   RDW 13.7 11.5 - 15.5 %   Platelets 248 150 - 400 K/uL   Neutrophils Relative % 74 %   Neutro Abs 3.3 1.7 - 7.7 K/uL   Lymphocytes Relative 19 %   Lymphs Abs 0.8 0.7 - 4.0 K/uL   Monocytes Relative 6 %   Monocytes Absolute 0.3 0.1 - 1.0 K/uL   Eosinophils Relative 1 %   Eosinophils Absolute 0.0 0.0 - 0.7 K/uL   Basophils Relative 0 %   Basophils Absolute 0.0 0.0 - 0.1 K/uL  Comprehensive metabolic panel     Status: Abnormal   Collection Time: 04/11/16  3:18 PM  Result Value Ref Range   Sodium 136 135 - 145 mmol/L   Potassium 4.2 3.5 - 5.1 mmol/L   Chloride 98 (L)  101 - 111 mmol/L   CO2 30 22 - 32 mmol/L   Glucose, Bld 118 (H) 65 - 99 mg/dL   BUN 30 (H) 6 - 20 mg/dL   Creatinine, Ser 1.03 (H) 0.44 - 1.00 mg/dL   Calcium 9.0 8.9 - 10.3 mg/dL   Total Protein 7.0 6.5 - 8.1 g/dL   Albumin 3.5 3.5 - 5.0 g/dL   AST 19 15 - 41 U/L   ALT 10 (L) 14 - 54 U/L   Alkaline Phosphatase 61 38 - 126 U/L   Total Bilirubin 0.5 0.3 - 1.2 mg/dL   GFR calc non Af Amer 46 (L) >60 mL/min   GFR calc Af Amer 54 (L) >60 mL/min    Comment: (NOTE) The eGFR has been calculated using the CKD EPI equation. This calculation has not been validated in all clinical situations. eGFR's persistently <60 mL/min signify possible Chronic Kidney Disease.    Anion gap 8 5 - 15  Brain natriuretic peptide     Status: None   Collection Time: 04/11/16  3:18 PM  Result Value Ref Range   B Natriuretic Peptide 63.0 0.0 - 100.0 pg/mL  I-stat troponin, ED     Status: None   Collection Time: 04/11/16  3:39 PM  Result Value Ref Range  Troponin i, poc 0.01 0.00 - 0.08 ng/mL   Comment 3            Comment: Due to the release kinetics of cTnI, a negative result within the first hours of the onset of symptoms does not rule out myocardial infarction with certainty. If myocardial infarction is still suspected, repeat the test at appropriate intervals.   Urinalysis, Routine w reflex microscopic (not at [facility])     Status: Abnormal   Collection Time: 04/11/16  4:56 PM  Result Value Ref Range   Color, Urine YELLOW YELLOW   APPearance CLEAR CLEAR   Specific Gravity, Urine <1.005 (L) 1.005 - 1.030   pH 5.5 5.0 - 8.0   Glucose, UA NEGATIVE NEGATIVE mg/dL   Hgb urine dipstick NEGATIVE NEGATIVE   Bilirubin Urine NEGATIVE NEGATIVE   Ketones, ur NEGATIVE NEGATIVE mg/dL   Protein, ur NEGATIVE NEGATIVE mg/dL   Nitrite NEGATIVE NEGATIVE   Leukocytes, UA SMALL (A) NEGATIVE  Urine microscopic-add on     Status: Abnormal   Collection Time: 04/11/16  4:56 PM  Result Value Ref Range   Squamous  Epithelial / LPF 0-5 (A) NONE SEEN   WBC, UA 0-5 0 - 5 WBC/hpf   RBC / HPF NONE SEEN 0 - 5 RBC/hpf   Bacteria, UA RARE (A) NONE SEEN  I-stat troponin, ED     Status: None   Collection Time: 04/11/16  6:24 PM  Result Value Ref Range   Troponin i, poc 0.00 0.00 - 0.08 ng/mL   Comment 3            Comment: Due to the release kinetics of cTnI, a negative result within the first hours of the onset of symptoms does not rule out myocardial infarction with certainty. If myocardial infarction is still suspected, repeat the test at appropriate intervals.   CBC     Status: Abnormal   Collection Time: 04/12/16  6:10 AM  Result Value Ref Range   WBC 3.5 (L) 4.0 - 10.5 K/uL   RBC 3.60 (L) 3.87 - 5.11 MIL/uL   Hemoglobin 11.0 (L) 12.0 - 15.0 g/dL   HCT 33.2 (L) 36.0 - 46.0 %   MCV 92.2 78.0 - 100.0 fL   MCH 30.6 26.0 - 34.0 pg   MCHC 33.1 30.0 - 36.0 g/dL   RDW 13.5 11.5 - 15.5 %   Platelets 242 150 - 400 K/uL  Basic metabolic panel     Status: Abnormal   Collection Time: 04/12/16  6:10 AM  Result Value Ref Range   Sodium 138 135 - 145 mmol/L   Potassium 3.9 3.5 - 5.1 mmol/L   Chloride 101 101 - 111 mmol/L   CO2 27 22 - 32 mmol/L   Glucose, Bld 201 (H) 65 - 99 mg/dL   BUN 27 (H) 6 - 20 mg/dL   Creatinine, Ser 0.86 0.44 - 1.00 mg/dL   Calcium 9.4 8.9 - 10.3 mg/dL   GFR calc non Af Amer 58 (L) >60 mL/min   GFR calc Af Amer >60 >60 mL/min    Comment: (NOTE) The eGFR has been calculated using the CKD EPI equation. This calculation has not been validated in all clinical situations. eGFR's persistently <60 mL/min signify possible Chronic Kidney Disease.    Anion gap 10 5 - 15    ABGS No results for input(s): PHART, PO2ART, TCO2, HCO3 in the last 72 hours.  Invalid input(s): PCO2 CULTURES No results found for this or any previous visit (from  the past 240 hour(s)). Studies/Results: Dg Chest 2 View  04/11/2016  CLINICAL DATA:  Has not been feeling well for a few days, chest pain and  heaviness, shortness of breath, productive cough, congestion, and weakness, personal history of pneumonia, CHF, COPD, hypertension, MI, stroke EXAM: CHEST  2 VIEW COMPARISON:  12/27/2015 FINDINGS: Upper normal heart size. Calcified tortuous aorta. Mediastinal contours and pulmonary vascularity normal. Emphysematous changes with scarring at mid inferior lungs bilaterally. No acute infiltrate, pleural effusion or pneumothorax. Bones diffusely demineralized with thoracolumbar scoliosis. IMPRESSION: COPD changes with linear scarring in mid to lower lungs bilaterally. No acute abnormalities. Electronically Signed   By: Lavonia Dana M.D.   On: 04/11/2016 15:39    Medications: I have reviewed the patient's current medications.  Assesment:   Principal Problem:   Pneumonia Active Problems:   Abdominal aortic aneurysm (HCC)   SOB (shortness of breath)   Chronic respiratory failure with hypoxia (HCC)   DNR (do not resuscitate)   Acute on chronic respiratory failure (HCC)   COPD exacerbation (HCC)   Anxiety   PNA (pneumonia)   Malnutrition of moderate degree    Plan:  Medications reviewed Continue IV antibiotics Continue nebulizer treatment Continue IV steroids Continue supportive care.    LOS: 2 days   Nyeli Holtmeyer 04/13/2016, 8:19 AM

## 2016-04-14 NOTE — Progress Notes (Signed)
Subjective: Patient feels slightly better. Her breathing is improving. No fever or chills. She is receiving iv antibiotics and nebulizer treatment..  Objective: Vital signs in last 24 hours: Temp:  [97.5 F (36.4 C)-97.8 F (36.6 C)] 97.5 F (36.4 C) (04/15 0627) Pulse Rate:  [56-84] 56 (04/15 0627) Resp:  [16-20] 20 (04/15 0627) BP: (106-129)/(59-76) 129/76 mmHg (04/15 0627) SpO2:  [92 %-100 %] 96 % (04/15 0730) Weight change:  Last BM Date: 04/11/16  Intake/Output from previous day: 04/14 0701 - 04/15 0700 In: 1206 [P.O.:1200; I.V.:6] Out: 300 [Urine:300]  PHYSICAL EXAM General appearance: alert and mild distress Resp: diminished breath sounds bilaterally and rhonchi bilaterally Cardio: S1, S2 normal GI: soft, non-tender; bowel sounds normal; no masses,  no organomegaly Extremities: extremities normal, atraumatic, no cyanosis or edema  Lab Results:  No results found for this or any previous visit (from the past 48 hour(s)).  ABGS No results for input(s): PHART, PO2ART, TCO2, HCO3 in the last 72 hours.  Invalid input(s): PCO2 CULTURES No results found for this or any previous visit (from the past 240 hour(s)). Studies/Results: No results found.  Medications: I have reviewed the patient's current medications.  Assesment:   Principal Problem:   Pneumonia Active Problems:   Abdominal aortic aneurysm (HCC)   SOB (shortness of breath)   Chronic respiratory failure with hypoxia (HCC)   DNR (do not resuscitate)   Acute on chronic respiratory failure (HCC)   COPD exacerbation (HCC)   Anxiety   PNA (pneumonia)   Malnutrition of moderate degree    Plan:  Medications reviewed Continue IV antibiotics Continue nebulizer treatment Continue IV steroids Continue supportive care.    LOS: 3 days   Bexley Mclester 04/14/2016, 8:13 AM

## 2016-04-15 MED ORDER — METHYLPREDNISOLONE SODIUM SUCC 40 MG IJ SOLR
40.0000 mg | Freq: Every day | INTRAMUSCULAR | Status: DC
  Administered 2016-04-16: 40 mg via INTRAVENOUS
  Filled 2016-04-15: qty 1

## 2016-04-15 NOTE — Progress Notes (Signed)
Subjective: Patient is improving. Her breathing is better. No fever or chills.t..  Objective: Vital signs in last 24 hours: Temp:  [97.5 F (36.4 C)-97.8 F (36.6 C)] 97.7 F (36.5 C) (04/16 0514) Pulse Rate:  [58-88] 58 (04/16 0514) Resp:  [14-20] 20 (04/16 0514) BP: (124-127)/(72-85) 124/72 mmHg (04/16 0514) SpO2:  [91 %-99 %] 97 % (04/16 0726) Weight change:  Last BM Date: 04/11/16  Intake/Output from previous day: 04/15 0701 - 04/16 0700 In: 840 [P.O.:840] Out: 900 [Urine:900]  PHYSICAL EXAM General appearance: alert and mild distress Resp: diminished breath sounds bilaterally and rhonchi bilaterally Cardio: S1, S2 normal GI: soft, non-tender; bowel sounds normal; no masses,  no organomegaly Extremities: extremities normal, atraumatic, no cyanosis or edema  Lab Results:  No results found for this or any previous visit (from the past 48 hour(s)).  ABGS No results for input(s): PHART, PO2ART, TCO2, HCO3 in the last 72 hours.  Invalid input(s): PCO2 CULTURES No results found for this or any previous visit (from the past 240 hour(s)). Studies/Results: No results found.  Medications: I have reviewed the patient's current medications.  Assesment:   Principal Problem:   Pneumonia Active Problems:   Abdominal aortic aneurysm (HCC)   SOB (shortness of breath)   Chronic respiratory failure with hypoxia (HCC)   DNR (do not resuscitate)   Acute on chronic respiratory failure (HCC)   COPD exacerbation (HCC)   Anxiety   PNA (pneumonia)   Malnutrition of moderate degree    Plan:  Medications reviewed Continue IV antibiotics Continue nebulizer treatment Will taper IV antibiotics Continue supportive care.    LOS: 4 days   Trayton Szabo 04/15/2016, 10:27 AM

## 2016-04-16 LAB — BASIC METABOLIC PANEL
Anion gap: 8 (ref 5–15)
BUN: 48 mg/dL — ABNORMAL HIGH (ref 6–20)
CALCIUM: 8.9 mg/dL (ref 8.9–10.3)
CO2: 37 mmol/L — ABNORMAL HIGH (ref 22–32)
CREATININE: 1.15 mg/dL — AB (ref 0.44–1.00)
Chloride: 96 mmol/L — ABNORMAL LOW (ref 101–111)
GFR calc Af Amer: 47 mL/min — ABNORMAL LOW (ref >60)
GFR calc non Af Amer: 41 mL/min — ABNORMAL LOW (ref >60)
Glucose, Bld: 99 mg/dL (ref 65–99)
POTASSIUM: 3.9 mmol/L (ref 3.5–5.1)
SODIUM: 141 mmol/L (ref 135–145)

## 2016-04-16 LAB — CBC
HCT: 35 % — ABNORMAL LOW (ref 36.0–46.0)
HEMOGLOBIN: 11.6 g/dL — AB (ref 12.0–15.0)
MCH: 31.4 pg (ref 26.0–34.0)
MCHC: 33.1 g/dL (ref 30.0–36.0)
MCV: 94.9 fL (ref 78.0–100.0)
PLATELETS: 272 10*3/uL (ref 150–400)
RBC: 3.69 MIL/uL — AB (ref 3.87–5.11)
RDW: 14 % (ref 11.5–15.5)
WBC: 8.7 10*3/uL (ref 4.0–10.5)

## 2016-04-16 MED ORDER — AMOXICILLIN-POT CLAVULANATE 500-125 MG PO TABS: 1.0000 | ORAL_TABLET | Freq: Three times a day (TID) | ORAL | Status: DC

## 2016-04-16 MED ORDER — PREDNISONE 10 MG (21) PO TBPK: 10.0000 mg | ORAL_TABLET | Freq: Every day | ORAL | Status: DC

## 2016-04-16 NOTE — Care Management Important Message (Signed)
Important Message  Patient Details  Name: Melanie Lowe MRN: [id number] Date of Birth: 05-18-1924   Medicare Important Message Given:  Yes    Adonis Huguenin, RN 04/16/2016, 10:29 AM

## 2016-04-16 NOTE — Progress Notes (Signed)
Discharge instructions and prescriptions given, verbalized understanding, out in stable condition via w/c with staff. 

## 2016-04-16 NOTE — Discharge Summary (Signed)
Physician Discharge Summary  Patient ID: Melanie Lowe MRN: [id number] DOB/AGE: 1924-09-20 82 y.o. Primary Care Physician:HAWKINS,EDWARD L, MD Admit date: 04/11/2016 Discharge date: 04/16/2016    Discharge Diagnoses:   Principal Problem:   Pneumonia Active Problems:   Abdominal aortic aneurysm (HCC)   SOB (shortness of breath)   Chronic respiratory failure with hypoxia (HCC)   DNR (do not resuscitate)   Acute on chronic respiratory failure (HCC)   COPD exacerbation (HCC)   Anxiety   PNA (pneumonia)   Malnutrition of moderate degree     Medication List    STOP taking these medications        azithromycin 250 MG tablet  Commonly known as:  ZITHROMAX Z-PAK     methylPREDNISolone 4 MG Tbpk tablet  Commonly known as:  MEDROL DOSEPAK      TAKE these medications        acetaminophen 500 MG tablet  Commonly known as:  TYLENOL  Take 250-500 mg by mouth daily as needed for mild pain or moderate pain. Reported on 04/11/2016     albuterol (2.5 MG/3ML) 0.083% nebulizer solution  Commonly known as:  PROVENTIL  Take 2.5 mg by nebulization 4 (four) times daily.     amoxicillin-clavulanate 500-125 MG tablet  Commonly known as:  AUGMENTIN  Take 1 tablet (500 mg total) by mouth 3 (three) times daily.     aspirin EC 81 MG tablet  Take 81 mg by mouth daily.     budesonide-formoterol 160-4.5 MCG/ACT inhaler  Commonly known as:  SYMBICORT  Inhale 2 puffs into the lungs 2 (two) times daily.     citalopram 20 MG tablet  Commonly known as:  CELEXA  Take 20 mg by mouth daily.     diazepam 10 MG tablet  Commonly known as:  VALIUM  Take 10 mg by mouth 3 (three) times daily as needed for anxiety.     gabapentin 100 MG capsule  Commonly known as:  NEURONTIN  Take 100 mg by mouth 5 (five) times daily.     oxyCODONE-acetaminophen 10-325 MG tablet  Commonly known as:  PERCOCET  Take 1 tablet by mouth 4 (four) times daily.     predniSONE 10 MG (21) Tbpk tablet  Commonly known as:   STERAPRED UNI-PAK 21 TAB  Take 1 tablet (10 mg total) by mouth daily. 4 tab po daily for 3 days, then 3 tab po daily for 3 days, then 2 tab po daily for 3 days, then 1 tab po daily for 3 days.     tiotropium 18 MCG inhalation capsule  Commonly known as:  SPIRIVA  Place 18 mcg into inhaler and inhale every evening.     torsemide 20 MG tablet  Commonly known as:  DEMADEX  Take 1 tablet (20 mg total) by mouth 2 (two) times daily.        Discharged Condition: improved    Consults: none   Significant Diagnostic Studies: Dg Chest 2 View  04/11/2016  CLINICAL DATA:  Has not been feeling well for a few days, chest pain and heaviness, shortness of breath, productive cough, congestion, and weakness, personal history of pneumonia, CHF, COPD, hypertension, MI, stroke EXAM: CHEST  2 VIEW COMPARISON:  12/27/2015 FINDINGS: Upper normal heart size. Calcified tortuous aorta. Mediastinal contours and pulmonary vascularity normal. Emphysematous changes with scarring at mid inferior lungs bilaterally. No acute infiltrate, pleural effusion or pneumothorax. Bones diffusely demineralized with thoracolumbar scoliosis. IMPRESSION: COPD changes with linear scarring in mid to lower  lungs bilaterally. No acute abnormalities. Electronically Signed   By: Ulyses Southward M.D.   On: 04/11/2016 15:39    Lab Results: Basic Metabolic Panel:  Recent Labs  40/98/11 0634  NA 141  K 3.9  CL 96*  CO2 37*  GLUCOSE 99  BUN 48*  CREATININE 1.15*  CALCIUM 8.9   Liver Function Tests: No results for input(s): AST, ALT, ALKPHOS, BILITOT, PROT, ALBUMIN in the last 72 hours.   CBC:  Recent Labs  04/16/16 0634  WBC 8.7  HGB 11.6*  HCT 35.0*  MCV 94.9  PLT 272    No results found for this or any previous visit (from the past 240 hour(s)).   Hospital Course:   This is a 82 years old female with history of multiple medical illnesses was admitted due to pneumonia and COPD exacerbation. Patient was treated with IV  steroid, IV antibiotics and nebulizer treatment. Over the hospital stay the patient improved. She is being discharged on oral antibiotics and dose pack. Patient will be followed by her PMD in 2 weeks.  Discharge Exam: Blood pressure 144/75, pulse 56, temperature 98.1 F (36.7 C), temperature source Oral, resp. rate 16, height  (1.549 m), weight 54.341 kg (119 lb 12.8 oz), SpO2 96 %.   Disposition:  Home        Follow-up Information    Follow up with HAWKINS,EDWARD L, MD In 2 weeks.   Specialty:  Pulmonary Disease   Contact information:   [address] [place] [address] [phone]       Signed: Jaquaveon Bilal   04/16/2016, 8:28 AM

## 2016-04-16 NOTE — Care Management Note (Signed)
Case Management Note  Patient Details  Name: Melanie Lowe MRN: [id number] Date of Birth: [birth year]  Subjective/Objective:       Spoke with patient who is alert and oriented from home with son. Patient stated that she walks without assistance of walker although she has a walker and wheelchair.  She stated that she lives in an apartment that is attached to her sons ome and has all her own amenities. Patient wants to get home to see her  Pet dog today.     Patient has home O2 and stated that her son would pick her up and bring her portable tank.      Action/Plan:Home with self care.   Expected Discharge Date:  04/14/16               Expected Discharge Plan:  Home/Self Care  In-House Referral:  Clinical Social Work  Discharge planning Services     Post Acute Care Choice:    Choice offered to:     DME Arranged:    DME Agency:     HH Arranged:    HH Agency:     Status of Service:  Completed, signed off  Medicare Important Message Given:  Yes Date Medicare IM Given:    Medicare IM give by:    Date Additional Medicare IM Given:    Additional Medicare Important Message give by:     If discussed at Long Length of Stay Meetings, dates discussed:    Additional Comments:  Adonis Huguenin, RN 04/16/2016, 10:25 AM

## 2016-04-17 ENCOUNTER — Other Ambulatory Visit: Payer: Self-pay | Admitting: *Deleted

## 2016-04-17 NOTE — Patient Outreach (Signed)
[facility]) Care Management Transition of Care Assessment Outreach/Week #1  04/17/2016  Melanie Lowe Apr 09, 1924 [id number]  I reached out to Melanie Lowe today for transition of care assessment. She was hospitalized at [facility] from 04/11/16 - 04/16/16 having been treated for pneumonia/acute on chronic respiratory failure/COPD exacerbation.   Melanie Lowe is back home in her apartment where either her son or daughter in law is with her at all times. Today, I spoke with Melanie Lowe (daughter in law) who is her primary caregiver. Melanie Lowe reports that Melanie Lowe is glad to be home. Melanie Lowe has all prescribed medications. Melanie Lowe called Melanie Lowe' office today to schedule a post hospital follow up appointment and left a message. I also called and left a follow up message requesting a return call to [facility] for appointment scheduling.   Melanie Lowe reports that Melanie Lowe is doing well, has her home O2, and denies having any questions related to her hospitalization. She is open to a home visit.   Plan: I will provide the aforementioned information to Melanie Lowe primary care manager Melanie Drown RN, BSN, CCM for follow up.    Marja Kays MHA,BSN,RN,CCM [facility] Care Management  [phone]

## 2016-04-25 ENCOUNTER — Other Ambulatory Visit: Payer: Self-pay | Admitting: *Deleted

## 2016-04-25 NOTE — Patient Outreach (Addendum)
[facility]) Care Management   04/25/2016  Melanie Lowe June 16, 1924 [id number]  Melanie Lowe is an 82 y.o. female  Subjective:  "i am lightheaded and dizzy" "I just don't feel well" Patient reports she started feeling lightheaded this am when she was cooking some chicken.  Patient has had pedal edema since coming home from hospital, caregiver reports that it gets worse when she is up on her feet. Patient states she does not want to go to ED right now, she will lay down for a little bit and see if her dizziness will get better.  Caregiver will stay with patient and monitor patient, they can get someone to transport to ED if patient decides to go.  Objective:   BP 128/84 mmHg  Pulse 66  Resp 20  Wt 122 lb (55.339 kg)  SpO2 93% Review of Systems  Constitutional: Negative.   Eyes: Negative.   Cardiovascular: Positive for leg swelling.       Pedal edema, right greater than left  Gastrointestinal: Positive for constipation.  Skin: Negative.   Neurological: Positive for dizziness.    Physical Exam  Constitutional: She is oriented to person, place, and time.  Cardiovascular: Normal rate.   Respiratory: Effort normal.  GI: Bowel sounds are normal. She exhibits distension.  Musculoskeletal:  Moving very slow  Neurological: She is alert and oriented to person, place, and time.  Skin: Skin is warm.    Encounter Medications:   Outpatient Encounter Prescriptions as of 04/25/2016  Medication Sig Note  . acetaminophen (TYLENOL) 500 MG tablet Take 250-500 mg by mouth daily as needed for mild pain or moderate pain. Reported on 04/11/2016   . albuterol (PROVENTIL) (2.5 MG/3ML) 0.083% nebulizer solution Take 2.5 mg by nebulization 4 (four) times daily.    Marland Kitchen amoxicillin-clavulanate (AUGMENTIN) 500-125 MG tablet Take 1 tablet (500 mg total) by mouth 3 (three) times daily.   Marland Kitchen aspirin EC 81 MG tablet Take 81 mg by mouth daily. 04/11/2016: 4 given via ems  . budesonide-formoterol  (SYMBICORT) 160-4.5 MCG/ACT inhaler Inhale 2 puffs into the lungs 2 (two) times daily.    . citalopram (CELEXA) 20 MG tablet Take 20 mg by mouth daily.    . diazepam (VALIUM) 10 MG tablet Take 10 mg by mouth 3 (three) times daily as needed for anxiety.   . gabapentin (NEURONTIN) 100 MG capsule Take 100 mg by mouth 5 (five) times daily.  12/16/2015: 5x a day  . oxyCODONE-acetaminophen (PERCOCET) 10-325 MG tablet Take 1 tablet by mouth 4 (four) times daily. 12/27/2015: Received from: External Pharmacy Received Sig: TK 1 T PO QID PRN  . predniSONE (STERAPRED UNI-PAK 21 TAB) 10 MG (21) TBPK tablet Take 1 tablet (10 mg total) by mouth daily. 4 tab po daily for 3 days, then 3 tab po daily for 3 days, then 2 tab po daily for 3 days, then 1 tab po daily for 3 days.   Marland Kitchen tiotropium (SPIRIVA) 18 MCG inhalation capsule Place 18 mcg into inhaler and inhale every evening.    . torsemide (DEMADEX) 20 MG tablet Take 1 tablet (20 mg total) by mouth 2 (two) times daily. 03/27/2016: Education on taking the second one earlier in day    No facility-administered encounter medications on file as of 04/25/2016.      Assessment:   SAFETY: discussed for patient not to use stove today while dizzy  And light headed, just to rest until she is better. Use walker or cane for  ambulation and have someone with her when she ambulates to bathroom. HF: weight is up to 122#, patient has edema in feet/ankles HTN: patient feeling dizzy and lightheaded  Plan:  Call to MD office, primary care is out until May 1. Covering MD is out of the office this afternoon, nurse directs patient to ED if she gets worse for evaluation Plan is for patient to lay down and if she continues to feel bad, will go to ED for evaluation. Primary care is out of town and covering MD is out of office this afternoon. Caregiver will stay close to patient and assist with ambulation and monitor for safety. Caregiver will call RNCM with any further questions or  concerns. RNCM will continue transition of care program calls.  Alben Spittle. [name], RN, BSN, CCM  [facility] [employer] [phone]

## 2016-04-25 NOTE — Patient Outreach (Signed)
I reached out to Melanie Lowe today for transition of care assessment. She was hospitalized at [facility] from 04/11/16 - 04/16/16 having been treated for pneumonia/acute on chronic respiratory failure/COPD exacerbation.  Spoke with caregiver Melanie Lowe Pt has been doing well but today she has had a dizzy spell and her feet and legs are swollen some, weight is up to 122#. Melanie Lowe thinks weight is weight gain from the prednisone dose pack.   Plan to make acute visit with patient today to assess. Patient primary care physician is out of town this week.  Melanie Lowe. Melanie Ghee, RN, BSN, CCM  [facility] [employer] [phone]

## 2016-04-26 ENCOUNTER — Other Ambulatory Visit: Payer: Self-pay | Admitting: *Deleted

## 2016-04-26 NOTE — Patient Outreach (Signed)
Call to patient home, spoke with Caregiver, [name]. She reports that patient is doing much better, she reports patient rested yesterday afternoon and today she is much improved. Weight still at 122#, no new issues or concerns. Caregiver agrees to call RNCM with questions and she can call MD on call for primary and always has the 24hr nurse line available. Plan to continue transition of care program calls. Alben Spittle. [name], RN, BSN, CCM  [facility] [employer] [phone]

## 2016-05-04 ENCOUNTER — Other Ambulatory Visit: Payer: Self-pay | Admitting: *Deleted

## 2016-05-04 NOTE — Patient Outreach (Signed)
Transition of care call  Spoke with patient caregiver, [name]. She reports patient is doing well, reports no more dizziness.  Weights are stable at 122#, still getting some edema in her feet and ankles when she is up but it does down when she rests and puts feet/legs up. Patient caregiver was able to get Spiriva filled last week and picked up samples of Symbicort at MD office.  Patient has f/u appointment with Dr. Luan Pulling 5/11.  [facility] CM Care Plan Problem One        Most Recent Value   Care Plan Problem One  Chest pain   Role Documenting the Problem One  Care Management Coordinator   Care Plan for Problem One  Not Active   [facility] CM Short Term Goal #1 (0-30 days)  Patient will be treated for her chest pain    [facility] CM Short Term Goal #1 Start Date  04/11/16   [facility] CM Short Term Goal #1 Met Date  05/04/16   Interventions for Short Term Goal #1  reviewed for any chest pain or symptoms, denies any new issues    [facility] CM Care Plan Problem Two        Most Recent Value   Care Plan Problem Two  Patient needs follow up appointment with Primary Care MD as evidenced by had to cancel 12/14/15 appointment due to transportatio   Role Documenting the Problem Two  Care Management Coordinator   [facility] CM Short Term Goal #1 (0-30 days)  Patient will see primary care MD over next 21 days   [facility] CM Short Term Goal #1 Start Date  05/04/16   Interventions for Short Term Goal #2   verified patient has f/u appointment for 5/11    [facility] CM Care Plan Problem Three        Most Recent Value   Care Plan Problem Three  Medication affordability    Role Documenting the Problem Three  Care Management Coordinator   Care Plan for Problem Three  Active   [facility] CM Short Term Goal #1 (0-30 days)  over the next30 days patient caregiver will have required documents for medication assistance   [facility] CM Short Term Goal #1 Start Date  02/28/16   Interventions for Short Term Goal #1  Call to [place] at MD office to follow up on PAP paperwork,  Verified patient has her medications     PLAN: Plan continue in transition of care  Will visit later in month.  [name], patient caregiver verbalizes understanding of who to call for concerns or questions.  Royetta Crochet. Laymond Purser, RN, BSN, [place] [phone]

## 2016-05-10 ENCOUNTER — Other Ambulatory Visit: Payer: Self-pay | Admitting: *Deleted

## 2016-05-10 DIAGNOSIS — I714 Abdominal aortic aneurysm, without rupture: Secondary | ICD-10-CM | POA: Diagnosis not present

## 2016-05-10 DIAGNOSIS — J449 Chronic obstructive pulmonary disease, unspecified: Secondary | ICD-10-CM | POA: Diagnosis not present

## 2016-05-10 DIAGNOSIS — I10 Essential (primary) hypertension: Secondary | ICD-10-CM | POA: Diagnosis not present

## 2016-05-10 DIAGNOSIS — M545 Low back pain: Secondary | ICD-10-CM | POA: Diagnosis not present

## 2016-05-10 NOTE — Patient Outreach (Signed)
Transition of care call, patient with inpatient admission 4/12 to 4/17  Spoke with patient caregiver [name]. Patient has MD appointment at 3 pm today. Patient weight is stable at 122#, no swelling in feet or ankles, no HF or COPD symptoms. However, caregiver reports a new issue: stating a "knot" has appeared in patient lower left side, that comes and goes over the past couple of weeks. Denies any issues with voiding or having BM. [name] will let MD know about area and have him assess it at appointment today.  Plan Caregiver will let MD know about lower left side issue. RNCM will visit later in month. Alben Spittle. [name], RN, BSN, CCM  [facility] [employer] [phone]

## 2016-05-11 DIAGNOSIS — R0902 Hypoxemia: Secondary | ICD-10-CM | POA: Diagnosis not present

## 2016-05-14 ENCOUNTER — Encounter (HOSPITAL_COMMUNITY): Payer: Self-pay | Admitting: Emergency Medicine

## 2016-05-14 ENCOUNTER — Emergency Department (HOSPITAL_COMMUNITY): Payer: Commercial Managed Care - HMO

## 2016-05-14 ENCOUNTER — Emergency Department (HOSPITAL_COMMUNITY)
Admission: EM | Admit: 2016-05-14 | Discharge: 2016-05-14 | Disposition: A | Discharge status: Home / Self Care | Payer: Commercial Managed Care - HMO | Attending: Emergency Medicine | Admitting: Emergency Medicine

## 2016-05-14 DIAGNOSIS — J449 Chronic obstructive pulmonary disease, unspecified: Secondary | ICD-10-CM | POA: Insufficient documentation

## 2016-05-14 DIAGNOSIS — Z79899 Other long term (current) drug therapy: Secondary | ICD-10-CM | POA: Insufficient documentation

## 2016-05-14 DIAGNOSIS — R42 Dizziness and giddiness: Secondary | ICD-10-CM | POA: Diagnosis not present

## 2016-05-14 DIAGNOSIS — I252 Old myocardial infarction: Secondary | ICD-10-CM | POA: Insufficient documentation

## 2016-05-14 DIAGNOSIS — M1990 Unspecified osteoarthritis, unspecified site: Secondary | ICD-10-CM | POA: Insufficient documentation

## 2016-05-14 DIAGNOSIS — Z7982 Long term (current) use of aspirin: Secondary | ICD-10-CM | POA: Insufficient documentation

## 2016-05-14 DIAGNOSIS — Z8673 Personal history of transient ischemic attack (TIA), and cerebral infarction without residual deficits: Secondary | ICD-10-CM | POA: Insufficient documentation

## 2016-05-14 DIAGNOSIS — Z87891 Personal history of nicotine dependence: Secondary | ICD-10-CM | POA: Insufficient documentation

## 2016-05-14 DIAGNOSIS — R0602 Shortness of breath: Secondary | ICD-10-CM | POA: Insufficient documentation

## 2016-05-14 DIAGNOSIS — R197 Diarrhea, unspecified: Secondary | ICD-10-CM | POA: Insufficient documentation

## 2016-05-14 DIAGNOSIS — I110 Hypertensive heart disease with heart failure: Secondary | ICD-10-CM | POA: Insufficient documentation

## 2016-05-14 DIAGNOSIS — I509 Heart failure, unspecified: Secondary | ICD-10-CM | POA: Insufficient documentation

## 2016-05-14 DIAGNOSIS — F329 Major depressive disorder, single episode, unspecified: Secondary | ICD-10-CM | POA: Diagnosis not present

## 2016-05-14 DIAGNOSIS — R404 Transient alteration of awareness: Secondary | ICD-10-CM | POA: Diagnosis not present

## 2016-05-14 DIAGNOSIS — R918 Other nonspecific abnormal finding of lung field: Secondary | ICD-10-CM | POA: Diagnosis not present

## 2016-05-14 LAB — CBC WITH DIFFERENTIAL/PLATELET
BASOS PCT: 0 %
Basophils Absolute: 0 10*3/uL (ref 0.0–0.1)
Eosinophils Absolute: 0.1 10*3/uL (ref 0.0–0.7)
Eosinophils Relative: 2 %
HEMATOCRIT: 33.6 % — AB (ref 36.0–46.0)
Hemoglobin: 11 g/dL — ABNORMAL LOW (ref 12.0–15.0)
LYMPHS ABS: 1.3 10*3/uL (ref 0.7–4.0)
LYMPHS PCT: 33 %
MCH: 31.1 pg (ref 26.0–34.0)
MCHC: 32.7 g/dL (ref 30.0–36.0)
MCV: 94.9 fL (ref 78.0–100.0)
MONO ABS: 0.5 10*3/uL (ref 0.1–1.0)
MONOS PCT: 13 %
NEUTROS ABS: 2.1 10*3/uL (ref 1.7–7.7)
Neutrophils Relative %: 52 %
Platelets: 291 10*3/uL (ref 150–400)
RBC: 3.54 MIL/uL — ABNORMAL LOW (ref 3.87–5.11)
RDW: 13.9 % (ref 11.5–15.5)
WBC: 4 10*3/uL (ref 4.0–10.5)

## 2016-05-14 LAB — COMPREHENSIVE METABOLIC PANEL
ALK PHOS: 61 U/L (ref 38–126)
ALT: 11 U/L — ABNORMAL LOW (ref 14–54)
ANION GAP: 5 (ref 5–15)
AST: 16 U/L (ref 15–41)
Albumin: 3.3 g/dL — ABNORMAL LOW (ref 3.5–5.0)
BUN: 23 mg/dL — ABNORMAL HIGH (ref 6–20)
CALCIUM: 9.1 mg/dL (ref 8.9–10.3)
CO2: 29 mmol/L (ref 22–32)
Chloride: 103 mmol/L (ref 101–111)
Creatinine, Ser: 0.9 mg/dL (ref 0.44–1.00)
GFR calc non Af Amer: 55 mL/min — ABNORMAL LOW (ref >60)
Glucose, Bld: 77 mg/dL (ref 65–99)
Potassium: 3.7 mmol/L (ref 3.5–5.1)
Sodium: 137 mmol/L (ref 135–145)
Total Bilirubin: 0.4 mg/dL (ref 0.3–1.2)
Total Protein: 6.5 g/dL (ref 6.5–8.1)

## 2016-05-14 LAB — URINALYSIS, ROUTINE W REFLEX MICROSCOPIC
BILIRUBIN URINE: NEGATIVE
Glucose, UA: NEGATIVE mg/dL
HGB URINE DIPSTICK: NEGATIVE
KETONES UR: NEGATIVE mg/dL
Leukocytes, UA: NEGATIVE
NITRITE: NEGATIVE
PH: 6 (ref 5.0–8.0)
Protein, ur: NEGATIVE mg/dL
Specific Gravity, Urine: 1.01 (ref 1.005–1.030)

## 2016-05-14 LAB — BRAIN NATRIURETIC PEPTIDE: B NATRIURETIC PEPTIDE 5: 38 pg/mL (ref 0.0–100.0)

## 2016-05-14 LAB — C DIFFICILE QUICK SCREEN W PCR REFLEX
C DIFFICILE (CDIFF) TOXIN: NEGATIVE
C Diff antigen: NEGATIVE
C Diff interpretation: NEGATIVE

## 2016-05-14 LAB — TROPONIN I: Troponin I: <0.03 ng/mL (ref <0.031)

## 2016-05-14 MED ORDER — MECLIZINE HCL 12.5 MG PO TABS
12.5000 mg | ORAL_TABLET | Freq: Once | ORAL | Status: AC
  Administered 2016-05-14: 12.5 mg via ORAL
  Filled 2016-05-14: qty 1

## 2016-05-14 MED ORDER — MECLIZINE HCL 25 MG PO TABS: 25.0000 mg | ORAL_TABLET | Freq: Three times a day (TID) | ORAL | Status: DC | PRN

## 2016-05-14 NOTE — ED Notes (Signed)
[name] or [facility] [phone]

## 2016-05-14 NOTE — Discharge Instructions (Signed)
Please use caution getting around. Increase water and gatorade. Use Antivert three times daily if needed for dizziness. This medication may cause drowsiness, use with caution. Please see Dr Juanetta Gosling or return to the Emergency Dept  Dizziness Dizziness is a common problem. It is a feeling of unsteadiness or light-headedness. You may feel like you are about to faint. Dizziness can lead to injury if you stumble or fall. Anyone can become dizzy, but dizziness is more common in older adults. This condition can be caused by a number of things, including medicines, dehydration, or illness. HOME CARE INSTRUCTIONS Taking these steps may help with your condition: Eating and Drinking  Drink enough fluid to keep your urine clear or pale yellow. This helps to keep you from becoming dehydrated. Try to drink more clear fluids, such as water.  Do not drink alcohol.  Limit your caffeine intake if directed by your health care provider.  Limit your salt intake if directed by your health care provider. Activity  Avoid making quick movements.  Rise slowly from chairs and steady yourself until you feel okay.  In the morning, first sit up on the side of the bed. When you feel okay, stand slowly while you hold onto something until you know that your balance is fine.  Move your legs often if you need to stand in one place for a long time. Tighten and relax your muscles in your legs while you are standing.  Do not drive or operate heavy machinery if you feel dizzy.  Avoid bending down if you feel dizzy. Place items in your home so that they are easy for you to reach without leaning over. Lifestyle  Do not use any tobacco products, including cigarettes, chewing tobacco, or electronic cigarettes. If you need help quitting, ask your health care provider.  Try to reduce your stress level, such as with yoga or meditation. Talk with your health care provider if you need help. General Instructions  Watch your  dizziness for any changes.  Take medicines only as directed by your health care provider. Talk with your health care provider if you think that your dizziness is caused by a medicine that you are taking.  Tell a friend or a family member that you are feeling dizzy. If he or she notices any changes in your behavior, have this person call your health care provider.  Keep all follow-up visits as directed by your health care provider. This is important. SEEK MEDICAL CARE IF:  Your dizziness does not go away.  Your dizziness or light-headedness gets worse.  You feel nauseous.  You have reduced hearing.  You have new symptoms.  You are unsteady on your feet or you feel like the room is spinning. SEEK IMMEDIATE MEDICAL CARE IF:  You vomit or have diarrhea and are unable to eat or drink anything.  You have problems talking, walking, swallowing, or using your arms, hands, or legs.  You feel generally weak.  You are not thinking clearly or you have trouble forming sentences. It may take a friend or family member to notice this.  You have chest pain, abdominal pain, shortness of breath, or sweating.  Your vision changes.  You notice any bleeding.  You have a headache.  You have neck pain or a stiff neck.  You have a fever.   This information is not intended to replace advice given to you by your health care provider. Make sure you discuss any questions you have with your health care provider.  Document Released: 06/12/2001 Document Revised: 05/03/2015 Document Reviewed: 12/13/2014 Elsevier Interactive Patient Education [employer] for additional evaluation if not improving.

## 2016-05-14 NOTE — ED Provider Notes (Signed)
CSN: [id number]     Arrival date & time 05/14/16  1030 History   First MD Initiated Contact with Patient 05/14/16 1035     Chief Complaint  Patient presents with  . Dizziness     (Consider location/radiation/quality/duration/timing/severity/associated sxs/prior Treatment) HPI Comments: Patient is a 82 year old female who presents to the emergency department with a complaint of dizziness, and some shortness of breath.  Patient and the EMS personnel state the patient was at home. She tells me that when she got up this morning she was in her usual state of health, she had no problems on yesterday. She was eating a hot dog with her son, when she states that the room began to spend. She did not have an episode of vomiting, and she did not have loss of consciousness. She states that she had some mild left chest pain, that she rates as a 2 on a 1-10 scale. She states she had her 81 mg aspirin earlier today. EMS reports that the patient has home oxygen, she was on 2 L on per minute. Her O2 saturation on the scene was 87, the patient came up to 96-98 on 4 L by nasal cannula. EMS reports the patient has been awake and alert during the transport and arrival to the emergency department. The patient denies any high fever, or vomiting. She does report some diarrhea and loose stools on yesterday and today. There's been no recent fall or injury to the head. His been no recent changes in medications. No recent diet changes, patient states she has been eating like usual. She denies any urinary symptoms at this time.   The history is provided by the patient and the EMS personnel.    Past Medical History  Diagnosis Date  . COPD (chronic obstructive pulmonary disease) (HCC)   . Hypertension   . Anxiety   . Depression   . Arthritis   . AAA (abdominal aortic aneurysm) (HCC)   . Neuropathy (HCC)   . Leg swelling   . Oxygen dependent   . Myocardial infarction (HCC)   . Pneumonia   . Stroke (HCC)   . CHF  (congestive heart failure) (HCC) 11/09/2015   Past Surgical History  Procedure Laterality Date  . Cholecystectomy    . Appendectomy     Family History  Problem Relation Age of Onset  . Diabetes Brother   . Cancer Brother     Throat cancer, Heart attack  . Heart disease Brother   . Heart attack Brother   . Cancer Father   . Cancer Sister    Social History  Substance Use Topics  . Smoking status: Former Smoker -- 50 years    Types: Cigarettes    Quit date: 12/31/2010  . Smokeless tobacco: Former Neurosurgeon    Quit date: 12/31/2010  . Alcohol Use: No   OB History    No data available     Review of Systems  Constitutional: Negative for fever, chills, diaphoresis, activity change and appetite change.  Respiratory: Positive for shortness of breath.   Cardiovascular: Positive for chest pain.  Gastrointestinal: Positive for diarrhea.  Musculoskeletal: Positive for arthralgias.  Neurological: Positive for dizziness. Negative for seizures, syncope and speech difficulty.  All other systems reviewed and are negative.     Allergies  Ciprofloxacin hcl; Nsaids; Other; and Tomato  Home Medications   Prior to Admission medications   Medication Sig Start Date End Date Taking? Authorizing Provider  acetaminophen (TYLENOL) 500 MG tablet Take 250-500 mg  by mouth daily as needed for mild pain or moderate pain. Reported on 04/11/2016    Historical Provider, MD  albuterol (PROVENTIL) (2.5 MG/3ML) 0.083% nebulizer solution Take 2.5 mg by nebulization 4 (four) times daily.     Historical Provider, MD  amoxicillin-clavulanate (AUGMENTIN) 500-125 MG tablet Take 1 tablet (500 mg total) by mouth 3 (three) times daily. 04/16/16   Avon Gully, MD  aspirin EC 81 MG tablet Take 81 mg by mouth daily.    Historical Provider, MD  budesonide-formoterol (SYMBICORT) 160-4.5 MCG/ACT inhaler Inhale 2 puffs into the lungs 2 (two) times daily.     Historical Provider, MD  citalopram (CELEXA) 20 MG tablet Take 20  mg by mouth daily.     Historical Provider, MD  diazepam (VALIUM) 10 MG tablet Take 10 mg by mouth 3 (three) times daily as needed for anxiety.    Historical Provider, MD  gabapentin (NEURONTIN) 100 MG capsule Take 100 mg by mouth 5 (five) times daily.  09/06/15   Historical Provider, MD  oxyCODONE-acetaminophen (PERCOCET) 10-325 MG tablet Take 1 tablet by mouth 4 (four) times daily. 12/21/15   Historical Provider, MD  predniSONE (STERAPRED UNI-PAK 21 TAB) 10 MG (21) TBPK tablet Take 1 tablet (10 mg total) by mouth daily. 4 tab po daily for 3 days, then 3 tab po daily for 3 days, then 2 tab po daily for 3 days, then 1 tab po daily for 3 days. 04/16/16   Avon Gully, MD  tiotropium (SPIRIVA) 18 MCG inhalation capsule Place 18 mcg into inhaler and inhale every evening.     Historical Provider, MD  torsemide (DEMADEX) 20 MG tablet Take 1 tablet (20 mg total) by mouth 2 (two) times daily. 11/12/15   Kari Baars, MD   BP 115/80 mmHg  Pulse 65  Temp(Src) 97.5 F (36.4 C) (Oral)  Resp 18  SpO2 98% Physical Exam  Constitutional: She is oriented to person, place, and time. She appears well-developed and well-nourished.  Non-toxic appearance.  HENT:  Head: Normocephalic.  Right Ear: Tympanic membrane and external ear normal.  Left Ear: Tympanic membrane and external ear normal.  Eyes: EOM and lids are normal. Pupils are equal, round, and reactive to light.  Neck: Normal range of motion. Neck supple. Carotid bruit is not present.  Cardiovascular: Normal rate, regular rhythm, normal heart sounds, intact distal pulses and normal pulses.   Pulmonary/Chest: Breath sounds normal. No respiratory distress.  Abdominal: Soft. Bowel sounds are normal. She exhibits no pulsatile midline mass. There is no hepatosplenomegaly. There is tenderness in the epigastric area and suprapubic area. There is no guarding.  Musculoskeletal: Normal range of motion.  No swelling of the lower extremities. Negative Homans sign.  No pitting edema appreciated.  Lymphadenopathy:       Head (right side): No submandibular adenopathy present.       Head (left side): No submandibular adenopathy present.    She has no cervical adenopathy.  Neurological: She is alert and oriented to person, place, and time. She has normal strength. No cranial nerve deficit or sensory deficit.  Skin: Skin is warm and dry.  Psychiatric: She has a normal mood and affect. Her speech is normal.  Nursing note and vitals reviewed.   ED Course Patient seen with me by Dr. Judd Lien.  Procedures (including critical care time) Labs Review Labs Reviewed  BRAIN NATRIURETIC PEPTIDE  TROPONIN I    Imaging Review No results found. I have personally reviewed and evaluated these images and lab  results as part of my medical decision-making.   EKG Interpretation None      MDM  Vital signs reviewed. Patient had a few episodes of bradycardia, but no high degree blocks noted on monitor. No drops in blood pressure.  The complete blood count shows no acute problem. The components of metabolic panel shows no acute problem. The urinalysis negative for urinary tract infection or kidney stone. The patient had some bouts with loose stool/diarrhea. C. difficile was found to be negative. The been atretic peptide is normal at 38.8, the patient has a history of some congestive heart failure in the past. The troponin is negative for acute event at less than 0.03. No evidence for acute cardiac related event related to the dizziness. Chest x-ray shows no evidence of pulmonary edema or consolidation. Is no pleural effusion. There is an opacity at the right lung base that is felt to be atelectasis. A CT scan of the head shows moderately advanced small vessel disease in the preventricular white matter, there is an oval cone or infarct noted on the right. There no acute findings appreciated at this time.  I discuss the case preliminarily with the son and daughter-in-law. They  left the hospital before the complete workup could be completed. I discussed the entire case with the patient. After being treated with Antivert, she seems to be more awake and alert. She can give the full details of the events of the day leading to her coming to the emergency room. She says the spinning sensation is improving.  Feel that it is safe for the patient to be discharged home with close outpatient follow-up, and prescription for Antivert. The patient is in agreement with this discharge plan.    Final diagnoses:  None    *I have reviewed nursing notes, vital signs, and all appropriate lab and imaging results for this patient.    Ivery Quale, PA-C 05/14/16 3383  Geoffery Lyons, MD 05/15/16 [phone]

## 2016-05-14 NOTE — ED Notes (Addendum)
Pt says she was at home in her bedroom and her room was spinning, but did not lose continuousness.  Pt able to tell me her name, daughter's name, the year and her location.  C/o pain pain to left chest, rates pain 2/10.  Pt on O2@4L /M via n/c.  Pt says he was eating a hot dog and room began to spin.

## 2016-05-14 NOTE — ED Notes (Signed)
Called Son and informed of pts discharge disposition.

## 2016-05-18 ENCOUNTER — Other Ambulatory Visit: Payer: Self-pay | Admitting: *Deleted

## 2016-05-18 NOTE — Patient Outreach (Addendum)
Transition of Care call  Spoke with patient Patient states she she ended up going to ED a couple of days ago because she was dizzy and they ran some tests, but not sure if they found anything, states her caregiver, Einar Grad has her papers Patient states they gave her a medications for dizziness and it has helped her some. Patient reports weight is stable around 122# she thinks, not having any issues with breathing.  Patient states she and her caregiver helps her stay on low salt diet. She wants RNCM to talk with caregiver and will ask her to call RNCM back.  Plan: Patient will request caregiver to call RNCM  Patient verbalizes understanding of when to call MD Plan for routine home visit next week.  Melanie Lowe. Melanie Ghee, RN, BSN, CCM  [facility] [employer] [phone]

## 2016-05-22 ENCOUNTER — Encounter (HOSPITAL_COMMUNITY): Payer: Self-pay | Admitting: Emergency Medicine

## 2016-05-22 ENCOUNTER — Emergency Department (HOSPITAL_COMMUNITY)
Admission: EM | Admit: 2016-05-22 | Discharge: 2016-05-23 | Disposition: A | Discharge status: Home / Self Care | Payer: Commercial Managed Care - HMO | Attending: Emergency Medicine | Admitting: Emergency Medicine

## 2016-05-22 DIAGNOSIS — Z8673 Personal history of transient ischemic attack (TIA), and cerebral infarction without residual deficits: Secondary | ICD-10-CM | POA: Diagnosis not present

## 2016-05-22 DIAGNOSIS — Z87891 Personal history of nicotine dependence: Secondary | ICD-10-CM | POA: Insufficient documentation

## 2016-05-22 DIAGNOSIS — S50362A Insect bite (nonvenomous) of left elbow, initial encounter: Secondary | ICD-10-CM | POA: Diagnosis not present

## 2016-05-22 DIAGNOSIS — J449 Chronic obstructive pulmonary disease, unspecified: Secondary | ICD-10-CM | POA: Insufficient documentation

## 2016-05-22 DIAGNOSIS — W57XXXA Bitten or stung by nonvenomous insect and other nonvenomous arthropods, initial encounter: Secondary | ICD-10-CM | POA: Insufficient documentation

## 2016-05-22 DIAGNOSIS — M1990 Unspecified osteoarthritis, unspecified site: Secondary | ICD-10-CM | POA: Diagnosis not present

## 2016-05-22 DIAGNOSIS — R229 Localized swelling, mass and lump, unspecified: Secondary | ICD-10-CM | POA: Diagnosis not present

## 2016-05-22 DIAGNOSIS — T63301A Toxic effect of unspecified spider venom, accidental (unintentional), initial encounter: Secondary | ICD-10-CM | POA: Diagnosis not present

## 2016-05-22 DIAGNOSIS — Z79899 Other long term (current) drug therapy: Secondary | ICD-10-CM | POA: Insufficient documentation

## 2016-05-22 DIAGNOSIS — I509 Heart failure, unspecified: Secondary | ICD-10-CM | POA: Diagnosis not present

## 2016-05-22 DIAGNOSIS — Y939 Activity, unspecified: Secondary | ICD-10-CM | POA: Diagnosis not present

## 2016-05-22 DIAGNOSIS — T63484A Toxic effect of venom of other arthropod, undetermined, initial encounter: Secondary | ICD-10-CM | POA: Diagnosis not present

## 2016-05-22 DIAGNOSIS — Y999 Unspecified external cause status: Secondary | ICD-10-CM | POA: Insufficient documentation

## 2016-05-22 DIAGNOSIS — F329 Major depressive disorder, single episode, unspecified: Secondary | ICD-10-CM | POA: Diagnosis not present

## 2016-05-22 DIAGNOSIS — Y929 Unspecified place or not applicable: Secondary | ICD-10-CM | POA: Diagnosis not present

## 2016-05-22 DIAGNOSIS — I110 Hypertensive heart disease with heart failure: Secondary | ICD-10-CM | POA: Insufficient documentation

## 2016-05-22 DIAGNOSIS — I252 Old myocardial infarction: Secondary | ICD-10-CM | POA: Insufficient documentation

## 2016-05-22 MED ORDER — TRIAMCINOLONE ACETONIDE 0.5 % EX OINT: 1.0000 "application " | TOPICAL_OINTMENT | Freq: Two times a day (BID) | CUTANEOUS | Status: DC

## 2016-05-22 MED ORDER — LORATADINE 10 MG PO TABS
10.0000 mg | ORAL_TABLET | Freq: Every day | ORAL | Status: DC
  Administered 2016-05-22: 10 mg via ORAL
  Filled 2016-05-22: qty 1

## 2016-05-22 MED ORDER — LORATADINE 10 MG PO TABS: 10.0000 mg | ORAL_TABLET | Freq: Every day | ORAL | Status: DC

## 2016-05-22 MED ORDER — DOXYCYCLINE HYCLATE 100 MG PO CAPS: 100.0000 mg | ORAL_CAPSULE | Freq: Two times a day (BID) | ORAL | Status: DC

## 2016-05-22 MED ORDER — DOXYCYCLINE HYCLATE 100 MG PO TABS
100.0000 mg | ORAL_TABLET | Freq: Once | ORAL | Status: AC
Start: 2016-05-22 — End: 2016-05-22
  Administered 2016-05-22: 100 mg via ORAL
  Filled 2016-05-22: qty 1

## 2016-05-22 NOTE — ED Provider Notes (Signed)
CSN: [id number]     Arrival date & time 05/22/16  2214 History   First MD Initiated Contact with Patient 05/22/16 2308     Chief Complaint  Patient presents with  . Insect Bite     (Consider location/radiation/quality/duration/timing/severity/associated sxs/prior Treatment) HPI Comments: Patient is a 82 year old female who presents to the emergency department after sustaining a right to her left elbow.  The patient states that while sitting in her home she felt something sustained her on her left elbow. Shortly after that she had a red raised area of the left elbow. The family has been killing of several brown or close spiders, and they are concerned that the patient may have been bitten by one of the spiders. His been no difficulty with using the left upper extremity.  The history is provided by the patient.    Past Medical History  Diagnosis Date  . COPD (chronic obstructive pulmonary disease) (HCC)   . Hypertension   . Anxiety   . Depression   . Arthritis   . AAA (abdominal aortic aneurysm) (HCC)   . Neuropathy (HCC)   . Leg swelling   . Oxygen dependent   . Myocardial infarction (HCC)   . Pneumonia   . Stroke (HCC)   . CHF (congestive heart failure) (HCC) 11/09/2015   Past Surgical History  Procedure Laterality Date  . Cholecystectomy    . Appendectomy     Family History  Problem Relation Age of Onset  . Diabetes Brother   . Cancer Brother     Throat cancer, Heart attack  . Heart disease Brother   . Heart attack Brother   . Cancer Father   . Cancer Sister    Social History  Substance Use Topics  . Smoking status: Former Smoker -- 50 years    Types: Cigarettes    Quit date: 12/31/2010  . Smokeless tobacco: Former Neurosurgeon    Quit date: 12/31/2010  . Alcohol Use: No   OB History    No data available     Review of Systems  Constitutional: Negative for activity change.       All ROS Neg except as noted in HPI  HENT: Negative for nosebleeds.   Eyes: Negative  for photophobia and discharge.  Respiratory: Negative for cough, shortness of breath and wheezing.   Cardiovascular: Negative for chest pain and palpitations.  Gastrointestinal: Negative for abdominal pain and blood in stool.  Genitourinary: Negative for dysuria, frequency and hematuria.  Musculoskeletal: Positive for arthralgias. Negative for back pain and neck pain.  Skin: Negative.        Insect bite  Neurological: Negative for dizziness, seizures and speech difficulty.  Psychiatric/Behavioral: Negative for hallucinations and confusion.  All other systems reviewed and are negative.     Allergies  Ciprofloxacin hcl; Nsaids; Other; and Tomato  Home Medications   Prior to Admission medications   Medication Sig Start Date End Date Taking? Authorizing Provider  acetaminophen (TYLENOL) 500 MG tablet Take 250-500 mg by mouth daily as needed for mild pain or moderate pain. Reported on 04/11/2016    Historical Provider, MD  albuterol (PROVENTIL) (2.5 MG/3ML) 0.083% nebulizer solution Take 2.5 mg by nebulization 4 (four) times daily.     Historical Provider, MD  amoxicillin-clavulanate (AUGMENTIN) 500-125 MG tablet Take 1 tablet (500 mg total) by mouth 3 (three) times daily. Patient not taking: Reported on 05/14/2016 04/16/16   Avon Gully, MD  aspirin EC 81 MG tablet Take 81 mg by mouth daily.  Historical Provider, MD  budesonide-formoterol (SYMBICORT) 160-4.5 MCG/ACT inhaler Inhale 2 puffs into the lungs 2 (two) times daily.     Historical Provider, MD  citalopram (CELEXA) 20 MG tablet Take 20 mg by mouth daily.     Historical Provider, MD  diazepam (VALIUM) 10 MG tablet Take 10 mg by mouth 3 (three) times daily as needed for anxiety.    Historical Provider, MD  gabapentin (NEURONTIN) 100 MG capsule Take 100 mg by mouth 5 (five) times daily.  09/06/15   Historical Provider, MD  meclizine (ANTIVERT) 25 MG tablet Take 1 tablet (25 mg total) by mouth 3 (three) times daily as needed for  dizziness. 05/14/16   Ivery Quale, PA-C  oxyCODONE-acetaminophen (PERCOCET) 10-325 MG tablet Take 1 tablet by mouth 4 (four) times daily. 12/21/15   Historical Provider, MD  predniSONE (STERAPRED UNI-PAK 21 TAB) 10 MG (21) TBPK tablet Take 1 tablet (10 mg total) by mouth daily. 4 tab po daily for 3 days, then 3 tab po daily for 3 days, then 2 tab po daily for 3 days, then 1 tab po daily for 3 days. Patient not taking: Reported on 05/14/2016 04/16/16   Avon Gully, MD  tiotropium (SPIRIVA) 18 MCG inhalation capsule Place 18 mcg into inhaler and inhale every evening.     Historical Provider, MD  torsemide (DEMADEX) 20 MG tablet Take 1 tablet (20 mg total) by mouth 2 (two) times daily. 11/12/15   Kari Baars, MD   BP 108/70 mmHg  Pulse 63  Temp(Src) 97.8 F (36.6 C) (Oral)  Resp 20  Ht 5\' 1"  (1.549 m)  Wt 56.246 kg  BMI 23.44 kg/m2  SpO2 97% Physical Exam  Constitutional: She is oriented to person, place, and time. She appears well-developed and well-nourished.  Non-toxic appearance.  HENT:  Head: Normocephalic.  Right Ear: Tympanic membrane and external ear normal.  Left Ear: Tympanic membrane and external ear normal.  Eyes: EOM and lids are normal. Pupils are equal, round, and reactive to light.  Neck: Normal range of motion. Neck supple. Carotid bruit is not present.  Cardiovascular: Normal rate, regular rhythm, normal heart sounds, intact distal pulses and normal pulses.   Pulmonary/Chest: Breath sounds normal. No respiratory distress.  Abdominal: Soft. Bowel sounds are normal. There is no tenderness. There is no guarding.  Musculoskeletal: Normal range of motion.  There is a red slightly raised area with a punctate area in the center that resembles an insect bite mark on the lateral condyle of the left elbow. No red streaks appreciated. No drainage from the area. No effusion of the elbow..  Lymphadenopathy:       Head (right side): No submandibular adenopathy present.       Head  (left side): No submandibular adenopathy present.    She has no cervical adenopathy.  Neurological: She is alert and oriented to person, place, and time. She has normal strength. No cranial nerve deficit or sensory deficit.  Skin: Skin is warm and dry.  Psychiatric: She has a normal mood and affect. Her speech is normal.  Nursing note and vitals reviewed.   ED Course Pt seen with me by Dr Clayborne Dana.  Procedures (including critical care time) Labs Review Labs Reviewed - No data to display  Imaging Review No results found. I have personally reviewed and evaluated these images and lab results as part of my medical decision-making.   EKG Interpretation None      MDM  The examination favors an insect bite to  the elbow. There is no red streaking appreciated. There is no evidence of any anaphylactic type reaction. This examination favors a local reaction to insect bite of some form. The patient will be treated with doxycycline and triamcinolone cream and Claritin. The patient is to see her primary physician, or return to the emergency department immediately if any changes, problems, or concerns. The plan is been discussed with the patient in terms which he understands. She is in agreement with this discharge plan.    Final diagnoses:  Insect bite    *I have reviewed nursing notes, vital signs, and all appropriate lab and imaging results for this patient.[address] PA-C 05/23/16 0019  Marily Memos, MD 05/23/16 [phone]

## 2016-05-22 NOTE — ED Provider Notes (Signed)
Medical screening examination/treatment/procedure(s) were conducted as a shared visit with non-physician practitioner(s) and myself.  I personally evaluated the patient during the encounter.  erhtematous area to left lateral elbow with punctate wound in the middle. Rest of exam benign. Unsure of etiology of bite but c/w some type of insect. 2/2 spreading redness, possibly infected v localized allergic reaction. Will treat for both.    EKG Interpretation None        Marily Memos, MD 05/23/16 [phone]

## 2016-05-22 NOTE — Discharge Instructions (Signed)
Please cleanse the wound with soap and water. Apply triamcinolone ointment 2 times daily. Use Claritin each day for itching. Use doxycycline 2 times daily with a meal to prevent infection. Please see Dr. Juanetta Gosling or return to the emergency department if any signs of advancing infection.

## 2016-05-22 NOTE — ED Notes (Addendum)
Patient brought in by EMS with complaint of spider bite approximately one hour ago to left elbow area. Patient has redness, swelling, and what looks to be an insect bite on left elbow. Per EMS, family states they have killed several brown recluse spiders in the last week.

## 2016-05-23 ENCOUNTER — Other Ambulatory Visit: Payer: Self-pay | Admitting: *Deleted

## 2016-05-23 NOTE — Patient Outreach (Signed)
[facility]) Care Management   05/23/2016  Melanie Lowe 1924/08/01 [id number]  Melanie Lowe is an 82 y.o. female  Subjective:   Patient reports she wen to ED last night due to a spider bite. They state they do have brown recluse in the home. Patient was examined and given prescriptions, caregiver will get prescriptions filled today. Patient also reports her right arm and shoulder pain, states it started during this past hospitalization and has continued.  Her left lower abdominal pain was assessed by the doctor and they said it was a hernia and patient is not candidate for repair.   Objective:   Patient neatly groomed and dressed. Home cluttered. Patient sensitive to any touch to right shoulder/upper arm area, old bruise noted BP 102/60 mmHg  Pulse 60  Resp 20  Wt 122 lb (55.339 kg)  SpO2 94% Review of Systems  Constitutional: Negative.   Eyes: Negative.   Respiratory: Positive for shortness of breath.        With exertion  Cardiovascular: Negative.   Gastrointestinal: Negative.   Genitourinary: Positive for frequency.  Musculoskeletal: Positive for joint pain.  Skin:       Spider bite  Endo/Heme/Allergies: Bruises/bleeds easily.  Psychiatric/Behavioral: Negative.     Physical Exam  Skin: There is erythema.       Encounter Medications:   Outpatient Encounter Prescriptions as of 05/23/2016  Medication Sig Note  . acetaminophen (TYLENOL) 500 MG tablet Take 250-500 mg by mouth daily as needed for mild pain or moderate pain. Reported on 04/11/2016   . albuterol (PROVENTIL) (2.5 MG/3ML) 0.083% nebulizer solution Take 2.5 mg by nebulization 4 (four) times daily.    Marland Kitchen aspirin EC 81 MG tablet Take 81 mg by mouth daily. 04/11/2016: 4 given via ems  . budesonide-formoterol (SYMBICORT) 160-4.5 MCG/ACT inhaler Inhale 2 puffs into the lungs 2 (two) times daily.  05/04/2016: Samples given at MD office 05/03/16  . citalopram (CELEXA) 20 MG tablet Take 20 mg by mouth  daily.    . diazepam (VALIUM) 10 MG tablet Take 10 mg by mouth 3 (three) times daily as needed for anxiety.   Marland Kitchen doxycycline (VIBRAMYCIN) 100 MG capsule Take 1 capsule (100 mg total) by mouth 2 (two) times daily. 05/23/2016: Needs to pick up  . gabapentin (NEURONTIN) 100 MG capsule Take 100 mg by mouth 5 (five) times daily.  12/16/2015: 5x a day  . loratadine (CLARITIN) 10 MG tablet Take 1 tablet (10 mg total) by mouth daily. 05/23/2016: Needs to pick up  . meclizine (ANTIVERT) 25 MG tablet Take 1 tablet (25 mg total) by mouth 3 (three) times daily as needed for dizziness.   Marland Kitchen oxyCODONE-acetaminophen (PERCOCET) 10-325 MG tablet Take 1 tablet by mouth 4 (four) times daily. 05/23/2016: Md to change to medication without tylenol in June  . tiotropium (SPIRIVA) 18 MCG inhalation capsule Place 18 mcg into inhaler and inhale every evening.    . torsemide (DEMADEX) 20 MG tablet Take 1 tablet (20 mg total) by mouth 2 (two) times daily. 03/27/2016: Education on taking the second one earlier in day   . triamcinolone ointment (KENALOG) 0.5 % Apply 1 application topically 2 (two) times daily.   Marland Kitchen amoxicillin-clavulanate (AUGMENTIN) 500-125 MG tablet Take 1 tablet (500 mg total) by mouth 3 (three) times daily. (Patient not taking: Reported on 05/14/2016)   . predniSONE (STERAPRED UNI-PAK 21 TAB) 10 MG (21) TBPK tablet Take 1 tablet (10 mg total) by mouth daily. 4 tab po  daily for 3 days, then 3 tab po daily for 3 days, then 2 tab po daily for 3 days, then 1 tab po daily for 3 days. (Patient not taking: Reported on 05/14/2016)    No facility-administered encounter medications on file as of 05/23/2016.   Patient was recently discharged from hospital and all medications have been reviewed.  Assessment:   HF: weight is stable, no signs or symptoms noted Spider or insect bite: encouraged to take medications prescribed in ED yesterday  Plan:  [facility] CM Care Plan Problem One        Most Recent Value   Care Plan Problem One   knowledge deficit related to HF as evidenced by new diagnosis and questions around HF zones   Role Documenting the Problem One  Care Management Coordinator   Care Plan for Problem One  Active   [facility] Long Term Goal (31-90 days)  Patient and or caregiver will be able to verbalize HF symptoms and understand when to contact MD or 911 over the next 90 days   [facility] Long Term Goal Start Date  05/23/16   Interventions for Problem One Long Term Goal  Reinstructed on low salt diet, and importance of self monitoring and daily weights. Reviewed taking 2nd fluid pill earlier in day so patient not up all night to void   [facility] CM Short Term Goal #1 (0-30 days)  Patient will be compliant with daily weights and record in [facility] calendar on weight log over the next 30 days   [facility] CM Short Term Goal #1 Start Date  05/23/16   Interventions for Short Term Goal #1  Instructed to continue daily weights    [facility] CM Care Plan Problem Two        Most Recent Value   Care Plan Problem Two  Risk for infection related to insect/spider bite   Role Documenting the Problem Two  Care Management Coordinator   Care Plan for Problem Two  Active   [facility] CM Short Term Goal #1 (0-30 days)  Patient will report signs and symptoms of infection around insect/spider bite to MD   [facility] CM Short Term Goal #1 Start Date  05/23/16   Interventions for Short Term Goal #2   Reviewed signs and symptoms of infection to monitor for with patient and caregiver    [facility] CM Care Plan Problem Three        Most Recent Value   Care Plan Problem Three  Medication affordability    Role Documenting the Problem Three  Care Management Coordinator   Care Plan for Problem Three  Active   [facility] CM Short Term Goal #1 (0-30 days)  over the next30 days patient caregiver will have required documents for medication assistance   [facility] CM Short Term Goal #1 Start Date  02/28/16   Interventions for Short Term Goal #1  Call to [facility] at MD office to follow up on PAP paperwork, Verified  patient has her medications      Plan to visit again in June.  Caregiver to call MD office regarding right shoulder pain  Mary E. [name], RN, BSN, CCM  [facility] [employer] [phone]

## 2016-05-30 ENCOUNTER — Emergency Department (HOSPITAL_COMMUNITY)
Admission: EM | Admit: 2016-05-30 | Discharge: 2016-05-30 | Disposition: A | Discharge status: Home / Self Care | Payer: Commercial Managed Care - HMO | Attending: Emergency Medicine | Admitting: Emergency Medicine

## 2016-05-30 ENCOUNTER — Emergency Department (HOSPITAL_COMMUNITY): Payer: Commercial Managed Care - HMO

## 2016-05-30 ENCOUNTER — Encounter (HOSPITAL_COMMUNITY): Payer: Self-pay

## 2016-05-30 DIAGNOSIS — Z79899 Other long term (current) drug therapy: Secondary | ICD-10-CM | POA: Insufficient documentation

## 2016-05-30 DIAGNOSIS — Z87891 Personal history of nicotine dependence: Secondary | ICD-10-CM | POA: Insufficient documentation

## 2016-05-30 DIAGNOSIS — J449 Chronic obstructive pulmonary disease, unspecified: Secondary | ICD-10-CM | POA: Diagnosis not present

## 2016-05-30 DIAGNOSIS — I110 Hypertensive heart disease with heart failure: Secondary | ICD-10-CM | POA: Insufficient documentation

## 2016-05-30 DIAGNOSIS — M19011 Primary osteoarthritis, right shoulder: Secondary | ICD-10-CM | POA: Diagnosis not present

## 2016-05-30 DIAGNOSIS — Z8673 Personal history of transient ischemic attack (TIA), and cerebral infarction without residual deficits: Secondary | ICD-10-CM | POA: Insufficient documentation

## 2016-05-30 DIAGNOSIS — Z7982 Long term (current) use of aspirin: Secondary | ICD-10-CM | POA: Diagnosis not present

## 2016-05-30 DIAGNOSIS — M25511 Pain in right shoulder: Secondary | ICD-10-CM | POA: Diagnosis not present

## 2016-05-30 DIAGNOSIS — M79601 Pain in right arm: Secondary | ICD-10-CM

## 2016-05-30 DIAGNOSIS — I509 Heart failure, unspecified: Secondary | ICD-10-CM | POA: Diagnosis not present

## 2016-05-30 DIAGNOSIS — F329 Major depressive disorder, single episode, unspecified: Secondary | ICD-10-CM | POA: Insufficient documentation

## 2016-05-30 DIAGNOSIS — M1990 Unspecified osteoarthritis, unspecified site: Secondary | ICD-10-CM | POA: Diagnosis not present

## 2016-05-30 DIAGNOSIS — I252 Old myocardial infarction: Secondary | ICD-10-CM | POA: Insufficient documentation

## 2016-05-30 MED ORDER — HYDROCODONE-ACETAMINOPHEN 5-325 MG PO TABS
2.0000 | ORAL_TABLET | Freq: Once | ORAL | Status: AC
  Administered 2016-05-30: 2 via ORAL
  Filled 2016-05-30: qty 2

## 2016-05-30 NOTE — ED Notes (Signed)
EDP at bedside  

## 2016-05-30 NOTE — ED Provider Notes (Signed)
CSN: [id number]     Arrival date & time 05/30/16  4098 History  By signing my name below, I, Melanie Lowe, attest that this documentation has been prepared under the direction and in the presence of Melanie Ohara, MD. Electronically Signed: Bridgette Lowe, ED Scribe. 05/30/2016. 11:13 AM.   Chief Complaint  Patient presents with  . Arm Pain   The history is provided by the patient and a relative. No language interpreter was used.    HPI Comments: Melanie Lowe is a 82 y.o. female with h/o arthritis who presents to the Emergency Department complaining of moderate right shoulder pain with associated bruising onset three days ago. Patient told daughter-in-law that it feels "splintered" and as if something is poking her. Pain is exacerbated with movement. Per pt's daughter, patient's shoulder started hurting after being discharged in April 2017. After daughter contacted PCP, they advised her to do warm and cold compressions which alleviate her symptoms. Patient's daughter states that it came back three days ago with the same symptoms and also with a knot. Patient's daughter has tried to call PCP yesterday but with no feedback. Pt has tried Percocet which she has taken with no relief. Patient has no history of shoulder surgery. Patient denies fevers, chill, vomiting, or joint infection.    Past Medical History  Diagnosis Date  . COPD (chronic obstructive pulmonary disease) (HCC)   . Hypertension   . Anxiety   . Depression   . Arthritis   . AAA (abdominal aortic aneurysm) (HCC)   . Neuropathy (HCC)   . Leg swelling   . Oxygen dependent   . Myocardial infarction (HCC)   . Pneumonia   . Stroke (HCC)   . CHF (congestive heart failure) (HCC) 11/09/2015   Past Surgical History  Procedure Laterality Date  . Cholecystectomy    . Appendectomy     Family History  Problem Relation Age of Onset  . Diabetes Brother   . Cancer Brother     Throat cancer, Heart attack  . Heart disease Brother   . Heart  attack Brother   . Cancer Father   . Cancer Sister    Social History  Substance Use Topics  . Smoking status: Former Smoker -- 50 years    Types: Cigarettes    Quit date: 12/31/2010  . Smokeless tobacco: Former Neurosurgeon    Quit date: 12/31/2010  . Alcohol Use: No   OB History    No data available     Review of Systems  Constitutional: Negative for fever and chills.  Gastrointestinal: Negative for vomiting.  Musculoskeletal: Positive for myalgias and arthralgias (right shoulder).  Skin: Positive for color change (bruising).  All other systems reviewed and are negative.    Allergies  Ciprofloxacin hcl; Nsaids; Other; and Tomato  Home Medications   Prior to Admission medications   Medication Sig Start Date End Date Taking? Authorizing Provider  acetaminophen (TYLENOL) 500 MG tablet Take 250-500 mg by mouth daily as needed for mild pain or moderate pain. Reported on 04/11/2016   Yes Historical Provider, MD  albuterol (PROVENTIL) (2.5 MG/3ML) 0.083% nebulizer solution Take 2.5 mg by nebulization 4 (four) times daily.    Yes Historical Provider, MD  aspirin EC 81 MG tablet Take 81 mg by mouth daily.   Yes Historical Provider, MD  budesonide-formoterol (SYMBICORT) 160-4.5 MCG/ACT inhaler Inhale 2 puffs into the lungs 2 (two) times daily.    Yes Historical Provider, MD  citalopram (CELEXA) 20 MG tablet Take 20  mg by mouth daily.    Yes Historical Provider, MD  diazepam (VALIUM) 10 MG tablet Take 10 mg by mouth 3 (three) times daily as needed for anxiety.   Yes Historical Provider, MD  doxycycline (VIBRAMYCIN) 100 MG capsule Take 1 capsule (100 mg total) by mouth 2 (two) times daily. 05/22/16  Yes Ivery Quale, PA-C  gabapentin (NEURONTIN) 100 MG capsule Take 100 mg by mouth 5 (five) times daily.  09/06/15  Yes Historical Provider, MD  meclizine (ANTIVERT) 25 MG tablet Take 1 tablet (25 mg total) by mouth 3 (three) times daily as needed for dizziness. 05/14/16  Yes Ivery Quale, PA-C   oxyCODONE-acetaminophen (PERCOCET) 10-325 MG tablet Take 1 tablet by mouth every 6 (six) hours as needed for pain.  12/21/15  Yes Historical Provider, MD  tiotropium (SPIRIVA) 18 MCG inhalation capsule Place 18 mcg into inhaler and inhale every evening.    Yes Historical Provider, MD  torsemide (DEMADEX) 20 MG tablet Take 1 tablet (20 mg total) by mouth 2 (two) times daily. 11/12/15  Yes Kari Baars, MD  amoxicillin-clavulanate (AUGMENTIN) 500-125 MG tablet Take 1 tablet (500 mg total) by mouth 3 (three) times daily. Patient not taking: Reported on 05/14/2016 04/16/16   Avon Gully, MD  loratadine (CLARITIN) 10 MG tablet Take 1 tablet (10 mg total) by mouth daily. Patient not taking: Reported on 05/30/2016 05/22/16   Ivery Quale, PA-C  predniSONE (STERAPRED UNI-PAK 21 TAB) 10 MG (21) TBPK tablet Take 1 tablet (10 mg total) by mouth daily. 4 tab po daily for 3 days, then 3 tab po daily for 3 days, then 2 tab po daily for 3 days, then 1 tab po daily for 3 days. Patient not taking: Reported on 05/14/2016 04/16/16   Avon Gully, MD  triamcinolone ointment (KENALOG) 0.5 % Apply 1 application topically 2 (two) times daily. Patient not taking: Reported on 05/30/2016 05/22/16   Ivery Quale, PA-C   BP 122/79 mmHg  Pulse 78  Temp(Src) 98 F (36.7 C) (Oral)  Resp 20  Ht  (1.549 m)  Wt 122 lb (55.339 kg)  BMI 23.06 kg/m2  SpO2 95% Physical Exam  Constitutional: She is oriented to person, place, and time. She appears well-developed and well-nourished.  HENT:  Head: Normocephalic and atraumatic.  Eyes: Conjunctivae are normal. Pupils are equal, round, and reactive to light.  Cardiovascular: Normal rate, regular rhythm and normal heart sounds.   2+ ulna and radial pulses bilaterally  Pulmonary/Chest: Effort normal and breath sounds normal. No respiratory distress.  Abdominal: Soft. Bowel sounds are normal. She exhibits no distension.  Musculoskeletal: Normal range of motion.   Erythema/ecchymosis right lateral upper humerus, TTP. Pain with all ROM. No tenderness to elbow or right wrist.   Neurological: She is alert and oriented to person, place, and time.  Skin: Skin is warm and dry.  Psychiatric: She has a normal mood and affect. Her behavior is normal.  Nursing note and vitals reviewed.   ED Course  Procedures  DIAGNOSTIC STUDIES: Oxygen Saturation is 95% on RA, adequate by my interpretation.    COORDINATION OF CARE: 10:50 AM Discussed treatment plan with pt at bedside which includes x-ray and pt agreed to plan.  Labs Review Labs Reviewed - No data to display  Imaging Review Dg Shoulder Right  05/30/2016  CLINICAL DATA:  Pain and bruising.  No history of recent trauma EXAM: RIGHT SHOULDER - 2+ VIEW COMPARISON:  None. FINDINGS: Frontal, Y scapular, and axillary images obtained. There is  no demonstrable acute fracture or dislocation. There is moderate generalized osteoarthritic change. No erosive change. No bony destruction. Visualized right lung clear. IMPRESSION: Moderate generalized osteoarthritic change. No fracture or dislocation. No erosion. Electronically Signed   By: Bretta Bang III M.D.   On: 05/30/2016 11:14   I have personally reviewed and evaluated these images and lab results as part of my medical decision-making.   EKG Interpretation None      MDM   Final diagnoses:  Arthritis of right shoulder region  Arm pain, right  recurrent right upper arm pain, no signs of infection, xray arthritis.  Fup with ortho.  Pain meds in ED.  Results and differential diagnosis were discussed with the patient/parent/guardian. Xrays were independently reviewed by myself.  Close follow up outpatient was discussed, comfortable with the plan.   Medications  HYDROcodone-acetaminophen (NORCO/VICODIN) 5-325 MG per tablet 2 tablet (2 tablets Oral Given 05/30/16 1050)    Filed Vitals:   05/30/16 1003  BP: 122/79  Pulse: 78  Temp: 98 F (36.7 C)   TempSrc: Oral  Resp: 20  Height: 5\' 1"  (1.549 m)  Weight: 122 lb (55.339 kg)  SpO2: 95%    Final diagnoses:  Arthritis of right shoulder region  Arm pain, right      Melanie Ohara, MD 05/30/16 1205

## 2016-05-30 NOTE — Discharge Instructions (Signed)
If you were given medicines take as directed.  If you are on coumadin or contraceptives realize their levels and effectiveness is altered by many different medicines.  If you have any reaction (rash, tongues swelling, other) to the medicines stop taking and see a physician.    If your blood pressure was elevated in the ER make sure you follow up for management with a primary doctor or return for chest pain, shortness of breath or stroke symptoms.  Please follow up as directed and return to the ER or see a physician for new or worsening symptoms.  Thank you. Filed Vitals:   05/30/16 1003  BP: 122/79  Pulse: 78  Temp: 98 F (36.7 C)  TempSrc: Oral  Resp: 20  Height: 5\' 1"  (1.549 m)  Weight: 122 lb (55.339 kg)  SpO2: 95%

## 2016-05-30 NOTE — ED Notes (Signed)
Pt's daughter in law reports pt got a bruise to r shoulder while in the hospital in April.  Reports the bruise had almost completely healed then this past weekend it turned dark again and pt c/o pain.  Pt says feels like something is sticking her.  Pt also reports swelling and bruising to left foot and ankle.  Pedal pulse palpable, denies pain in foot or ankle.  Pt on home 02 at 3 liters.

## 2016-06-06 ENCOUNTER — Emergency Department (HOSPITAL_COMMUNITY): Payer: Commercial Managed Care - HMO

## 2016-06-06 ENCOUNTER — Encounter (HOSPITAL_COMMUNITY): Payer: Self-pay | Admitting: Emergency Medicine

## 2016-06-06 ENCOUNTER — Observation Stay (HOSPITAL_COMMUNITY)
Admission: EM | Admit: 2016-06-06 | Discharge: 2016-06-07 | Disposition: A | Discharge status: Home / Self Care | Payer: Commercial Managed Care - HMO | Attending: Pulmonary Disease | Admitting: Pulmonary Disease

## 2016-06-06 DIAGNOSIS — R42 Dizziness and giddiness: Secondary | ICD-10-CM | POA: Insufficient documentation

## 2016-06-06 DIAGNOSIS — I509 Heart failure, unspecified: Secondary | ICD-10-CM | POA: Diagnosis not present

## 2016-06-06 DIAGNOSIS — M549 Dorsalgia, unspecified: Secondary | ICD-10-CM | POA: Diagnosis not present

## 2016-06-06 DIAGNOSIS — J449 Chronic obstructive pulmonary disease, unspecified: Secondary | ICD-10-CM | POA: Diagnosis not present

## 2016-06-06 DIAGNOSIS — Z79891 Long term (current) use of opiate analgesic: Secondary | ICD-10-CM | POA: Insufficient documentation

## 2016-06-06 DIAGNOSIS — Z79899 Other long term (current) drug therapy: Secondary | ICD-10-CM | POA: Insufficient documentation

## 2016-06-06 DIAGNOSIS — F419 Anxiety disorder, unspecified: Secondary | ICD-10-CM | POA: Diagnosis present

## 2016-06-06 DIAGNOSIS — I714 Abdominal aortic aneurysm, without rupture, unspecified: Secondary | ICD-10-CM | POA: Diagnosis present

## 2016-06-06 DIAGNOSIS — R51 Headache: Secondary | ICD-10-CM | POA: Diagnosis not present

## 2016-06-06 DIAGNOSIS — F329 Major depressive disorder, single episode, unspecified: Secondary | ICD-10-CM | POA: Diagnosis not present

## 2016-06-06 DIAGNOSIS — Z87891 Personal history of nicotine dependence: Secondary | ICD-10-CM | POA: Insufficient documentation

## 2016-06-06 DIAGNOSIS — Z66 Do not resuscitate: Secondary | ICD-10-CM | POA: Diagnosis present

## 2016-06-06 DIAGNOSIS — I110 Hypertensive heart disease with heart failure: Secondary | ICD-10-CM | POA: Insufficient documentation

## 2016-06-06 DIAGNOSIS — M1990 Unspecified osteoarthritis, unspecified site: Secondary | ICD-10-CM | POA: Diagnosis not present

## 2016-06-06 DIAGNOSIS — R079 Chest pain, unspecified: Principal | ICD-10-CM | POA: Diagnosis present

## 2016-06-06 DIAGNOSIS — I252 Old myocardial infarction: Secondary | ICD-10-CM | POA: Insufficient documentation

## 2016-06-06 DIAGNOSIS — Z7982 Long term (current) use of aspirin: Secondary | ICD-10-CM | POA: Diagnosis not present

## 2016-06-06 DIAGNOSIS — Z8673 Personal history of transient ischemic attack (TIA), and cerebral infarction without residual deficits: Secondary | ICD-10-CM | POA: Insufficient documentation

## 2016-06-06 DIAGNOSIS — R0602 Shortness of breath: Secondary | ICD-10-CM | POA: Diagnosis not present

## 2016-06-06 DIAGNOSIS — J9611 Chronic respiratory failure with hypoxia: Secondary | ICD-10-CM | POA: Diagnosis present

## 2016-06-06 LAB — CBC WITH DIFFERENTIAL/PLATELET
BASOS ABS: 0 10*3/uL (ref 0.0–0.1)
BASOS PCT: 0 %
EOS PCT: 4 %
Eosinophils Absolute: 0.2 10*3/uL (ref 0.0–0.7)
HEMATOCRIT: 36 % (ref 36.0–46.0)
Hemoglobin: 11.9 g/dL — ABNORMAL LOW (ref 12.0–15.0)
LYMPHS PCT: 29 %
Lymphs Abs: 1.4 10*3/uL (ref 0.7–4.0)
MCH: 30.7 pg (ref 26.0–34.0)
MCHC: 33.1 g/dL (ref 30.0–36.0)
MCV: 93 fL (ref 78.0–100.0)
Monocytes Absolute: 0.4 10*3/uL (ref 0.1–1.0)
Monocytes Relative: 8 %
NEUTROS ABS: 2.8 10*3/uL (ref 1.7–7.7)
Neutrophils Relative %: 59 %
PLATELETS: 219 10*3/uL (ref 150–400)
RBC: 3.87 MIL/uL (ref 3.87–5.11)
RDW: 14 % (ref 11.5–15.5)
WBC: 4.8 10*3/uL (ref 4.0–10.5)

## 2016-06-06 LAB — COMPREHENSIVE METABOLIC PANEL
ALT: 9 U/L — AB (ref 14–54)
AST: 18 U/L (ref 15–41)
Albumin: 3.6 g/dL (ref 3.5–5.0)
Alkaline Phosphatase: 66 U/L (ref 38–126)
Anion gap: 8 (ref 5–15)
BUN: 29 mg/dL — AB (ref 6–20)
CHLORIDE: 100 mmol/L — AB (ref 101–111)
CO2: 28 mmol/L (ref 22–32)
Calcium: 8.8 mg/dL — ABNORMAL LOW (ref 8.9–10.3)
Creatinine, Ser: 1.27 mg/dL — ABNORMAL HIGH (ref 0.44–1.00)
GFR, EST AFRICAN AMERICAN: 42 mL/min — AB (ref >60)
GFR, EST NON AFRICAN AMERICAN: 36 mL/min — AB (ref >60)
Glucose, Bld: 167 mg/dL — ABNORMAL HIGH (ref 65–99)
POTASSIUM: 3.2 mmol/L — AB (ref 3.5–5.1)
SODIUM: 136 mmol/L (ref 135–145)
Total Bilirubin: 0.5 mg/dL (ref 0.3–1.2)
Total Protein: 6.7 g/dL (ref 6.5–8.1)

## 2016-06-06 LAB — I-STAT TROPONIN, ED: TROPONIN I, POC: 0.01 ng/mL (ref 0.00–0.08)

## 2016-06-06 MED ORDER — ONDANSETRON HCL 4 MG/2ML IJ SOLN
4.0000 mg | Freq: Once | INTRAMUSCULAR | Status: AC
  Administered 2016-06-06: 4 mg via INTRAVENOUS
  Filled 2016-06-06: qty 2

## 2016-06-06 MED ORDER — SODIUM CHLORIDE 0.9 % IV SOLN
INTRAVENOUS | Status: DC
  Administered 2016-06-06: 22:00:00 via INTRAVENOUS

## 2016-06-06 MED ORDER — ASPIRIN 81 MG PO CHEW
324.0000 mg | CHEWABLE_TABLET | Freq: Once | ORAL | Status: AC
  Administered 2016-06-06: 324 mg via ORAL
  Filled 2016-06-06: qty 4

## 2016-06-06 NOTE — ED Notes (Signed)
Pt reports having dizziness intermittently for 1 month. Pt states about 2 hours ago she started having mid chest pain and SOB. Pt c/o the room spinning.

## 2016-06-06 NOTE — ED Provider Notes (Signed)
CSN: [id number]     Arrival date & time 06/06/16  2050 History  By signing my name below, I, Melanie Lowe and Melanie Lowe, attest that this documentation has been prepared under the direction and in the presence of [name], MD. Electronically Signed: Doreatha Lowe and Melanie Lowe, ED Scribe. 06/06/2016. 10:04 PM.     Chief Complaint  Patient presents with  . Shortness of Breath   Patient is a 82 y.o. female presenting with dizziness. The history is provided by the patient. No language interpreter was used.  Dizziness Quality:  Room spinning Severity:  Moderate Onset quality:  Gradual Duration:  2 hours Timing:  Constant Progression:  Unchanged Chronicity:  Recurrent Context: eye movement and head movement   Relieved by:  Nothing Ineffective treatments:  Lying down and medication Associated symptoms: chest pain, headaches and shortness of breath   Associated symptoms: no diarrhea, no nausea, no vision changes and no vomiting   Chest pain:    Severity:  Moderate   Onset quality:  Gradual   Duration:  2 hours   Progression:  Resolved   Chronicity:  New   HPI Comments: Melanie Lowe is a 82 y.o. female who presents to the Emergency Department with a PMHx of MI, AAA, and cardiac stents complaining moderate dizziness, described as room-spinning, onset 7:30 pm. The patient also reports an episode of left-sided 8/10 CP onset at 7PM, which has since resolved. She notes that during this episode, she also experienced SOB and right-sided HA. There were no modifying factors listed for her CP. Per pt, her dizziness is exacerbated with ocular movement. She reports frequent h/o similar dizziness, for which she is prescribed Meclizine. Pt states she saw no relief after taking Meclizine this evening. The pt also has associated symptoms of back pain, which is baseline and unchanged this evening. The pt denies fevers, chills, vision changes, abdominal tenderness, nausea, vomiting, diarrhea,  hematuria, cough, rhinorrhea, sore throat, leg swelling rash. Pt is not taking any blood thinners. The patient is on 3L of oxygen at home.  Past Medical History  Diagnosis Date  . COPD (chronic obstructive pulmonary disease) (HCC)   . Hypertension   . Anxiety   . Depression   . Arthritis   . AAA (abdominal aortic aneurysm) (HCC)   . Neuropathy (HCC)   . Leg swelling   . Oxygen dependent   . Myocardial infarction (HCC)   . Pneumonia   . Stroke (HCC)   . CHF (congestive heart failure) (HCC) 11/09/2015   Past Surgical History  Procedure Laterality Date  . Cholecystectomy    . Appendectomy     Family History  Problem Relation Age of Onset  . Diabetes Brother   . Cancer Brother     Throat cancer, Heart attack  . Heart disease Brother   . Heart attack Brother   . Cancer Father   . Cancer Sister    Social History  Substance Use Topics  . Smoking status: Former Smoker -- 50 years    Types: Cigarettes    Quit date: 12/31/2010  . Smokeless tobacco: Former Neurosurgeon    Quit date: 12/31/2010  . Alcohol Use: No   OB History    Gravida Para Term Preterm AB TAB SAB Ectopic Multiple Living   5 5 5             Review of Systems  Constitutional: Negative for fever and chills.  Eyes: Negative for visual disturbance.  Respiratory: Positive for shortness of  breath.   Cardiovascular: Positive for chest pain. Negative for leg swelling.  Gastrointestinal: Negative for nausea, vomiting and diarrhea.  Genitourinary: Negative for hematuria.  Musculoskeletal: Positive for back pain.  Neurological: Positive for dizziness and headaches.    Allergies  Ciprofloxacin hcl; Nsaids; Other; and Tomato  Home Medications   Prior to Admission medications   Medication Sig Start Date End Date Taking? Authorizing Provider  acetaminophen (TYLENOL) 500 MG tablet Take 250-500 mg by mouth daily as needed for mild pain or moderate pain. Reported on 04/11/2016   Yes Historical Provider, MD  albuterol  (PROVENTIL) (2.5 MG/3ML) 0.083% nebulizer solution Take 2.5 mg by nebulization 4 (four) times daily.    Yes Historical Provider, MD  aspirin EC 81 MG tablet Take 81 mg by mouth daily.   Yes Historical Provider, MD  budesonide-formoterol (SYMBICORT) 160-4.5 MCG/ACT inhaler Inhale 2 puffs into the lungs 2 (two) times daily.    Yes Historical Provider, MD  citalopram (CELEXA) 20 MG tablet Take 20 mg by mouth at bedtime.    Yes Historical Provider, MD  diazepam (VALIUM) 10 MG tablet Take 10 mg by mouth 3 (three) times daily as needed for anxiety.   Yes Historical Provider, MD  gabapentin (NEURONTIN) 100 MG capsule Take 100 mg by mouth 5 (five) times daily.  09/06/15  Yes Historical Provider, MD  meclizine (ANTIVERT) 25 MG tablet Take 1 tablet (25 mg total) by mouth 3 (three) times daily as needed for dizziness. 05/14/16  Yes Ivery Quale, PA-C  oxyCODONE-acetaminophen (PERCOCET) 10-325 MG tablet Take 1 tablet by mouth every 6 (six) hours as needed for pain.  12/21/15  Yes Historical Provider, MD  tiotropium (SPIRIVA) 18 MCG inhalation capsule Place 18 mcg into inhaler and inhale every evening.    Yes Historical Provider, MD  torsemide (DEMADEX) 20 MG tablet Take 1 tablet (20 mg total) by mouth 2 (two) times daily. 11/12/15  Yes Kari Baars, MD  amoxicillin-clavulanate (AUGMENTIN) 500-125 MG tablet Take 1 tablet (500 mg total) by mouth 3 (three) times daily. Patient not taking: Reported on 05/14/2016 04/16/16   Avon Gully, MD  doxycycline (VIBRAMYCIN) 100 MG capsule Take 1 capsule (100 mg total) by mouth 2 (two) times daily. Patient not taking: Reported on 06/06/2016 05/22/16   Ivery Quale, PA-C  loratadine (CLARITIN) 10 MG tablet Take 1 tablet (10 mg total) by mouth daily. Patient not taking: Reported on 05/30/2016 05/22/16   Ivery Quale, PA-C  predniSONE (STERAPRED UNI-PAK 21 TAB) 10 MG (21) TBPK tablet Take 1 tablet (10 mg total) by mouth daily. 4 tab po daily for 3 days, then 3 tab po daily for 3  days, then 2 tab po daily for 3 days, then 1 tab po daily for 3 days. Patient not taking: Reported on 05/14/2016 04/16/16   Avon Gully, MD  triamcinolone ointment (KENALOG) 0.5 % Apply 1 application topically 2 (two) times daily. Patient not taking: Reported on 05/30/2016 05/22/16   Ivery Quale, PA-C   BP 116/77 mmHg  Pulse 52  Resp 15  SpO2 100% Physical Exam  Constitutional: She is oriented to person, place, and time. She appears well-developed and well-nourished.  HENT:  Head: Normocephalic.  Mouth/Throat: Oropharynx is clear and moist.  Eyes: Conjunctivae and EOM are normal. Pupils are equal, round, and reactive to light. No scleral icterus.  Cardiovascular: Normal rate, regular rhythm and normal heart sounds.   No murmur heard. Pulmonary/Chest: Effort normal and breath sounds normal. No respiratory distress.  O2 Sat is 98%.  Breath sounds clear and equal bilaterally.   Abdominal: Bowel sounds are normal. She exhibits no distension. There is no tenderness.  Musculoskeletal: Normal range of motion. She exhibits edema (Ankle).  Neurological: She is alert and oriented to person, place, and time. No cranial nerve deficit. She exhibits normal muscle tone. Coordination normal.  Skin: Skin is warm and dry.  Psychiatric: She has a normal mood and affect. Her behavior is normal.  Nursing note and vitals reviewed.   ED Course  Procedures (including critical care time) DIAGNOSTIC STUDIES: Oxygen Saturation is 98% on [place] 3L, normal by my interpretation.    COORDINATION OF CARE: 9:21 PM Discussed treatment plan with pt at bedside which includes a Chest X-Ray and pt agreed to plan.   Labs Review Labs Reviewed  CBC WITH DIFFERENTIAL/PLATELET - Abnormal; Notable for the following:    Hemoglobin 11.9 (*)    All other components within normal limits  COMPREHENSIVE METABOLIC PANEL - Abnormal; Notable for the following:    Potassium 3.2 (*)    Chloride 100 (*)    Glucose, Bld 167 (*)     BUN 29 (*)    Creatinine, Ser 1.27 (*)    Calcium 8.8 (*)    ALT 9 (*)    GFR calc non Af Amer 36 (*)    GFR calc Af Amer 42 (*)    All other components within normal limits  I-STAT TROPOININ, ED   Results for orders placed or performed during the hospital encounter of 06/06/16  CBC with Differential/Platelet  Result Value Ref Range   WBC 4.8 4.0 - 10.5 K/uL   RBC 3.87 3.87 - 5.11 MIL/uL   Hemoglobin 11.9 (L) 12.0 - 15.0 g/dL   HCT 51.0 25.8 - 52.7 %   MCV 93.0 78.0 - 100.0 fL   MCH 30.7 26.0 - 34.0 pg   MCHC 33.1 30.0 - 36.0 g/dL   RDW 78.2 42.3 - 53.6 %   Platelets 219 150 - 400 K/uL   Neutrophils Relative % 59 %   Neutro Abs 2.8 1.7 - 7.7 K/uL   Lymphocytes Relative 29 %   Lymphs Abs 1.4 0.7 - 4.0 K/uL   Monocytes Relative 8 %   Monocytes Absolute 0.4 0.1 - 1.0 K/uL   Eosinophils Relative 4 %   Eosinophils Absolute 0.2 0.0 - 0.7 K/uL   Basophils Relative 0 %   Basophils Absolute 0.0 0.0 - 0.1 K/uL  Comprehensive metabolic panel  Result Value Ref Range   Sodium 136 135 - 145 mmol/L   Potassium 3.2 (L) 3.5 - 5.1 mmol/L   Chloride 100 (L) 101 - 111 mmol/L   CO2 28 22 - 32 mmol/L   Glucose, Bld 167 (H) 65 - 99 mg/dL   BUN 29 (H) 6 - 20 mg/dL   Creatinine, Ser 1.44 (H) 0.44 - 1.00 mg/dL   Calcium 8.8 (L) 8.9 - 10.3 mg/dL   Total Protein 6.7 6.5 - 8.1 g/dL   Albumin 3.6 3.5 - 5.0 g/dL   AST 18 15 - 41 U/L   ALT 9 (L) 14 - 54 U/L   Alkaline Phosphatase 66 38 - 126 U/L   Total Bilirubin 0.5 0.3 - 1.2 mg/dL   GFR calc non Af Amer 36 (L) >60 mL/min   GFR calc Af Amer 42 (L) >60 mL/min   Anion gap 8 5 - 15  I-Stat Troponin, ED (not at [facility])  Result Value Ref Range   Troponin i, poc 0.01 0.00 -  0.08 ng/mL   Comment 3             Imaging Review Dg Chest 2 View  06/06/2016  CLINICAL DATA:  82 year old female with shortness breath chest pain EXAM: CHEST  2 VIEW COMPARISON:  Chest radiograph dated 05/14/2016 FINDINGS: Two views of the chest demonstrate emphysematous  changes of the lungs with increased thoracic AP diameter. Bibasilar linear atelectasis/ scarring noted. There is no focal consolidation, pleural effusion, or pneumothorax. The cardiac silhouette is within normal limits. The aorta is tortuous. Atherosclerotic calcification of the aortic knob. There is advanced osteopenia, scoliosis with degenerative changes of the spine. No acute osseous pathology. IMPRESSION: No active cardiopulmonary disease. Electronically Signed   By: Elgie Collard M.D.   On: 06/06/2016 22:35   Ct Head Wo Contrast  06/06/2016  CLINICAL DATA:  82 year old female with dizziness. EXAM: CT HEAD WITHOUT CONTRAST TECHNIQUE: Contiguous axial images were obtained from the base of the skull through the vertex without intravenous contrast. COMPARISON:  Head CT dated 05/14/2016 FINDINGS: There is mild age-related atrophy and chronic microvascular ischemic changes. A stable appearing old infarct noted in the right lentiform nucleus. There is no acute intracranial hemorrhage. No mass effect or midline shift noted. The visualized paranasal sinuses and mastoid air cells are clear. The calvarium is intact. IMPRESSION: No acute intracranial hemorrhage. Mild age-related atrophy and chronic microvascular ischemic disease. If symptoms persist and there are no contraindications, MRI may provide better evaluation if clinically indicated Electronically Signed   By: Elgie Collard M.D.   On: 06/06/2016 22:59   I have personally reviewed and evaluated these images and lab results as part of my medical decision-making.  Results for orders placed or performed during the hospital encounter of 06/06/16  CBC with Differential/Platelet  Result Value Ref Range   WBC 4.8 4.0 - 10.5 K/uL   RBC 3.87 3.87 - 5.11 MIL/uL   Hemoglobin 11.9 (L) 12.0 - 15.0 g/dL   HCT 40.9 81.1 - 91.4 %   MCV 93.0 78.0 - 100.0 fL   MCH 30.7 26.0 - 34.0 pg   MCHC 33.1 30.0 - 36.0 g/dL   RDW 78.2 95.6 - 21.3 %   Platelets 219 150 -  400 K/uL   Neutrophils Relative % 59 %   Neutro Abs 2.8 1.7 - 7.7 K/uL   Lymphocytes Relative 29 %   Lymphs Abs 1.4 0.7 - 4.0 K/uL   Monocytes Relative 8 %   Monocytes Absolute 0.4 0.1 - 1.0 K/uL   Eosinophils Relative 4 %   Eosinophils Absolute 0.2 0.0 - 0.7 K/uL   Basophils Relative 0 %   Basophils Absolute 0.0 0.0 - 0.1 K/uL  Comprehensive metabolic panel  Result Value Ref Range   Sodium 136 135 - 145 mmol/L   Potassium 3.2 (L) 3.5 - 5.1 mmol/L   Chloride 100 (L) 101 - 111 mmol/L   CO2 28 22 - 32 mmol/L   Glucose, Bld 167 (H) 65 - 99 mg/dL   BUN 29 (H) 6 - 20 mg/dL   Creatinine, Ser 0.86 (H) 0.44 - 1.00 mg/dL   Calcium 8.8 (L) 8.9 - 10.3 mg/dL   Total Protein 6.7 6.5 - 8.1 g/dL   Albumin 3.6 3.5 - 5.0 g/dL   AST 18 15 - 41 U/L   ALT 9 (L) 14 - 54 U/L   Alkaline Phosphatase 66 38 - 126 U/L   Total Bilirubin 0.5 0.3 - 1.2 mg/dL   GFR calc non Af Amer 36 (  L) >60 mL/min   GFR calc Af Amer 42 (L) >60 mL/min   Anion gap 8 5 - 15  I-Stat Troponin, ED (not at [facility])  Result Value Ref Range   Troponin i, poc 0.01 0.00 - 0.08 ng/mL   Comment 3           Dg Chest 2 View  06/06/2016  CLINICAL DATA:  82 year old female with shortness breath chest pain EXAM: CHEST  2 VIEW COMPARISON:  Chest radiograph dated 05/14/2016 FINDINGS: Two views of the chest demonstrate emphysematous changes of the lungs with increased thoracic AP diameter. Bibasilar linear atelectasis/ scarring noted. There is no focal consolidation, pleural effusion, or pneumothorax. The cardiac silhouette is within normal limits. The aorta is tortuous. Atherosclerotic calcification of the aortic knob. There is advanced osteopenia, scoliosis with degenerative changes of the spine. No acute osseous pathology. IMPRESSION: No active cardiopulmonary disease. Electronically Signed   By: Elgie Collard M.D.   On: 06/06/2016 22:35   Dg Shoulder Right  05/30/2016  CLINICAL DATA:  Pain and bruising.  No history of recent trauma EXAM:  RIGHT SHOULDER - 2+ VIEW COMPARISON:  None. FINDINGS: Frontal, Y scapular, and axillary images obtained. There is no demonstrable acute fracture or dislocation. There is moderate generalized osteoarthritic change. No erosive change. No bony destruction. Visualized right lung clear. IMPRESSION: Moderate generalized osteoarthritic change. No fracture or dislocation. No erosion. Electronically Signed   By: Bretta Bang III M.D.   On: 05/30/2016 11:14   Ct Head Wo Contrast  06/06/2016  CLINICAL DATA:  82 year old female with dizziness. EXAM: CT HEAD WITHOUT CONTRAST TECHNIQUE: Contiguous axial images were obtained from the base of the skull through the vertex without intravenous contrast. COMPARISON:  Head CT dated 05/14/2016 FINDINGS: There is mild age-related atrophy and chronic microvascular ischemic changes. A stable appearing old infarct noted in the right lentiform nucleus. There is no acute intracranial hemorrhage. No mass effect or midline shift noted. The visualized paranasal sinuses and mastoid air cells are clear. The calvarium is intact. IMPRESSION: No acute intracranial hemorrhage. Mild age-related atrophy and chronic microvascular ischemic disease. If symptoms persist and there are no contraindications, MRI may provide better evaluation if clinically indicated Electronically Signed   By: Elgie Collard M.D.   On: 06/06/2016 22:59   Ct Head Wo Contrast  05/14/2016  CLINICAL DATA:  Dizziness, visual changes and history of stroke. EXAM: CT HEAD WITHOUT CONTRAST TECHNIQUE: Contiguous axial images were obtained from the base of the skull through the vertex without intravenous contrast. COMPARISON:  11/11/2014 FINDINGS: Stable moderately advanced small vessel disease in the periventricular white matter and old lacunar infarct of the right basal ganglia. The brain demonstrates no evidence of hemorrhage, acute infarction, edema, mass effect, extra-axial fluid collection, hydrocephalus or mass lesion.  The skull is unremarkable. IMPRESSION: No acute findings. Stable small vessel disease and old right basal ganglia lacunar infarct. Electronically Signed   By: Irish Lack M.D.   On: 05/14/2016 13:10   Dg Chest Portable 1 View  05/14/2016  CLINICAL DATA:  Dizziness, shortness of breath and left-sided chest pain. EXAM: PORTABLE CHEST 1 VIEW COMPARISON:  04/11/2016 FINDINGS: Stable top-normal heart size. Stable tortuosity of the thoracic aorta. Increased an opacity at the right lung base is felt to most likely represent atelectasis. Stable pulmonary scarring bilaterally. There is no evidence of pulmonary edema, consolidation, pneumothorax, nodule or pleural fluid. Stable scoliosis. IMPRESSION: Opacity at the right lung base is felt to most likely represent atelectasis. Electronically  Signed   By: Irish Lack M.D.   On: 05/14/2016 11:32       EKG Interpretation   Date/Time:  Wednesday June 06 2016 21:07:14 EDT Ventricular Rate:  62 PR Interval:  240 QRS Duration: 102 QT Interval:  467 QTC Calculation: 474 R Axis:   37 Text Interpretation:  Sinus rhythm Prolonged PR interval No significant  change since last tracing Confirmed by Nene Aranas  MD, Jaslyne Beeck [phone]) on  06/06/2016 9:36:24 PM      MDM   Final diagnoses:  Chest pain, unspecified chest pain type    Patient with an hour and a half to 2 hours of chest pain resolved upon arrival here. No recurrent chest pain. First troponin negative. EKG without any acute findings. Compared to old. Chest x-ray negative for pneumonia pneumothorax or pulmonary edema.  Based on the history of congestive heart failure coronary artery disease. Will need chest pain rule out overnight. Patient is followed by Dr. Juanetta Gosling. Patient known to have bad COPD. Breathing currently comfortable normally on 3 L of oxygen. Oxygen sats are in the upper 90s.  I personally performed the services described in this documentation, which was scribed in my presence. The  recorded information has been reviewed and is accurate.     [name], MD 06/06/16 [phone]

## 2016-06-07 ENCOUNTER — Observation Stay (HOSPITAL_BASED_OUTPATIENT_CLINIC_OR_DEPARTMENT_OTHER): Payer: Commercial Managed Care - HMO

## 2016-06-07 ENCOUNTER — Encounter (HOSPITAL_COMMUNITY): Payer: Self-pay | Admitting: *Deleted

## 2016-06-07 DIAGNOSIS — R079 Chest pain, unspecified: Secondary | ICD-10-CM | POA: Diagnosis not present

## 2016-06-07 DIAGNOSIS — F419 Anxiety disorder, unspecified: Secondary | ICD-10-CM

## 2016-06-07 DIAGNOSIS — J9611 Chronic respiratory failure with hypoxia: Secondary | ICD-10-CM

## 2016-06-07 DIAGNOSIS — Z66 Do not resuscitate: Secondary | ICD-10-CM

## 2016-06-07 DIAGNOSIS — J449 Chronic obstructive pulmonary disease, unspecified: Secondary | ICD-10-CM | POA: Diagnosis not present

## 2016-06-07 LAB — TROPONIN I: Troponin I: <0.03 ng/mL (ref <0.031)

## 2016-06-07 LAB — ECHOCARDIOGRAM COMPLETE
AVPHT: 972 ms
E/e' ratio: 13.22
EWDT: 391 ms
FS: 32 % (ref 28–44)
HEIGHTINCHES: 61 in
IVS/LV PW RATIO, ED: 0.9
LA ID, A-P, ES: 33 mm
LA diam end sys: 33 mm
LA vol A4C: 49.7 ml
LA vol index: 27.9 mL/m2
LA vol: 43.3 mL
LADIAMINDEX: 2.13 cm/m2
LV E/e' medial: 13.22
LV TDI E'LATERAL: 5.87
LV TDI E'MEDIAL: 7.62
LV e' LATERAL: 5.87 cm/s
LV sys vol: 18 mL (ref 14–42)
LVDIAVOL: 47 mL (ref 46–106)
LVDIAVOLIN: 30 mL/m2
LVEEAVG: 13.22
LVOT area: 3.14 cm2
LVOTD: 20 mm
LVSYSVOLIN: 11 mL/m2
MV Dec: 391
MV Peak grad: 2 mmHg
MV pk E vel: 77.6 m/s
MVPKAVEL: 120 m/s
PW: 14.4 mm — AB (ref 0.6–1.1)
Reg peak vel: 294 cm/s
Simpson's disk: 63
Stroke v: 30 ml
TAPSE: 23.2 mm
TRMAXVEL: 294 cm/s
WEIGHTICAEL: 2000 [oz_av]

## 2016-06-07 MED ORDER — GI COCKTAIL ~~LOC~~: 30.0000 mL | Freq: Four times a day (QID) | ORAL | Status: DC | PRN

## 2016-06-07 MED ORDER — ISOSORBIDE MONONITRATE ER 30 MG PO TB24: 30.0000 mg | ORAL_TABLET | Freq: Every day | ORAL | Status: DC

## 2016-06-07 MED ORDER — ONDANSETRON HCL 4 MG/2ML IJ SOLN: 4.0000 mg | Freq: Four times a day (QID) | INTRAMUSCULAR | Status: DC | PRN

## 2016-06-07 MED ORDER — ACETAMINOPHEN 325 MG PO TABS
650.0000 mg | ORAL_TABLET | ORAL | Status: DC | PRN
  Administered 2016-06-07: 650 mg via ORAL
  Filled 2016-06-07: qty 2

## 2016-06-07 MED ORDER — ASPIRIN EC 325 MG PO TBEC
325.0000 mg | DELAYED_RELEASE_TABLET | Freq: Every day | ORAL | Status: DC
  Administered 2016-06-07: 325 mg via ORAL
  Filled 2016-06-07: qty 1

## 2016-06-07 NOTE — Consult Note (Signed)
   [facility] CM Inpatient Consult   06/07/2016  Melanie Lowe 05-31-1924 [id number]  Patient is currently active with [facility] Care Management for chronic disease management services.  Patient has been engaged by a [employer].  Our community based plan of care has focused on disease management and community resource support.  Patient will receive a post discharge transition of care call and will be evaluated for monthly home visits for assessments and disease process education.  Made Inpatient Case Manager aware that [facility] Care Management following. Of note, [facility] Care Management services does not replace or interfere with any services that are arranged by inpatient case management or social work.   For additional questions or referrals please contact:   Alben Spittle. [name], RN, BSN, [facility] [phone]) Business Cell  [phone]) Toll Free Office

## 2016-06-07 NOTE — H&P (Signed)
History and Physical    Melanie Lowe PJS:[id number] DOB: Sep 16, 1924 DOA: 06/06/2016  PCP: Fredirick Maudlin, MD  Patient coming from: home  Chief Complaint:  Chest pain  HPI: Melanie Lowe is a 82 y.o. female with h/o advanced COPD on 2-3 liters, chf comes in with 2 hours of sscp that radiated to the left breast that was constant, sharp and also in the epigastric area.  No associated n/v/diaphoresis.  Pt came to the ED and given zofran and aspirin unknown if she was given ntg and the pain resolved.  She does not have h/o GERD.  No cough.  No fevers.  No swelling in her legs.  Pt referred for admission for possible ACS.   Review of Systems: As per HPI otherwise 10 point review of systems negative.  No pleuritic nature to chest pain.  No hemoptysis.  Past Medical History  Diagnosis Date  . COPD (chronic obstructive pulmonary disease) (HCC)   . Hypertension   . Anxiety   . Depression   . Arthritis   . AAA (abdominal aortic aneurysm) (HCC)   . Neuropathy (HCC)   . Leg swelling   . Oxygen dependent   . Myocardial infarction (HCC)   . Pneumonia   . Stroke (HCC)   . CHF (congestive heart failure) (HCC) 11/09/2015    Past Surgical History  Procedure Laterality Date  . Cholecystectomy    . Appendectomy       reports that she quit smoking about 5 years ago. Her smoking use included Cigarettes. She quit after 50 years of use. She quit smokeless tobacco use about 5 years ago. She reports that she does not drink alcohol or use illicit drugs.  Allergies  Allergen Reactions  . Ciprofloxacin Hcl Nausea And Vomiting  . Nsaids     Per patients son due to AAA.  Can only take coated asprin  . Other     FOOD: Spice Triggers acid reflux  . Tomato     Triggers acid reflux    Family History  Problem Relation Age of Onset  . Diabetes Brother   . Cancer Brother     Throat cancer, Heart attack  . Heart disease Brother   . Heart attack Brother   . Cancer Father   . Cancer Sister      Prior to Admission medications   Medication Sig Start Date End Date Taking? Authorizing Provider  acetaminophen (TYLENOL) 500 MG tablet Take 250-500 mg by mouth daily as needed for mild pain or moderate pain. Reported on 04/11/2016   Yes Historical Provider, MD  albuterol (PROVENTIL) (2.5 MG/3ML) 0.083% nebulizer solution Take 2.5 mg by nebulization 4 (four) times daily.    Yes Historical Provider, MD  aspirin EC 81 MG tablet Take 81 mg by mouth daily.   Yes Historical Provider, MD  budesonide-formoterol (SYMBICORT) 160-4.5 MCG/ACT inhaler Inhale 2 puffs into the lungs 2 (two) times daily.    Yes Historical Provider, MD  citalopram (CELEXA) 20 MG tablet Take 20 mg by mouth at bedtime.    Yes Historical Provider, MD  diazepam (VALIUM) 10 MG tablet Take 10 mg by mouth 3 (three) times daily as needed for anxiety.   Yes Historical Provider, MD  gabapentin (NEURONTIN) 100 MG capsule Take 100 mg by mouth 5 (five) times daily.  09/06/15  Yes Historical Provider, MD  meclizine (ANTIVERT) 25 MG tablet Take 1 tablet (25 mg total) by mouth 3 (three) times daily as needed for dizziness. 05/14/16  Yes Ivery Quale, PA-C  oxyCODONE-acetaminophen (PERCOCET) 10-325 MG tablet Take 1 tablet by mouth every 6 (six) hours as needed for pain.  12/21/15  Yes Historical Provider, MD  tiotropium (SPIRIVA) 18 MCG inhalation capsule Place 18 mcg into inhaler and inhale every evening.    Yes Historical Provider, MD  torsemide (DEMADEX) 20 MG tablet Take 1 tablet (20 mg total) by mouth 2 (two) times daily. 11/12/15  Yes Kari Baars, MD  amoxicillin-clavulanate (AUGMENTIN) 500-125 MG tablet Take 1 tablet (500 mg total) by mouth 3 (three) times daily. Patient not taking: Reported on 05/14/2016 04/16/16   Avon Gully, MD  doxycycline (VIBRAMYCIN) 100 MG capsule Take 1 capsule (100 mg total) by mouth 2 (two) times daily. Patient not taking: Reported on 06/06/2016 05/22/16   Ivery Quale, PA-C  loratadine (CLARITIN) 10 MG tablet  Take 1 tablet (10 mg total) by mouth daily. Patient not taking: Reported on 05/30/2016 05/22/16   Ivery Quale, PA-C  predniSONE (STERAPRED UNI-PAK 21 TAB) 10 MG (21) TBPK tablet Take 1 tablet (10 mg total) by mouth daily. 4 tab po daily for 3 days, then 3 tab po daily for 3 days, then 2 tab po daily for 3 days, then 1 tab po daily for 3 days. Patient not taking: Reported on 05/14/2016 04/16/16   Avon Gully, MD  triamcinolone ointment (KENALOG) 0.5 % Apply 1 application topically 2 (two) times daily. Patient not taking: Reported on 05/30/2016 05/22/16   Ivery Quale, PA-C    Physical Exam: Filed Vitals:   06/06/16 2130 06/06/16 2200 06/06/16 2248 06/07/16 0044  BP: 105/66 102/66 116/77 132/74  Pulse: 59 58 52 50  Temp:    97.5 F (36.4 C)  TempSrc:    Oral  Resp: 19 15 15 16   Height:    5\' 1"  (1.549 m)  Weight:    56.7 kg (125 lb)  SpO2: 98% 99% 100% 100%      Constitutional: NAD, calm, comfortable Filed Vitals:   06/06/16 2130 06/06/16 2200 06/06/16 2248 06/07/16 0044  BP: 105/66 102/66 116/77 132/74  Pulse: 59 58 52 50  Temp:    97.5 F (36.4 C)  TempSrc:    Oral  Resp: 19 15 15 16   Height:    5\' 1"  (1.549 m)  Weight:    56.7 kg (125 lb)  SpO2: 98% 99% 100% 100%   Eyes: PERRL, lids and conjunctivae normal ENMT: Mucous membranes are moist. Posterior pharynx clear of any exudate or lesions.Normal dentition.  Neck: normal, supple, no masses, no thyromegaly Respiratory: clear to auscultation bilaterally, no wheezing, no crackles. Normal respiratory effort. No accessory muscle use.  Cardiovascular: Regular rate and rhythm, no murmurs / rubs / gallops. No extremity edema. 2+ pedal pulses. No carotid bruits.  Abdomen: no tenderness, no masses palpated. No hepatosplenomegaly. Bowel sounds positive.  Musculoskeletal: no clubbing / cyanosis. No joint deformity upper and lower extremities. Good ROM, no contractures. Normal muscle tone.  Skin: no rashes, lesions, ulcers. No  induration Neurologic: CN 2-12 grossly intact. Sensation intact, DTR normal. Strength 5/5 in all 4.  Psychiatric: Normal judgment and insight. Alert and oriented x 3. Normal mood.    Labs on Admission: I have personally reviewed following labs and imaging studies  CBC:  Recent Labs Lab 06/06/16 2100  WBC 4.8  NEUTROABS 2.8  HGB 11.9*  HCT 36.0  MCV 93.0  PLT 219   Basic Metabolic Panel:  Recent Labs Lab 06/06/16 2100  NA 136  K 3.2*  CL 100*  CO2 28  GLUCOSE 167*  BUN 29*  CREATININE 1.27*  CALCIUM 8.8*   GFR: Estimated Creatinine Clearance: 22.2 mL/min (by C-G formula based on Cr of 1.27). Liver Function Tests:  Recent Labs Lab 06/06/16 2100  AST 18  ALT 9*  ALKPHOS 66  BILITOT 0.5  PROT 6.7  ALBUMIN 3.6   Urine analysis:    Component Value Date/Time   COLORURINE YELLOW 05/14/2016 1100   APPEARANCEUR CLEAR 05/14/2016 1100   LABSPEC 1.010 05/14/2016 1100   PHURINE 6.0 05/14/2016 1100   GLUCOSEU NEGATIVE 05/14/2016 1100   HGBUR NEGATIVE 05/14/2016 1100   BILIRUBINUR NEGATIVE 05/14/2016 1100   KETONESUR NEGATIVE 05/14/2016 1100   PROTEINUR NEGATIVE 05/14/2016 1100   UROBILINOGEN 0.2 11/02/2015 2014   NITRITE NEGATIVE 05/14/2016 1100   LEUKOCYTESUR NEGATIVE 05/14/2016 1100    Radiological Exams on Admission: Dg Chest 2 View  06/06/2016  CLINICAL DATA:  82 year old female with shortness breath chest pain EXAM: CHEST  2 VIEW COMPARISON:  Chest radiograph dated 05/14/2016 FINDINGS: Two views of the chest demonstrate emphysematous changes of the lungs with increased thoracic AP diameter. Bibasilar linear atelectasis/ scarring noted. There is no focal consolidation, pleural effusion, or pneumothorax. The cardiac silhouette is within normal limits. The aorta is tortuous. Atherosclerotic calcification of the aortic knob. There is advanced osteopenia, scoliosis with degenerative changes of the spine. No acute osseous pathology. IMPRESSION: No active  cardiopulmonary disease. Electronically Signed   By: Elgie Collard M.D.   On: 06/06/2016 22:35   Ct Head Wo Contrast  06/06/2016  CLINICAL DATA:  82 year old female with dizziness. EXAM: CT HEAD WITHOUT CONTRAST TECHNIQUE: Contiguous axial images were obtained from the base of the skull through the vertex without intravenous contrast. COMPARISON:  Head CT dated 05/14/2016 FINDINGS: There is mild age-related atrophy and chronic microvascular ischemic changes. A stable appearing old infarct noted in the right lentiform nucleus. There is no acute intracranial hemorrhage. No mass effect or midline shift noted. The visualized paranasal sinuses and mastoid air cells are clear. The calvarium is intact. IMPRESSION: No acute intracranial hemorrhage. Mild age-related atrophy and chronic microvascular ischemic disease. If symptoms persist and there are no contraindications, MRI may provide better evaluation if clinically indicated Electronically Signed   By: Elgie Collard M.D.   On: 06/06/2016 22:59    EKG: Independently reviewed NSR  Assessment/Plan  82yo female with multiple medical problems with atypical chest pain  Principal Problem:   Chest pain- pt reports having this before but cant describe details.  romi with serial cardiac troponin q 6 hours and check cardiac echo in am.  PCP consider adding imdur is this is more of chronic issue.  Active Problems:   Abdominal aortic aneurysm (HCC)- stable, no abdominal pain.  Pain resolved at this time so doubt associated with anuerysm   Chronic respiratory failure with hypoxia (HCC)- stable on her 3 liters   COPD (chronic obstructive pulmonary disease) (HCC)- stable   DNR (do not resuscitate)- noted   CHF (congestive heart failure) (HCC)- compensated   Anxiety- noted   PCP is Dr. Juanetta Gosling.  obs on tele.      DVT prophylaxis:  SCD Code Status: DNR  DAVID,RACHAL A MD [facility]  If 7PM-7AM, please contact  night-coverage [email or web address] Password [email or web address]  06/07/2016, 1:00 AM

## 2016-06-07 NOTE — Progress Notes (Signed)
Patient discharged home this afternoon. No further complaints of pain or discomfort. Echo completed today prior to discharge. Discharge instructions reviewed with patient per M. Rubye Oaks, RN and pt left floor in stable condition via w/c accompanied by Janyce Llanos, RN this afternoon. Earnstine Regal, RN

## 2016-06-07 NOTE — Progress Notes (Signed)
She was admitted early this morning with chest discomfort. It was somewhat atypical. She's been having trouble with this off and on. She has severe arterial disease with previous MI heart failure and a nonoperable large abdominal aortic aneurysm. She has lost follow-up with cardiology.  Exam today shows she looks comfortable. Her chest is clear. Her heart is regular without gallop. Abdomen is soft. She has negative troponin now 3. No more chest pain.  She has ruled out for MI. Agree she needs echocardiogram. Once that's accomplished she can go home.

## 2016-06-07 NOTE — Progress Notes (Signed)
Patient states understanding of discharge instructions.  

## 2016-06-07 NOTE — Care Management Note (Addendum)
Case Management Note  Patient Details  Name: Melanie Lowe MRN: [id number] Date of Birth: May 19, 1924  Subjective/Objective:                  Pt is from home, lives with her son and is ind with ADL's. Pt states she has home O2, canes, walkers, wheelchairs, BSC. Pt plans to return home with self care today. Pt states she has a PCP, transportation to appointments and is able to afford her medications. She voices concern about the cost of Ensures. No HH services but is active with [facility], states Melanie Lowe calls regularly.   Action/Plan: Pt discharging home today. No CM needs.   Expected Discharge Date:    06/07/2016              Expected Discharge Plan:  Home/Self Care  In-House Referral:  NA  Discharge planning Services  CM Consult  Post Acute Care Choice:  NA Choice offered to:  NA  DME Arranged:    DME Agency:     HH Arranged:    HH Agency:     Status of Service:  Completed, signed off  Medicare Important Message Given:    Date Medicare IM Given:    Medicare IM give by:    Date Additional Medicare IM Given:    Additional Medicare Important Message give by:     If discussed at Long Length of Stay Meetings, dates discussed:    Additional Comments:  Melanie Metro, RN 06/07/2016, 9:09 AM

## 2016-06-07 NOTE — Care Management Obs Status (Signed)
MEDICARE OBSERVATION STATUS NOTIFICATION   Patient Details  Name: Melanie Lowe MRN: [id number] Date of Birth: 09/17/1924   Medicare Observation Status Notification Given:  Other (see comment) (Discharged <24 hrs)    Malcolm Metro, RN 06/07/2016, 8:12 AM

## 2016-06-07 NOTE — Discharge Summary (Signed)
Physician Discharge Summary  Patient ID: Melanie Lowe MRN: [id number] DOB/AGE: 03/17/1924 82 y.o. Primary Care Physician:Adelaine Roppolo L, MD Admit date: 06/06/2016 Discharge date: 06/07/2016    Discharge Diagnoses:   Principal Problem:   Chest pain Active Problems:   Abdominal aortic aneurysm (HCC)   Chronic respiratory failure with hypoxia (HCC)   COPD (chronic obstructive pulmonary disease) (HCC)   DNR (do not resuscitate)   CHF (congestive heart failure) (HCC)   Anxiety     Medication List    STOP taking these medications        amoxicillin-clavulanate 500-125 MG tablet  Commonly known as:  AUGMENTIN     doxycycline 100 MG capsule  Commonly known as:  VIBRAMYCIN     loratadine 10 MG tablet  Commonly known as:  CLARITIN     predniSONE 10 MG (21) Tbpk tablet  Commonly known as:  STERAPRED UNI-PAK 21 TAB     triamcinolone ointment 0.5 %  Commonly known as:  KENALOG      TAKE these medications        acetaminophen 500 MG tablet  Commonly known as:  TYLENOL  Take 250-500 mg by mouth daily as needed for mild pain or moderate pain. Reported on 04/11/2016     albuterol (2.5 MG/3ML) 0.083% nebulizer solution  Commonly known as:  PROVENTIL  Take 2.5 mg by nebulization 4 (four) times daily.     aspirin EC 81 MG tablet  Take 81 mg by mouth daily.     budesonide-formoterol 160-4.5 MCG/ACT inhaler  Commonly known as:  SYMBICORT  Inhale 2 puffs into the lungs 2 (two) times daily.     citalopram 20 MG tablet  Commonly known as:  CELEXA  Take 20 mg by mouth at bedtime.     diazepam 10 MG tablet  Commonly known as:  VALIUM  Take 10 mg by mouth 3 (three) times daily as needed for anxiety.     gabapentin 100 MG capsule  Commonly known as:  NEURONTIN  Take 100 mg by mouth 5 (five) times daily.     isosorbide mononitrate 30 MG 24 hr tablet  Commonly known as:  IMDUR  Take 1 tablet (30 mg total) by mouth daily.     meclizine 25 MG tablet  Commonly known as:   ANTIVERT  Take 1 tablet (25 mg total) by mouth 3 (three) times daily as needed for dizziness.     oxyCODONE-acetaminophen 10-325 MG tablet  Commonly known as:  PERCOCET  Take 1 tablet by mouth every 6 (six) hours as needed for pain.     tiotropium 18 MCG inhalation capsule  Commonly known as:  SPIRIVA  Place 18 mcg into inhaler and inhale every evening.     torsemide 20 MG tablet  Commonly known as:  DEMADEX  Take 1 tablet (20 mg total) by mouth 2 (two) times daily.        Discharged Condition:Improved    Consults: None  Significant Diagnostic Studies: Dg Chest 2 View  06/06/2016  CLINICAL DATA:  82 year old female with shortness breath chest pain EXAM: CHEST  2 VIEW COMPARISON:  Chest radiograph dated 05/14/2016 FINDINGS: Two views of the chest demonstrate emphysematous changes of the lungs with increased thoracic AP diameter. Bibasilar linear atelectasis/ scarring noted. There is no focal consolidation, pleural effusion, or pneumothorax. The cardiac silhouette is within normal limits. The aorta is tortuous. Atherosclerotic calcification of the aortic knob. There is advanced osteopenia, scoliosis with degenerative changes of the spine. No acute  osseous pathology. IMPRESSION: No active cardiopulmonary disease. Electronically Signed   By: Elgie Collard M.D.   On: 06/06/2016 22:35   Dg Shoulder Right  05/30/2016  CLINICAL DATA:  Pain and bruising.  No history of recent trauma EXAM: RIGHT SHOULDER - 2+ VIEW COMPARISON:  None. FINDINGS: Frontal, Y scapular, and axillary images obtained. There is no demonstrable acute fracture or dislocation. There is moderate generalized osteoarthritic change. No erosive change. No bony destruction. Visualized right lung clear. IMPRESSION: Moderate generalized osteoarthritic change. No fracture or dislocation. No erosion. Electronically Signed   By: Bretta Bang III M.D.   On: 05/30/2016 11:14   Ct Head Wo Contrast  06/06/2016  CLINICAL DATA:   82 year old female with dizziness. EXAM: CT HEAD WITHOUT CONTRAST TECHNIQUE: Contiguous axial images were obtained from the base of the skull through the vertex without intravenous contrast. COMPARISON:  Head CT dated 05/14/2016 FINDINGS: There is mild age-related atrophy and chronic microvascular ischemic changes. A stable appearing old infarct noted in the right lentiform nucleus. There is no acute intracranial hemorrhage. No mass effect or midline shift noted. The visualized paranasal sinuses and mastoid air cells are clear. The calvarium is intact. IMPRESSION: No acute intracranial hemorrhage. Mild age-related atrophy and chronic microvascular ischemic disease. If symptoms persist and there are no contraindications, MRI may provide better evaluation if clinically indicated Electronically Signed   By: Elgie Collard M.D.   On: 06/06/2016 22:59   Ct Head Wo Contrast  05/14/2016  CLINICAL DATA:  Dizziness, visual changes and history of stroke. EXAM: CT HEAD WITHOUT CONTRAST TECHNIQUE: Contiguous axial images were obtained from the base of the skull through the vertex without intravenous contrast. COMPARISON:  11/11/2014 FINDINGS: Stable moderately advanced small vessel disease in the periventricular white matter and old lacunar infarct of the right basal ganglia. The brain demonstrates no evidence of hemorrhage, acute infarction, edema, mass effect, extra-axial fluid collection, hydrocephalus or mass lesion. The skull is unremarkable. IMPRESSION: No acute findings. Stable small vessel disease and old right basal ganglia lacunar infarct. Electronically Signed   By: Irish Lack M.D.   On: 05/14/2016 13:10   Dg Chest Portable 1 View  05/14/2016  CLINICAL DATA:  Dizziness, shortness of breath and left-sided chest pain. EXAM: PORTABLE CHEST 1 VIEW COMPARISON:  04/11/2016 FINDINGS: Stable top-normal heart size. Stable tortuosity of the thoracic aorta. Increased an opacity at the right lung base is felt to  most likely represent atelectasis. Stable pulmonary scarring bilaterally. There is no evidence of pulmonary edema, consolidation, pneumothorax, nodule or pleural fluid. Stable scoliosis. IMPRESSION: Opacity at the right lung base is felt to most likely represent atelectasis. Electronically Signed   By: Irish Lack M.D.   On: 05/14/2016 11:32    Lab Results: Basic Metabolic Panel:  Recent Labs  16/10/96 2100  NA 136  K 3.2*  CL 100*  CO2 28  GLUCOSE 167*  BUN 29*  CREATININE 1.27*  CALCIUM 8.8*   Liver Function Tests:  Recent Labs  06/06/16 2100  AST 18  ALT 9*  ALKPHOS 66  BILITOT 0.5  PROT 6.7  ALBUMIN 3.6     CBC:  Recent Labs  06/06/16 2100  WBC 4.8  NEUTROABS 2.8  HGB 11.9*  HCT 36.0  MCV 93.0  PLT 219    No results found for this or any previous visit (from the past 240 hour(s)).   Hospital Course: This is a 82 year old with multiple medical problems including severe COPD previous history of MI very  large abdominal aortic aneurysm for which she is not felt to be an operative candidate peripheral arterial disease chronic pain and anxiety and depression. She came to the emergency department with chest discomfort. Her pain resolved and is not quite clear if it resolved from aspirin and Zofran or if she got nitroglycerin. At any rate her pain went away she was admitted for observation and had negative troponins 3 and her pain resolved. She has a history of an MI but has lost follow-up with cardiology.  Discharge Exam: Blood pressure 128/64, pulse 62, temperature 98.3 F (36.8 C), temperature source Oral, resp. rate 16, height  (1.549 m), weight 56.7 kg (125 lb), SpO2 97 %. She is awake and alert. She looks comfortable.  Disposition: She will be discharged home. Add Imdur to see if we can control her pain. She has basically wanted minimal interventions done but I will discuss with her as an outpatient if she wants cardiology consultation and  follow-up.      Discharge Instructions    Discharge patient    Complete by:  As directed   After echocardiogram done             Signed: Mohanad Carsten L   06/07/2016, 8:09 AM

## 2016-06-11 ENCOUNTER — Other Ambulatory Visit: Payer: Self-pay | Admitting: *Deleted

## 2016-06-11 ENCOUNTER — Encounter: Payer: Self-pay | Admitting: *Deleted

## 2016-06-11 DIAGNOSIS — R0902 Hypoxemia: Secondary | ICD-10-CM | POA: Diagnosis not present

## 2016-06-11 NOTE — Patient Outreach (Signed)
[facility]) Care Management   06/11/2016  Melanie Lowe Feb 19, 1924 [id number]  Melanie Lowe is an 82 y.o. female  Subjective:  Patient states that she had some chest pain last week and was in overnight for observation. MD gave a new rx for IMDUR, caregiver states it was to be called to [facility] and they do not have it.  Patient not having any chest pain today.  Denies any new issues this visit  Patient has not heard anything from pharmacy assistance.  Objective:  Patient neatly groomed and dressed. Met on porch outside. Patient wearing oxygen. BP 104/64 mmHg  Pulse 60  Resp 20  Wt 124 lb (56.246 kg)  SpO2 92%  Review of Systems  Constitutional: Negative.   HENT: Negative.   Eyes: Negative.   Respiratory: Positive for shortness of breath.        With exertion  Cardiovascular: Negative.  Negative for leg swelling.  Gastrointestinal: Negative.   Genitourinary: Positive for frequency.       Diuretic usage  Neurological: Negative.   Psychiatric/Behavioral: Negative.     Physical Exam  Constitutional: She is oriented to person, place, and time. She appears well-developed.  Cardiovascular: Normal rate.   Respiratory: Effort normal. She has rales.  Crackles in bases  GI: Soft.  Musculoskeletal: Normal range of motion.  Neurological: She is alert and oriented to person, place, and time.  Skin:  Bruise on middle top of arm  Psychiatric: She has a normal mood and affect. Her behavior is normal. Judgment and thought content normal.    Encounter Medications:   Outpatient Encounter Prescriptions as of 06/11/2016  Medication Sig Note  . acetaminophen (TYLENOL) 500 MG tablet Take 250-500 mg by mouth daily as needed for mild pain or moderate pain. Reported on 04/11/2016   . albuterol (PROVENTIL) (2.5 MG/3ML) 0.083% nebulizer solution Take 2.5 mg by nebulization 4 (four) times daily.    Marland Kitchen aspirin EC 81 MG tablet Take 81 mg by mouth daily.   .  budesonide-formoterol (SYMBICORT) 160-4.5 MCG/ACT inhaler Inhale 2 puffs into the lungs 2 (two) times daily.  05/04/2016: Samples given at MD office 05/03/16  . citalopram (CELEXA) 20 MG tablet Take 20 mg by mouth at bedtime.    . diazepam (VALIUM) 10 MG tablet Take 10 mg by mouth 3 (three) times daily as needed for anxiety.   . gabapentin (NEURONTIN) 100 MG capsule Take 100 mg by mouth 5 (five) times daily.    . isosorbide mononitrate (IMDUR) 30 MG 24 hr tablet Take 1 tablet (30 mg total) by mouth daily.   . meclizine (ANTIVERT) 25 MG tablet Take 1 tablet (25 mg total) by mouth 3 (three) times daily as needed for dizziness.   Marland Kitchen oxyCODONE-acetaminophen (PERCOCET) 10-325 MG tablet Take 1 tablet by mouth every 6 (six) hours as needed for pain.  05/23/2016: Md to change to medication without tylenol in June  . tiotropium (SPIRIVA) 18 MCG inhalation capsule Place 18 mcg into inhaler and inhale every evening.    . torsemide (DEMADEX) 20 MG tablet Take 1 tablet (20 mg total) by mouth 2 (two) times daily. 03/27/2016: Education on taking the second one earlier in day    No facility-administered encounter medications on file as of 06/11/2016.   Patient was recently discharged from hospital and all medications have been reviewed.  Assessment:   HF: Patient weighing daily, weight stable 122-124# Chest pain: EMMI education on angina given and reviewed with patient and caregiver  Plan:  [facility] CM Care Plan Problem One        Most Recent Value   Care Plan Problem One  knowledge deficit related to HF as evidenced by new diagnosis and questions around HF zones   Role Documenting the Problem One  Care Management Coordinator   Care Plan for Problem One  Active   [facility]N Long Term Goal (31-90 days)  Patient and or caregiver will be able to verbalize HF symptoms and understand when to contact MD or 911 over the next 90 days   [facility]N Long Term Goal Start Date  05/23/16   [facility] CM Short Term Goal #1 (0-30 days)  Patient will be  compliant with daily weights and record in [facility] calendar on weight log over the next 30 days   [facility] CM Short Term Goal #1 Start Date  05/23/16   [facility] CM Short Term Goal #1 Met Date  06/11/16   Interventions for Short Term Goal #1  Patient weighing daily and weights are stable    [facility]N CM Care Plan Problem Two        Most Recent Value   Care Plan Problem Two  Risk for infection related to insect/spider bite   Role Documenting the Problem Two  Care Management Coordinator   Care Plan for Problem Two  Not Active   [facility] CM Short Term Goal #1 (0-30 days)  Patient will report signs and symptoms of infection around insect/spider bite to MD   [facility] CM Short Term Goal #1 Start Date  05/23/16   [facility] CM Short Term Goal #1 Met Date   06/11/16    [facility] CM Care Plan Problem Three        Most Recent Value   Care Plan Problem Three  Medication affordability    Role Documenting the Problem Three  Care Management Coordinator   Care Plan for Problem Three  Active   [facility] CM Short Term Goal #1 (0-30 days)  over the next30 days patient caregiver will have required documents for medication assistance   [facility] CM Short Term Goal #1 Start Date  06/11/16   Interventions for Short Term Goal #1  Will follow up regarding assistance      Call to Covering MD office regarding rx for imdur. Caregiver to follow up on prescription  Plan to continue [facility] program services. Will visit again in July   Mary E. Laymond Purser, RN, BSN, [place] [phone]

## 2016-06-16 ENCOUNTER — Other Ambulatory Visit: Payer: Self-pay

## 2016-06-16 ENCOUNTER — Emergency Department (HOSPITAL_COMMUNITY)
Admission: EM | Admit: 2016-06-16 | Discharge: 2016-06-16 | Disposition: A | Discharge status: Home / Self Care | Payer: Commercial Managed Care - HMO | Attending: Emergency Medicine | Admitting: Emergency Medicine

## 2016-06-16 ENCOUNTER — Encounter (HOSPITAL_COMMUNITY): Payer: Self-pay | Admitting: Emergency Medicine

## 2016-06-16 DIAGNOSIS — F329 Major depressive disorder, single episode, unspecified: Secondary | ICD-10-CM | POA: Insufficient documentation

## 2016-06-16 DIAGNOSIS — M1990 Unspecified osteoarthritis, unspecified site: Secondary | ICD-10-CM | POA: Diagnosis not present

## 2016-06-16 DIAGNOSIS — T7840XA Allergy, unspecified, initial encounter: Secondary | ICD-10-CM | POA: Diagnosis not present

## 2016-06-16 DIAGNOSIS — Z79899 Other long term (current) drug therapy: Secondary | ICD-10-CM | POA: Insufficient documentation

## 2016-06-16 DIAGNOSIS — J449 Chronic obstructive pulmonary disease, unspecified: Secondary | ICD-10-CM | POA: Diagnosis not present

## 2016-06-16 DIAGNOSIS — Z7982 Long term (current) use of aspirin: Secondary | ICD-10-CM | POA: Insufficient documentation

## 2016-06-16 DIAGNOSIS — I509 Heart failure, unspecified: Secondary | ICD-10-CM | POA: Diagnosis not present

## 2016-06-16 DIAGNOSIS — R0602 Shortness of breath: Secondary | ICD-10-CM | POA: Diagnosis present

## 2016-06-16 DIAGNOSIS — J029 Acute pharyngitis, unspecified: Secondary | ICD-10-CM | POA: Diagnosis not present

## 2016-06-16 DIAGNOSIS — T50995A Adverse effect of other drugs, medicaments and biological substances, initial encounter: Secondary | ICD-10-CM | POA: Diagnosis not present

## 2016-06-16 DIAGNOSIS — I110 Hypertensive heart disease with heart failure: Secondary | ICD-10-CM | POA: Insufficient documentation

## 2016-06-16 DIAGNOSIS — Z87891 Personal history of nicotine dependence: Secondary | ICD-10-CM | POA: Diagnosis not present

## 2016-06-16 DIAGNOSIS — Z8673 Personal history of transient ischemic attack (TIA), and cerebral infarction without residual deficits: Secondary | ICD-10-CM | POA: Insufficient documentation

## 2016-06-16 DIAGNOSIS — Z79891 Long term (current) use of opiate analgesic: Secondary | ICD-10-CM | POA: Diagnosis not present

## 2016-06-16 LAB — RAPID STREP SCREEN (MED CTR MEBANE ONLY): Streptococcus, Group A Screen (Direct): NEGATIVE

## 2016-06-16 MED ORDER — DIPHENHYDRAMINE HCL 50 MG/ML IJ SOLN
12.5000 mg | Freq: Once | INTRAMUSCULAR | Status: AC
  Administered 2016-06-16: 12.5 mg via INTRAVENOUS
  Filled 2016-06-16: qty 1

## 2016-06-16 MED ORDER — FAMOTIDINE IN NACL 20-0.9 MG/50ML-% IV SOLN
10.0000 mg | Freq: Once | INTRAVENOUS | Status: AC
  Administered 2016-06-16: 10 mg via INTRAVENOUS
  Filled 2016-06-16: qty 50

## 2016-06-16 MED ORDER — METHYLPREDNISOLONE SODIUM SUCC 125 MG IJ SOLR
60.0000 mg | Freq: Once | INTRAMUSCULAR | Status: AC
  Administered 2016-06-16: 60 mg via INTRAVENOUS
  Filled 2016-06-16: qty 2

## 2016-06-16 NOTE — Discharge Instructions (Signed)
Strep test was negative. Gargle with salt water. Increase fluids. Follow-up your primary care doctor.

## 2016-06-16 NOTE — ED Notes (Signed)
Pt c/o sore throat and sob earlier today. Pt concerned she may be having an allergic reaction. Pt states it hurts to talk and swallow. Airway patent, pt controlling secretions. nad noted.

## 2016-06-16 NOTE — ED Provider Notes (Signed)
CSN: [id number]     Arrival date & time 06/16/16  1320 History   First MD Initiated Contact with Patient 06/16/16 1336     Chief Complaint  Patient presents with  . Shortness of Breath     (Consider location/radiation/quality/duration/timing/severity/associated sxs/prior Treatment) HPI...Marland KitchenMarland KitchenSensation of thickness in her throat approximately 1 hour ago. This was not associated with swallowing. No frank dyspnea, chest pain, fever, chills, cough, productive sputum. Severity symptoms is mild. Nothing makes symptoms better or worse.  Past Medical History  Diagnosis Date  . COPD (chronic obstructive pulmonary disease) (HCC)   . Hypertension   . Anxiety   . Depression   . Arthritis   . AAA (abdominal aortic aneurysm) (HCC)   . Neuropathy (HCC)   . Leg swelling   . Oxygen dependent   . Myocardial infarction (HCC)   . Pneumonia   . Stroke (HCC)   . CHF (congestive heart failure) (HCC) 11/09/2015   Past Surgical History  Procedure Laterality Date  . Cholecystectomy    . Appendectomy     Family History  Problem Relation Age of Onset  . Diabetes Brother   . Cancer Brother     Throat cancer, Heart attack  . Heart disease Brother   . Heart attack Brother   . Cancer Father   . Cancer Sister    Social History  Substance Use Topics  . Smoking status: Former Smoker -- 50 years    Types: Cigarettes    Quit date: 12/31/2010  . Smokeless tobacco: Former Neurosurgeon    Quit date: 12/31/2010  . Alcohol Use: No   OB History    Gravida Para Term Preterm AB TAB SAB Ectopic Multiple Living   5 5 5             Review of Systems  All other systems reviewed and are negative.     Allergies  Ciprofloxacin hcl; Nsaids; Other; and Tomato  Home Medications   Prior to Admission medications   Medication Sig Start Date End Date Taking? Authorizing Provider  acetaminophen (TYLENOL) 500 MG tablet Take 250-500 mg by mouth daily as needed for mild pain or moderate pain. Reported on 04/11/2016   Yes  Historical Provider, MD  albuterol (PROVENTIL) (2.5 MG/3ML) 0.083% nebulizer solution Take 2.5 mg by nebulization 4 (four) times daily.    Yes Historical Provider, MD  aspirin EC 81 MG tablet Take 81 mg by mouth daily.   Yes Historical Provider, MD  budesonide-formoterol (SYMBICORT) 160-4.5 MCG/ACT inhaler Inhale 2 puffs into the lungs 2 (two) times daily.    Yes Historical Provider, MD  citalopram (CELEXA) 20 MG tablet Take 20 mg by mouth at bedtime.    Yes Historical Provider, MD  diazepam (VALIUM) 10 MG tablet Take 10 mg by mouth 3 (three) times daily as needed for anxiety.   Yes Historical Provider, MD  gabapentin (NEURONTIN) 100 MG capsule Take 100 mg by mouth 5 (five) times daily.  09/06/15  Yes Historical Provider, MD  isosorbide mononitrate (IMDUR) 30 MG 24 hr tablet Take 1 tablet (30 mg total) by mouth daily. 06/07/16  Yes Kari Baars, MD  meclizine (ANTIVERT) 25 MG tablet Take 1 tablet (25 mg total) by mouth 3 (three) times daily as needed for dizziness. 05/14/16  Yes Ivery Quale, PA-C  oxyCODONE-acetaminophen (PERCOCET) 10-325 MG tablet Take 1 tablet by mouth every 6 (six) hours as needed for pain.  12/21/15  Yes Historical Provider, MD  tiotropium (SPIRIVA) 18 MCG inhalation capsule Place 18 mcg  into inhaler and inhale every evening.    Yes Historical Provider, MD  torsemide (DEMADEX) 20 MG tablet Take 1 tablet (20 mg total) by mouth 2 (two) times daily. 11/12/15  Yes Kari Baars, MD   BP 97/70 mmHg  Pulse 55  Temp(Src) 97.8 F (36.6 C) (Oral)  Resp 20  Ht  (1.549 m)  Wt 124 lb (56.246 kg)  BMI 23.44 kg/m2  SpO2 98% Physical Exam  Constitutional: She is oriented to person, place, and time. She appears well-developed and well-nourished.  HENT:  Head: Normocephalic and atraumatic.  Normal-appearing oral pharyngeal area  Eyes: Conjunctivae and EOM are normal. Pupils are equal, round, and reactive to light.  Neck: Normal range of motion. Neck supple.  No masses,  adenopathy, thyromegaly  Cardiovascular: Normal rate and regular rhythm.   Pulmonary/Chest: Effort normal and breath sounds normal.  Abdominal: Soft. Bowel sounds are normal.  Musculoskeletal: Normal range of motion.  Neurological: She is alert and oriented to person, place, and time.  Skin: Skin is warm and dry.  Psychiatric: She has a normal mood and affect. Her behavior is normal.  Nursing note and vitals reviewed.   ED Course  Procedures (including critical care time) Labs Review Labs Reviewed  RAPID STREP SCREEN (NOT AT [facility])  CULTURE, GROUP A STREP [facility])    Imaging Review No results found. I have personally reviewed and evaluated these images and lab results as part of my medical decision-making.   EKG Interpretation None      MDM   Final diagnoses:  Pharyngitis    Patient is hemodynamically stable. Airway intact. She is feels better after IV steroids, IV Benadryl, IV Pepcid.Strep test negative    Donnetta Hutching, MD 06/16/16 [phone]

## 2016-06-19 LAB — CULTURE, GROUP A STREP (THRC)

## 2016-07-11 DIAGNOSIS — R0902 Hypoxemia: Secondary | ICD-10-CM | POA: Diagnosis not present

## 2016-07-20 ENCOUNTER — Other Ambulatory Visit: Payer: Self-pay | Admitting: *Deleted

## 2016-07-20 ENCOUNTER — Encounter: Payer: Self-pay | Admitting: *Deleted

## 2016-07-20 NOTE — Patient Outreach (Signed)
[facility]) Care Management   07/20/2016  Melanie Lowe [birth year] [id number]  Melanie Lowe is an 82 y.o. female  Subjective:  Patient reports that she is doing about the same. She has some pain in her hands and wrist joints.  Caregiver states patient has upcoming appointment with Dr. Juanetta Gosling, does not know exact date. Caregiver has not heard anything more from patient assistance application.   Objective:  Patient neatly groomed and dressed, wearing portable oxygen, sitting on front porch. BP 100/64 mmHg  Pulse 64  Resp 22  Wt 121 lb (54.885 kg)  SpO2 94%  Review of Systems  Constitutional: Negative.   Eyes: Negative.   Respiratory: Negative.   Cardiovascular: Negative.   Gastrointestinal: Negative.   Musculoskeletal: Negative.   Skin: Negative.   Neurological: Negative.   Endo/Heme/Allergies: Negative.     Physical Exam  Constitutional: She is oriented to person, place, and time.  Cardiovascular: Normal rate.   Respiratory: Effort normal.  GI: Soft. Bowel sounds are normal.  Musculoskeletal: Normal range of motion.  Neurological: She is alert and oriented to person, place, and time.  Skin: Skin is warm and dry.  Psychiatric: She has a normal mood and affect.    Encounter Medications:   Outpatient Encounter Prescriptions as of 07/20/2016  Medication Sig Note  . acetaminophen (TYLENOL) 500 MG tablet Take 250-500 mg by mouth daily as needed for mild pain or moderate pain. Reported on 04/11/2016   . albuterol (PROVENTIL) (2.5 MG/3ML) 0.083% nebulizer solution Take 2.5 mg by nebulization 4 (four) times daily.    Marland Kitchen aspirin EC 81 MG tablet Take 81 mg by mouth daily.   . budesonide-formoterol (SYMBICORT) 160-4.5 MCG/ACT inhaler Inhale 2 puffs into the lungs 2 (two) times daily.  05/04/2016: Samples given at MD office 05/03/16  . citalopram (CELEXA) 20 MG tablet Take 20 mg by mouth at bedtime.    . diazepam (VALIUM) 10 MG tablet Take 10 mg by mouth 3 (three)  times daily as needed for anxiety.   . gabapentin (NEURONTIN) 100 MG capsule Take 100 mg by mouth 5 (five) times daily.    . isosorbide mononitrate (IMDUR) 30 MG 24 hr tablet Take 1 tablet (30 mg total) by mouth daily.   . meclizine (ANTIVERT) 25 MG tablet Take 1 tablet (25 mg total) by mouth 3 (three) times daily as needed for dizziness.   Marland Kitchen oxyCODONE-acetaminophen (PERCOCET) 10-325 MG tablet Take 1 tablet by mouth every 6 (six) hours as needed for pain.    Marland Kitchen tiotropium (SPIRIVA) 18 MCG inhalation capsule Place 18 mcg into inhaler and inhale every evening.    . torsemide (DEMADEX) 20 MG tablet Take 1 tablet (20 mg total) by mouth 2 (two) times daily. 03/27/2016: Education on taking the second one earlier in day    No facility-administered encounter medications on file as of 07/20/2016.      Assessment:   HF: gave and reviewed low sodium food tear off sheet COPD: wearing oxygen, no new symptoms other than "usual", reminded to stay in out of humidity and poor air quality.  Plan:  [facility] CM Care Plan Problem One        Most Recent Value   Care Plan Problem One  knowledge deficit related to HF as evidenced by new diagnosis and questions around HF zones   Role Documenting the Problem One  Care Management Coordinator   Care Plan for Problem One  Active   [facility] Long Term Goal (31-90 days)  Patient and or caregiver will be able to verbalize HF symptoms and understand when to contact MD or 911 over the next 90 days   [facility]N Long Term Goal Start Date  05/23/16   Interventions for Problem One Long Term Goal  Gave low sodium food tear off sheet, reviewed importance of following low sodium diet    [facility] CM Care Plan Problem Two        Most Recent Value   Care Plan Problem Two  Risk for infection related to insect/spider bite    [facility] CM Care Plan Problem Three        Most Recent Value   Care Plan Problem Three  Medication affordability    Role Documenting the Problem Three  Care Management Coordinator    Care Plan for Problem Three  Active   [facility] CM Short Term Goal #1 (0-30 days)  over the next30 days patient caregiver will have required documents for medication assistance   [facility] CM Short Term Goal #1 Start Date  07/20/16   Interventions for Short Term Goal #1  will send message to pharamacy regarding pharamcy assistance     Follow up on medication assistance Visit again in August Melanie Lowe E. [name], RN, BSN, CCM  [facility] [employer] [phone]

## 2016-08-11 DIAGNOSIS — R0902 Hypoxemia: Secondary | ICD-10-CM | POA: Diagnosis not present

## 2016-08-13 DIAGNOSIS — I10 Essential (primary) hypertension: Secondary | ICD-10-CM | POA: Diagnosis not present

## 2016-08-13 DIAGNOSIS — J449 Chronic obstructive pulmonary disease, unspecified: Secondary | ICD-10-CM | POA: Diagnosis not present

## 2016-08-13 DIAGNOSIS — I25119 Atherosclerotic heart disease of native coronary artery with unspecified angina pectoris: Secondary | ICD-10-CM | POA: Diagnosis not present

## 2016-08-13 DIAGNOSIS — I714 Abdominal aortic aneurysm, without rupture: Secondary | ICD-10-CM | POA: Diagnosis not present

## 2016-08-16 DIAGNOSIS — J449 Chronic obstructive pulmonary disease, unspecified: Secondary | ICD-10-CM | POA: Diagnosis not present

## 2016-08-16 DIAGNOSIS — M545 Low back pain: Secondary | ICD-10-CM | POA: Diagnosis not present

## 2016-08-16 DIAGNOSIS — M19031 Primary osteoarthritis, right wrist: Secondary | ICD-10-CM | POA: Diagnosis not present

## 2016-08-16 DIAGNOSIS — M19011 Primary osteoarthritis, right shoulder: Secondary | ICD-10-CM | POA: Diagnosis not present

## 2016-08-16 DIAGNOSIS — I2510 Atherosclerotic heart disease of native coronary artery without angina pectoris: Secondary | ICD-10-CM | POA: Diagnosis not present

## 2016-08-16 DIAGNOSIS — R2689 Other abnormalities of gait and mobility: Secondary | ICD-10-CM | POA: Diagnosis not present

## 2016-08-16 DIAGNOSIS — I10 Essential (primary) hypertension: Secondary | ICD-10-CM | POA: Diagnosis not present

## 2016-08-16 DIAGNOSIS — G8929 Other chronic pain: Secondary | ICD-10-CM | POA: Diagnosis not present

## 2016-08-16 DIAGNOSIS — M19032 Primary osteoarthritis, left wrist: Secondary | ICD-10-CM | POA: Diagnosis not present

## 2016-08-16 DIAGNOSIS — I739 Peripheral vascular disease, unspecified: Secondary | ICD-10-CM | POA: Diagnosis not present

## 2016-08-17 ENCOUNTER — Other Ambulatory Visit: Payer: Self-pay | Admitting: *Deleted

## 2016-08-17 ENCOUNTER — Encounter: Payer: Self-pay | Admitting: *Deleted

## 2016-08-17 NOTE — Patient Outreach (Signed)
[facility]) Care Management   08/17/2016  Melanie Lowe October 01, 1924 [id number]  Melanie Lowe is an 82 y.o. female  Subjective:  Patient reports she saw Dr. Juanetta GoslingHawkins on Monday, he gave 2 new medications, family has not picked up from pharmacy, states they plan to pickup today. Dr. Juanetta GoslingHawkins ordered Home care for patient, patient reports a therapist was there this week, she reported to him she has had some yellow sputum, he was reporting to MD. Patient continues to get Mobile Meals. She states she does not eat much, has good appetite, just not a lot of food available at times. RNCM offered food pantry resources, patient said no.   Objective:   BP 104/60 (BP Location: Right Arm, Patient Position: Sitting, Cuff Size: Normal)   Pulse 68   Resp 20   Wt 121 lb (54.9 kg)   SpO2 93%   BMI 22.86 kg/m  Review of Systems  Constitutional: Negative.   HENT: Negative.   Eyes: Negative.   Respiratory: Positive for sputum production.        Yellow  Cardiovascular: Negative.   Gastrointestinal: Negative.   Genitourinary: Negative.   Musculoskeletal: Positive for joint pain.       Hands and wrists  Skin: Negative.   Neurological: Negative.   Endo/Heme/Allergies: Negative.   Psychiatric/Behavioral: Negative.     Physical Exam  Constitutional: She is oriented to person, place, and time. She appears well-developed and well-nourished.  HENT:  Head: Normocephalic.  Neck: Normal range of motion.  Cardiovascular: Normal rate.   Respiratory: Effort normal.  GI: Soft. Bowel sounds are normal.  Musculoskeletal: Normal range of motion.  Neurological: She is alert and oriented to person, place, and time.  Skin: Skin is warm and dry.  Psychiatric: She has a normal mood and affect.    Encounter Medications:   BP 104/60 (BP Location: Right Arm, Patient Position: Sitting, Cuff Size: Normal)   Pulse 68   Resp 20   Wt 121 lb (54.9 kg)   SpO2 93%   BMI 22.86 kg/m  Outpatient  Encounter Prescriptions as of 08/17/2016  Medication Sig Note  . nitroGLYCERIN (NITROSTAT) 0.4 MG SL tablet Place 0.4 mg under the tongue every 5 (five) minutes as needed for chest pain.   Marland Kitchen. acetaminophen (TYLENOL) 500 MG tablet Take 250-500 mg by mouth daily as needed for mild pain or moderate pain. Reported on 04/11/2016   . albuterol (PROVENTIL) (2.5 MG/3ML) 0.083% nebulizer solution Take 2.5 mg by nebulization 4 (four) times daily.    Marland Kitchen. aspirin EC 81 MG tablet Take 81 mg by mouth daily.   . budesonide-formoterol (SYMBICORT) 160-4.5 MCG/ACT inhaler Inhale 2 puffs into the lungs 2 (two) times daily.  05/04/2016: Samples given at MD office 05/03/16  . citalopram (CELEXA) 20 MG tablet Take 20 mg by mouth at bedtime.    . diazepam (VALIUM) 10 MG tablet Take 10 mg by mouth 3 (three) times daily as needed for anxiety.   . gabapentin (NEURONTIN) 100 MG capsule Take 100 mg by mouth 5 (five) times daily.    . isosorbide mononitrate (IMDUR) 30 MG 24 hr tablet Take 1 tablet (30 mg total) by mouth daily.   . meclizine (ANTIVERT) 25 MG tablet Take 1 tablet (25 mg total) by mouth 3 (three) times daily as needed for dizziness.   Marland Kitchen. oxyCODONE-acetaminophen (PERCOCET) 10-325 MG tablet Take 1 tablet by mouth every 6 (six) hours as needed for pain.    Marland Kitchen. tiotropium (SPIRIVA) 18  MCG inhalation capsule Place 18 mcg into inhaler and inhale every evening.    . torsemide (DEMADEX) 20 MG tablet Take 1 tablet (20 mg total) by mouth 2 (two) times daily. 03/27/2016: Education on taking the second one earlier in day    No facility-administered encounter medications on file as of 08/17/2016.        Assessment:   HF: weight stable at 121#. Patient requires ongoing HF education, agrees to Health coach referral COPD: Patient has some yellow sputum, she reported to [facility], who was to report to MD. Educated patient that if sputum continued to be yellow or changed to green to call MD herself or have caregiver call for treatment.  Plan:   [facility] CM Care Plan Problem One   Flowsheet Row Most Recent Value  Care Plan Problem One  knowledge deficit related to HF as evidenced by new diagnosis and questions around HF zones  Role Documenting the Problem One  Care Management Coordinator  Care Plan for Problem One  Active  [facility] Long Term Goal (31-90 days)  Patient and or caregiver will be able to verbalize HF symptoms and understand when to contact MD or 911 over the next 90 days  Interventions for Problem One Long Term Goal  Discussed diet and continued to reinforce low sodium diet. Offered food pantry resources.     Will refer to Health Coach Will close [facility] community case per protocol  Corrie Dandy E. [name], RN, BSN, CCM  [facility] [employer] [phone]

## 2016-08-20 DIAGNOSIS — M545 Low back pain: Secondary | ICD-10-CM | POA: Diagnosis not present

## 2016-08-20 DIAGNOSIS — M19032 Primary osteoarthritis, left wrist: Secondary | ICD-10-CM | POA: Diagnosis not present

## 2016-08-20 DIAGNOSIS — G8929 Other chronic pain: Secondary | ICD-10-CM | POA: Diagnosis not present

## 2016-08-20 DIAGNOSIS — R2689 Other abnormalities of gait and mobility: Secondary | ICD-10-CM | POA: Diagnosis not present

## 2016-08-20 DIAGNOSIS — I2510 Atherosclerotic heart disease of native coronary artery without angina pectoris: Secondary | ICD-10-CM | POA: Diagnosis not present

## 2016-08-20 DIAGNOSIS — J449 Chronic obstructive pulmonary disease, unspecified: Secondary | ICD-10-CM | POA: Diagnosis not present

## 2016-08-20 DIAGNOSIS — I739 Peripheral vascular disease, unspecified: Secondary | ICD-10-CM | POA: Diagnosis not present

## 2016-08-20 DIAGNOSIS — M19031 Primary osteoarthritis, right wrist: Secondary | ICD-10-CM | POA: Diagnosis not present

## 2016-08-20 DIAGNOSIS — I10 Essential (primary) hypertension: Secondary | ICD-10-CM | POA: Diagnosis not present

## 2016-08-21 ENCOUNTER — Encounter: Payer: Self-pay | Admitting: *Deleted

## 2016-08-22 ENCOUNTER — Other Ambulatory Visit: Payer: Self-pay

## 2016-08-22 DIAGNOSIS — M545 Low back pain: Secondary | ICD-10-CM | POA: Diagnosis not present

## 2016-08-22 DIAGNOSIS — I10 Essential (primary) hypertension: Secondary | ICD-10-CM | POA: Diagnosis not present

## 2016-08-22 DIAGNOSIS — I739 Peripheral vascular disease, unspecified: Secondary | ICD-10-CM | POA: Diagnosis not present

## 2016-08-22 DIAGNOSIS — I2510 Atherosclerotic heart disease of native coronary artery without angina pectoris: Secondary | ICD-10-CM | POA: Diagnosis not present

## 2016-08-22 DIAGNOSIS — G8929 Other chronic pain: Secondary | ICD-10-CM | POA: Diagnosis not present

## 2016-08-22 DIAGNOSIS — M19032 Primary osteoarthritis, left wrist: Secondary | ICD-10-CM | POA: Diagnosis not present

## 2016-08-22 DIAGNOSIS — J449 Chronic obstructive pulmonary disease, unspecified: Secondary | ICD-10-CM | POA: Diagnosis not present

## 2016-08-22 DIAGNOSIS — R2689 Other abnormalities of gait and mobility: Secondary | ICD-10-CM | POA: Diagnosis not present

## 2016-08-22 DIAGNOSIS — M19031 Primary osteoarthritis, right wrist: Secondary | ICD-10-CM | POA: Diagnosis not present

## 2016-08-22 NOTE — Patient Outreach (Signed)
[facility]) Care Management  08/22/2016  Melanie Lowe 1924/09/11 [id number]   Telephone call to patient for introductory call. Spoke with patient who states she is sick on the stomach today and advised that I talk with her caregiver [name]. Caregiver [name] picked up the phone. She states that patient gets sick on the stomach from time to time from hernia. Advised to make sure patient is drinking plenty of fluids if not eating. She verbalized understanding and states she makes sure she has plenty to drink. Explained health coach role and that I would call next month. She verbalized understanding.    Plan: RN Health Coach will contact patient/caregiver in the month of September and patient/caregiver agrees to next outreach.  Bary Leriche, RN, MSN [facility] Care Management RN Telephonic Health Coach [phone]

## 2016-08-27 DIAGNOSIS — R2689 Other abnormalities of gait and mobility: Secondary | ICD-10-CM | POA: Diagnosis not present

## 2016-08-27 DIAGNOSIS — I10 Essential (primary) hypertension: Secondary | ICD-10-CM | POA: Diagnosis not present

## 2016-08-27 DIAGNOSIS — M19031 Primary osteoarthritis, right wrist: Secondary | ICD-10-CM | POA: Diagnosis not present

## 2016-08-27 DIAGNOSIS — M545 Low back pain: Secondary | ICD-10-CM | POA: Diagnosis not present

## 2016-08-27 DIAGNOSIS — M19032 Primary osteoarthritis, left wrist: Secondary | ICD-10-CM | POA: Diagnosis not present

## 2016-08-27 DIAGNOSIS — G8929 Other chronic pain: Secondary | ICD-10-CM | POA: Diagnosis not present

## 2016-08-27 DIAGNOSIS — I2510 Atherosclerotic heart disease of native coronary artery without angina pectoris: Secondary | ICD-10-CM | POA: Diagnosis not present

## 2016-08-27 DIAGNOSIS — I739 Peripheral vascular disease, unspecified: Secondary | ICD-10-CM | POA: Diagnosis not present

## 2016-08-27 DIAGNOSIS — J449 Chronic obstructive pulmonary disease, unspecified: Secondary | ICD-10-CM | POA: Diagnosis not present

## 2016-08-29 DIAGNOSIS — I10 Essential (primary) hypertension: Secondary | ICD-10-CM | POA: Diagnosis not present

## 2016-08-29 DIAGNOSIS — M545 Low back pain: Secondary | ICD-10-CM | POA: Diagnosis not present

## 2016-08-29 DIAGNOSIS — I739 Peripheral vascular disease, unspecified: Secondary | ICD-10-CM | POA: Diagnosis not present

## 2016-08-29 DIAGNOSIS — G8929 Other chronic pain: Secondary | ICD-10-CM | POA: Diagnosis not present

## 2016-08-29 DIAGNOSIS — R2689 Other abnormalities of gait and mobility: Secondary | ICD-10-CM | POA: Diagnosis not present

## 2016-08-29 DIAGNOSIS — M19032 Primary osteoarthritis, left wrist: Secondary | ICD-10-CM | POA: Diagnosis not present

## 2016-08-29 DIAGNOSIS — M19031 Primary osteoarthritis, right wrist: Secondary | ICD-10-CM | POA: Diagnosis not present

## 2016-08-29 DIAGNOSIS — J449 Chronic obstructive pulmonary disease, unspecified: Secondary | ICD-10-CM | POA: Diagnosis not present

## 2016-08-29 DIAGNOSIS — I2510 Atherosclerotic heart disease of native coronary artery without angina pectoris: Secondary | ICD-10-CM | POA: Diagnosis not present

## 2016-09-02 ENCOUNTER — Emergency Department (HOSPITAL_COMMUNITY): Payer: Commercial Managed Care - HMO

## 2016-09-02 ENCOUNTER — Observation Stay (HOSPITAL_COMMUNITY)
Admission: EM | Admit: 2016-09-02 | Discharge: 2016-09-04 | Disposition: A | Discharge status: Home / Self Care | Payer: Commercial Managed Care - HMO | Attending: Internal Medicine | Admitting: Internal Medicine

## 2016-09-02 ENCOUNTER — Encounter (HOSPITAL_COMMUNITY): Payer: Self-pay | Admitting: Emergency Medicine

## 2016-09-02 DIAGNOSIS — Z87891 Personal history of nicotine dependence: Secondary | ICD-10-CM | POA: Insufficient documentation

## 2016-09-02 DIAGNOSIS — N183 Chronic kidney disease, stage 3 unspecified: Secondary | ICD-10-CM | POA: Diagnosis present

## 2016-09-02 DIAGNOSIS — E440 Moderate protein-calorie malnutrition: Secondary | ICD-10-CM | POA: Diagnosis not present

## 2016-09-02 DIAGNOSIS — Z66 Do not resuscitate: Secondary | ICD-10-CM | POA: Insufficient documentation

## 2016-09-02 DIAGNOSIS — I719 Aortic aneurysm of unspecified site, without rupture: Secondary | ICD-10-CM

## 2016-09-02 DIAGNOSIS — F419 Anxiety disorder, unspecified: Secondary | ICD-10-CM | POA: Diagnosis not present

## 2016-09-02 DIAGNOSIS — Z79899 Other long term (current) drug therapy: Secondary | ICD-10-CM | POA: Diagnosis not present

## 2016-09-02 DIAGNOSIS — J449 Chronic obstructive pulmonary disease, unspecified: Secondary | ICD-10-CM | POA: Insufficient documentation

## 2016-09-02 DIAGNOSIS — N179 Acute kidney failure, unspecified: Secondary | ICD-10-CM | POA: Diagnosis not present

## 2016-09-02 DIAGNOSIS — I5032 Chronic diastolic (congestive) heart failure: Secondary | ICD-10-CM | POA: Insufficient documentation

## 2016-09-02 DIAGNOSIS — I130 Hypertensive heart and chronic kidney disease with heart failure and stage 1 through stage 4 chronic kidney disease, or unspecified chronic kidney disease: Secondary | ICD-10-CM | POA: Insufficient documentation

## 2016-09-02 DIAGNOSIS — R071 Chest pain on breathing: Secondary | ICD-10-CM | POA: Diagnosis not present

## 2016-09-02 DIAGNOSIS — F329 Major depressive disorder, single episode, unspecified: Secondary | ICD-10-CM | POA: Insufficient documentation

## 2016-09-02 DIAGNOSIS — N184 Chronic kidney disease, stage 4 (severe): Secondary | ICD-10-CM | POA: Diagnosis not present

## 2016-09-02 DIAGNOSIS — Z8673 Personal history of transient ischemic attack (TIA), and cerebral infarction without residual deficits: Secondary | ICD-10-CM | POA: Insufficient documentation

## 2016-09-02 DIAGNOSIS — G629 Polyneuropathy, unspecified: Secondary | ICD-10-CM | POA: Diagnosis not present

## 2016-09-02 DIAGNOSIS — Z7951 Long term (current) use of inhaled steroids: Secondary | ICD-10-CM | POA: Diagnosis not present

## 2016-09-02 DIAGNOSIS — Z9981 Dependence on supplemental oxygen: Secondary | ICD-10-CM | POA: Insufficient documentation

## 2016-09-02 DIAGNOSIS — I714 Abdominal aortic aneurysm, without rupture: Secondary | ICD-10-CM | POA: Diagnosis not present

## 2016-09-02 DIAGNOSIS — R079 Chest pain, unspecified: Secondary | ICD-10-CM | POA: Diagnosis present

## 2016-09-02 DIAGNOSIS — I252 Old myocardial infarction: Secondary | ICD-10-CM | POA: Insufficient documentation

## 2016-09-02 DIAGNOSIS — Z6823 Body mass index (BMI) 23.0-23.9, adult: Secondary | ICD-10-CM | POA: Diagnosis not present

## 2016-09-02 DIAGNOSIS — R0789 Other chest pain: Secondary | ICD-10-CM | POA: Diagnosis not present

## 2016-09-02 DIAGNOSIS — Z7982 Long term (current) use of aspirin: Secondary | ICD-10-CM | POA: Diagnosis not present

## 2016-09-02 DIAGNOSIS — R0602 Shortness of breath: Secondary | ICD-10-CM | POA: Diagnosis not present

## 2016-09-02 DIAGNOSIS — I2510 Atherosclerotic heart disease of native coronary artery without angina pectoris: Secondary | ICD-10-CM | POA: Diagnosis not present

## 2016-09-02 DIAGNOSIS — I5189 Other ill-defined heart diseases: Secondary | ICD-10-CM | POA: Diagnosis present

## 2016-09-02 LAB — COMPREHENSIVE METABOLIC PANEL
ALBUMIN: 3.1 g/dL — AB (ref 3.5–5.0)
ALT: 10 U/L — ABNORMAL LOW (ref 14–54)
AST: 17 U/L (ref 15–41)
Alkaline Phosphatase: 64 U/L (ref 38–126)
Anion gap: 5 (ref 5–15)
BUN: 28 mg/dL — AB (ref 6–20)
CHLORIDE: 105 mmol/L (ref 101–111)
CO2: 28 mmol/L (ref 22–32)
Calcium: 8.8 mg/dL — ABNORMAL LOW (ref 8.9–10.3)
Creatinine, Ser: 1.48 mg/dL — ABNORMAL HIGH (ref 0.44–1.00)
GFR calc Af Amer: 35 mL/min — ABNORMAL LOW (ref >60)
GFR calc non Af Amer: 30 mL/min — ABNORMAL LOW (ref >60)
GLUCOSE: 120 mg/dL — AB (ref 65–99)
POTASSIUM: 3.7 mmol/L (ref 3.5–5.1)
SODIUM: 138 mmol/L (ref 135–145)
Total Bilirubin: <0.1 mg/dL — ABNORMAL LOW (ref 0.3–1.2)
Total Protein: 5.9 g/dL — ABNORMAL LOW (ref 6.5–8.1)

## 2016-09-02 LAB — I-STAT CHEM 8, ED
BUN: 35 mg/dL — ABNORMAL HIGH (ref 6–20)
CHLORIDE: 99 mmol/L — AB (ref 101–111)
CREATININE: 1.5 mg/dL — AB (ref 0.44–1.00)
Calcium, Ion: 1.19 mmol/L (ref 1.15–1.40)
GLUCOSE: 112 mg/dL — AB (ref 65–99)
HCT: 35 % — ABNORMAL LOW (ref 36.0–46.0)
Hemoglobin: 11.9 g/dL — ABNORMAL LOW (ref 12.0–15.0)
POTASSIUM: 3.7 mmol/L (ref 3.5–5.1)
Sodium: 141 mmol/L (ref 135–145)
TCO2: 31 mmol/L (ref 0–100)

## 2016-09-02 LAB — CBC
HEMATOCRIT: 32.4 % — AB (ref 36.0–46.0)
Hemoglobin: 10.2 g/dL — ABNORMAL LOW (ref 12.0–15.0)
MCH: 29.2 pg (ref 26.0–34.0)
MCHC: 31.5 g/dL (ref 30.0–36.0)
MCV: 92.8 fL (ref 78.0–100.0)
Platelets: 206 10*3/uL (ref 150–400)
RBC: 3.49 MIL/uL — ABNORMAL LOW (ref 3.87–5.11)
RDW: 14.5 % (ref 11.5–15.5)
WBC: 4.7 10*3/uL (ref 4.0–10.5)

## 2016-09-02 LAB — I-STAT TROPONIN, ED: Troponin i, poc: 0.01 ng/mL (ref 0.00–0.08)

## 2016-09-02 LAB — BRAIN NATRIURETIC PEPTIDE: B Natriuretic Peptide: 63.6 pg/mL (ref 0.0–100.0)

## 2016-09-02 MED ORDER — ISOSORBIDE MONONITRATE ER 30 MG PO TB24
30.0000 mg | ORAL_TABLET | Freq: Every day | ORAL | Status: DC
  Administered 2016-09-03 – 2016-09-04 (×2): 30 mg via ORAL
  Filled 2016-09-02 (×2): qty 1

## 2016-09-02 MED ORDER — MOMETASONE FURO-FORMOTEROL FUM 200-5 MCG/ACT IN AERO
2.0000 | INHALATION_SPRAY | Freq: Two times a day (BID) | RESPIRATORY_TRACT | Status: DC
  Administered 2016-09-03 – 2016-09-04 (×3): 2 via RESPIRATORY_TRACT
  Filled 2016-09-02: qty 8.8

## 2016-09-02 MED ORDER — NITROGLYCERIN 0.4 MG SL SUBL: 0.4000 mg | SUBLINGUAL_TABLET | SUBLINGUAL | Status: DC | PRN

## 2016-09-02 MED ORDER — TORSEMIDE 20 MG PO TABS
20.0000 mg | ORAL_TABLET | Freq: Two times a day (BID) | ORAL | Status: DC
  Administered 2016-09-03: 20 mg via ORAL
  Filled 2016-09-02: qty 1

## 2016-09-02 MED ORDER — ALBUTEROL SULFATE (2.5 MG/3ML) 0.083% IN NEBU
2.5000 mg | INHALATION_SOLUTION | Freq: Four times a day (QID) | RESPIRATORY_TRACT | Status: DC
  Administered 2016-09-03 – 2016-09-04 (×6): 2.5 mg via RESPIRATORY_TRACT
  Filled 2016-09-02 (×6): qty 3

## 2016-09-02 MED ORDER — OXYCODONE-ACETAMINOPHEN 10-325 MG PO TABS: 1.0000 | ORAL_TABLET | Freq: Four times a day (QID) | ORAL | Status: DC | PRN

## 2016-09-02 MED ORDER — MECLIZINE HCL 25 MG PO TABS: 25.0000 mg | ORAL_TABLET | Freq: Three times a day (TID) | ORAL | Status: DC | PRN

## 2016-09-02 MED ORDER — CITALOPRAM HYDROBROMIDE 20 MG PO TABS
20.0000 mg | ORAL_TABLET | Freq: Every day | ORAL | Status: DC
  Administered 2016-09-03 (×2): 20 mg via ORAL
  Filled 2016-09-02 (×2): qty 1

## 2016-09-02 MED ORDER — ASPIRIN EC 325 MG PO TBEC
325.0000 mg | DELAYED_RELEASE_TABLET | Freq: Every day | ORAL | Status: DC
  Administered 2016-09-03 – 2016-09-04 (×2): 325 mg via ORAL
  Filled 2016-09-02 (×2): qty 1

## 2016-09-02 MED ORDER — DIAZEPAM 5 MG PO TABS: 10.0000 mg | ORAL_TABLET | Freq: Three times a day (TID) | ORAL | Status: DC | PRN

## 2016-09-02 MED ORDER — GABAPENTIN 100 MG PO CAPS
100.0000 mg | ORAL_CAPSULE | Freq: Every day | ORAL | Status: DC
  Administered 2016-09-03 – 2016-09-04 (×8): 100 mg via ORAL
  Filled 2016-09-02 (×8): qty 1

## 2016-09-02 MED ORDER — ACETAMINOPHEN 325 MG PO TABS: 650.0000 mg | ORAL_TABLET | ORAL | Status: DC | PRN

## 2016-09-02 MED ORDER — ONDANSETRON HCL 4 MG/2ML IJ SOLN: 4.0000 mg | Freq: Four times a day (QID) | INTRAMUSCULAR | Status: DC | PRN

## 2016-09-02 MED ORDER — TIOTROPIUM BROMIDE MONOHYDRATE 18 MCG IN CAPS
18.0000 ug | ORAL_CAPSULE | Freq: Every evening | RESPIRATORY_TRACT | Status: DC
  Administered 2016-09-03: 18 ug via RESPIRATORY_TRACT
  Filled 2016-09-02: qty 5

## 2016-09-02 NOTE — ED Notes (Signed)
Pt transported to radiology.

## 2016-09-02 NOTE — ED Notes (Signed)
Attempted report x1. 

## 2016-09-02 NOTE — ED Notes (Signed)
Pt back from x-ray.

## 2016-09-02 NOTE — H&P (Signed)
History and Physical    Melanie Musicda S Alcock LKG:[id number] DOB: Jan 22, 1924 DOA: 09/02/2016  PCP: Fredirick MaudlinHAWKINS,EDWARD L, MD  Patient coming from: Home.  Chief Complaint: Chest pain.  HPI: Melanie Lowe is a 82 y.o. female with abdominal aortic aneurysm, diastolic CHF, COPD presents to the ER because of chest pain. Patient's chest pain happened last evening while at home playing with cats. Pain was retrosternal radiating to the back. EMS was called and patient was brought to the ER and by the time patient reached ER chest pain resolved. Presently chest pain-free. EKG shows sinus bradycardia troponins were negative. Patient is being admitted for further observation and management.   ED Course: Chest x-ray was unremarkable. Troponin was negative. EKG shows sinus bradycardia.  Review of Systems: As per HPI, rest all negative.   Past Medical History:  Diagnosis Date  . AAA (abdominal aortic aneurysm) (HCC)   . Anxiety   . Arthritis   . CHF (congestive heart failure) (HCC) 11/09/2015  . COPD (chronic obstructive pulmonary disease) (HCC)   . Depression   . Hypertension   . Leg swelling   . Myocardial infarction (HCC)   . Neuropathy (HCC)   . Oxygen dependent   . Pneumonia   . Stroke [facility](HCC)     Past Surgical History:  Procedure Laterality Date  . APPENDECTOMY    . CHOLECYSTECTOMY       reports that she quit smoking about 5 years ago. Her smoking use included Cigarettes. She quit after 50.00 years of use. She quit smokeless tobacco use about 5 years ago. She reports that she does not drink alcohol or use drugs.  Allergies  Allergen Reactions  . Ciprofloxacin Hcl Nausea And Vomiting  . Nsaids     Per patients son due to AAA.  Can only take coated asprin  . Other     FOOD: Spice Triggers acid reflux  . Tomato     Triggers acid reflux    Family History  Problem Relation Age of Onset  . Diabetes Brother   . Cancer Brother     Throat cancer, Heart attack  . Heart disease Brother   . Heart  attack Brother   . Cancer Father   . Cancer Sister     Prior to Admission medications   Medication Sig Start Date End Date Taking? Authorizing Provider  acetaminophen (TYLENOL) 500 MG tablet Take 250-500 mg by mouth daily as needed for mild pain or moderate pain. Reported on 04/11/2016   Yes Historical Provider, MD  albuterol (PROVENTIL) (2.5 MG/3ML) 0.083% nebulizer solution Take 2.5 mg by nebulization 4 (four) times daily.    Yes Historical Provider, MD  aspirin EC 81 MG tablet Take 81 mg by mouth daily.   Yes Historical Provider, MD  budesonide-formoterol (SYMBICORT) 160-4.5 MCG/ACT inhaler Inhale 2 puffs into the lungs 2 (two) times daily.    Yes Historical Provider, MD  citalopram (CELEXA) 20 MG tablet Take 20 mg by mouth at bedtime.    Yes Historical Provider, MD  diazepam (VALIUM) 10 MG tablet Take 10 mg by mouth 3 (three) times daily as needed for anxiety.   Yes Historical Provider, MD  gabapentin (NEURONTIN) 100 MG capsule Take 100 mg by mouth 5 (five) times daily.  09/06/15  Yes Historical Provider, MD  isosorbide mononitrate (IMDUR) 30 MG 24 hr tablet Take 1 tablet (30 mg total) by mouth daily. 06/07/16  Yes Kari BaarsEdward Hawkins, MD  meclizine (ANTIVERT) 25 MG tablet Take 1 tablet (25 mg  total) by mouth 3 (three) times daily as needed for dizziness. 05/14/16  Yes Ivery Quale, PA-C  nitroGLYCERIN (NITROSTAT) 0.4 MG SL tablet Place 0.4 mg under the tongue every 5 (five) minutes as needed for chest pain. 08/17/16  Yes Historical Provider, MD  oxyCODONE-acetaminophen (PERCOCET) 10-325 MG tablet Take 1 tablet by mouth every 6 (six) hours as needed for pain.  12/21/15  Yes Historical Provider, MD  tiotropium (SPIRIVA) 18 MCG inhalation capsule Place 18 mcg into inhaler and inhale every evening.    Yes Historical Provider, MD  torsemide (DEMADEX) 20 MG tablet Take 1 tablet (20 mg total) by mouth 2 (two) times daily. 11/12/15  Yes Kari Baars, MD    Physical Exam: Vitals:   09/02/16 2030 09/02/16  2145 09/02/16 2215 09/02/16 2242  BP: 127/74 128/74 129/75 (!) 148/96  Pulse: (!) 49 (!) 49 (!) 45 (!) 50  Resp: 12 14 12 18   Temp:    97.7 F (36.5 C)  TempSrc:    Oral  SpO2: 100% 100% 100% 100%  Weight:    125 lb 3.2 oz (56.8 kg)  Height:    5\' 1"  (1.549 m)      Constitutional: Not in distress. Vitals:   09/02/16 2030 09/02/16 2145 09/02/16 2215 09/02/16 2242  BP: 127/74 128/74 129/75 (!) 148/96  Pulse: (!) 49 (!) 49 (!) 45 (!) 50  Resp: 12 14 12 18   Temp:    97.7 F (36.5 C)  TempSrc:    Oral  SpO2: 100% 100% 100% 100%  Weight:    125 lb 3.2 oz (56.8 kg)  Height:    5\' 1"  (1.549 m)   Eyes: Anicteric no pallor. ENMT: No discharge from the ears eyes nose or mouth. Neck: No mass felt. No JVD appreciated. Respiratory: No rhonchi or crepitations. Cardiovascular: S1 and S2 heard. Abdomen: Soft nontender bowel sounds present. No guarding or rigidity. Musculoskeletal: No edema. Skin: No rash. Neurologic: Alert awake oriented to time place and person. Moves all extremities. Psychiatric: Appears normal.   Labs on Admission: I have personally reviewed following labs and imaging studies  CBC:  Recent Labs Lab 09/02/16 1903 09/02/16 1959  WBC 4.7  --   HGB 10.2* 11.9*  HCT 32.4* 35.0*  MCV 92.8  --   PLT 206  --    Basic Metabolic Panel:  Recent Labs Lab 09/02/16 1903 09/02/16 1959  NA 138 141  K 3.7 3.7  CL 105 99*  CO2 28  --   GLUCOSE 120* 112*  BUN 28* 35*  CREATININE 1.48* 1.50*  CALCIUM 8.8*  --    GFR: Estimated Creatinine Clearance: 18.8 mL/min (by C-G formula based on SCr of 1.5 mg/dL). Liver Function Tests:  Recent Labs Lab 09/02/16 1903  AST 17  ALT 10*  ALKPHOS 64  BILITOT <0.1*  PROT 5.9*  ALBUMIN 3.1*   No results for input(s): LIPASE, AMYLASE in the last 168 hours. No results for input(s): AMMONIA in the last 168 hours. Coagulation Profile: No results for input(s): INR, PROTIME in the last 168 hours. Cardiac Enzymes: No  results for input(s): CKTOTAL, CKMB, CKMBINDEX, TROPONINI in the last 168 hours. BNP (last 3 results) No results for input(s): PROBNP in the last 8760 hours. HbA1C: No results for input(s): HGBA1C in the last 72 hours. CBG: No results for input(s): GLUCAP in the last 168 hours. Lipid Profile: No results for input(s): CHOL, HDL, LDLCALC, TRIG, CHOLHDL, LDLDIRECT in the last 72 hours. Thyroid Function Tests: No results  for input(s): TSH, T4TOTAL, FREET4, T3FREE, THYROIDAB in the last 72 hours. Anemia Panel: No results for input(s): VITAMINB12, FOLATE, FERRITIN, TIBC, IRON, RETICCTPCT in the last 72 hours. Urine analysis:    Component Value Date/Time   COLORURINE YELLOW 05/14/2016 1100   APPEARANCEUR CLEAR 05/14/2016 1100   LABSPEC 1.010 05/14/2016 1100   PHURINE 6.0 05/14/2016 1100   GLUCOSEU NEGATIVE 05/14/2016 1100   HGBUR NEGATIVE 05/14/2016 1100   BILIRUBINUR NEGATIVE 05/14/2016 1100   KETONESUR NEGATIVE 05/14/2016 1100   PROTEINUR NEGATIVE 05/14/2016 1100   UROBILINOGEN 0.2 11/02/2015 2014   NITRITE NEGATIVE 05/14/2016 1100   LEUKOCYTESUR NEGATIVE 05/14/2016 1100   Sepsis Labs: @LABRCNTIP (procalcitonin:4,lacticidven:4) )No results found for this or any previous visit (from the past 240 hour(s)).   Radiological Exams on Admission: Dg Chest 2 View  Result Date: 09/02/2016 CLINICAL DATA:  Chest pain and shortness of breath beginning at 4 p.m. today. EXAM: CHEST  2 VIEW COMPARISON:  PA and lateral chest 06/06/2016 and 09/05/2015. CT chest 07/01/2015. FINDINGS: Linear atelectasis or scar is seen in the right lung base and lingula. The lungs are emphysematous but clear. Cardiomegaly is identified. No pneumothorax or pleural effusion. Aortic atherosclerosis is noted. No focal bony abnormality. IMPRESSION: No acute disease. Emphysema. Cardiomegaly. Atherosclerosis. Electronically Signed   By: Drusilla Kanner M.D.   On: 09/02/2016 20:07    EKG: Independently reviewed. Sinus  bradycardia.  Assessment/Plan Principal Problem:   Chest pain Active Problems:   COPD (chronic obstructive pulmonary disease) (HCC)   CKD (chronic kidney disease) stage 3, GFR 30-59 ml/min   Diastolic dysfunction    1. Chest pain - we will cycle cardiac markers. Check 2-D echo. Aspirin. Will also check sonogram of the abdomen to see if there is any worsening of patient's known abdominal aortic aneurysm. Patient is presently chest pain-free. 2. Diastolic CHF last EF measured and June 2017 was 65-70% - continue Imdur and torsemide. Appears compensated. 3. COPD - presently not wheezing. Continue inhalers. 4. Abdominal aortic aneurysm - denies any abdominal pain. Check sonogram.   DVT prophylaxis: SCDs. Code Status: DO NOT RESUSCITATE.  Family Communication: Discussed patient.  Disposition Plan: Home.  Consults called: None.  Admission status: Observation.    Eduard Clos MD [facility] Pager [phone].  If 7PM-7AM, please contact night-coverage [email or web address] Password [email or web address]  09/02/2016, 11:59 PM

## 2016-09-02 NOTE — ED Provider Notes (Signed)
[facility]-EMERGENCY DEPT Provider Note   CSN: [id number] Arrival date & time: 09/02/16  1851     History   Chief Complaint Chief Complaint  Patient presents with  . Chest Pain  . Shortness of Breath    HPI Melanie Lowe is a 82 y.o. female.  82 year old female with past medical history of hypertension, hyperlipidemia, coronary artery disease and CHF who presents with substernal chest pressure. The patient states that just prior to arrival, she was resting at home when she experienced acute onset of chest pain. She describes the pain as a pressure-like sensation in her left lateral chest. She had associated shortness of breath as well as mild nausea. The pain resolves spontaneously. She has a history of coronary disease with similar pain. Denies any recent fevers or chills. Denies any lower extremity edema. She has been taking her medications as prescribed. Denies any fevers, chills, cough or sputum production   The history is provided by the patient and medical records.    Past Medical History:  Diagnosis Date  . AAA (abdominal aortic aneurysm) (HCC)   . Anxiety   . Arthritis   . CHF (congestive heart failure) (HCC) 11/09/2015  . COPD (chronic obstructive pulmonary disease) (HCC)   . Depression   . Hypertension   . Leg swelling   . Myocardial infarction (HCC)   . Neuropathy (HCC)   . Oxygen dependent   . Pneumonia   . Stroke [facility])     Patient Active Problem List   Diagnosis Date Noted  . CKD (chronic kidney disease) stage 3, GFR 30-59 ml/min 09/02/2016  . Diastolic dysfunction 09/02/2016  . Malnutrition of moderate degree 04/13/2016  . COPD exacerbation (HCC) 04/11/2016  . Anxiety 04/11/2016  . PNA (pneumonia) 04/11/2016  . Acute on chronic diastolic heart failure (HCC) 11/12/2015  . CHF (congestive heart failure) (HCC) 11/09/2015  . Acute on chronic respiratory failure (HCC) 11/05/2015  . Palliative care encounter   . DNR (do not resuscitate)   . Diverticulitis  11/02/2015  . Acute diverticulitis 11/02/2015  . AKI (acute kidney injury) (HCC) 11/02/2015  . Anemia 11/02/2015  . RLL pneumonia 11/02/2015  . Pneumonia 04/12/2015  . Failure to thrive in adult 11/11/2014  . Acute encephalopathy 11/11/2014  . Generalized weakness 11/11/2014  . Hypokalemia 11/11/2014  . Bradycardia 11/11/2014  . Dehydration 01/26/2014  . Orthostatic dizziness 01/24/2014  . SOB (shortness of breath) 01/24/2014  . Chronic respiratory failure with hypoxia (HCC) 01/24/2014  . Cough with hemoptysis 01/24/2014  . COPD (chronic obstructive pulmonary disease) (HCC) 01/24/2014  . Acute renal failure (HCC) 01/24/2014  . Dizzy 01/24/2014  . Acute-on-chronic respiratory failure (HCC) 10/12/2012  . Chest pain 10/10/2012  . CAP (community acquired pneumonia) 10/10/2012  . Aneurysm of thoracic aorta (HCC) 04/16/2012  . Chest pain 08/01/2011  . HYPERCALCEMIA 06/16/2008  . ALLERGIC RHINITIS 12/05/2007  . WEIGHT GAIN 11/07/2007  . PAIN IN JOINT, LOWER LEG 10/09/2007  . TOBACCO ABUSE 09/29/2007  . Abdominal aortic aneurysm (HCC) 09/29/2007  . Sciatica 09/24/2007  . BACK PAIN 09/24/2007  . Anxiety disorder 11/28/2006  . DEPRESSION 11/28/2006  . CATARACT NOS 11/28/2006  . Essential hypertension 11/28/2006  . MYOCARDIAL INFARCTION, HX OF 11/28/2006  . COPD 11/28/2006  . OVERACTIVE BLADDER 11/28/2006  . UTI'S, RECURRENT 11/28/2006  . LOW BACK PAIN 11/28/2006  . URINARY INCONTINENCE 11/28/2006    Past Surgical History:  Procedure Laterality Date  . APPENDECTOMY    . CHOLECYSTECTOMY      OB History  Gravida Para Term Preterm AB Living   5 5 5          SAB TAB Ectopic Multiple Live Births                   Home Medications    Prior to Admission medications   Medication Sig Start Date End Date Taking? Authorizing Provider  acetaminophen (TYLENOL) 500 MG tablet Take 250-500 mg by mouth daily as needed for mild pain or moderate pain. Reported on 04/11/2016   Yes  Historical Provider, MD  albuterol (PROVENTIL) (2.5 MG/3ML) 0.083% nebulizer solution Take 2.5 mg by nebulization 4 (four) times daily.    Yes Historical Provider, MD  aspirin EC 81 MG tablet Take 81 mg by mouth daily.   Yes Historical Provider, MD  budesonide-formoterol (SYMBICORT) 160-4.5 MCG/ACT inhaler Inhale 2 puffs into the lungs 2 (two) times daily.    Yes Historical Provider, MD  citalopram (CELEXA) 20 MG tablet Take 20 mg by mouth at bedtime.    Yes Historical Provider, MD  diazepam (VALIUM) 10 MG tablet Take 10 mg by mouth 3 (three) times daily as needed for anxiety.   Yes Historical Provider, MD  gabapentin (NEURONTIN) 100 MG capsule Take 100 mg by mouth 5 (five) times daily.  09/06/15  Yes Historical Provider, MD  isosorbide mononitrate (IMDUR) 30 MG 24 hr tablet Take 1 tablet (30 mg total) by mouth daily. 06/07/16  Yes Kari BaarsEdward Hawkins, MD  meclizine (ANTIVERT) 25 MG tablet Take 1 tablet (25 mg total) by mouth 3 (three) times daily as needed for dizziness. 05/14/16  Yes Ivery QualeHobson Bryant, PA-C  nitroGLYCERIN (NITROSTAT) 0.4 MG SL tablet Place 0.4 mg under the tongue every 5 (five) minutes as needed for chest pain. 08/17/16  Yes Historical Provider, MD  oxyCODONE-acetaminophen (PERCOCET) 10-325 MG tablet Take 1 tablet by mouth every 6 (six) hours as needed for pain.  12/21/15  Yes Historical Provider, MD  tiotropium (SPIRIVA) 18 MCG inhalation capsule Place 18 mcg into inhaler and inhale every evening.    Yes Historical Provider, MD  torsemide (DEMADEX) 20 MG tablet Take 1 tablet (20 mg total) by mouth 2 (two) times daily. 11/12/15  Yes Kari BaarsEdward Hawkins, MD    Family History Family History  Problem Relation Age of Onset  . Diabetes Brother   . Cancer Brother     Throat cancer, Heart attack  . Heart disease Brother   . Heart attack Brother   . Cancer Father   . Cancer Sister     Social History Social History  Substance Use Topics  . Smoking status: Former Smoker    Years: 50.00    Types:  Cigarettes    Quit date: 12/31/2010  . Smokeless tobacco: Former NeurosurgeonUser    Quit date: 12/31/2010  . Alcohol use No     Allergies   Ciprofloxacin hcl; Nsaids; Other; and Tomato   Review of Systems Review of Systems  Constitutional: Positive for fatigue. Negative for chills and fever.  HENT: Negative for congestion, rhinorrhea and sore throat.   Eyes: Negative for visual disturbance.  Respiratory: Positive for chest tightness and shortness of breath. Negative for cough and wheezing.   Cardiovascular: Positive for chest pain. Negative for leg swelling.  Gastrointestinal: Negative for abdominal pain, diarrhea, nausea and vomiting.  Genitourinary: Negative for dysuria, flank pain, vaginal bleeding and vaginal discharge.  Musculoskeletal: Negative for neck pain.  Skin: Negative for rash.  Allergic/Immunologic: Negative for immunocompromised state.  Neurological: Negative for syncope and headaches.  Hematological: Does not bruise/bleed easily.  All other systems reviewed and are negative.    Physical Exam Updated Vital Signs BP (!) 145/94 (BP Location: Right Arm)   Pulse (!) 59   Temp 97.4 F (36.3 C) (Oral)   Resp 18   Ht 5\' 1"  (1.549 m)   Wt 125 lb 3.2 oz (56.8 kg) Comment: scale b  SpO2 96%   BMI 23.66 kg/m   Physical Exam  Constitutional: She is oriented to person, place, and time. She appears well-developed and well-nourished. No distress.  HENT:  Head: Normocephalic and atraumatic.  Eyes: Conjunctivae are normal.  Neck: Neck supple.  Cardiovascular: Normal rate, regular rhythm and normal heart sounds.  Exam reveals no friction rub.   No murmur heard. Pulmonary/Chest: Effort normal and breath sounds normal. No respiratory distress. She has no wheezes. She has no rales.  Abdominal: She exhibits no distension.  Musculoskeletal: She exhibits no edema.  Neurological: She is alert and oriented to person, place, and time. She exhibits normal muscle tone.  Skin: Skin is warm.  Capillary refill takes less than 2 seconds.  Psychiatric: She has a normal mood and affect.  Nursing note and vitals reviewed.    ED Treatments / Results  Labs (all labs ordered are listed, but only abnormal results are displayed) Labs Reviewed  CBC - Abnormal; Notable for the following:       Result Value   RBC 3.49 (*)    Hemoglobin 10.2 (*)    HCT 32.4 (*)    All other components within normal limits  COMPREHENSIVE METABOLIC PANEL - Abnormal; Notable for the following:    Glucose, Bld 120 (*)    BUN 28 (*)    Creatinine, Ser 1.48 (*)    Calcium 8.8 (*)    Total Protein 5.9 (*)    Albumin 3.1 (*)    ALT 10 (*)    Total Bilirubin <0.1 (*)    GFR calc non Af Amer 30 (*)    GFR calc Af Amer 35 (*)    All other components within normal limits  I-STAT CHEM 8, ED - Abnormal; Notable for the following:    Chloride 99 (*)    BUN 35 (*)    Creatinine, Ser 1.50 (*)    Glucose, Bld 112 (*)    Hemoglobin 11.9 (*)    HCT 35.0 (*)    All other components within normal limits  BRAIN NATRIURETIC PEPTIDE  TROPONIN I  TROPONIN I  TROPONIN I  I-STAT TROPOININ, ED    EKG  EKG Interpretation  Date/Time:  Sunday September 02 2016 18:58:08 EDT Ventricular Rate:  53 PR Interval:    QRS Duration: 97 QT Interval:  471 QTC Calculation: 443 R Axis:   62 Text Interpretation:  Sinus bradycardia No acute changes No significant change since last tracing Confirmed by Jlynn Ly MD, Sheria Lang [phone]) on 09/02/2016 8:30:49 PM       Radiology Dg Chest 2 View  Result Date: 09/02/2016 CLINICAL DATA:  Chest pain and shortness of breath beginning at 4 p.m. today. EXAM: CHEST  2 VIEW COMPARISON:  PA and lateral chest 06/06/2016 and 09/05/2015. CT chest 07/01/2015. FINDINGS: Linear atelectasis or scar is seen in the right lung base and lingula. The lungs are emphysematous but clear. Cardiomegaly is identified. No pneumothorax or pleural effusion. Aortic atherosclerosis is noted. No focal bony  abnormality. IMPRESSION: No acute disease. Emphysema. Cardiomegaly. Atherosclerosis. Electronically Signed   By: Drusilla Kanner M.D.   On:  09/02/2016 20:07   US Abdomen Complete  Result Date: 09/03/2016 CLINICAL DATA:  AAA, status post cholecystectomy EXAM: ABDOMEN ULTRASOUND COMPLETE COMPARISON:  CT abdomen/pelvis dated 12/12/2015 FINDINGS: Gallbladder: Surgically absent. Common bile duct: Diameter: 9 mm Liver: No focal lesion identified. Within normal limits in parenchymal echogenicity. IVC: No abnormality visualized. Pancreas: Visualized portion unremarkable. Spleen: Size and appearance within normal limits. Right Kidney: Length: 9.4 cm. Cortical thinning. No hydronephrosis. Left Kidney: Length: 8.4 cm.  Cortical thinning.  No hydronephrosis. Abdominal aorta: 8.1 x 7.7 cm infrarenal abdominal aortic aneurysm with eccentric mural thrombus, previously 7.5 x 7.4 cm on CT. Other findings: None. IMPRESSION: 8.1 x 7.7 cm infrarenal abdominal aortic aneurysm, previously 7.5 x 7.4 cm on CT. Vascular surgery consultation recommended due to increased risk of rupture for AAA >5.5 cm. This recommendation follows ACR consensus guidelines: White Paper of the ACR Incidental Findings Committee II on Vascular Findings. J Am Coll Radiol 2013; 10:789-794. Electronically Signed   By: Charline Bills M.D.   On: 09/03/2016 09:20    Procedures Procedures (including critical care time)  Medications Ordered in ED Medications  nitroGLYCERIN (NITROSTAT) SL tablet 0.4 mg (not administered)  isosorbide mononitrate (IMDUR) 24 hr tablet 30 mg (30 mg Oral Given 09/03/16 0914)  meclizine (ANTIVERT) tablet 25 mg (not administered)  torsemide (DEMADEX) tablet 20 mg (20 mg Oral Given 09/03/16 0914)  gabapentin (NEURONTIN) capsule 100 mg (100 mg Oral Given 09/03/16 0914)  diazepam (VALIUM) tablet 10 mg (not administered)  albuterol (PROVENTIL) (2.5 MG/3ML) 0.083% nebulizer solution 2.5 mg (2.5 mg Nebulization Given 09/03/16 0753)    mometasone-formoterol (DULERA) 200-5 MCG/ACT inhaler 2 puff (2 puffs Inhalation Given 09/03/16 0753)  citalopram (CELEXA) tablet 20 mg (20 mg Oral Given 09/03/16 0041)  tiotropium (SPIRIVA) inhalation capsule 18 mcg (not administered)  acetaminophen (TYLENOL) tablet 650 mg (not administered)  ondansetron (ZOFRAN) injection 4 mg (not administered)  aspirin EC tablet 325 mg (325 mg Oral Given 09/03/16 0914)  oxyCODONE-acetaminophen (PERCOCET/ROXICET) 5-325 MG per tablet 1 tablet (not administered)    And  oxyCODONE (Oxy IR/ROXICODONE) immediate release tablet 5 mg (not administered)     Initial Impression / Assessment and Plan / ED Course  I have reviewed the triage vital signs and the nursing notes.  Pertinent labs & imaging results that were available during my care of the patient were reviewed by me and considered in my medical decision making (see chart for details).  Clinical Course   82 yo F with PMHx of HTN, HLD, CAD s/p MI, known AAA who p/w mild, substernal chest pressure with SOB. On arrival, VSS and WNL. Exam is as above, pt overall well-appearing and in NAD. Etiology of CP unclear - DDx includes ACS, GERD, MSK chest pain. Initial EKG unremarkable and trop negative x 1. Pain is not sharp, tearing, does not radiate to the back, and BP controlled - low suspicion for aortic dissection. She denies any abdominal pain, pulses symmetric, abdomen soft and CBC with normal Hgb - doubt ruptured AAA. Otherwise, initial labs unremarkable. CXR is clear. Will admit for high-risk CP evaluation, telemetry, further work-up. Pt is HDS.  Final Clinical Impressions(s) / ED Diagnoses   Final diagnoses:  Aortic aneurysm [facility])    New Prescriptions Current Discharge Medication List       Shaune Pollack, MD 09/03/16 1102

## 2016-09-02 NOTE — ED Triage Notes (Signed)
Per EMS:  Pt presents to ED for assessment of CP and SOB which began approx 2 hours ago. Pt sts she was "just sitting in her room" when it started.  Hx of afib, AAA, CHF and Stroke.  Pt on 4L [place] at home.  Pt sts pain was in the center of her chest radiating down the left arm.  Took 1 home nitro without relief.  Was given ASA and 1 additional nitro en route with current relief of pain.  Pt still c/o SOB.  Denies congestion, or recent exposure.

## 2016-09-03 ENCOUNTER — Observation Stay (HOSPITAL_COMMUNITY): Payer: Commercial Managed Care - HMO

## 2016-09-03 DIAGNOSIS — J42 Unspecified chronic bronchitis: Secondary | ICD-10-CM

## 2016-09-03 DIAGNOSIS — I714 Abdominal aortic aneurysm, without rupture: Secondary | ICD-10-CM | POA: Diagnosis not present

## 2016-09-03 DIAGNOSIS — N183 Chronic kidney disease, stage 3 (moderate): Secondary | ICD-10-CM | POA: Diagnosis not present

## 2016-09-03 LAB — TROPONIN I: Troponin I: <0.03 ng/mL (ref <0.03)

## 2016-09-03 MED ORDER — OXYCODONE HCL 5 MG PO TABS: 5.0000 mg | ORAL_TABLET | Freq: Four times a day (QID) | ORAL | Status: DC | PRN

## 2016-09-03 MED ORDER — OXYCODONE-ACETAMINOPHEN 5-325 MG PO TABS: 1.0000 | ORAL_TABLET | Freq: Four times a day (QID) | ORAL | Status: DC | PRN

## 2016-09-03 MED ORDER — SODIUM CHLORIDE 0.9 % IV SOLN
INTRAVENOUS | Status: AC
  Administered 2016-09-03: 12:00:00 via INTRAVENOUS

## 2016-09-03 NOTE — Care Management Note (Signed)
Case Management Note  Patient Details  Name: MARNESHA SCHIEFER MRN: [id number] Date of Birth: 26-Sep-1924  Subjective/Objective:   Pt presented for chest pain. Pt has support of son and an in home caregiver. Pt has DME RW, [address] @ 3L  and BSC.                Action/Plan: CM will continue to monitor for additional disposition needs.   Expected Discharge Date:  09/04/16               Expected Discharge Plan:  Home/Self Care  In-House Referral:     Discharge planning Services  CM Consult  Post Acute Care Choice:    Choice offered to:     DME Arranged:    DME Agency:     HH Arranged:    HH Agency:     Status of Service:  In process, will continue to follow  If discussed at Long Length of Stay Meetings, dates discussed:    Additional Comments:  Gala Lewandowsky, RN 09/03/2016, 12:35 PM

## 2016-09-03 NOTE — Care Management Obs Status (Signed)
MEDICARE OBSERVATION STATUS NOTIFICATION   Patient Details  Name: Melanie Lowe MRN: [id number] Date of Birth: [birth year]   Medicare Observation Status Notification Given:  Yes    Gala Lewandowsky, RN 09/03/2016, 12:35 PM

## 2016-09-03 NOTE — Progress Notes (Addendum)
PROGRESS NOTE                                                                                                                                                                                                             Patient Demographics:    Melanie Lowe, is a 82 y.o. female, DOB - [birth year], ZOX:[id number]  Admit date - 09/02/2016   Admitting Physician Eduard ClosArshad N Kakrakandy, MD  Outpatient Primary MD for the patient is Fredirick MaudlinHAWKINS,EDWARD L, MD  LOS - 0  Chief Complaint  Patient presents with  . Chest Pain  . Shortness of Breath       Brief Narrative    Melanie Lowe is a 82 y.o. female with abdominal aortic aneurysm, diastolic CHF, COPD presents to the ER because of chest pain. Patient's chest pain happened last evening while at home playing with cats. Pain was retrosternal radiating to the back. EMS was called and patient was brought to the ER and by the time patient reached ER chest pain resolved. Presently chest pain-free. EKG shows sinus bradycardia troponins were negative. Patient is being admitted for further observation and management.   ED Course: Chest x-ray was unremarkable. Troponin was negative. EKG shows sinus bradycardia.   Subjective:    Melanie Lowe today has, No headache, No chest pain, No abdominal pain - No Nausea, No new weakness tingling or numbness, No Cough - SOB.     Assessment  & Plan :     1.Atypical chest pain. Resolved, EKG nonacute except for mild sinus bradycardia, symptom free now, troponin negative 2 sets, echocardiogram pending. We'll have cardiology to evaluate likely outpatient stress test versus risk factor modulation.  2. Infrarenal AAA. Size is large and closed with 8 x 8 cm, vascular surgery requested to evaluate, pain-free, in the past has been referred to a tertiary care center, cannot use beta blocker due to bradycardia.  3. COPD. At baseline, continue inhalers and as needed  oxygen.  4. Chronic diastolic CHF. EF last 65% currently compensated, continue Imdur hold diuretic for #5 below.  5. ARF and CK D stage IV. Baseline creatinine close to 1.3, hold diuretic and hydrate. Repeat BMP in the morning    Family Communication  :  None  Code Status :  Full  Diet : Heart Healthy  Disposition Plan  :  Home  1-2 days  Consults  :  VVS, Cards  Procedures  :    TTE  Abdominal ultrasound. Infrarenal AAA close to 8 x 8 cm.  DVT Prophylaxis  :  SCDs  Lab Results  Component Value Date   PLT 206 09/02/2016    Inpatient Medications  Scheduled Meds: . albuterol  2.5 mg Nebulization QID  . aspirin EC  325 mg Oral Daily  . citalopram  20 mg Oral QHS  . gabapentin  100 mg Oral 5 X Daily  . isosorbide mononitrate  30 mg Oral Daily  . mometasone-formoterol  2 puff Inhalation BID  . tiotropium  18 mcg Inhalation QPM  . torsemide  20 mg Oral BID   Continuous Infusions:  PRN Meds:.acetaminophen, diazepam, meclizine, nitroGLYCERIN, ondansetron (ZOFRAN) IV, oxyCODONE-acetaminophen **AND** oxyCODONE  Antibiotics  :    Anti-infectives    None         Objective:   Vitals:   09/02/16 2242 09/03/16 0554 09/03/16 0756 09/03/16 1122  BP: (!) 148/96 (!) 145/94    Pulse: (!) 50 (!) 59    Resp: 18 18    Temp: 97.7 F (36.5 C) 97.4 F (36.3 C)    TempSrc: Oral Oral    SpO2: 100% 100% 96% 96%  Weight: 56.8 kg (125 lb 3.2 oz)     Height: 5\' 1"  (1.549 m)       Wt Readings from Last 3 Encounters:  09/02/16 56.8 kg (125 lb 3.2 oz)  08/17/16 54.9 kg (121 lb)  07/20/16 54.9 kg (121 lb)     Intake/Output Summary (Last 24 hours) at 09/03/16 1123 Last data filed at 09/03/16 1106  Gross per 24 hour  Intake              720 ml  Output              550 ml  Net              170 ml     Physical Exam  Awake Alert, Oriented X 3, No new F.N deficits, Normal affect [place].AT,PERRAL Supple Neck,No JVD, No cervical lymphadenopathy appriciated.  Symmetrical  Chest wall movement, Good air movement bilaterally, CTAB RRR,No Gallops,Rubs or new Murmurs, No Parasternal Heave +ve B.Sounds, Abd Soft, No tenderness, No organomegaly appriciated, No rebound - guarding or rigidity. No Cyanosis, Clubbing or edema, No new Rash or bruise      Data Review:    CBC  Recent Labs Lab 09/02/16 1903 09/02/16 1959  WBC 4.7  --   HGB 10.2* 11.9*  HCT 32.4* 35.0*  PLT 206  --   MCV 92.8  --   MCH 29.2  --   MCHC 31.5  --   RDW 14.5  --     Chemistries   Recent Labs Lab 09/02/16 1903 09/02/16 1959  NA 138 141  K 3.7 3.7  CL 105 99*  CO2 28  --   GLUCOSE 120* 112*  BUN 28* 35*  CREATININE 1.48* 1.50*  CALCIUM 8.8*  --   AST 17  --   ALT 10*  --   ALKPHOS 64  --   BILITOT <0.1*  --    ------------------------------------------------------------------------------------------------------------------ No results for input(s): CHOL, HDL, LDLCALC, TRIG, CHOLHDL, LDLDIRECT in the last 72 hours.  Lab Results  Component Value Date   HGBA1C (H) 01/09/2011    6.2 (NOTE)  According to the [place] Clinical Practice Recommendations for 2011, when HbA1c is used as a screening test:   >=6.5%   Diagnostic of Diabetes Mellitus           (if abnormal result  is confirmed)  5.7-6.4%   Increased risk of developing Diabetes Mellitus  References:Diagnosis and Classification of Diabetes Mellitus,Diabetes Care,2011,34(Suppl 1):S62-S69 and Standards of Medical Care in         Diabetes - 2011,Diabetes Care,2011,34  (Suppl 1):S11-S61.   ------------------------------------------------------------------------------------------------------------------ No results for input(s): TSH, T4TOTAL, T3FREE, THYROIDAB in the last 72 hours.  Invalid input(s): FREET3 ------------------------------------------------------------------------------------------------------------------ No results for input(s): VITAMINB12,  FOLATE, FERRITIN, TIBC, IRON, RETICCTPCT in the last 72 hours.  Coagulation profile No results for input(s): INR, PROTIME in the last 168 hours.  No results for input(s): DDIMER in the last 72 hours.  Cardiac Enzymes  Recent Labs Lab 09/03/16 0034 09/03/16 0554  TROPONINI <0.03 <0.03   ------------------------------------------------------------------------------------------------------------------    Component Value Date/Time   BNP 63.6 09/02/2016 1903    Micro Results No results found for this or any previous visit (from the past 240 hour(s)).  Radiology Reports Dg Chest 2 View  Result Date: 09/02/2016 CLINICAL DATA:  Chest pain and shortness of breath beginning at 4 p.m. today. EXAM: CHEST  2 VIEW COMPARISON:  PA and lateral chest 06/06/2016 and 09/05/2015. CT chest 07/01/2015. FINDINGS: Linear atelectasis or scar is seen in the right lung base and lingula. The lungs are emphysematous but clear. Cardiomegaly is identified. No pneumothorax or pleural effusion. Aortic atherosclerosis is noted. No focal bony abnormality. IMPRESSION: No acute disease. Emphysema. Cardiomegaly. Atherosclerosis. Electronically Signed   By: Drusilla Kanner M.D.   On: 09/02/2016 20:07   US Abdomen Complete  Result Date: 09/03/2016 CLINICAL DATA:  AAA, status post cholecystectomy EXAM: ABDOMEN ULTRASOUND COMPLETE COMPARISON:  CT abdomen/pelvis dated 12/12/2015 FINDINGS: Gallbladder: Surgically absent. Common bile duct: Diameter: 9 mm Liver: No focal lesion identified. Within normal limits in parenchymal echogenicity. IVC: No abnormality visualized. Pancreas: Visualized portion unremarkable. Spleen: Size and appearance within normal limits. Right Kidney: Length: 9.4 cm. Cortical thinning. No hydronephrosis. Left Kidney: Length: 8.4 cm.  Cortical thinning.  No hydronephrosis. Abdominal aorta: 8.1 x 7.7 cm infrarenal abdominal aortic aneurysm with eccentric mural thrombus, previously 7.5 x 7.4 cm on CT. Other  findings: None. IMPRESSION: 8.1 x 7.7 cm infrarenal abdominal aortic aneurysm, previously 7.5 x 7.4 cm on CT. Vascular surgery consultation recommended due to increased risk of rupture for AAA >5.5 cm. This recommendation follows ACR consensus guidelines: White Paper of the ACR Incidental Findings Committee II on Vascular Findings. J Am Coll Radiol 2013; 10:789-794. Electronically Signed   By: Charline Bills M.D.   On: 09/03/2016 09:20    Time Spent in minutes  30   Sherron Mapp K M.D on 09/03/2016 at 11:23 AM  Between 7am to 7pm - Pager - [phone]  After 7pm go to [email or web address] - password [facility]  [facility] -  Office  [phone]

## 2016-09-03 NOTE — Progress Notes (Signed)
[facility]  Patient Status: Active (receiving services up to time of hospitalization)  [facility] is providing the following services: PT, ST and MSW  If patient discharges after hours, please call [phone].   Melanie Lowe 09/03/2016, 1:27 PM

## 2016-09-03 NOTE — Consult Note (Signed)
Hospital Consult    Reason for Consult:  AAA Referring Physician:  Thedore MinsSingh, MD MRN #:  [id number]  History of Present Illness: This is a 82 y.o. female with known AAA [facility] and thought not to be a candidate for repair given size of 5.8cm and underlying cardiopulmonary disease. Now admitted overnight with chest pain now controlled. US of the abdomen was obtained 2/2 to known aneurysm and demonstrated enlargement up to 8cm. She denies any new abdominal or back pain and continues to eat well without changes in her bowel or bladder habits. She remains active although she requires 3L nasal cannula at rest. She takes asa but no anticoagulants.  Past Medical History:  Diagnosis Date  . AAA (abdominal aortic aneurysm) (HCC)   . Anxiety   . Arthritis   . CHF (congestive heart failure) (HCC) 11/09/2015  . COPD (chronic obstructive pulmonary disease) (HCC)   . Depression   . Hypertension   . Leg swelling   . Myocardial infarction (HCC)   . Neuropathy (HCC)   . Oxygen dependent   . Pneumonia   . Stroke [facility](HCC)     Past Surgical History:  Procedure Laterality Date  . APPENDECTOMY    . CHOLECYSTECTOMY      Allergies  Allergen Reactions  . Ciprofloxacin Hcl Nausea And Vomiting  . Nsaids     Per patients son due to AAA.  Can only take coated asprin  . Other     FOOD: Spice Triggers acid reflux  . Tomato     Triggers acid reflux    Prior to Admission medications   Medication Sig Start Date End Date Taking? Authorizing Provider  acetaminophen (TYLENOL) 500 MG tablet Take 250-500 mg by mouth daily as needed for mild pain or moderate pain. Reported on 04/11/2016   Yes Historical Provider, MD  albuterol (PROVENTIL) (2.5 MG/3ML) 0.083% nebulizer solution Take 2.5 mg by nebulization 4 (four) times daily.    Yes Historical Provider, MD  aspirin EC 81 MG tablet Take 81 mg by mouth daily.   Yes Historical Provider, MD  budesonide-formoterol (SYMBICORT) 160-4.5 MCG/ACT inhaler  Inhale 2 puffs into the lungs 2 (two) times daily.    Yes Historical Provider, MD  citalopram (CELEXA) 20 MG tablet Take 20 mg by mouth at bedtime.    Yes Historical Provider, MD  diazepam (VALIUM) 10 MG tablet Take 10 mg by mouth 3 (three) times daily as needed for anxiety.   Yes Historical Provider, MD  gabapentin (NEURONTIN) 100 MG capsule Take 100 mg by mouth 5 (five) times daily.  09/06/15  Yes Historical Provider, MD  isosorbide mononitrate (IMDUR) 30 MG 24 hr tablet Take 1 tablet (30 mg total) by mouth daily. 06/07/16  Yes Kari BaarsEdward Hawkins, MD  meclizine (ANTIVERT) 25 MG tablet Take 1 tablet (25 mg total) by mouth 3 (three) times daily as needed for dizziness. 05/14/16  Yes Ivery QualeHobson Bryant, PA-C  nitroGLYCERIN (NITROSTAT) 0.4 MG SL tablet Place 0.4 mg under the tongue every 5 (five) minutes as needed for chest pain. 08/17/16  Yes Historical Provider, MD  oxyCODONE-acetaminophen (PERCOCET) 10-325 MG tablet Take 1 tablet by mouth every 6 (six) hours as needed for pain.  12/21/15  Yes Historical Provider, MD  tiotropium (SPIRIVA) 18 MCG inhalation capsule Place 18 mcg into inhaler and inhale every evening.    Yes Historical Provider, MD  torsemide (DEMADEX) 20 MG tablet Take 1 tablet (20 mg total) by mouth 2 (two) times daily. 11/12/15  Yes Ramon DredgeEdward  Juanetta Gosling, MD    Social History   Social History  . Marital status: Widowed    Spouse name: N/A  . Number of children: N/A  . Years of education: N/A   Occupational History  . Not on file.   Social History Main Topics  . Smoking status: Former Smoker    Years: 50.00    Types: Cigarettes    Quit date: 12/31/2010  . Smokeless tobacco: Former Neurosurgeon    Quit date: 12/31/2010  . Alcohol use No  . Drug use: No  . Sexual activity: No   Other Topics Concern  . Not on file   Social History Narrative  . No narrative on file     Family History  Problem Relation Age of Onset  . Diabetes Brother   . Cancer Brother     Throat cancer, Heart attack  .  Heart disease Brother   . Heart attack Brother   . Cancer Father   . Cancer Sister     ROS: [x]  Positive   [ ]  Negative   [ ]  All sytems reviewed and are negative  Cardiovascular: [x]  chest pain/pressure []  palpitations []  SOB lying flat []  DOE []  pain in legs while walking []  pain in legs at rest []  pain in legs at night []  non-healing ulcers []  hx of DVT []  swelling in legs  Pulmonary: []  productive cough [x]  asthma/wheezing [x]  home O2  Neurologic: []  weakness in []  arms []  legs []  numbness in []  arms []  legs []  hx of CVA []  mini stroke [] difficulty speaking or slurred speech []  temporary loss of vision in one eye []  dizziness  Hematologic: []  hx of cancer []  bleeding problems []  problems with blood clotting easily  Endocrine:   []  diabetes []  thyroid disease  GI []  vomiting blood []  blood in stool  GU: []  CKD/renal failure []  HD--[]  M/W/F or []  T/T/S []  burning with urination []  blood in urine  Psychiatric: []  anxiety []  depression  Musculoskeletal: []  arthritis []  joint pain  Integumentary: []  rashes []  ulcers  Constitutional: []  fever []  chills   Physical Examination  Vitals:   09/02/16 2242 09/03/16 0554  BP: (!) 148/96 (!) 145/94  Pulse: (!) 50 (!) 59  Resp: 18 18  Temp: 97.7 F (36.5 C) 97.4 F (36.3 C)   Body mass index is 23.66 kg/m.  General:  WDWN in NAD Gait: Not observed HENT: WNL, normocephalic Pulmonary: normal non-labored breathing on O2 Cardiac: regular Abdomen:  soft, NT/ND, palpable mass upper abdomen Vascular Exam/Pulses:  Right Left  Radial 2+ (normal) 2+ (normal)  Femoral 2+ (normal) 2+ (normal)  Popliteal 2+ (normal) 2+ (normal)  DP 2+ (normal) 2+ (normal)  PT 2+ (normal) 2+ (normal)   Extremities: no cce Musculoskeletal: no muscle wasting or atrophy  Neurologic: A&O X 3; Appropriate Affect ; SENSATION: normal; MOTOR FUNCTION:  moving all extremities equally. Speech is fluent/normal  CBC      Component Value Date/Time   WBC 4.7 09/02/2016 1903   RBC 3.49 (L) 09/02/2016 1903   HGB 11.9 (L) 09/02/2016 1959   HCT 35.0 (L) 09/02/2016 1959   PLT 206 09/02/2016 1903   MCV 92.8 09/02/2016 1903   MCH 29.2 09/02/2016 1903   MCHC 31.5 09/02/2016 1903   RDW 14.5 09/02/2016 1903   LYMPHSABS 1.4 06/06/2016 2100   MONOABS 0.4 06/06/2016 2100   EOSABS 0.2 06/06/2016 2100   BASOSABS 0.0 06/06/2016 2100    BMET    Component Value Date/Time  NA 141 09/02/2016 1959   K 3.7 09/02/2016 1959   CL 99 (L) 09/02/2016 1959   CO2 28 09/02/2016 1903   GLUCOSE 112 (H) 09/02/2016 1959   BUN 35 (H) 09/02/2016 1959   CREATININE 1.50 (H) 09/02/2016 1959   CALCIUM 8.8 (L) 09/02/2016 1903   GFRNONAA 30 (L) 09/02/2016 1903   GFRAA 35 (L) 09/02/2016 1903    COAGS: Lab Results  Component Value Date   INR 1.26 09/06/2015   INR 1.25 11/22/2014   INR 1.31 11/12/2014     Non-Invasive Vascular Imaging:   IMPRESSION: 8.1 x 7.7 cm infrarenal abdominal aortic aneurysm, previously 7.5 x 7.4 cm on CT.  Vascular surgery consultation recommended due to increased risk of rupture for AAA >5.5 cm. This recommendation follows ACR consensus guidelines: White Paper of the ACR Incidental Findings Committee II on Vascular Findings. J Am Coll Radiol 2013; 10:789-794.     ASSESSMENT/PLAN: This is a 81 y.o. female with history of pararenal AAA previously evaluated by [facility] and thought not to be a candidate for repair given size at 5.8cm at the time relative to other co-morbid conditions. She is now here with chest pain that has resolved. She has CHF and COPD on home O2 3 liters continuously and would be high risk for any procedure. The CT scan itself to evaluate the aneurysm would be high risk as well given her elevated Cr. I have discussed the natural course of this disease with a likelihood of rupture in the 50% range within the next year. She understands this is a tough situation given that rupture would  mean almost certain death and intervention could have multiple complications including cardiopulmonary failure and/or renal failure. She desires to discuss with her family prior to obtaining CT scan which I think is prudent at this time.   Eloni Darius C. Randie Heinz, MD [facility] Office: [phone] Pager: [phone]

## 2016-09-04 ENCOUNTER — Observation Stay (HOSPITAL_COMMUNITY): Payer: Commercial Managed Care - HMO

## 2016-09-04 ENCOUNTER — Other Ambulatory Visit: Payer: Self-pay

## 2016-09-04 ENCOUNTER — Other Ambulatory Visit (HOSPITAL_COMMUNITY): Payer: Commercial Managed Care - HMO

## 2016-09-04 DIAGNOSIS — I5032 Chronic diastolic (congestive) heart failure: Secondary | ICD-10-CM | POA: Diagnosis not present

## 2016-09-04 DIAGNOSIS — Z515 Encounter for palliative care: Secondary | ICD-10-CM | POA: Diagnosis not present

## 2016-09-04 DIAGNOSIS — Z7189 Other specified counseling: Secondary | ICD-10-CM

## 2016-09-04 DIAGNOSIS — I714 Abdominal aortic aneurysm, without rupture: Secondary | ICD-10-CM | POA: Diagnosis not present

## 2016-09-04 DIAGNOSIS — R072 Precordial pain: Secondary | ICD-10-CM

## 2016-09-04 DIAGNOSIS — N183 Chronic kidney disease, stage 3 (moderate): Secondary | ICD-10-CM | POA: Diagnosis not present

## 2016-09-04 DIAGNOSIS — J42 Unspecified chronic bronchitis: Secondary | ICD-10-CM | POA: Diagnosis not present

## 2016-09-04 LAB — BASIC METABOLIC PANEL
ANION GAP: 4 — AB (ref 5–15)
BUN: 28 mg/dL — ABNORMAL HIGH (ref 6–20)
CHLORIDE: 108 mmol/L (ref 101–111)
CO2: 29 mmol/L (ref 22–32)
Calcium: 9.1 mg/dL (ref 8.9–10.3)
Creatinine, Ser: 1.09 mg/dL — ABNORMAL HIGH (ref 0.44–1.00)
GFR, EST AFRICAN AMERICAN: 50 mL/min — AB (ref >60)
GFR, EST NON AFRICAN AMERICAN: 43 mL/min — AB (ref >60)
GLUCOSE: 80 mg/dL (ref 65–99)
POTASSIUM: 4 mmol/L (ref 3.5–5.1)
Sodium: 141 mmol/L (ref 135–145)

## 2016-09-04 NOTE — Consult Note (Signed)
Cardiology Consult    Patient ID: Melanie Lowe MRN: [id number], DOB/AGE: 1924/10/16   Admit date: 09/02/2016 Date of Consult: 09/04/2016  Primary Physician: Fredirick Maudlin, MD Reason for Consult: Chest Pain Primary Cardiologist: Previously seen by Dr. Dietrich Pates in 2012 Requesting Provider: Dr. Thedore Mins   History of Present Illness    Melanie Lowe is a 82 y.o. female with past medical history of COPD (on 3L [place]), chronic diastolic CHF, CVA, and AAA (5.8 cm in 2013 and not felt to be a surgical candidate) who presented to [facility] ED on 09/02/2016 for evaluation of chest pain.   She reports developing sudden onset chest pressure on the day of presentation while playing with her dog. It was associated with worsening dyspnea and partially relieved with SL NTG. By the time of arrival to [facility], her symptoms had completely resolved. She denies any repeat episodes of chest discomfort since being admitted. Has ambulated around her room without difficulty.   She is active at baseline for her age, walking daily to the mailbox and around her home without assistive devices. She lives with her son who helps with ADL's but she is able to perform these independently as well.   While admitted, labs have shown a WBC of 4.7, Hgb 10.2, and platelets 206.  Creatinine 1.48 (was 1.27 three months ago). BNP 63.6. EKG shows sinus bradycardia, HR 53, with no acute ST or T-wave changes. CXR with no acute disease. Emphysema, cardiomegaly, and atherosclerosis noted. Cyclic troponin values have been negative.   Abdominal US showed a 8.1 x 7.7 cm infrarenal abdominal aortic aneurysm, previously 7.5 x 7.4 cm on CT in 12/2015. Vascular Surgery was consulted yesterday and she was thought to be high-risk for any procedure. She wanted to talk with her family about options, as the risk of rupture within the next year is close to 50%.  She is currently talking to Palliative Care this morning and is wishing to go home with Home Hospice  and not pursue surgical options. She reports being ready "to go home when it is her time".   Recent echocardiogram in 05/2016 showed an EF of 65% to 70% with no regional wall motion abnormalities. Grade 1 DD noted with mild AR and mild TR. PA Peak pressure elevated to 38 mm Hg (S). She denies any recent episodes of worsening dyspnea with exertion or orthopnea. Reports consuming a low-sodium diet. Was on Torsemide 20mg  BID prior to admission but this was held in the setting of AKI.    Past Medical History   Past Medical History:  Diagnosis Date  . AAA (abdominal aortic aneurysm) (HCC)   . Anxiety   . Arthritis   . CHF (congestive heart failure) (HCC) 11/09/2015  . COPD (chronic obstructive pulmonary disease) (HCC)   . Depression   . Hypertension   . Leg swelling   . Myocardial infarction (HCC)   . Neuropathy (HCC)   . Oxygen dependent   . Pneumonia   . Stroke [facility])     Past Surgical History:  Procedure Laterality Date  . APPENDECTOMY    . CHOLECYSTECTOMY       Allergies  Allergies  Allergen Reactions  . Ciprofloxacin Hcl Nausea And Vomiting  . Nsaids     Per patients son due to AAA.  Can only take coated asprin  . Other     FOOD: Spice Triggers acid reflux  . Tomato     Triggers acid reflux    Inpatient Medications    .  albuterol  2.5 mg Nebulization QID  . aspirin EC  325 mg Oral Daily  . citalopram  20 mg Oral QHS  . gabapentin  100 mg Oral 5 X Daily  . isosorbide mononitrate  30 mg Oral Daily  . mometasone-formoterol  2 puff Inhalation BID  . tiotropium  18 mcg Inhalation QPM    Family History    Family History  Problem Relation Age of Onset  . Diabetes Brother   . Cancer Brother     Throat cancer, Heart attack  . Heart disease Brother   . Heart attack Brother   . Cancer Father   . Cancer Sister     Social History    Social History   Social History  . Marital status: Widowed    Spouse name: N/A  . Number of children: N/A  . Years of  education: N/A   Occupational History  . Not on file.   Social History Main Topics  . Smoking status: Former Smoker    Years: 50.00    Types: Cigarettes    Quit date: 12/31/2010  . Smokeless tobacco: Former NeurosurgeonUser    Quit date: 12/31/2010  . Alcohol use No  . Drug use: No  . Sexual activity: No   Other Topics Concern  . Not on file   Social History Narrative  . No narrative on file     Review of Systems    General:  No chills, fever, night sweats or weight changes.  Cardiovascular:  No edema, orthopnea, palpitations, paroxysmal nocturnal dyspnea. Positive for chest pain and baseline dyspnea.  Dermatological: No rash, lesions/masses Respiratory: No cough, Positive for dyspnea. Urologic: No hematuria, dysuria Abdominal:   No nausea, vomiting, diarrhea, bright red blood per rectum, melena, or hematemesis Neurologic:  No visual changes, wkns, changes in mental status. All other systems reviewed and are otherwise negative except as noted above.  Physical Exam    Blood pressure 120/71, pulse (!) 51, temperature 97.5 F (36.4 C), temperature source Oral, resp. rate 18, height 5\' 1"  (1.549 m), weight 126 lb (57.2 kg), SpO2 95 %.  General: Pleasant, elderly female appearing in NAD. Psych: Normal affect. Neuro: Alert and oriented X 3. Moves all extremities spontaneously. HEENT: Normal  Neck: Supple without bruits or JVD. Lungs:  Resp regular and unlabored, CTA without wheezing or rales. Heart: RRR no s3, s4, or murmurs. Abdomen: Soft, non-tender, non-distended, BS + x 4.  Extremities: No clubbing, cyanosis or edema. Varicose veins noted. DP/PT/Radials 2+ and equal bilaterally.  Labs    Troponin [facility](Point of Care Test)  Recent Labs  09/02/16 1908  TROPIPOC 0.01    Recent Labs  09/03/16 0034 09/03/16 0554 09/03/16 1242  TROPONINI <0.03 <0.03 <0.03   Lab Results  Component Value Date   WBC 4.7 09/02/2016   HGB 11.9 (L) 09/02/2016   HCT 35.0 (L) 09/02/2016   MCV 92.8  09/02/2016   PLT 206 09/02/2016    Recent Labs Lab 09/02/16 1903  09/04/16 0413  NA 138  < > 141  K 3.7  < > 4.0  CL 105  < > 108  CO2 28  --  29  BUN 28*  < > 28*  CREATININE 1.48*  < > 1.09*  CALCIUM 8.8*  --  9.1  PROT 5.9*  --   --   BILITOT <0.1*  --   --   ALKPHOS 64  --   --   ALT 10*  --   --  AST 17  --   --   GLUCOSE 120*  < > 80  < > = values in this interval not displayed. Lab Results  Component Value Date   CHOL 176 08/02/2011   HDL 46 08/02/2011   LDLCALC 97 08/02/2011   TRIG 167 (H) 08/02/2011   Lab Results  Component Value Date   DDIMER 2.05 (H) 09/06/2015     Radiology Studies    Dg Chest 2 View  Result Date: 09/02/2016 CLINICAL DATA:  Chest pain and shortness of breath beginning at 4 p.m. today. EXAM: CHEST  2 VIEW COMPARISON:  PA and lateral chest 06/06/2016 and 09/05/2015. CT chest 07/01/2015. FINDINGS: Linear atelectasis or scar is seen in the right lung base and lingula. The lungs are emphysematous but clear. Cardiomegaly is identified. No pneumothorax or pleural effusion. Aortic atherosclerosis is noted. No focal bony abnormality. IMPRESSION: No acute disease. Emphysema. Cardiomegaly. Atherosclerosis. Electronically Signed   By: Drusilla Kanner M.D.   On: 09/02/2016 20:07   US Abdomen Complete  Result Date: 09/03/2016 CLINICAL DATA:  AAA, status post cholecystectomy EXAM: ABDOMEN ULTRASOUND COMPLETE COMPARISON:  CT abdomen/pelvis dated 12/12/2015 FINDINGS: Gallbladder: Surgically absent. Common bile duct: Diameter: 9 mm Liver: No focal lesion identified. Within normal limits in parenchymal echogenicity. IVC: No abnormality visualized. Pancreas: Visualized portion unremarkable. Spleen: Size and appearance within normal limits. Right Kidney: Length: 9.4 cm. Cortical thinning. No hydronephrosis. Left Kidney: Length: 8.4 cm.  Cortical thinning.  No hydronephrosis. Abdominal aorta: 8.1 x 7.7 cm infrarenal abdominal aortic aneurysm with eccentric mural  thrombus, previously 7.5 x 7.4 cm on CT. Other findings: None. IMPRESSION: 8.1 x 7.7 cm infrarenal abdominal aortic aneurysm, previously 7.5 x 7.4 cm on CT. Vascular surgery consultation recommended due to increased risk of rupture for AAA >5.5 cm. This recommendation follows ACR consensus guidelines: White Paper of the ACR Incidental Findings Committee II on Vascular Findings. J Am Coll Radiol 2013; 10:789-794. Electronically Signed   By: Charline Bills M.D.   On: 09/03/2016 09:20    EKG & Cardiac Imaging    EKG: Sinus bradycardia, HR 53, with no acute ST or T-wave changes.   Echocardiogram: 05/2016 Study Conclusions  - Left ventricle: The cavity size was normal. Wall thickness was   increased in a pattern of moderate LVH. Systolic function was   vigorous. The estimated ejection fraction was in the range of 65%   to 70%. Wall motion was normal; there were no regional wall   motion abnormalities. Doppler parameters are consistent with   abnormal left ventricular relaxation (grade 1 diastolic   dysfunction). Indeterminate filling pressures. - Aortic valve: There was mild regurgitation. - Aorta: Moderate aortic root enlargement. Aortic root dimension:   48 mm (ED). - Left atrium: The atrium was mildly dilated. - Tricuspid valve: There was mild regurgitation. - Pulmonary arteries: Systolic pressure was mildly increased. PA   peak pressure: 38 mm Hg (S).  Assessment & Plan    1. Chest Pain  - on day of admission, she presented with sudden onset chest pressure while playing with her dog, associated with worsening dyspnea and partially relieved with SL NTG. By the time of arrival to [facility], her symptoms had completely resolved (within 30 minutes). Denies any repeat episodes of chest discomfort since being admitted and has ambulated around her room without difficulty.  - active at baseline for her age, walking daily to the mailbox and around her home without assistive devices. She lives with  her son who helps  with ADL's but she is able to perform these independently as well.  - EKG shows sinus bradycardia, HR 53, with no acute ST or T-wave changes. Cyclic troponin values have been negative. Echo in 05/2016 showed an EF of 65% to 70% with no regional wall motion abnormalities. NST in 08/2011 showing perfusion imaging is most consistent with soft tissue attenuation affecting the anteroseptal wall, without clear evidence of ischemia - with her 8.1cm AAA, Palliative Care has been consulted and she wishes to go home and to not pursue surgical interventions for which she would be high risk. No further cardiac testing indicated at this time and patient does not wish to have any invasive evaluations. Will cancel echo ordered this admission, as she just had one in 05/2016 and this is not going to change the current course of action.  - continue 81mg  ASA and Imdur. No BB secondary to COPD.   2. AAA - Abdominal US showed a 8.1 x 7.7 cm infrarenal abdominal aortic aneurysm, previously 7.5 x 7.4 cm on CT in 12/2015. Vascular Surgery was consulted yesterday and she was thought to be high-risk for any procedure. She wanted to talk with her family about options, as the risk of rupture within the next year is close to 50%. - Palliative Care consulted and possibly going home with Hospice. The patient does not wish to have any surgical interventions at this time.   3. Chronic Diastolic CHF - echo in 05/2016 showed an EF of 65% to 70% with no regional wall motion abnormalities. Grade 1 DD noted with mild AR and mild TR. PA Peak pressure elevated to 38 mm Hg (S).  - does not appear acutely volume overloaded on physical exam. BNP 63.6 on admission.  - was on Torsemide 20mg  BID on admission. Would resume on discharge with repeat BMET with PCP in 1 week. May need dose reduction to 20mg  daily.   4. COPD - on 3L [place] baseline at home  5. AKI - creatinine elevated to 1.48 on admission, improved to 1.09 today.    Signed, Ellsworth Lennox, PA-C 09/04/2016, 9:06 AM Pager: [phone]   I have personally seen and examined this patient with Randall An, PA-C. I agree with the assessment and plan as outlined above. She had an isolated episode of chest pain. No recurrence. No objective evidence of ischemia. LV function normal by echo June 2017. Exam shows a thin elderly female, alert and oriented. CV: RRR. Lungs:clear, Ext: no edema. Labs reviewed. She also has a large AAA. No plans for surgery given age. Home today. No further cardiac workup is planned.   Verne Carrow 09/04/2016 11:06 AM

## 2016-09-04 NOTE — Care Management Note (Addendum)
Case Management Note  Patient Details  Name: Melanie Lowe MRN: [id number] Date of Birth: 11-04-1924  Subjective/Objective:    Chest pain, AAA, COPD                Action/Plan: Discharge Planning: AVS reviewed:  NCM spoke to pt and gave permission to speak to son, Melanie Lowe # [phone]. Contacted son via phone. Son states he has discussed Hospice with pt in the past and she does not want Hospice. Explained NCM will speak to him when he arrives. Pt is active with [facility] for HH PT, and SW. Son states he does not want HHPT but does want HH RN. NCM contacted [facility] with update. HH RN ordered and to cancel HHPT. Pt has RW, bedside commode and wheelchair at home. They have hospital bed being shipped to home. It was order prior to hospitalization.   Will fax dc summary and ultrasound to PCP's office.    PCP - Kari Baars MD  Expected Discharge Date:  09/04/16               Expected Discharge Plan:  Home w Home Health Services  In-House Referral:  Clinical Social Work, NA  Discharge planning Services  CM Consult  Post Acute Care Choice:  Home Health Choice offered to:  Adult Children  DME Arranged:  N/A DME Agency:  NA  HH Arranged:  RN, Social Work [employer] Agency:  [facility]  Status of Service:  Completed, signed off  If discussed at [employer], dates discussed:    Additional Comments:  Elliot Cousin, RN 09/04/2016, 11:18 AM   09/04/2016 1:15 pm Spoke to son at bedside. He is refusing Palliative Care at home.

## 2016-09-04 NOTE — Consult Note (Signed)
Consultation Note Date: 09/04/2016   Patient Name: Melanie Lowe  DOB: 10/23/1924  MRN: [id number]  Age / Sex: 82 y.o., female  PCP: Kari Baars, MD Referring Physician: Leroy Sea, MD  Reason for Consultation: Establishing goals of care  HPI/Patient Profile: 82 y.o. female  with past medical history of  COPD (oxygen dependent), diastolic HF, AAA, and stroke who was admitted on 09/02/2016 with chest pain.  She has been seen by cardiology.  Her troponins have been essentially normal. Cardiology is recommending medical management.   She has also been evaluated by vascular surgery for her AAA.  They have estimated a 50% probability of AAA rupture in the next year.  Mrs. Davison understands this.  She is not a candidate for surgery, nor does she want surgery.   Clinical Assessment and Goals of Care: I spoke with the patient at bedside and her son Dayton Scrape on the phone.  The patient lives at home with her son and appears to have excellent support.  She has limited mobility due to SOB, but is able to get around on her own.  She is on 3L of oxygen chronically.  She talks a lot about the loss of her husband 8 years ago - he was a Statistician.  She has lost all of her siblings and her mother.  She states the only thing left for her is "going home".  Ms. Curnow states that some days she thinks of taking her own life.  Despite these feelings of sadness she seems generally happy and loving and enjoys engaging with people.  She states she would like to avoid coming to the hospital if possible.  She does not want any invasive procedures.  She has elected to be a DNR. We discussed hospice services and she is interested in learning more.  I then spoke with her son [name] on the phone.  He understands her medical situation and her mind set.  He would not be surprised if she were to pass in the next year.  Patient is her own  Management consultant.    SUMMARY OF RECOMMENDATIONS    Code Status/Advance Care Planning:  DNR    Symptom Management:   Patient to go home with sublingual nitro per cards.   Psycho-social/Spiritual:   Desire for further Chaplaincy support:yes  Additional Recommendations: Caregiving  Support/Resources  Prognosis:   < 6 months in the setting of a massive AAA (8x8 cm), recurrent pneumonias necessitating recurrent hospitalizations, COPD on chronic oxygen.  Discharge Planning: Home with Hospice vs Home with Palliative Care.  Hospice to evaluate.     Primary Diagnoses: Present on Admission: . Chest pain . COPD (chronic obstructive pulmonary disease) (HCC) . CKD (chronic kidney disease) stage 3, GFR 30-59 ml/min . Diastolic dysfunction   I have reviewed the medical record, interviewed the patient and family, and examined the patient. The following aspects are pertinent.  Past Medical History:  Diagnosis Date  . AAA (abdominal aortic aneurysm) (HCC)   . Anxiety   .  Arthritis   . CHF (congestive heart failure) (HCC) 11/09/2015  . COPD (chronic obstructive pulmonary disease) (HCC)   . Depression   . Hypertension   . Leg swelling   . Myocardial infarction (HCC)   . Neuropathy (HCC)   . Oxygen dependent   . Pneumonia   . Stroke [facility](HCC)    Social History   Social History  . Marital status: Widowed    Spouse name: N/A  . Number of children: N/A  . Years of education: N/A   Social History Main Topics  . Smoking status: Former Smoker    Years: 50.00    Types: Cigarettes    Quit date: 12/31/2010  . Smokeless tobacco: Former NeurosurgeonUser    Quit date: 12/31/2010  . Alcohol use No  . Drug use: No  . Sexual activity: No   Other Topics Concern  . None   Social History Narrative  . None   Family History  Problem Relation Age of Onset  . Diabetes Brother   . Cancer Brother     Throat cancer, Heart attack  . Heart disease Brother   . Heart attack Brother   . Cancer Father     . Cancer Sister    Scheduled Meds: . albuterol  2.5 mg Nebulization QID  . aspirin EC  325 mg Oral Daily  . citalopram  20 mg Oral QHS  . gabapentin  100 mg Oral 5 X Daily  . isosorbide mononitrate  30 mg Oral Daily  . mometasone-formoterol  2 puff Inhalation BID  . tiotropium  18 mcg Inhalation QPM   Continuous Infusions:  PRN Meds:.acetaminophen, diazepam, meclizine, nitroGLYCERIN, ondansetron (ZOFRAN) IV, oxyCODONE-acetaminophen **AND** oxyCODONE Medications Prior to Admission:  Prior to Admission medications   Medication Sig Start Date End Date Taking? Authorizing Provider  acetaminophen (TYLENOL) 500 MG tablet Take 250-500 mg by mouth daily as needed for mild pain or moderate pain. Reported on 04/11/2016   Yes Historical Provider, MD  albuterol (PROVENTIL) (2.5 MG/3ML) 0.083% nebulizer solution Take 2.5 mg by nebulization 4 (four) times daily.    Yes Historical Provider, MD  aspirin EC 81 MG tablet Take 81 mg by mouth daily.   Yes Historical Provider, MD  budesonide-formoterol (SYMBICORT) 160-4.5 MCG/ACT inhaler Inhale 2 puffs into the lungs 2 (two) times daily.    Yes Historical Provider, MD  citalopram (CELEXA) 20 MG tablet Take 20 mg by mouth at bedtime.    Yes Historical Provider, MD  diazepam (VALIUM) 10 MG tablet Take 10 mg by mouth 3 (three) times daily as needed for anxiety.   Yes Historical Provider, MD  gabapentin (NEURONTIN) 100 MG capsule Take 100 mg by mouth 5 (five) times daily.  09/06/15  Yes Historical Provider, MD  isosorbide mononitrate (IMDUR) 30 MG 24 hr tablet Take 1 tablet (30 mg total) by mouth daily. 06/07/16  Yes Kari BaarsEdward Hawkins, MD  meclizine (ANTIVERT) 25 MG tablet Take 1 tablet (25 mg total) by mouth 3 (three) times daily as needed for dizziness. 05/14/16  Yes Ivery QualeHobson Bryant, PA-C  nitroGLYCERIN (NITROSTAT) 0.4 MG SL tablet Place 0.4 mg under the tongue every 5 (five) minutes as needed for chest pain. 08/17/16  Yes Historical Provider, MD  oxyCODONE-acetaminophen  (PERCOCET) 10-325 MG tablet Take 1 tablet by mouth every 6 (six) hours as needed for pain.  12/21/15  Yes Historical Provider, MD  tiotropium (SPIRIVA) 18 MCG inhalation capsule Place 18 mcg into inhaler and inhale every evening.    Yes Historical Provider, MD  torsemide (DEMADEX) 20 MG tablet Take 1 tablet (20 mg total) by mouth 2 (two) times daily. 11/12/15  Yes Kari Baars, MD   Allergies  Allergen Reactions  . Ciprofloxacin Hcl Nausea And Vomiting  . Nsaids     Per patients son due to AAA.  Can only take coated asprin  . Other     FOOD: Spice Triggers acid reflux  . Tomato     Triggers acid reflux   Review of Systems:  No cp, no SOB, no anorexia, no changes in bowel habits or dysuria.    Physical Exam  Elderly woman, pleasant, A&O CV rrr Resp diminished on the right.  No w/c/r Abdomen:  Firm, not distended Ext.  No edema.  Able to move all 4.  Vital Signs: BP 120/71 (BP Location: Left Arm)   Pulse (!) 51   Temp 97.5 F (36.4 C) (Oral)   Resp 18   Ht 5\' 1"  (1.549 m)   Wt 57.2 kg (126 lb) Comment: scale b  SpO2 93%   BMI 23.81 kg/m  Pain Assessment: No/denies pain   Pain Score: 0-No pain   SpO2: SpO2: 93 % O2 Device:SpO2: 93 % O2 Flow Rate: .O2 Flow Rate (L/min): 3 L/min  IO: Intake/output summary:   Intake/Output Summary (Last 24 hours) at 09/04/16 1012 Last data filed at 09/04/16 6295  Gross per 24 hour  Intake             1585 ml  Output             2300 ml  Net             -715 ml    LBM: Last BM Date: 09/02/16 Baseline Weight: Weight: 56.2 kg (124 lb) Most recent weight: Weight: 57.2 kg (126 lb) (scale b)     Palliative Assessment/Data:   Flowsheet Rows   Flowsheet Row Most Recent Value  Intake Tab  Unit at Time of Referral  Cardiac/Telemetry Unit  Palliative Care Primary Diagnosis  Cardiac  Palliative Care Type  New Palliative care  Reason for referral  Clarify Goals of Care  Date of Admission  09/02/16  Date first seen by Palliative Care   09/04/16  Clinical Assessment  Palliative Performance Scale Score  50%  Psychosocial & Spiritual Assessment  Palliative Care Outcomes  Patient/Family meeting held?  Yes  Who was at the meeting?  patient.  Then son by phone.  Palliative Care follow-up planned  Yes, Home      Time In: 8:50 Time Out:  10:00 Time Total: 70 min. Greater than 50%  of this time was spent counseling and coordinating care related to the above assessment and plan.  Signed by: Algis Downs, PA-C Palliative Medicine Pager: [phone]   Please contact Palliative Medicine Team phone at [phone] for questions and concerns.  For individual provider: See Loretha Stapler

## 2016-09-04 NOTE — Progress Notes (Signed)
Pt d/c to home this afternoon after given  Report.    D/c off floor via w/c to awaiting transport.  Amanda Pea, Charity fundraiser.

## 2016-09-04 NOTE — Discharge Instructions (Signed)
Follow with Primary MD HAWKINS,EDWARD L, MD in 3-4 days , review your abdominal ultrasound findings and show the discharge summary in detail to the physician  Get CBC, CMP, 2 view Chest X ray checked  by Primary MD or SNF MD in 5-7 days ( we routinely change or add medications that can affect your baseline labs and fluid status, therefore we recommend that you get the mentioned basic workup next visit with your PCP, your PCP may decide not to get them or add new tests based on their clinical decision)   Activity: As tolerated with Full fall precautions use walker/cane & assistance as needed   Disposition Home     Diet:   Heart Healthy   For Heart failure patients - Check your Weight same time everyday, if you gain over 2 pounds, or you develop in leg swelling, experience more shortness of breath or chest pain, call your Primary MD immediately. Follow Cardiac Low Salt Diet and 1.5 lit/day fluid restriction.   On your next visit with your primary care physician please Get Medicines reviewed and adjusted.   Please request your Prim.MD to go over all Hospital Tests and Procedure/Radiological results at the follow up, please get all Hospital records sent to your Prim MD by signing hospital release before you go home.   If you experience worsening of your admission symptoms, develop shortness of breath, life threatening emergency, suicidal or homicidal thoughts you must seek medical attention immediately by calling 911 or calling your MD immediately  if symptoms less severe.  You Must read complete instructions/literature along with all the possible adverse reactions/side effects for all the Medicines you take and that have been prescribed to you. Take any new Medicines after you have completely understood and accpet all the possible adverse reactions/side effects.   Do not drive, operate heavy machinery, perform activities at heights, swimming or participation in water activities or provide baby  sitting services if your were admitted for syncope or siezures until you have seen by Primary MD or a Neurologist and advised to do so again.  Do not drive when taking Pain medications.    Do not take more than prescribed Pain, Sleep and Anxiety Medications  Special Instructions: If you have smoked or chewed Tobacco  in the last 2 yrs please stop smoking, stop any regular Alcohol  and or any Recreational drug use.  Wear Seat belts while driving.   Please note  You were cared for by a hospitalist during your hospital stay. If you have any questions about your discharge medications or the care you received while you were in the hospital after you are discharged, you can call the unit and asked to speak with the hospitalist on call if the hospitalist that took care of you is not available. Once you are discharged, your primary care physician will handle any further medical issues. Please note that NO REFILLS for any discharge medications will be authorized once you are discharged, as it is imperative that you return to your primary care physician (or establish a relationship with a primary care physician if you do not have one) for your aftercare needs so that they can reassess your need for medications and monitor your lab values.

## 2016-09-04 NOTE — Discharge Summary (Signed)
Melanie Lowe ZOX:[id number] DOB: [birth year] DOA: 09/02/2016  PCP: Fredirick Maudlin, MD  Admit date: 09/02/2016  Discharge date: 09/04/2016  Admitted From: Home   Disposition:  Home   Recommendations for Outpatient Follow-up:   Follow up with PCP in 1-2 weeks  PCP Please obtain BMP/CBC, 2 view CXR in 1week,  (see Discharge instructions)   PCP Please follow up on the following pending results: None   Home Health: None   Equipment/Devices: None  Consultations: VVS, pall care Discharge Condition: Fair   CODE STATUS: DNR   Diet Recommendation: Heart Healthy   Chief Complaint  Patient presents with  . Chest Pain  . Shortness of Breath     Brief history of present illness from the day of admission and additional interim summary    Melanie Lowe a 82 y.o.femalewith abdominal aortic aneurysm, diastolic CHF, COPD presents to the ER because of chest pain. Patient's chest pain happened last evening while at home playing with cats. Pain was retrosternal radiating to the back. EMS was called and patient was brought to the ER and by the time patient reached ER chest pain resolved. Presently chest pain-free. EKG shows sinus bradycardia troponins were negative. Patient is being admitted for further observation and management.  ED Course:Chest x-ray was unremarkable. Troponin was negative. EKG shows sinus bradycardia.  Hospital issues addressed     1. Atypical chest pain. Resolved, EKG nonacute except for mild sinus bradycardia, symptom free now, troponin negative 3 sets, echocardiogram pending. Have her follow with cardiology outpatient for likely outpatient stress test versus risk factor modulation.  2. Infrarenal AAA. Size is large and closed with 8 x 8 cm, vascular surgery requested to evaluate, she is pain-free, in  the past has been referred to a tertiary care center and was told surgery is a poor option, cannot use beta blocker due to bradycardia. She for now fully realizes that this can rupture and would like to wait to make any further decisions before she discusses this at home with his family members, she will follow with vascular surgeon outpatient along with PCP, we had palliative care also talk and educate the patient. Patient fully understands that if the AAA ruptures chances of which are high she would most likely die from this.  3. COPD. At baseline, continue inhalers and as needed oxygen.  4. Chronic diastolic CHF. EF last 65% currently compensated, continue Imdur hold diuretic for #5 below.  5. ARF and CK D stage IV. Baseline creatinine close to 1.3, called after hydration and holding of diuretics, PCP to monitor BMP in 5-7 days and adjust diuretic dose again if needed.     Discharge diagnosis     Principal Problem:   Chest pain Active Problems:   COPD (chronic obstructive pulmonary disease) (HCC)   CKD (chronic kidney disease) stage 3, GFR 30-59 ml/min   Diastolic dysfunction    Discharge instructions    Discharge Instructions    Diet - low sodium heart healthy    Complete by:  As  directed   Discharge instructions    Complete by:  As directed   Follow with Primary MD Melanie L, MD in 3-4 days , review your abdominal ultrasound findings and show the discharge summary in detail to the physician  Get CBC, CMP, 2 view Chest X ray checked  by Primary MD or SNF MD in 5-7 days ( we routinely change or add medications that can affect your baseline labs and fluid status, therefore we recommend that you get the mentioned basic workup next visit with your PCP, your PCP may decide not to get them or add new tests based on their clinical decision)   Activity: As tolerated with Full fall precautions use walker/cane & assistance as needed   Disposition Home     Diet:   Heart  Healthy   For Heart failure patients - Check your Weight same time everyday, if you gain over 2 pounds, or you develop in leg swelling, experience more shortness of breath or chest pain, call your Primary MD immediately. Follow Cardiac Low Salt Diet and 1.5 lit/day fluid restriction.   On your next visit with your primary care physician please Get Medicines reviewed and adjusted.   Please request your Prim.MD to go over all Hospital Tests and Procedure/Radiological results at the follow up, please get all Hospital records sent to your Prim MD by signing hospital release before you go home.   If you experience worsening of your admission symptoms, develop shortness of breath, life threatening emergency, suicidal or homicidal thoughts you must seek medical attention immediately by calling 911 or calling your MD immediately  if symptoms less severe.  You Must read complete instructions/literature along with all the possible adverse reactions/side effects for all the Medicines you take and that have been prescribed to you. Take any new Medicines after you have completely understood and accpet all the possible adverse reactions/side effects.   Do not drive, operate heavy machinery, perform activities at heights, swimming or participation in water activities or provide baby sitting services if your were admitted for syncope or siezures until you have seen by Primary MD or a Neurologist and advised to do so again.  Do not drive when taking Pain medications.    Do not take more than prescribed Pain, Sleep and Anxiety Medications  Special Instructions: If you have smoked or chewed Tobacco  in the last 2 yrs please stop smoking, stop any regular Alcohol  and or any Recreational drug use.  Wear Seat belts while driving.   Please note  You were cared for by a hospitalist during your hospital stay. If you have any questions about your discharge medications or the care you received while you were in  the hospital after you are discharged, you can call the unit and asked to speak with the hospitalist on call if the hospitalist that took care of you is not available. Once you are discharged, your primary care physician will handle any further medical issues. Please note that NO REFILLS for any discharge medications will be authorized once you are discharged, as it is imperative that you return to your primary care physician (or establish a relationship with a primary care physician if you do not have one) for your aftercare needs so that they can reassess your need for medications and monitor your lab values.   Increase activity slowly    Complete by:  As directed      Discharge Medications     Medication List    TAKE  these medications   acetaminophen 500 MG tablet Commonly known as:  TYLENOL Take 250-500 mg by mouth daily as needed for mild pain or moderate pain. Reported on 04/11/2016   albuterol (2.5 MG/3ML) 0.083% nebulizer solution Commonly known as:  PROVENTIL Take 2.5 mg by nebulization 4 (four) times daily.   aspirin EC 81 MG tablet Take 81 mg by mouth daily.   budesonide-formoterol 160-4.5 MCG/ACT inhaler Commonly known as:  SYMBICORT Inhale 2 puffs into the lungs 2 (two) times daily.   citalopram 20 MG tablet Commonly known as:  CELEXA Take 20 mg by mouth at bedtime.   diazepam 10 MG tablet Commonly known as:  VALIUM Take 10 mg by mouth 3 (three) times daily as needed for anxiety.   gabapentin 100 MG capsule Commonly known as:  NEURONTIN Take 100 mg by mouth 5 (five) times daily.   isosorbide mononitrate 30 MG 24 hr tablet Commonly known as:  IMDUR Take 1 tablet (30 mg total) by mouth daily.   meclizine 25 MG tablet Commonly known as:  ANTIVERT Take 1 tablet (25 mg total) by mouth 3 (three) times daily as needed for dizziness.   nitroGLYCERIN 0.4 MG SL tablet Commonly known as:  NITROSTAT Place 0.4 mg under the tongue every 5 (five) minutes as needed for  chest pain.   oxyCODONE-acetaminophen 10-325 MG tablet Commonly known as:  PERCOCET Take 1 tablet by mouth every 6 (six) hours as needed for pain.   tiotropium 18 MCG inhalation capsule Commonly known as:  SPIRIVA Place 18 mcg into inhaler and inhale every evening.   torsemide 20 MG tablet Commonly known as:  DEMADEX Take 1 tablet (20 mg total) by mouth 2 (two) times daily.       Follow-up Information    Melanie L, MD. Schedule an appointment as soon as possible for a visit in 3 day(s).   Specialty:  Pulmonary Disease Why:  Show ultrasound report and full discharge summary, take a copy of discharge summary before going home Contact information: [address] [place] [address] [phone]        Lemar Livings, MD. Schedule an appointment as soon as possible for a visit in 2 week(s).   Specialties:  Vascular Surgery, Cardiology Contact information: [address] [place] [address] [phone]        Prentice Docker, MD. Schedule an appointment as soon as possible for a visit in 1 week(s).   Specialty:  Cardiology Contact information: [address] [place] [address] [phone]           Major procedures and Radiology Reports - PLEASE review detailed and final reports thoroughly  -      TTE 06-07-2016  Left ventricle: The cavity size was normal. Wall thickness was increased in a pattern of moderate LVH. Systolic function was vigorous. The estimated ejection fraction was in the range of 65% to 70%. Wall motion was normal; there were no regional wall motion abnormalities. Doppler parameters are consistent with  abnormal left ventricular relaxation (grade 1 diastolic dysfunction). Indeterminate filling pressures. - Aortic valve: There was mild regurgitation. - Aorta: Moderate aortic root enlargement. Aortic root dimension: 48 mm (ED). - Left atrium: The atrium was mildly dilated. - Tricuspid valve: There was mild  regurgitation. - Pulmonary arteries: Systolic pressure was mildly increased. PA  peak pressure: 38 mm Hg (S).   Abdominal ultrasound. Infrarenal AAA close to 8 x 8 cm.  Dg Chest 2 View  Result  Date: 09/02/2016 CLINICAL DATA:  Chest pain and shortness of breath beginning at 4 p.m. today. EXAM: CHEST  2 VIEW COMPARISON:  PA and lateral chest 06/06/2016 and 09/05/2015. CT chest 07/01/2015. FINDINGS: Linear atelectasis or scar is seen in the right lung base and lingula. The lungs are emphysematous but clear. Cardiomegaly is identified. No pneumothorax or pleural effusion. Aortic atherosclerosis is noted. No focal bony abnormality. IMPRESSION: No acute disease. Emphysema. Cardiomegaly. Atherosclerosis. Electronically Signed   By: Drusilla Kanner M.D.   On: 09/02/2016 20:07   US Abdomen Complete  Result Date: 09/03/2016 CLINICAL DATA:  AAA, status post cholecystectomy EXAM: ABDOMEN ULTRASOUND COMPLETE COMPARISON:  CT abdomen/pelvis dated 12/12/2015 FINDINGS: Gallbladder: Surgically absent. Common bile duct: Diameter: 9 mm Liver: No focal lesion identified. Within normal limits in parenchymal echogenicity. IVC: No abnormality visualized. Pancreas: Visualized portion unremarkable. Spleen: Size and appearance within normal limits. Right Kidney: Length: 9.4 cm. Cortical thinning. No hydronephrosis. Left Kidney: Length: 8.4 cm.  Cortical thinning.  No hydronephrosis. Abdominal aorta: 8.1 x 7.7 cm infrarenal abdominal aortic aneurysm with eccentric mural thrombus, previously 7.5 x 7.4 cm on CT. Other findings: None. IMPRESSION: 8.1 x 7.7 cm infrarenal abdominal aortic aneurysm, previously 7.5 x 7.4 cm on CT. Vascular surgery consultation recommended due to increased risk of rupture for AAA >5.5 cm. This recommendation follows ACR consensus guidelines: White Paper of the ACR Incidental Findings Committee II on Vascular Findings. J Am Coll Radiol 2013; 10:789-794. Electronically Signed   By: Charline Bills M.D.    On: 09/03/2016 09:20    Micro Results    No results found for this or any previous visit (from the past 240 hour(s)).  Today   Subjective    Melanie Lowe today has no headache,no chest abdominal pain,no new weakness tingling or numbness, feels much better wants to go home today.     Objective   Blood pressure 120/71, pulse (!) 51, temperature 97.5 F (36.4 C), temperature source Oral, resp. rate 18, height 5\' 1"  (1.549 m), weight 57.2 kg (126 lb), SpO2 93 %.   Intake/Output Summary (Last 24 hours) at 09/04/16 1004 Last data filed at 09/04/16 0929  Gross per 24 hour  Intake             1585 ml  Output             2300 ml  Net             -715 ml    Exam Awake Alert, Oriented x 3, No new F.N deficits, Normal affect [place].AT,PERRAL Supple Neck,No JVD, No cervical lymphadenopathy appriciated.  Symmetrical Chest wall movement, Good air movement bilaterally, CTAB RRR,No Gallops,Rubs or new Murmurs, No Parasternal Heave +ve B.Sounds, Abd Soft, Non tender, No organomegaly appriciated, No rebound -guarding or rigidity. No Cyanosis, Clubbing or edema, No new Rash or bruise   Data Review   CBC w Diff: Lab Results  Component Value Date   WBC 4.7 09/02/2016   HGB 11.9 (Lowe) 09/02/2016   HCT 35.0 (Lowe) 09/02/2016   PLT 206 09/02/2016   LYMPHOPCT 29 06/06/2016   MONOPCT 8 06/06/2016   EOSPCT 4 06/06/2016   BASOPCT 0 06/06/2016    CMP: Lab Results  Component Value Date   NA 141 09/04/2016   K 4.0 09/04/2016   CL 108 09/04/2016   CO2 29 09/04/2016   BUN 28 (H) 09/04/2016   CREATININE 1.09 (H) 09/04/2016   PROT 5.9 (Lowe) 09/02/2016   ALBUMIN 3.1 (Lowe) 09/02/2016  BILITOT <0.1 (Lowe) 09/02/2016   ALKPHOS 64 09/02/2016   AST 17 09/02/2016   ALT 10 (Lowe) 09/02/2016  .   Total Time in preparing paper work, data evaluation and todays exam - 35 minutes  Leroy SeaSINGH,PRASHANT K M.D on 09/04/2016 at 10:04 AM  [facility]   Office  [phone]

## 2016-09-04 NOTE — Progress Notes (Signed)
   Noted patient decision for palliative care. Discussed natural history of 8cm aneurysm with high likelihood of rupture in near future. Patient demonstrates good understanding and states that she has "lived a good life." will not get further testing or plan for repair and repair will not be possible in the future in emergent setting. Will sign off.   Carlosdaniel Grob C. Randie Heinz, MD [facility] Office: [phone] Pager: [phone]

## 2016-09-04 NOTE — Consult Note (Addendum)
   [facility] CM Inpatient Consult   09/04/2016  Melanie Lowe 1924-06-22 [id number]  Patient is currently active with [facility] Care Management for chronic disease management services.  Patient has been engaged by a [employer] and CSW, in the past.  Currently, awaiting possible Palliative Care at home with new diagnosis.  Patient has active consent on file.  Went by to speak with patient and in the mist of talking with her the cardiologist came in to speak with her.  Our community based plan of care has focused on disease management and community resource support.  Patient will receive a post discharge transition of care call and will be evaluated for monthly home visits for assessments and disease process education.   Of note, [facility] Care Management services does not replace or interfere with any services that are needed or arranged by inpatient case management or social work.  For additional questions or referrals please contact:  Charlesetta Shanks, RN BSN CCM [facility]  [phone] business mobile phone Toll free office [phone]  Review of the notes reveals patient  Is a 82 y.o. female  with past medical history of  COPD (oxygen dependent), diastolic HF, AAA, and stroke who was admitted on 09/02/2016 with chest pain.  She has been seen by cardiology.  Her troponins have been essentially normal. Cardiology is recommending medical management.   She has also been evaluated by vascular surgery for her AAA.  They have estimated a 50% probability of AAA rupture in the next year.  Melanie Lowe understands this.  She is not a candidate for surgery, nor does she want surgery.  According to inpatient RNCM notes the patient declined hospice at home and was set up with a HHRN.

## 2016-09-04 NOTE — Evaluation (Signed)
Physical Therapy Evaluation Patient Details Name: Melanie Lowe MRN: [id number] DOB: April 29, 1924 Today's Date: 09/04/2016   History of Present Illness  82 y.o. female  with past medical history of  COPD (oxygen dependent), diastolic HF, AAA, and stroke who was admitted on 09/02/2016 with chest pain  Clinical Impression  Patient seen for mobility assessment for discharge. Patient mobilizing well, independent with activity, no further acute PT needs, will sign off.    Follow Up Recommendations No PT follow up    Equipment Recommendations  None recommended by PT    Recommendations for Other Services       Precautions / Restrictions Precautions Precaution Comments: home O2 dependent 3 liters      Mobility  Bed Mobility Overal bed mobility: Modified Independent                Transfers Overall transfer level: Independent                  Ambulation/Gait Ambulation/Gait assistance: Independent Ambulation Distance (Feet): 410 Feet Assistive device: None Gait Pattern/deviations: WFL(Within Functional Limits)   Gait velocity interpretation: at or above normal speed for age/gender General Gait Details: steady with mobility  Stairs            Wheelchair Mobility    Modified Rankin (Stroke Patients Only)       Balance Overall balance assessment: No apparent balance deficits (not formally assessed)                                           Pertinent Vitals/Pain Pain Assessment: No/denies pain    Home Living Family/patient expects to be discharged to:: Private residence Living Arrangements: Children Available Help at Discharge: Available 24 hours/day Type of Home: Apartment (at sons house) Home Access: Level entry     Home Layout: One level Home Equipment: Environmental consultant - 2 wheels;Shower seat      Prior Function Level of Independence: Independent         Comments: uses home O2     Hand Dominance        Extremity/Trunk  Assessment   Upper Extremity Assessment: Overall WFL for tasks assessed           Lower Extremity Assessment: Overall WFL for tasks assessed      Cervical / Trunk Assessment: Kyphotic  Communication   Communication: No difficulties  Cognition Arousal/Alertness: Awake/alert Behavior During Therapy: WFL for tasks assessed/performed Overall Cognitive Status: Within Functional Limits for tasks assessed                      General Comments General comments (skin integrity, edema, etc.): patient able to rearrange room, and push O2 tank through halls without difficulty    Exercises        Assessment/Plan    PT Assessment Patent does not need any further PT services  PT Diagnosis Difficulty walking   PT Problem List    PT Treatment Interventions     PT Goals (Current goals can be found in the Care Plan section) Acute Rehab PT Goals PT Goal Formulation: All assessment and education complete, DC therapy    Frequency     Barriers to discharge        Co-evaluation               End of Session Equipment Utilized During Treatment: Oxygen Activity  Tolerance: Patient tolerated treatment well Patient left: in chair;with call bell/phone within reach Nurse Communication: Mobility status         Time: 1150-1208 PT Time Calculation (min) (ACUTE ONLY): 18 min   Charges:   PT Evaluation $PT Eval Low Complexity: 1 Procedure     PT G CodesFabio Lowe:        Melanie Lowe J 09/04/2016, 12:16 PM  Melanie Lowe Melanie Lowe, PT DPT  [phone]

## 2016-09-04 NOTE — Patient Outreach (Signed)
[facility] Care Management  09/04/2016  LAILIE CHOPLIN 1924/12/21 [id number]   Patient admitted to the hospital.  Will notify hospital liaison.  Will send physician discipline closure letter.  Bary Leriche, RN, MSN [facility] Care Management RN Telephonic Health Coach [phone]

## 2016-09-07 ENCOUNTER — Other Ambulatory Visit: Payer: Self-pay | Admitting: *Deleted

## 2016-09-07 ENCOUNTER — Encounter: Payer: Self-pay | Admitting: *Deleted

## 2016-09-07 NOTE — Patient Outreach (Signed)
Transition of care call Patient had an observational stay. Patient reports she had a "light heart attack", she states that her aneurysm is larger and she knows that she is not a surgical candidate. Patient reports she is doing very well today, no need to take NTG today.  Patient reports her caregiver is out at an MD appointment and is to pick up medication on the way back from appointment. Caregiver manages medications and is not available to review this call. Patient has primary care appointment next week, also has an upcoming cardiology appointment.   Plan Will place in transition of care program Will attempt visit next week. Alben Spittle. [name], RN, BSN, CCM  [facility] [employer] [phone]

## 2016-09-11 DIAGNOSIS — R0902 Hypoxemia: Secondary | ICD-10-CM | POA: Diagnosis not present

## 2016-09-12 ENCOUNTER — Ambulatory Visit (INDEPENDENT_AMBULATORY_CARE_PROVIDER_SITE_OTHER): Payer: Commercial Managed Care - HMO | Admitting: Physician Assistant

## 2016-09-12 ENCOUNTER — Encounter: Payer: Self-pay | Admitting: Physician Assistant

## 2016-09-12 VITALS — BP 112/72 | HR 78 | Ht 61.0 in | Wt 122.0 lb

## 2016-09-12 DIAGNOSIS — I714 Abdominal aortic aneurysm, without rupture, unspecified: Secondary | ICD-10-CM

## 2016-09-12 DIAGNOSIS — R0789 Other chest pain: Secondary | ICD-10-CM | POA: Diagnosis not present

## 2016-09-12 DIAGNOSIS — J9611 Chronic respiratory failure with hypoxia: Secondary | ICD-10-CM

## 2016-09-12 DIAGNOSIS — I5032 Chronic diastolic (congestive) heart failure: Secondary | ICD-10-CM

## 2016-09-12 DIAGNOSIS — I10 Essential (primary) hypertension: Secondary | ICD-10-CM | POA: Diagnosis not present

## 2016-09-12 NOTE — Patient Instructions (Signed)
Your physician recommends that you schedule a follow-up appointment in: 2 Months with Dr. Branch   Your physician recommends that you continue on your current medications as directed. Please refer to the Current Medication list given to you today.  If you need a refill on your cardiac medications before your next appointment, please call your pharmacy.  Thank you for choosing [facility]!    

## 2016-09-12 NOTE — Progress Notes (Signed)
Cardiology Office Note    Date:  09/12/2016   ID:  Melanie Lowe, DOB 1924-11-19, MRN [id number]  PCP:  Fredirick MaudlinHAWKINS,EDWARD L, MD  Cardiologist: Previous Dr. Dietrich Patesothbart, to be established here  No chief complaint on file.   History of Present Illness:  Melanie Lowe is a 82 y.o. female  with past medical history of COPD (on 3L [place]), chronic diastolic CHF, CVA, and AAA (5.8 cm in 2013 and not felt to be a surgical candidate) who presented to [facility] on 09/02/2016 for evaluation of chest pain while playing with her dog partially relieved with sublingual nitroglycerin. Troponins were negative and EKG was without acute change.   Abdominal US showed a 8.1 x 7.7 cm infrarenal abdominal aortic aneurysm, previously 7.5 x 7.4 cm on CT in 12/2015. Vascular Surgery was consulted yesterday and she was thought to be high-risk for any procedure. She wanted to talk with her family about options, as the risk of rupture within the next year is close to 50%.   She is decided to go home and not pursue surgical options.  Recent echocardiogram in 05/2016 showed an EF of 65% to 70% with no regional wall motion abnormalities. Grade 1 DD noted with mild AR and mild TR. PA Peak pressure elevated to 38 mm Hg (S). She denies any recent episodes of worsening dyspnea with exertion or orthopnea. Reports consuming a low-sodium diet. Was on Torsemide 20mg  BID prior to admission but this was held in the setting of AKI.   Patient comes in today accompanied by her daughter. She hasn't done much since she left the hospital stays in bed most the time. She has occasional sharp shooting pains in her left chest on her left arm that are short lived. She does carry a heavy oxygen tank around with her on her shoulder. She denies any further chest tightness, pressure. She has chronic dyspnea and is on home O2. No weight gain or fluid buildup.        Past Medical History:  Diagnosis Date  . AAA (abdominal aortic aneurysm) (HCC)   .  Anxiety   . Arthritis   . CHF (congestive heart failure) (HCC) 11/09/2015  . COPD (chronic obstructive pulmonary disease) (HCC)   . Depression   . Hypertension   . Leg swelling   . Myocardial infarction (HCC)   . Neuropathy (HCC)   . Oxygen dependent   . Pneumonia   . Stroke [facility](HCC)     Past Surgical History:  Procedure Laterality Date  . APPENDECTOMY    . CHOLECYSTECTOMY      Current Medications: Outpatient Medications Prior to Visit  Medication Sig Dispense Refill  . acetaminophen (TYLENOL) 500 MG tablet Take 250-500 mg by mouth daily as needed for mild pain or moderate pain. Reported on 04/11/2016    . albuterol (PROVENTIL) (2.5 MG/3ML) 0.083% nebulizer solution Take 2.5 mg by nebulization 4 (four) times daily.     Marland Kitchen. aspirin EC 81 MG tablet Take 81 mg by mouth daily.    . budesonide-formoterol (SYMBICORT) 160-4.5 MCG/ACT inhaler Inhale 2 puffs into the lungs 2 (two) times daily.     . citalopram (CELEXA) 20 MG tablet Take 20 mg by mouth at bedtime.     . diazepam (VALIUM) 10 MG tablet Take 10 mg by mouth 3 (three) times daily as needed for anxiety.    . gabapentin (NEURONTIN) 100 MG capsule Take 100 mg by mouth 5 (five) times daily.     .Marland Kitchen  isosorbide mononitrate (IMDUR) 30 MG 24 hr tablet Take 1 tablet (30 mg total) by mouth daily. 30 tablet 12  . meclizine (ANTIVERT) 25 MG tablet Take 1 tablet (25 mg total) by mouth 3 (three) times daily as needed for dizziness. 21 tablet 0  . nitroGLYCERIN (NITROSTAT) 0.4 MG SL tablet Place 0.4 mg under the tongue every 5 (five) minutes as needed for chest pain.    Marland Kitchen oxyCODONE-acetaminophen (PERCOCET) 10-325 MG tablet Take 1 tablet by mouth every 6 (six) hours as needed for pain.   0  . tiotropium (SPIRIVA) 18 MCG inhalation capsule Place 18 mcg into inhaler and inhale every evening.     . torsemide (DEMADEX) 20 MG tablet Take 1 tablet (20 mg total) by mouth 2 (two) times daily. 60 tablet 12   No facility-administered medications prior to visit.       Allergies:   Ciprofloxacin hcl; Nsaids; Other; Sulfur; and Tomato   Social History   Social History  . Marital status: Widowed    Spouse name: N/A  . Number of children: N/A  . Years of education: N/A   Social History Main Topics  . Smoking status: Former Smoker    Years: 50.00    Types: Cigarettes    Quit date: 12/31/2010  . Smokeless tobacco: Former Neurosurgeon    Quit date: 12/31/2010  . Alcohol use No  . Drug use: No  . Sexual activity: No   Other Topics Concern  . None   Social History Narrative  . None     Family History:  The patient's   family history includes Cancer in her brother, father, and sister; Diabetes in her brother; Heart attack in her brother; Heart disease in her brother.   ROS:   Please see the history of present illness.    Review of Systems  Constitution: Negative.  HENT: Negative.   Eyes: Negative.   Cardiovascular: Positive for dyspnea on exertion.  Respiratory: Negative.   Hematologic/Lymphatic: Negative.   Musculoskeletal: Negative.  Negative for joint pain.  Gastrointestinal: Positive for diarrhea.  Genitourinary: Negative.   Neurological: Negative.    All other systems reviewed and are negative.   PHYSICAL EXAM:   VS:  BP 112/72   Pulse 78   Ht  (1.549 m)   Wt 122 lb (55.3 kg)   SpO2 91%   BMI 23.05 kg/m   Physical Exam  GEN: Well nourished, well developed, in no acute distress Neck: no JVD, carotid bruits, or masses Cardiac:RRR; no murmurs, rubs, or gallops  Respiratory:  Decreased breath sounds but clear to auscultation bilaterally, normal work of breathing GI: soft, nontender, nondistended, + BS Ext: without cyanosis, clubbing, or edema, Good distal pulses bilaterally MS: no deformity or atrophy Psych: euthymic mood, full affect  Wt Readings from Last 3 Encounters:  09/12/16 122 lb (55.3 kg)  09/04/16 126 lb (57.2 kg)  08/17/16 121 lb (54.9 kg)      Studies/Labs Reviewed:   EKG:  EKG is Not ordered today.      Recent Labs: 09/02/2016: ALT 10; B Natriuretic Peptide 63.6; Hemoglobin 11.9; Platelets 206 09/04/2016: BUN 28; Creatinine, Ser 1.09; Potassium 4.0; Sodium 141   Lipid Panel    Component Value Date/Time   CHOL 176 08/02/2011 0437   TRIG 167 (H) 08/02/2011 0437   HDL 46 08/02/2011 0437   CHOLHDL 3.8 08/02/2011 0437   VLDL 33 08/02/2011 0437   LDLCALC 97 08/02/2011 0437    Additional studies/ records that were  reviewed today include:   Echocardiogram: 05/2016 Study Conclusions   - Left ventricle: The cavity size was normal. Wall thickness was   increased in a pattern of moderate LVH. Systolic function was   vigorous. The estimated ejection fraction was in the range of 65%   to 70%. Wall motion was normal; there were no regional wall   motion abnormalities. Doppler parameters are consistent with   abnormal left ventricular relaxation (grade 1 diastolic   dysfunction). Indeterminate filling pressures. - Aortic valve: There was mild regurgitation. - Aorta: Moderate aortic root enlargement. Aortic root dimension:   48 mm (ED). - Left atrium: The atrium was mildly dilated. - Tricuspid valve: There was mild regurgitation. - Pulmonary arteries: Systolic pressure was mildly increased. PA   peak pressure: 38 mm Hg (S).    ASSESSMENT:    1. Other chest pain   2. Essential hypertension   3. Chronic diastolic congestive heart failure (HCC)   4. Abdominal aortic aneurysm (AAA) without rupture (HCC)   5. Chronic respiratory failure with hypoxia (HCC)      PLAN:  In order of problems listed above:  Patient had admission with chest pain negative troponins and EKGs. She still has occasional sharp shooting pain but no further chest tightness. She hasn't done much activity wise but says she like to start walking outside. To be established with cardiologist in 2-3 months here in [place].  Hypertension well controlled  Chronic diastolic heart failure compensated without evidence of  CHF  Abdominal aortic aneurysm has enlarged to 8.1 x 7.7. Not felt to be a good surgical candidate. Opted for palliative care.  Chronic respiratory failure with hypoxia on chronic O2.    Medication Adjustments/Labs and Tests Ordered: Current medicines are reviewed at length with the patient today.  Concerns regarding medicines are outlined above.  Medication changes, Labs and Tests ordered today are listed in the Patient Instructions below. Patient Instructions  Your physician recommends that you schedule a follow-up appointment in: 2 Months with Dr. Wyline Mood  Your physician recommends that you continue on your current medications as directed. Please refer to the Current Medication list given to you today.  If you need a refill on your cardiac medications before your next appointment, please call your pharmacy.  Thank you for choosing [facility]!       Signed, Jacolyn Reedy, PA-C  09/12/2016 2:26 PM    [facility] [address] [place], [place], [place]  [address] Phone: [phone]; Fax: [phone]

## 2016-09-14 ENCOUNTER — Encounter: Payer: Self-pay | Admitting: *Deleted

## 2016-09-14 ENCOUNTER — Other Ambulatory Visit: Payer: Self-pay | Admitting: *Deleted

## 2016-09-14 NOTE — Patient Outreach (Addendum)
[facility]) Care Management   09/14/2016  Melanie Lowe December 09, 1924 [id number]  Melanie Lowe is an 82 y.o. female  Subjective:  Patient states she is doing well. She states she does not want to pursue any Hospice care at this time, Saying "God knows my time on this earth"  Patient states she saw Cardiologist this week, no new medications.  Patient has appointment with primary care physician in October, office rescheduled due to MD going out of town.  Patient caregiver states they stopped home care from coming out as they did not feel patient needed the therapy, they do want to complete Advanced directives   Objective:   Review of Systems  Constitutional: Negative.   HENT: Negative.   Respiratory: Negative.   Cardiovascular: Negative.        NTG if needed for chest pain  Gastrointestinal: Negative.   Genitourinary: Negative.   Musculoskeletal: Negative.   Skin: Negative.    BP 108/62   Pulse 72   Resp 20   Wt 118 lb (53.5 kg)   SpO2 92%   BMI 22.30 kg/m   Physical Exam  Constitutional: She is oriented to person, place, and time. She appears well-developed and well-nourished.  Neck: Normal range of motion. Neck supple.  Cardiovascular: Normal rate.   Respiratory: Effort normal and breath sounds normal.  GI: Soft. Bowel sounds are normal.  Hernia on left side  Musculoskeletal: Normal range of motion.  Neurological: She is alert and oriented to person, place, and time.  Skin: Skin is warm and dry.    Encounter Medications:   Outpatient Encounter Prescriptions as of 09/14/2016  Medication Sig Note  . acetaminophen (TYLENOL) 500 MG tablet Take 250-500 mg by mouth daily as needed for mild pain or moderate pain. Reported on 04/11/2016   . albuterol (PROVENTIL) (2.5 MG/3ML) 0.083% nebulizer solution Take 2.5 mg by nebulization 4 (four) times daily.    Marland Kitchen aspirin EC 81 MG tablet Take 81 mg by mouth daily.   . budesonide-formoterol (SYMBICORT) 160-4.5 MCG/ACT  inhaler Inhale 2 puffs into the lungs 2 (two) times daily.  05/04/2016: Samples given at MD office 05/03/16  . citalopram (CELEXA) 20 MG tablet Take 20 mg by mouth at bedtime.    . diazepam (VALIUM) 10 MG tablet Take 10 mg by mouth 3 (three) times daily as needed for anxiety.   . gabapentin (NEURONTIN) 100 MG capsule Take 100 mg by mouth 5 (five) times daily.    . isosorbide mononitrate (IMDUR) 30 MG 24 hr tablet Take 1 tablet (30 mg total) by mouth daily.   . nitroGLYCERIN (NITROSTAT) 0.4 MG SL tablet Place 0.4 mg under the tongue every 5 (five) minutes as needed for chest pain.   Marland Kitchen oxyCODONE-acetaminophen (PERCOCET) 10-325 MG tablet Take 1 tablet by mouth every 6 (six) hours as needed for pain.    Marland Kitchen tiotropium (SPIRIVA) 18 MCG inhalation capsule Place 18 mcg into inhaler and inhale every evening.    . torsemide (DEMADEX) 20 MG tablet Take 1 tablet (20 mg total) by mouth 2 (two) times daily. 03/27/2016: Education on taking the second one earlier in day   . meclizine (ANTIVERT) 25 MG tablet Take 1 tablet (25 mg total) by mouth 3 (three) times daily as needed for dizziness. (Patient not taking: Reported on 09/14/2016)    No facility-administered encounter medications on file as of 09/14/2016.     Functional Status:   In your present state of health, do you have any  difficulty performing the following activities: 09/07/2016 09/02/2016  Hearing? Malvin JohnsY Y  Vision? N N  Difficulty concentrating or making decisions? N N  Walking or climbing stairs? Y Y  Dressing or bathing? Y Y  Doing errands, shopping? Y N  Preparing Food and eating ? Y -  Using the Toilet? N -  In the past six months, have you accidently leaked urine? Y -  Do you have problems with loss of bowel control? N -  Managing your Medications? Y -  Managing your Finances? Y -  Housekeeping or managing your Housekeeping? Y -  Some recent data might be hidden    Fall/Depression Screening:    PHQ 2/9 Scores 09/07/2016 07/20/2016 05/04/2016 12/16/2015   PHQ - 2 Score 0 0 0 2  PHQ- 9 Score - - - 4    Assessment:   AAA: 8.8 mm, non operatable, patient aware of nonsurgical candidate, patient "not worried about it". HF: Weight stable, patient weighing daily, aware of symptoms to report COPD: oxygen dependent, denies any symptoms this visit  Needs advanced Directives: refer to SW for assistance with completion. Discussed patient wishes, patient would want medications such as antibiotics and also non-surgical interventions, but does not want CPR or to "be on machines"   Plan:  RNCM will send barriers letter [facility] will refer to LCSW for Advanced Directives completion RNCM will transfer case to Marja KaysAlisa Gilboy for ongoing management. Patient agrees. Alben SpittleMary E. Albertha GheeNiemczura, RN, BSN, CCM  [facility] [employer] Care Manager [phone]

## 2016-09-18 ENCOUNTER — Ambulatory Visit: Payer: Self-pay

## 2016-09-19 NOTE — Patient Outreach (Signed)
[facility]) Care Management  09/19/2016  Melanie Lowe 21-Mar-1924 [id number]   Patient outreach letter for community resources has been reprinted and corrected with correct disclaimer information.   [name]  [facility] Care Management Assistant

## 2016-09-19 NOTE — Patient Outreach (Signed)
[facility]) Care Management  09/19/2016  Melanie Lowe 1924/01/29 [id number]   Request received from Lorna Few, LCSW to mail patient Advanced Directives packet. Packet mailed 09/19/16.   [name]  [facility] Care Management Assistant

## 2016-09-20 ENCOUNTER — Other Ambulatory Visit: Payer: Self-pay | Admitting: *Deleted

## 2016-09-20 NOTE — Patient Outreach (Signed)
Transition of care call Week #3  Spoke with patient caregiver, [name]. She reports patient is doing very well.  She has not heard from LCSW yet regarding Advanced Directives. Continues to state need to clarify wishes.  No new issues, patient has MD appointment with primary care next week, has already seen cardiology.   Assessment: COPD: oxygen dependent  HF: Patient weighing daily, follows low salt diet Advanced directives: referral to [facility] LCSW Plan: [name] CM Care Plan Problem One   Flowsheet Row Most Recent Value  Care Plan Problem One  HF recent admission  Role Documenting the Problem One  Care Management Coordinator  Care Plan for Problem One  Active  [facility] Long Term Goal (31-90 days)  Patient will be able to self manage at home without a readmission over the next 31 days  [facility] Long Term Goal Start Date  09/07/16  Interventions for Problem One Long Term Goal  verified caregiver aware of signs and symptoms to report, importance of keeping MD appts  [facility] CM Short Term Goal #1 (0-30 days)  Patient will be compliant with daily weights and record in [facility] calendar on weight log over the next 30 days  [facility] CM Short Term Goal #1 Start Date  09/07/16  Interventions for Short Term Goal #1  verified daily weights, weights stable  [facility] CM Short Term Goal #2 (0-30 days)  Patient will verbalize understanding of NTG use and calling 911  [facility] CM Short Term Goal #2 Start Date  09/07/16    [facility] CM Care Plan Problem Two   Flowsheet Row Most Recent Value  Care Plan Problem Two  Needs Advanced Directives as verbalized by patient and caregiver  Role Documenting the Problem Two  Care Management Coordinator  Care Plan for Problem Two  Active  [facility] CM Short Term Goal #1 (0-30 days)  Patient will have an Advanced Directive with HCPOA completed in the next 30 days  [facility] CM Short Term Goal #1 Start Date  09/14/16  Interventions for Short Term Goal #2   discussed referral to [facility] LCSW for advanced directive completion,  expect a call    Will transfer case to Marja Kays, RN for ongoing transition of care program services. This RNCM will sign off. Patient and family agree. Alben Spittle. [name], RN, BSN, CCM  [facility] [employer] [phone]

## 2016-09-21 ENCOUNTER — Encounter: Payer: Self-pay | Admitting: Licensed Clinical Social Worker

## 2016-09-21 ENCOUNTER — Other Ambulatory Visit: Payer: Self-pay | Admitting: Licensed Clinical Social Worker

## 2016-09-21 NOTE — Patient Outreach (Signed)
Assessment:  CSW received referral on Melanie Lowe. CSW completed chart review on client on 09/21/16. Client sees Dr. Juanetta Gosling as primary care physician. Client has family support. She has caregiver assistance from [place], fiance of client's son. Client also has support from her son, Melanie Lowe.   Client has prescribed medications and is taking medications as prescribed. CSW communicated with Dorice Lamas, case management assistant regarding client needs; CSW asked Ana to please mail Advanced Directives Packet to client for client review and family review.  Darien Ramus has now mailed Advanced Directives packet to client.  Verdie Drown RN had been providing [facility] nursing support for client.  Mary transferred nursing support for client to RN Marja Kays.  Marja Kays RN is now providing [facility] nursing support for client. CSW spoke via phone with client on 09/21/16.  CSW verified client identity. Client gave CSW verbal permission for CSW to speak with Melanie Lowe, client's son, or with Melanie Lowe, son's fiance about client needs.  Melanie Lowe, son of client was also present and Melanie Lowe spoke further with CSW about needs of client. Melanie Lowe said client is using [facility] for transport needs of client to transport client to and from client's scheduled medical appointments.  Client is eating well and sleeping well.  Client sees Dr. Juanetta Gosling, primary care doctor as scheduled.  Melanie Lowe said client had used walker or cane for ambulation help; but, he said client was not using a walker or cane as much recently.  Melanie Lowe and CSW spoke of client care plan. CSW and Melanie Lowe spoke of Advanced Directives information and completion process for Advanced Directives for client. CSW encouraged that client and Melanie Lowe speak further with CSW in next 30 days to discuss Advanced Directives and Advanced Directives completion for client. Melanie Lowe agreed to this care plan for client.  Melanie Lowe said that Melanie Lowe, his fiancee, helps client with  medication management and daily care needs of client. Melanie Lowe and CSW reviewed and updated [facility] assessments for client. CSW encouraged that client and Melanie Lowe review Advanced Directives packet being mailed to client.  CSW gave Melanie Lowe and client [facility] CSW number of [phone].  CSW encouraged that client or Melanie Lowe or Melanie Lowe please call CSW as needed to discuss social work needs of client. CSW thanked client and Melanie Lowe for phone call with CSW on 09/21/16.     Plan:  Client and Melanie Lowe, client's son, will communicate with CSW in next 30 days about Advanced Directives information for client  and about Advanced Directives completion for client.   CSW to call client or Melanie Lowe, son of client, in two weeks to further discuss current needs of client.   Kelton Pillar.Madisan Bice MSW, LCSW Licensed Clinical Social Worker [facility] Care Management [phone]

## 2016-09-25 ENCOUNTER — Emergency Department (HOSPITAL_COMMUNITY): Payer: Commercial Managed Care - HMO

## 2016-09-25 ENCOUNTER — Emergency Department (HOSPITAL_COMMUNITY)
Admission: EM | Admit: 2016-09-25 | Discharge: 2016-09-25 | Disposition: A | Discharge status: Home / Self Care | Payer: Commercial Managed Care - HMO | Attending: Emergency Medicine | Admitting: Emergency Medicine

## 2016-09-25 ENCOUNTER — Encounter (HOSPITAL_COMMUNITY): Payer: Self-pay | Admitting: Emergency Medicine

## 2016-09-25 DIAGNOSIS — I509 Heart failure, unspecified: Secondary | ICD-10-CM | POA: Insufficient documentation

## 2016-09-25 DIAGNOSIS — M79672 Pain in left foot: Secondary | ICD-10-CM | POA: Diagnosis present

## 2016-09-25 DIAGNOSIS — R42 Dizziness and giddiness: Secondary | ICD-10-CM | POA: Diagnosis not present

## 2016-09-25 DIAGNOSIS — Z7982 Long term (current) use of aspirin: Secondary | ICD-10-CM | POA: Diagnosis not present

## 2016-09-25 DIAGNOSIS — I129 Hypertensive chronic kidney disease with stage 1 through stage 4 chronic kidney disease, or unspecified chronic kidney disease: Secondary | ICD-10-CM | POA: Insufficient documentation

## 2016-09-25 DIAGNOSIS — J449 Chronic obstructive pulmonary disease, unspecified: Secondary | ICD-10-CM | POA: Diagnosis not present

## 2016-09-25 DIAGNOSIS — N183 Chronic kidney disease, stage 3 (moderate): Secondary | ICD-10-CM | POA: Insufficient documentation

## 2016-09-25 DIAGNOSIS — Z87891 Personal history of nicotine dependence: Secondary | ICD-10-CM | POA: Diagnosis not present

## 2016-09-25 DIAGNOSIS — Z79899 Other long term (current) drug therapy: Secondary | ICD-10-CM | POA: Insufficient documentation

## 2016-09-25 DIAGNOSIS — L03116 Cellulitis of left lower limb: Secondary | ICD-10-CM | POA: Diagnosis not present

## 2016-09-25 DIAGNOSIS — M7989 Other specified soft tissue disorders: Secondary | ICD-10-CM | POA: Diagnosis not present

## 2016-09-25 LAB — CBC WITH DIFFERENTIAL/PLATELET
Basophils Absolute: 0 10*3/uL (ref 0.0–0.1)
Basophils Relative: 0 %
EOS ABS: 0.3 10*3/uL (ref 0.0–0.7)
Eosinophils Relative: 7 %
HCT: 35.2 % — ABNORMAL LOW (ref 36.0–46.0)
Hemoglobin: 11.4 g/dL — ABNORMAL LOW (ref 12.0–15.0)
LYMPHS ABS: 1.3 10*3/uL (ref 0.7–4.0)
LYMPHS PCT: 30 %
MCH: 29.7 pg (ref 26.0–34.0)
MCHC: 32.4 g/dL (ref 30.0–36.0)
MCV: 91.7 fL (ref 78.0–100.0)
Monocytes Absolute: 0.5 10*3/uL (ref 0.1–1.0)
Monocytes Relative: 10 %
NEUTROS PCT: 53 %
Neutro Abs: 2.5 10*3/uL (ref 1.7–7.7)
PLATELETS: 204 10*3/uL (ref 150–400)
RBC: 3.84 MIL/uL — AB (ref 3.87–5.11)
RDW: 14.8 % (ref 11.5–15.5)
WBC: 4.6 10*3/uL (ref 4.0–10.5)

## 2016-09-25 LAB — BASIC METABOLIC PANEL
Anion gap: 1 — ABNORMAL LOW (ref 5–15)
BUN: 37 mg/dL — AB (ref 6–20)
CO2: 29 mmol/L (ref 22–32)
CREATININE: 1.26 mg/dL — AB (ref 0.44–1.00)
Calcium: 8.8 mg/dL — ABNORMAL LOW (ref 8.9–10.3)
Chloride: 107 mmol/L (ref 101–111)
GFR calc Af Amer: 42 mL/min — ABNORMAL LOW (ref >60)
GFR, EST NON AFRICAN AMERICAN: 36 mL/min — AB (ref >60)
GLUCOSE: 125 mg/dL — AB (ref 65–99)
POTASSIUM: 3.9 mmol/L (ref 3.5–5.1)
SODIUM: 137 mmol/L (ref 135–145)

## 2016-09-25 MED ORDER — CEPHALEXIN 500 MG PO CAPS: 500.0000 mg | ORAL_CAPSULE | Freq: Three times a day (TID) | ORAL | 0 refills | Status: DC

## 2016-09-25 NOTE — Discharge Instructions (Signed)
Take antibiotics as directed.  If you were given medicines take as directed.  If you are on coumadin or contraceptives realize their levels and effectiveness is altered by many different medicines.  If you have any reaction (rash, tongues swelling, other) to the medicines stop taking and see a physician.    If your blood pressure was elevated in the ER make sure you follow up for management with a primary doctor or return for chest pain, shortness of breath or stroke symptoms.  Please follow up as directed and return to the ER or see a physician for new or worsening symptoms.  Thank you. Vitals:   09/25/16 1119 09/25/16 1123  BP: 120/67   Pulse: (!) 59   Resp: 18   Temp: 98.5 F (36.9 C)   TempSrc: Oral   SpO2: 94%   Weight:  122 lb 3 oz (55.4 kg)  Height: 5\' 1"  (1.549 m)

## 2016-09-25 NOTE — ED Notes (Signed)
Patient transported to X-ray 

## 2016-09-25 NOTE — ED Triage Notes (Signed)
Pt reports left foot swelling since yesterday with redness.  Pt has gained 2 pounds since yesterday, is taking lasix daily, has CHF. Pt denies sob.

## 2016-09-25 NOTE — ED Provider Notes (Signed)
[facility]-EMERGENCY DEPT Provider Note   CSN: [id number] Arrival date & time: 09/25/16  1112   By signing my name below, I, Christel MormonMatthew Jamison, attest that this documentation has been prepared under the direction and in the presence of Blane OharaJoshua Nadezhda Pollitt, MD . Electronically Signed: Christel MormonMatthew Jamison, Scribe. 09/25/2016. 11:58 AM.   History   Chief Complaint Chief Complaint  Patient presents with  . Foot Swelling    The history is provided by the patient and a relative. No language interpreter was used.   HPI Comments:  Melanie Lowe is a 82 y.o. female with PMHx of cellulitis, CHF, AAA, and MI who presents to the Emergency Department complaining of complaining of worsening L foot pain, swelling and redness since yesterday. Per family member, swelling began yesterday and then today the swelling worsened and foot became red. Family also reports that pt has been dizzy today and that pt has gained 2 lbs over the past 2 days. Pt describes the pain in her L foot as "throbbing." Pt fractured her L foot 2 years ago. Pt denies abdominal pain.   Past Medical History:  Diagnosis Date  . AAA (abdominal aortic aneurysm) (HCC)   . Anxiety   . Arthritis   . CHF (congestive heart failure) (HCC) 11/09/2015  . COPD (chronic obstructive pulmonary disease) (HCC)   . Depression   . Hypertension   . Leg swelling   . Myocardial infarction (HCC)   . Neuropathy (HCC)   . Oxygen dependent   . Pneumonia   . Stroke [facility](HCC)     Patient Active Problem List   Diagnosis Date Noted  . CKD (chronic kidney disease) stage 3, GFR 30-59 ml/min 09/02/2016  . Diastolic dysfunction 09/02/2016  . Malnutrition of moderate degree 04/13/2016  . COPD exacerbation (HCC) 04/11/2016  . Anxiety 04/11/2016  . PNA (pneumonia) 04/11/2016  . Acute on chronic diastolic heart failure (HCC) 11/12/2015  . CHF (congestive heart failure) (HCC) 11/09/2015  . Acute on chronic respiratory failure (HCC) 11/05/2015  . Palliative care encounter   .  DNR (do not resuscitate)   . Diverticulitis 11/02/2015  . Acute diverticulitis 11/02/2015  . AKI (acute kidney injury) (HCC) 11/02/2015  . Anemia 11/02/2015  . RLL pneumonia 11/02/2015  . Pneumonia 04/12/2015  . Failure to thrive in adult 11/11/2014  . Acute encephalopathy 11/11/2014  . Generalized weakness 11/11/2014  . Hypokalemia 11/11/2014  . Bradycardia 11/11/2014  . Dehydration 01/26/2014  . Orthostatic dizziness 01/24/2014  . SOB (shortness of breath) 01/24/2014  . Chronic respiratory failure with hypoxia (HCC) 01/24/2014  . Cough with hemoptysis 01/24/2014  . COPD (chronic obstructive pulmonary disease) (HCC) 01/24/2014  . Acute renal failure (HCC) 01/24/2014  . Dizzy 01/24/2014  . Acute-on-chronic respiratory failure (HCC) 10/12/2012  . Chest pain 10/10/2012  . CAP (community acquired pneumonia) 10/10/2012  . Aortic aneurysm (HCC) 04/16/2012  . Chest pain 08/01/2011  . HYPERCALCEMIA 06/16/2008  . ALLERGIC RHINITIS 12/05/2007  . WEIGHT GAIN 11/07/2007  . PAIN IN JOINT, LOWER LEG 10/09/2007  . TOBACCO ABUSE 09/29/2007  . Abdominal aortic aneurysm (HCC) 09/29/2007  . Sciatica 09/24/2007  . BACK PAIN 09/24/2007  . Anxiety disorder 11/28/2006  . DEPRESSION 11/28/2006  . CATARACT NOS 11/28/2006  . Essential hypertension 11/28/2006  . MYOCARDIAL INFARCTION, HX OF 11/28/2006  . COPD 11/28/2006  . OVERACTIVE BLADDER 11/28/2006  . UTI'S, RECURRENT 11/28/2006  . LOW BACK PAIN 11/28/2006  . URINARY INCONTINENCE 11/28/2006    Past Surgical History:  Procedure Laterality Date  .  APPENDECTOMY    . CHOLECYSTECTOMY      OB History    Gravida Para Term Preterm AB Living   5 5 5          SAB TAB Ectopic Multiple Live Births                   Home Medications    Prior to Admission medications   Medication Sig Start Date End Date Taking? Authorizing Provider  acetaminophen (TYLENOL) 500 MG tablet Take 250-500 mg by mouth daily as needed for mild pain or moderate  pain. Reported on 04/11/2016    Historical Provider, MD  albuterol (PROVENTIL) (2.5 MG/3ML) 0.083% nebulizer solution Take 2.5 mg by nebulization 4 (four) times daily.     Historical Provider, MD  aspirin EC 81 MG tablet Take 81 mg by mouth daily.    Historical Provider, MD  budesonide-formoterol (SYMBICORT) 160-4.5 MCG/ACT inhaler Inhale 2 puffs into the lungs 2 (two) times daily.     Historical Provider, MD  citalopram (CELEXA) 20 MG tablet Take 20 mg by mouth at bedtime.     Historical Provider, MD  diazepam (VALIUM) 10 MG tablet Take 10 mg by mouth 3 (three) times daily as needed for anxiety.    Historical Provider, MD  gabapentin (NEURONTIN) 100 MG capsule Take 100 mg by mouth 5 (five) times daily.  09/06/15   Historical Provider, MD  isosorbide mononitrate (IMDUR) 30 MG 24 hr tablet Take 1 tablet (30 mg total) by mouth daily. 06/07/16   Kari Baars, MD  meclizine (ANTIVERT) 25 MG tablet Take 1 tablet (25 mg total) by mouth 3 (three) times daily as needed for dizziness. Patient not taking: Reported on 09/14/2016 05/14/16   Ivery Quale, PA-C  nitroGLYCERIN (NITROSTAT) 0.4 MG SL tablet Place 0.4 mg under the tongue every 5 (five) minutes as needed for chest pain. 08/17/16   Historical Provider, MD  oxyCODONE-acetaminophen (PERCOCET) 10-325 MG tablet Take 1 tablet by mouth every 6 (six) hours as needed for pain.  12/21/15   Historical Provider, MD  tiotropium (SPIRIVA) 18 MCG inhalation capsule Place 18 mcg into inhaler and inhale every evening.     Historical Provider, MD  torsemide (DEMADEX) 20 MG tablet Take 1 tablet (20 mg total) by mouth 2 (two) times daily. 11/12/15   Kari Baars, MD    Family History Family History  Problem Relation Age of Onset  . Diabetes Brother   . Cancer Brother     Throat cancer, Heart attack  . Heart disease Brother   . Heart attack Brother   . Cancer Father   . Cancer Sister     Social History Social History  Substance Use Topics  . Smoking status:  Former Smoker    Years: 50.00    Types: Cigarettes    Quit date: 12/31/2010  . Smokeless tobacco: Former Neurosurgeon    Quit date: 12/31/2010  . Alcohol use No     Allergies   Ciprofloxacin hcl; Nsaids; Other; Sulfur; and Tomato   Review of Systems Review of Systems  Gastrointestinal: Negative for abdominal pain.  Musculoskeletal: Positive for arthralgias and joint swelling.  Neurological: Positive for dizziness.     Physical Exam Updated Vital Signs BP 120/67 (BP Location: Right Arm)   Pulse (!) 59   Temp 98.5 F (36.9 C) (Oral)   Resp 18   Ht 5\' 1"  (1.549 m)   Wt 122 lb 3 oz (55.4 kg)   SpO2 94%   BMI 23.09  kg/m   Physical Exam  Constitutional: She appears well-developed. No distress.  HENT:  Head: Normocephalic and atraumatic.  Pulmonary/Chest: Effort normal.  Abdominal: Soft. She exhibits no distension. There is no tenderness.  Musculoskeletal: She exhibits edema and tenderness.  Distal dorsum of L foot erythematous and warm. No leg swelling.   Neurological: She is alert.  Skin: Skin is warm and dry. There is erythema.  Psychiatric: She has a normal mood and affect.  Nursing note and vitals reviewed.    ED Treatments / Results  DIAGNOSTIC STUDIES:  Oxygen Saturation is 100% on NCO2, normal by my interpretation.    COORDINATION OF CARE:  11:57 AM Discussed treatment plan with pt at bedside and pt agreed to plan.   Labs (all labs ordered are listed, but only abnormal results are displayed) Labs Reviewed  CBC WITH DIFFERENTIAL/PLATELET  BASIC METABOLIC PANEL    EKG  EKG Interpretation  Date/Time:  Tuesday September 25 2016 11:32:04 EDT Ventricular Rate:  48 PR Interval:    QRS Duration: 99 QT Interval:  480 QTC Calculation: 429 R Axis:   65 Text Interpretation:  Sinus bradycardia Prolonged PR interval Baseline wander in lead(s) V6 Confirmed by Caeson Filippi MD, Siddharth Babington ([id number]) on 09/25/2016 11:39:19 AM       Radiology No results  found.  Procedures Procedures (including critical care time)  Medications Ordered in ED Medications - No data to display   Initial Impression / Assessment and Plan / ED Course  I have reviewed the triage vital signs and the nursing notes.  Pertinent labs & imaging results that were available during my care of the patient were reviewed by me and considered in my medical decision making (see chart for details).  Clinical Course   Patient with multiple medical issues presents with focal left foot swelling and redness. Discussed likely cellulitis also discussed other differential diagnosis. Patient feels mild lightheaded. Plan for general blood work x-ray and likely outpatient follow.  Patient has evidence of cellulitis. X-ray no fracture, blood work at patient's baseline. Oral fluids and outpatient follow-up.  If you were given medicines take as directed.  If you are on coumadin or contraceptives realize their levels and effectiveness is altered by many different medicines.  If you have any reaction (rash, tongues swelling, other) to the medicines stop taking and see a physician.    If your blood pressure was elevated in the ER make sure you follow up for management with a primary doctor or return for chest pain, shortness of breath or stroke symptoms.  Please follow up as directed and return to the ER or see a physician for new or worsening symptoms.  Thank you. Vitals:   09/25/16 1130 09/25/16 1200 09/25/16 1230 09/25/16 1300  BP: 123/83 120/83 121/75 114/74  Pulse: (!) 56 (!) 47 (!) 30 62  Resp: (!) 28 15 12 14   Temp:      TempSrc:      SpO2: 95% 100% 93% 98%  Weight:      Height:         Final Clinical Impressions(s) / ED Diagnoses   Final diagnoses:  Cellulitis of left foot    New Prescriptions New Prescriptions   No medications on file     Blane Ohara, MD 09/25/16 1319

## 2016-09-25 NOTE — ED Notes (Signed)
Patient returned from X-ray 

## 2016-09-26 ENCOUNTER — Other Ambulatory Visit: Payer: Self-pay | Admitting: *Deleted

## 2016-09-26 NOTE — Patient Outreach (Signed)
[facility]) Care Management  09/26/2016  Melanie Lowe 21-Mar-1924 [id number]  I spoke with Melanie Lowe by phone today to introduce myself in transition from her previous case Production designer, theatre/television/film. Melanie Lowe reports that she is feeling well. She did report to the ED yesterday for swelling of her foot and was diagnosed with cellulitis then sent home with oral antibiotics. We discussed her medications and treatment plan and she agreed for me to visit her at home next week on Tuesday for a face to face visit for ongoing disease management, care coordination, and follow up of her cellulitis.   Plan: I will see Melanie Lowe at home on Tuesday for a routine home visit at 11am.    Marja Kays [facility] Care Management  [phone]

## 2016-10-02 ENCOUNTER — Other Ambulatory Visit: Payer: Self-pay | Admitting: *Deleted

## 2016-10-02 ENCOUNTER — Encounter: Payer: Self-pay | Admitting: *Deleted

## 2016-10-02 NOTE — Patient Outreach (Signed)
[facility]) Care Management   10/02/2016  Melanie Lowe 12/21/1924 [id number]  Melanie Lowe is an 82 y.o. female with history of lower extremity cellulitis,CHF, AAA, MI, and COPD. Melanie Lowe was discharged from the hospital on 09/02/16 after an admission for CHF. [facility] Care Management has been following Melanie Lowe in the community for transition of care assessments, disease management, and care coordination. In addition, our social work team is involved in Melanie Lowe's care.   I am seeing Melanie Lowe at home today.  Subjective: "I actually think I'm doing pretty good."  Objective:  BP 100/64   Pulse (!) 59   Wt 118 lb (53.5 kg)   SpO2 93%   BMI 22.30 kg/m   Review of Systems  Constitutional: Negative.   HENT: Negative.   Eyes: Negative.   Respiratory: Negative.   Cardiovascular: Negative for chest pain, palpitations and leg swelling.  Gastrointestinal: Negative.   Genitourinary: Negative.   Musculoskeletal: Negative.  Negative for falls.  Skin:       Cellulitis of LLE - improved on oral antibiotics  Neurological: Negative.   Psychiatric/Behavioral: Negative.     Physical Exam  Constitutional: She is oriented to person, place, and time. Vital signs are normal. She appears well-developed and well-nourished. She is active.  Non-toxic appearance. She does not appear ill.  Cardiovascular: Normal rate and regular rhythm.  Exam reveals no gallop and no friction rub.   No murmur heard. Respiratory: Effort normal and breath sounds normal. She has no wheezes. She has no rhonchi. She has no rales.  GI: Soft. Bowel sounds are normal.  Neurological: She is alert and oriented to person, place, and time.  Skin: Skin is warm, dry and intact. Rash noted.  Mild rash LLE - per patient, improved since started on oral antibiotics  Psychiatric: She has a normal mood and affect. Her speech is normal and behavior is normal. Judgment and thought content normal. Cognition and memory are  normal.    Encounter Medications:   Outpatient Encounter Prescriptions as of 10/02/2016  Medication Sig Note  . acetaminophen (TYLENOL) 500 MG tablet Take 250-500 mg by mouth daily as needed for mild pain or moderate pain. Reported on 04/11/2016   . albuterol (PROVENTIL) (2.5 MG/3ML) 0.083% nebulizer solution Take 2.5 mg by nebulization 4 (four) times daily.    Marland Kitchen aspirin EC 81 MG tablet Take 81 mg by mouth daily.   . budesonide-formoterol (SYMBICORT) 160-4.5 MCG/ACT inhaler Inhale 2 puffs into the lungs 2 (two) times daily.    . cephALEXin (KEFLEX) 500 MG capsule Take 1 capsule (500 mg total) by mouth 3 (three) times daily.   . citalopram (CELEXA) 20 MG tablet Take 20 mg by mouth at bedtime.    . diazepam (VALIUM) 10 MG tablet Take 10 mg by mouth 3 (three) times daily as needed for anxiety.   . gabapentin (NEURONTIN) 100 MG capsule Take 100 mg by mouth 5 (five) times daily.    . isosorbide mononitrate (IMDUR) 30 MG 24 hr tablet Take 1 tablet (30 mg total) by mouth daily.   . meclizine (ANTIVERT) 25 MG tablet Take 1 tablet (25 mg total) by mouth 3 (three) times daily as needed for dizziness. (Patient not taking: Reported on 09/14/2016)   . nitroGLYCERIN (NITROSTAT) 0.4 MG SL tablet Place 0.4 mg under the tongue every 5 (five) minutes as needed for chest pain.   Marland Kitchen tiotropium (SPIRIVA) 18 MCG inhalation capsule Place 18 mcg into inhaler and inhale  every evening.    . torsemide (DEMADEX) 20 MG tablet Take 1 tablet (20 mg total) by mouth 2 (two) times daily. 03/27/2016: Education on taking the second one earlier in day    Functional Status:   In your present state of health, do you have any difficulty performing the following activities: 09/21/2016 09/21/2016  Hearing? N N  Vision? N N  Difficulty concentrating or making decisions? N N  Walking or climbing stairs? Y Y  Dressing or bathing? N N  Doing errands, shopping? Melanie JohnsY Y  Preparing Food and eating ? Y Y  Using the Toilet? N N  In the past six  months, have you accidently leaked urine? N N  Do you have problems with loss of bowel control? N N  Managing your Medications? Y -  Managing your Finances? Y -  Housekeeping or managing your Housekeeping? Y -  Some recent data might be hidden    Fall/Depression Screening:    PHQ 2/9 Scores 09/21/2016 09/07/2016 07/20/2016 05/04/2016 12/16/2015  PHQ - 2 Score 2 0 0 0 2  PHQ- 9 Score 5 - - - 4    Assessment:  82 year old female living in [place] with her son and his wife.   Chronic Health Condition (CHF) - Melanie Lowe was discharged from the hospital less than 1 month ago after an admission for CHF. She has been to her primary care provider and cardiologist in follow up. She is taking all medications as prescribed. She is weighing daily and is strictly adhering to her prescribed low sodium/heart healthy diet. She has a few rales in the left lower lung field which cleared fairly well with deep breathing/coughing. She is not edematous. Weight today is 118 pounds. She is using O2 @ 2l/[place] continuously.  Acute Health Condition (Cellulitis lower extremity) - Melanie Lowe presented to the ED last week with leg swelling and discomfort and was diagnosed with cellulitis and treated with oral antibiotics (Keflex). Melanie Lowe feels her leg is greatly improved and she is to complete her last dose of antibiotic today.   Chronic Health Condition (poor nutrition) - Melanie Lowe is eating twice daily; she says she drinks coffee, water, and diet soda daily; she is currently taking Ensure Protein from a supply received when she discharged; I will get Ensure coupons to her later this week for future purchases. I also recommended that Melanie Lowe purchase [employer] Instant Breakfast mix as recommended by the dietician so she could use this, mixed with 2% milk, when she does not have Ensure on hand.  Plan:   Melanie Lowe will continue taking her medications as prescribed.  Melanie Lowe will weigh and record daily, calling her  cardiology provider for weight gain of 3# overnight or 5# in a week.  Melanie Lowe will call Dr. Juanetta GoslingHawkins for new or worsening skin symptoms.  Melanie Lowe will continue taking nutritional supplement (drink) once daily in addition to her meals.    Marja Kayslisa Gilboy MHA,BSN,RN,CCM [facility] Care Management  [phone]

## 2016-10-05 ENCOUNTER — Other Ambulatory Visit: Payer: Self-pay | Admitting: Licensed Clinical Social Worker

## 2016-10-05 NOTE — Patient Outreach (Signed)
Assessment:  CSW spoke via phone with client. CSW verified client identity. CSW received verbal permission from client on 10/05/16 for CSW to speak with client about current client needs. Client has prescribed medications and is taking medications as prescribed. Client sees Dr. Juanetta Gosling as primary care doctor. Client has family support. She receives in home assistance from [place], fiance of client's son.Client has received Advanced Directives Information packet and is looking over information in packet received. [facility] RN Melanie Lowe in providing nursing support for client. Client is using [facility] to transport client to and from client's scheduled medical appointments. Client and CSW spoke of client care plan.  CSW encouraged that client and her son communicate with CSW in next 30 days to discuss advanced directives and completion process of advanced directives documents. Client is eating well.  Client is using oxygen as prescribed in the home to assist her with breathing of client.Client is having some difficulty sleeping and having some difficulty in going into crowded areas.  Client plans to speak with Dr. Juanetta Gosling at next visit with Dr. Juanetta Gosling about these issues.  Client likes to sit on porch to relax.  Client likes to watch TV to relax.  Client receives Meals on Wheels lunch meals 5 days weekly.  Client does her own activities of daily living. CSW thanked client for phone call with CSW.  CSW encouraged client, Melanie Lowe or Melanie Lowe to call CSW at [phone] as needed to discuss social work needs of client.    Plan:  Client and her son to communicate with CSW in next 30 days to discuss advanced directives and completion process of advanced directives documents.  CSW to call client or son of client in 4 weeks to assess client needs.  Melanie Lowe.Melanie Lowe MSW, LCSW Licensed Clinical Social Worker [facility] Care Management [phone]

## 2016-10-10 ENCOUNTER — Emergency Department (HOSPITAL_COMMUNITY): Payer: Commercial Managed Care - HMO

## 2016-10-10 ENCOUNTER — Encounter (HOSPITAL_COMMUNITY): Payer: Self-pay

## 2016-10-10 ENCOUNTER — Emergency Department (HOSPITAL_COMMUNITY)
Admission: EM | Admit: 2016-10-10 | Discharge: 2016-10-10 | Disposition: A | Discharge status: Home / Self Care | Payer: Commercial Managed Care - HMO | Attending: Emergency Medicine | Admitting: Emergency Medicine

## 2016-10-10 DIAGNOSIS — R0682 Tachypnea, not elsewhere classified: Secondary | ICD-10-CM | POA: Diagnosis not present

## 2016-10-10 DIAGNOSIS — N183 Chronic kidney disease, stage 3 (moderate): Secondary | ICD-10-CM | POA: Insufficient documentation

## 2016-10-10 DIAGNOSIS — Z87891 Personal history of nicotine dependence: Secondary | ICD-10-CM | POA: Insufficient documentation

## 2016-10-10 DIAGNOSIS — I5033 Acute on chronic diastolic (congestive) heart failure: Secondary | ICD-10-CM | POA: Diagnosis not present

## 2016-10-10 DIAGNOSIS — I130 Hypertensive heart and chronic kidney disease with heart failure and stage 1 through stage 4 chronic kidney disease, or unspecified chronic kidney disease: Secondary | ICD-10-CM | POA: Insufficient documentation

## 2016-10-10 DIAGNOSIS — J441 Chronic obstructive pulmonary disease with (acute) exacerbation: Secondary | ICD-10-CM | POA: Insufficient documentation

## 2016-10-10 DIAGNOSIS — Z7982 Long term (current) use of aspirin: Secondary | ICD-10-CM | POA: Insufficient documentation

## 2016-10-10 DIAGNOSIS — Z8673 Personal history of transient ischemic attack (TIA), and cerebral infarction without residual deficits: Secondary | ICD-10-CM | POA: Insufficient documentation

## 2016-10-10 DIAGNOSIS — Z79899 Other long term (current) drug therapy: Secondary | ICD-10-CM | POA: Diagnosis not present

## 2016-10-10 DIAGNOSIS — R0602 Shortness of breath: Secondary | ICD-10-CM | POA: Diagnosis not present

## 2016-10-10 LAB — CBC WITH DIFFERENTIAL/PLATELET
Basophils Absolute: 0 10*3/uL (ref 0.0–0.1)
Basophils Relative: 0 %
Eosinophils Absolute: 0.2 10*3/uL (ref 0.0–0.7)
Eosinophils Relative: 3 %
HEMATOCRIT: 35 % — AB (ref 36.0–46.0)
Hemoglobin: 11.3 g/dL — ABNORMAL LOW (ref 12.0–15.0)
LYMPHS ABS: 2.4 10*3/uL (ref 0.7–4.0)
LYMPHS PCT: 46 %
MCH: 29.7 pg (ref 26.0–34.0)
MCHC: 32.3 g/dL (ref 30.0–36.0)
MCV: 92.1 fL (ref 78.0–100.0)
MONO ABS: 0.5 10*3/uL (ref 0.1–1.0)
MONOS PCT: 9 %
NEUTROS ABS: 2.2 10*3/uL (ref 1.7–7.7)
Neutrophils Relative %: 42 %
Platelets: 194 10*3/uL (ref 150–400)
RBC: 3.8 MIL/uL — ABNORMAL LOW (ref 3.87–5.11)
RDW: 14.7 % (ref 11.5–15.5)
WBC: 5.2 10*3/uL (ref 4.0–10.5)

## 2016-10-10 LAB — BASIC METABOLIC PANEL
Anion gap: 6 (ref 5–15)
BUN: 30 mg/dL — ABNORMAL HIGH (ref 6–20)
CALCIUM: 9.2 mg/dL (ref 8.9–10.3)
CO2: 29 mmol/L (ref 22–32)
CREATININE: 1.44 mg/dL — AB (ref 0.44–1.00)
Chloride: 104 mmol/L (ref 101–111)
GFR calc Af Amer: 36 mL/min — ABNORMAL LOW (ref >60)
GFR calc non Af Amer: 31 mL/min — ABNORMAL LOW (ref >60)
GLUCOSE: 107 mg/dL — AB (ref 65–99)
Potassium: 3.3 mmol/L — ABNORMAL LOW (ref 3.5–5.1)
Sodium: 139 mmol/L (ref 135–145)

## 2016-10-10 MED ORDER — DIAZEPAM 5 MG PO TABS
5.0000 mg | ORAL_TABLET | Freq: Once | ORAL | Status: AC
  Administered 2016-10-10: 5 mg via ORAL
  Filled 2016-10-10: qty 1

## 2016-10-10 MED ORDER — PREDNISONE 20 MG PO TABS: ORAL_TABLET | ORAL | 0 refills | Status: DC

## 2016-10-10 MED ORDER — ALBUTEROL SULFATE (2.5 MG/3ML) 0.083% IN NEBU
5.0000 mg | INHALATION_SOLUTION | Freq: Once | RESPIRATORY_TRACT | Status: AC
  Administered 2016-10-10: 5 mg via RESPIRATORY_TRACT
  Filled 2016-10-10: qty 6

## 2016-10-10 MED ORDER — PREDNISONE 50 MG PO TABS: 60.0000 mg | ORAL_TABLET | Freq: Once | ORAL | Status: DC

## 2016-10-10 NOTE — Discharge Instructions (Signed)
Follow-up with your doctor next week for recheck use your nebulizer as prescribed

## 2016-10-10 NOTE — ED Provider Notes (Signed)
[facility]Y DEPT Provider Note   CSN: [id number] Arrival date & time: 10/10/16  1555     History   Chief Complaint Chief Complaint  Patient presents with  . Shortness of Breath    HPI Melanie Lowe is a 82 y.o. female.  Patient complains of shortness of breath. Mild cough nonproductive   The history is provided by the patient. No language interpreter was used.  Shortness of Breath  This is a recurrent problem. The problem occurs intermittently.The current episode started less than 1 hour ago. The problem has not changed since onset.Associated symptoms include cough and wheezing. Pertinent negatives include no headaches, no ear pain, no chest pain, no abdominal pain and no rash. Precipitated by: Unknown.    Past Medical History:  Diagnosis Date  . AAA (abdominal aortic aneurysm) (HCC)   . Anxiety   . Arthritis   . CHF (congestive heart failure) (HCC) 11/09/2015  . COPD (chronic obstructive pulmonary disease) (HCC)   . Depression   . Hypertension   . Leg swelling   . Myocardial infarction   . Neuropathy (HCC)   . Oxygen dependent   . Pneumonia   . Stroke [facility])     Patient Active Problem List   Diagnosis Date Noted  . CKD (chronic kidney disease) stage 3, GFR 30-59 ml/min 09/02/2016  . Diastolic dysfunction 09/02/2016  . Malnutrition of moderate degree 04/13/2016  . COPD exacerbation (HCC) 04/11/2016  . Anxiety 04/11/2016  . PNA (pneumonia) 04/11/2016  . Acute on chronic diastolic heart failure (HCC) 11/12/2015  . CHF (congestive heart failure) (HCC) 11/09/2015  . Acute on chronic respiratory failure (HCC) 11/05/2015  . Palliative care encounter   . DNR (do not resuscitate)   . Diverticulitis 11/02/2015  . Acute diverticulitis 11/02/2015  . AKI (acute kidney injury) (HCC) 11/02/2015  . Anemia 11/02/2015  . RLL pneumonia (HCC) 11/02/2015  . Pneumonia 04/12/2015  . Failure to thrive in adult 11/11/2014  . Acute encephalopathy 11/11/2014  . Generalized  weakness 11/11/2014  . Hypokalemia 11/11/2014  . Bradycardia 11/11/2014  . Dehydration 01/26/2014  . Orthostatic dizziness 01/24/2014  . SOB (shortness of breath) 01/24/2014  . Chronic respiratory failure with hypoxia (HCC) 01/24/2014  . Cough with hemoptysis 01/24/2014  . COPD (chronic obstructive pulmonary disease) (HCC) 01/24/2014  . Acute renal failure (HCC) 01/24/2014  . Dizzy 01/24/2014  . Acute-on-chronic respiratory failure (HCC) 10/12/2012  . Chest pain 10/10/2012  . CAP (community acquired pneumonia) 10/10/2012  . Aortic aneurysm (HCC) 04/16/2012  . Chest pain 08/01/2011  . HYPERCALCEMIA 06/16/2008  . ALLERGIC RHINITIS 12/05/2007  . WEIGHT GAIN 11/07/2007  . PAIN IN JOINT, LOWER LEG 10/09/2007  . TOBACCO ABUSE 09/29/2007  . Abdominal aortic aneurysm (HCC) 09/29/2007  . Sciatica 09/24/2007  . BACK PAIN 09/24/2007  . Anxiety disorder 11/28/2006  . DEPRESSION 11/28/2006  . CATARACT NOS 11/28/2006  . Essential hypertension 11/28/2006  . MYOCARDIAL INFARCTION, HX OF 11/28/2006  . COPD 11/28/2006  . OVERACTIVE BLADDER 11/28/2006  . UTI'S, RECURRENT 11/28/2006  . LOW BACK PAIN 11/28/2006  . URINARY INCONTINENCE 11/28/2006    Past Surgical History:  Procedure Laterality Date  . APPENDECTOMY    . CHOLECYSTECTOMY      OB History    Gravida Para Term Preterm AB Living   5 5 5          SAB TAB Ectopic Multiple Live Births                   Home  Medications    Prior to Admission medications   Medication Sig Start Date End Date Taking? Authorizing Provider  acetaminophen (TYLENOL) 500 MG tablet Take 250-500 mg by mouth daily as needed for mild pain or moderate pain. Reported on 04/11/2016    Historical Provider, MD  albuterol (PROVENTIL) (2.5 MG/3ML) 0.083% nebulizer solution Take 2.5 mg by nebulization 4 (four) times daily.     Historical Provider, MD  aspirin EC 81 MG tablet Take 81 mg by mouth daily.    Historical Provider, MD  budesonide-formoterol (SYMBICORT)  160-4.5 MCG/ACT inhaler Inhale 2 puffs into the lungs 2 (two) times daily.     Historical Provider, MD  cephALEXin (KEFLEX) 500 MG capsule Take 1 capsule (500 mg total) by mouth 3 (three) times daily. 09/25/16   Blane Ohara, MD  citalopram (CELEXA) 20 MG tablet Take 20 mg by mouth at bedtime.     Historical Provider, MD  diazepam (VALIUM) 10 MG tablet Take 10 mg by mouth 3 (three) times daily as needed for anxiety.    Historical Provider, MD  gabapentin (NEURONTIN) 100 MG capsule Take 100 mg by mouth 5 (five) times daily.  09/06/15   Historical Provider, MD  isosorbide mononitrate (IMDUR) 30 MG 24 hr tablet Take 1 tablet (30 mg total) by mouth daily. 06/07/16   Kari Baars, MD  meclizine (ANTIVERT) 25 MG tablet Take 1 tablet (25 mg total) by mouth 3 (three) times daily as needed for dizziness. Patient not taking: Reported on 10/02/2016 05/14/16   Ivery Quale, PA-C  nitroGLYCERIN (NITROSTAT) 0.4 MG SL tablet Place 0.4 mg under the tongue every 5 (five) minutes as needed for chest pain. 08/17/16   Historical Provider, MD  oxyCODONE-acetaminophen (PERCOCET) 10-325 MG tablet Take 1 tablet by mouth every 6 (six) hours as needed for pain.    Kari Baars, MD  predniSONE (DELTASONE) 20 MG tablet 2 tabs po daily x 3 days 10/10/16   [name], MD  tiotropium (SPIRIVA) 18 MCG inhalation capsule Place 18 mcg into inhaler and inhale every evening.     Historical Provider, MD  torsemide (DEMADEX) 20 MG tablet Take 1 tablet (20 mg total) by mouth 2 (two) times daily. 11/12/15   Kari Baars, MD    Family History Family History  Problem Relation Age of Onset  . Diabetes Brother   . Cancer Brother     Throat cancer, Heart attack  . Heart disease Brother   . Heart attack Brother   . Cancer Father   . Cancer Sister     Social History Social History  Substance Use Topics  . Smoking status: Former Smoker    Years: 50.00    Types: Cigarettes    Quit date: 12/31/2010  . Smokeless tobacco: Former  Neurosurgeon    Quit date: 12/31/2010  . Alcohol use No     Allergies   Ciprofloxacin hcl; Nsaids; Other; Sulfur; and Tomato   Review of Systems Review of Systems  Constitutional: Negative for appetite change and fatigue.  HENT: Negative for congestion, ear discharge, ear pain and sinus pressure.   Eyes: Negative for discharge.  Respiratory: Positive for cough, shortness of breath and wheezing.   Cardiovascular: Negative for chest pain.  Gastrointestinal: Negative for abdominal pain and diarrhea.  Genitourinary: Negative for frequency and hematuria.  Musculoskeletal: Negative for back pain.  Skin: Negative for rash.  Neurological: Negative for seizures and headaches.  Psychiatric/Behavioral: Negative for hallucinations.     Physical Exam Updated Vital Signs BP 99/67  Pulse 97   Temp 97.5 F (36.4 C) (Oral)   Resp 22   Ht 5\' 1"  (1.549 m)   Wt 122 lb (55.3 kg)   SpO2 90%   BMI 23.05 kg/m   Physical Exam  Constitutional: She is oriented to person, place, and time. She appears well-developed.  HENT:  Head: Normocephalic.  Eyes: Conjunctivae and EOM are normal. No scleral icterus.  Neck: Neck supple. No thyromegaly present.  Cardiovascular: Normal rate and regular rhythm.  Exam reveals no gallop and no friction rub.   No murmur heard. Pulmonary/Chest: No stridor. She has wheezes. She has no rales. She exhibits no tenderness.  Abdominal: She exhibits no distension. There is no tenderness. There is no rebound.  Musculoskeletal: Normal range of motion. She exhibits no edema.  Lymphadenopathy:    She has no cervical adenopathy.  Neurological: She is oriented to person, place, and time. She exhibits normal muscle tone. Coordination normal.  Skin: No rash noted. No erythema.  Psychiatric: She has a normal mood and affect. Her behavior is normal.     ED Treatments / Results  Labs (all labs ordered are listed, but only abnormal results are displayed) Labs Reviewed  CBC WITH  DIFFERENTIAL/PLATELET - Abnormal; Notable for the following:       Result Value   RBC 3.80 (*)    Hemoglobin 11.3 (*)    HCT 35.0 (*)    All other components within normal limits  BASIC METABOLIC PANEL - Abnormal; Notable for the following:    Potassium 3.3 (*)    Glucose, Bld 107 (*)    BUN 30 (*)    Creatinine, Ser 1.44 (*)    GFR calc non Af Amer 31 (*)    GFR calc Af Amer 36 (*)    All other components within normal limits    EKG  EKG Interpretation None       Radiology Dg Chest 2 View  Result Date: 10/10/2016 CLINICAL DATA:  Shortness of Breath EXAM: CHEST  2 VIEW COMPARISON:  09/02/2016 FINDINGS: Cardiac shadow is enlarged but stable. Aortic calcifications and tortuosity are again noted. Chronic changes are seen in the bases bilaterally. Hyperinflation is again noted. No focal infiltrate or sizable effusion is seen. No acute bony abnormality is seen. IMPRESSION: COPD with bibasilar scarring. Electronically Signed   By: Alcide CleverMark  Lukens M.D.   On: 10/10/2016 17:32    Procedures Procedures (including critical care time)  Medications Ordered in ED Medications  albuterol (PROVENTIL) (2.5 MG/3ML) 0.083% nebulizer solution 5 mg (5 mg Nebulization Given 10/10/16 1636)  diazepam (VALIUM) tablet 5 mg (5 mg Oral Given 10/10/16 1937)     Initial Impression / Assessment and Plan / ED Course  I have reviewed the triage vital signs and the nursing notes.  Pertinent labs & imaging results that were available during my care of the patient were reviewed by me and considered in my medical decision making (see chart for details).  Clinical Course    Patient's wheezing improved with neb treatment. Diagnosis mild exacerbation of COPD. She is put on prednisone for 3 days will follow-up with PCP  Final Clinical Impressions(s) / ED Diagnoses   Final diagnoses:  COPD exacerbation (HCC)    New Prescriptions New Prescriptions   PREDNISONE (DELTASONE) 20 MG TABLET    2 tabs po daily x  3 days     Bethann BerkshireJoseph Jovannie Ulibarri, MD 10/10/16 2015

## 2016-10-10 NOTE — ED Notes (Signed)
PT TAKEN TO XRAY

## 2016-10-10 NOTE — ED Notes (Signed)
Pt c/o shaking, feeling bad all over, family reports that pt usually gets 5 mg valilum at home when she becomes this way, Dr Estell Harpin notified, additional orders given, pt pulse ox 90% on 2lpm via [place], pt and family report that pt is on 3 liters oxygen at home, oxygen increased to 3 lpm via [place],

## 2016-10-10 NOTE — ED Notes (Signed)
Receiving tx now

## 2016-10-10 NOTE — ED Triage Notes (Signed)
Complain of being SOB all day. Per EMS, pt's sats were in the 80's on arrival to  pt's house. Pt was give Solumedrol 125 mg and two Albuterol breathing treatments prior to arrival

## 2016-10-11 ENCOUNTER — Other Ambulatory Visit: Payer: Self-pay | Admitting: *Deleted

## 2016-10-11 DIAGNOSIS — R0902 Hypoxemia: Secondary | ICD-10-CM | POA: Diagnosis not present

## 2016-10-11 NOTE — Patient Outreach (Signed)
[facility]) Care Management  10/11/2016  KEYSA TROOP [birth year] [id number]   Noted Mrs. Haros's ED visit yesterday when she became short of breath. I reached out to Mrs. Hovorka and was able to speak with her primary caregiver, [name], who reported that Mrs. Harpold is feeling better today and having no further breathing difficulties.   Plan: Mrs. Fetsch will call for new or worsened symptoms and will report to the ED if she becomes short of breath and symptoms are not relieved with enactment of COPD action plan. I will review COPD action plan again with Mrs. Castrogiovanni on my next scheduled home visit.    Marja Kays MHA,BSN,RN,CCM [facility] Care Management  [phone]

## 2016-10-15 ENCOUNTER — Other Ambulatory Visit: Payer: Self-pay | Admitting: *Deleted

## 2016-10-15 NOTE — Patient Outreach (Signed)
[facility]) Care Management  10/15/2016  Melanie Lowe 10-19-1924 [id number]  Melanie Lowe is an 82 y.o. female with history of lower extremity cellulitis,CHF, AAA, MI, and COPD. Melanie Lowe was discharged from the hospital on 09/02/16 after an admission for CHF. [facility] Care Management has been following Melanie Lowe in the community for transition of care assessments, disease management, and care coordination. In addition, our social work team is involved in Melanie Lowe's care.   On Friday, I noted that Melanie Lowe's had visited the  ED on Thursday when she became short of breath. I reached out to Melanie Lowe and was able to speak with her primary caregiver, [name], who reported that Melanie Lowe was feeling better and had no further breathing difficulties.   I called Melanie Lowe today and she was sleeping. I spoke with her son who is one of her primary caregivers. He stated that Melanie Lowe was sleeping and she'd had a good weekend with no further respiratory symptoms.   Plan: Melanie Lowe will call for new or worsened symptoms and will report to the ED if she becomes short of breath and symptoms are not relieved with enactment of COPD action plan. I will review COPD action plan again with Melanie Lowe on my next scheduled home visit.    Marja Kays MHA,BSN,RN,CCM [facility] Care Management  [phone]

## 2016-10-25 ENCOUNTER — Ambulatory Visit: Payer: Commercial Managed Care - HMO | Admitting: Cardiology

## 2016-11-02 ENCOUNTER — Other Ambulatory Visit: Payer: Self-pay | Admitting: Licensed Clinical Social Worker

## 2016-11-02 NOTE — Patient Outreach (Signed)
Assessment:  CSW spoke via phone with client. CSW verified client identity. CSW received verbal permission from client on 11/02/16 for CSW to speak with client about current client needs. Client has prescribed medications and is taking medications as prescribed. Client has support in the home from her son and from [place], the fiance of her son.  Client sees Dr. Juanetta Gosling as primary care doctor. Client has received Advanced Directives information packet and has been reviewing information in packet in recent weeks. Client receives [facility] nursing support with RN Marja Kays.   Client said she uses [facility] to transport her to and from her scheduled medical appointments. CSW spoke with client about client care plan.  CSW encouraged client and her son to communicate with CSW in next 30 days to discuss Advanced Directives and completion process for Advanced Directives documents. Client is eating well. Client uses oxygen as prescribed in the home to assist her with breathing. Client receives Meals on Wheels lunch meal delivery 5 days weekly to her home. She enjoys sitting on porch to relax, watching TV to relax, or talking with family and friends to help her relax. CSW encouraged for client, Melanie Lowe or Melanie Lowe to call CSW at [phone] as needed to discuss social work needs of client.     Plan:  Client and son to communicate with CSW in next 30 days to discuss Advanced Directives and completion process for Advanced Directives documents.  CSW to collaborate with RN Marja Kays in monitoring client needs.  CSW to call client in 4 weeks to assess client needs.  Melanie Lowe.Melanie Lowe MSW, LCSW Licensed Clinical Social Worker [facility] Care Management [phone]

## 2016-11-06 ENCOUNTER — Other Ambulatory Visit: Payer: Self-pay | Admitting: *Deleted

## 2016-11-06 DIAGNOSIS — J449 Chronic obstructive pulmonary disease, unspecified: Secondary | ICD-10-CM | POA: Diagnosis not present

## 2016-11-06 DIAGNOSIS — I25119 Atherosclerotic heart disease of native coronary artery with unspecified angina pectoris: Secondary | ICD-10-CM | POA: Diagnosis not present

## 2016-11-06 DIAGNOSIS — I10 Essential (primary) hypertension: Secondary | ICD-10-CM | POA: Diagnosis not present

## 2016-11-06 DIAGNOSIS — Z23 Encounter for immunization: Secondary | ICD-10-CM | POA: Diagnosis not present

## 2016-11-06 DIAGNOSIS — M545 Low back pain: Secondary | ICD-10-CM | POA: Diagnosis not present

## 2016-11-06 NOTE — Patient Outreach (Signed)
[facility]) Care Management  11/06/2016  JAMARIE SEADER 22-Apr-1924 [id number]  I reached out to [name], dtr in law and primary caregiver for Melanie Lowe, to remind her of my visit today and [name] told me she'd forgotten about the visit and that Melanie Lowe was seeing Dr. Juanetta Gosling this morning at 11:15. She requested that I call to reschedule later this week.   Plan: I will reach out to Melanie Lowe this week by phone to reschedule our appointment.    Marja Kays MHA,BSN,RN,CCM [facility] Care Management  [phone]

## 2016-11-09 ENCOUNTER — Other Ambulatory Visit: Payer: Self-pay | Admitting: *Deleted

## 2016-11-09 NOTE — Patient Outreach (Signed)
[facility]) Care Management  11/09/2016  Melanie Lowe 11-Oct-1924 [id number]   Unable to reach Mrs. Higley or her daughter in [place], general practice by phone today, nor was I able to leave a voice message.   Plan: I will reach out to Mrs. Callins by phone again next week.    Marja Kays MHA,BSN,RN,CCM [facility] Care Management  [phone]

## 2016-11-11 DIAGNOSIS — R0902 Hypoxemia: Secondary | ICD-10-CM | POA: Diagnosis not present

## 2016-11-15 ENCOUNTER — Other Ambulatory Visit: Payer: Self-pay | Admitting: *Deleted

## 2016-11-15 NOTE — Patient Outreach (Signed)
[facility]) Care Management  11/15/2016  Melanie Lowe 1924-12-14 [id number]  Melanie Lowe is an 82 y.o. female with history of lower extremity cellulitis,CHF, AAA, MI, and COPD. Melanie Lowe was discharged from the hospital on 09/02/16 after an admission for CHF. [facility] Care Management has been following Melanie Lowe in the community for transition of care assessments, disease management, and care coordination. In addition, our social work team is involved in Melanie Lowe's care.   Assessment:  82 year old female living in [place] with her son and his wife.   Chronic Health Condition (CHF) - Melanie Lowe was discharged from the hospital 2 months ago after an admission for CHF. She has been to her primary care provider and cardiologist in follow up. She is taking all medications as prescribed. She is weighing daily and is strictly adhering to her prescribed low sodium/heart healthy diet. She reports that her weight remains stable right around 118 pounds. She is using O2 @ 2l/[place] continuously.  Chronic Health Condition (poor nutrition) - Melanie Lowe is eating twice daily; she says she drinks coffee, water, and diet soda daily; she is currently taking Ensure Protein supplements; we provided her with coupons to use for purchase of more supplements in the future.   Chronic Health Condition (COPD) - Melanie Lowe says today that her breathing has been "pretty good". She says she wishes she was able to get out and do more but is glad that she hasn't gotten the flu or had any respiratory complications in the last few weeks. She is taking all medications as prescribed.   I offered to reschedule our missed appointment from last week and she declined saying she would like for me to return a call to her next month but didn't feel she needed visits right now.   Plan:   Melanie Lowe will continue taking her medications as prescribed.   Melanie Lowe will weigh and record daily, calling her cardiology  provider for weight gain of 3# overnight or 5# in a week.   Melanie Lowe will call Dr. Juanetta Gosling for new or worsening skin symptoms.   Melanie Lowe will continue taking nutritional supplement (drink) once daily in addition to her meals.   I will call Melanie Lowe in December to follow up on her progress.    Marja Kays MHA,BSN,RN,CCM [facility] Care Management  [phone]

## 2016-11-19 ENCOUNTER — Encounter: Payer: Self-pay | Admitting: *Deleted

## 2016-11-19 NOTE — Telephone Encounter (Signed)
This encounter was created in error - please disregard.

## 2016-11-20 ENCOUNTER — Other Ambulatory Visit: Payer: Self-pay

## 2016-11-20 NOTE — Patient Outreach (Signed)
Documentation of pharmacy case closure  Erinn Huskins, PharmD, BCACP Assistant Clinical Director of Pharmacy Services [facility] [phone]  

## 2016-12-04 ENCOUNTER — Other Ambulatory Visit: Payer: Self-pay | Admitting: Licensed Clinical Social Worker

## 2016-12-04 NOTE — Patient Outreach (Signed)
Assessment:  CSW spoke via phone with client. CSW verified client identity. CSW and client spoke of client needs. Client receives support at home from her son, [name] and from [place], fiance of client's on. Client has prescribed medications and is taking medications as prescribed.  Client sees Dr. Juanetta Gosling as primary care doctor. Client has previously received Advanced Directives booklet and has been reviewing this booklet with her son to discuss wishes of client regarding advanced planning for client. Client receives [facility] nursing support with RN Marja Kays.Client said she uses [facility] to transport client to and from client's scheduled medical appointments. CSW and client spoke of client care plan. CSW encouraged client and her son, [name], to communicate with CSW in next 30 days to discuss advanced directives documents and completion process for advanced directives documents. Client receives Meals on Wheels support, as scheduled, weekly to home of client. Client uses oxygen, as prescribed, in the home to assist client with breathing needs of client.  Client sees cardiologist as scheduled. Client is attending all scheduled client medical appointments. Client does not have Food Stamps benefit.  Client is sleeping more in daytime hours.  Client likes to watch TV to relax.  Client is eating well.  Client is not using a cane or walker to ambulate at present.  CSW informed client and [name] that family representative could go to [facility] and talk with Garrel Ridgel about loaner closet equipment to see if [employer] had a walker or cane that client could use at this time on a loaner basis. CSW encouraged that client, [name] or [name] call CSW at [phone] as needed to discuss social work needs of client.    Plan:  Client and son, [name], to communicate with CSW in next 30 days to discuss advanced directives documents and completion process for advanced  directives documents.  CSW to collaborate with RN Marja Kays in monitoring needs of client.   CSW to call client or son, [name], in 4 weeks to assess client needs at that time.  Kelton Pillar.Quandarius Nill MSW, LCSW Licensed Clinical Social Worker [facility] Care Management [phone]

## 2016-12-10 ENCOUNTER — Ambulatory Visit: Payer: Commercial Managed Care - HMO | Admitting: Cardiology

## 2016-12-10 ENCOUNTER — Encounter: Payer: Self-pay | Admitting: Cardiology

## 2016-12-10 NOTE — Progress Notes (Deleted)
Clinical Summary [name] is a 82 y.o.female former patient of Dr Melanie Lowe, she last saw PA Melanie Lowe in 08/2016. This is our first visit together.  1. Chronic diastolic HF - echo 05/2016 LVEF 65-70%, grade I diastoilc dysfunction   2. COPD - on home O2  3. AAA - 8.1x7.7 AAA, thought high risk for surgical procedure.    4. Thoracic aortic aneurysm - 4.8 cm aortic root by echo.   5. Chest pain - history of prior chest pain, negative ACS workups during admissions -   6. HTN   Past Medical History:  Diagnosis Date  . AAA (abdominal aortic aneurysm) (HCC)   . Anxiety   . Arthritis   . CHF (congestive heart failure) (HCC) 11/09/2015  . COPD (chronic obstructive pulmonary disease) (HCC)   . Depression   . Hypertension   . Leg swelling   . Myocardial infarction   . Neuropathy (HCC)   . Oxygen dependent   . Pneumonia   . Stroke Melanie Lowe(HCC)      Allergies  Allergen Reactions  . Ciprofloxacin Hcl Nausea And Vomiting  . Nsaids     Per patients son due to AAA.  Can only take coated asprin  . Other     FOOD: Spice Triggers acid reflux  . Sulfur   . Tomato     Triggers acid reflux     Current Outpatient Prescriptions  Medication Sig Dispense Refill  . acetaminophen (TYLENOL) 500 MG tablet Take 250-500 mg by mouth daily as needed for mild pain or moderate pain. Reported on 04/11/2016    . albuterol (PROVENTIL) (2.5 MG/3ML) 0.083% nebulizer solution Take 2.5 mg by nebulization 4 (four) times daily.     Marland Kitchen. aspirin EC 81 MG tablet Take 81 mg by mouth daily.    . budesonide-formoterol (SYMBICORT) 160-4.5 MCG/ACT inhaler Inhale 2 puffs into the lungs 2 (two) times daily.     . cephALEXin (KEFLEX) 500 MG capsule Take 1 capsule (500 mg total) by mouth 3 (three) times daily. 21 capsule 0  . citalopram (CELEXA) 20 MG tablet Take 20 mg by mouth at bedtime.     . diazepam (VALIUM) 10 MG tablet Take 10 mg by mouth 3 (three) times daily as needed for anxiety.    . gabapentin (NEURONTIN)  100 MG capsule Take 100 mg by mouth 5 (five) times daily.     . isosorbide mononitrate (IMDUR) 30 MG 24 hr tablet Take 1 tablet (30 mg total) by mouth daily. 30 tablet 12  . meclizine (ANTIVERT) 25 MG tablet Take 1 tablet (25 mg total) by mouth 3 (three) times daily as needed for dizziness. (Patient not taking: Reported on 10/02/2016) 21 tablet 0  . nitroGLYCERIN (NITROSTAT) 0.4 MG SL tablet Place 0.4 mg under the tongue every 5 (five) minutes as needed for chest pain.    Marland Kitchen. oxyCODONE-acetaminophen (PERCOCET) 10-325 MG tablet Take 1 tablet by mouth every 6 (six) hours as needed for pain.    . predniSONE (DELTASONE) 20 MG tablet 2 tabs po daily x 3 days 6 tablet 0  . tiotropium (SPIRIVA) 18 MCG inhalation capsule Place 18 mcg into inhaler and inhale every evening.     . torsemide (DEMADEX) 20 MG tablet Take 1 tablet (20 mg total) by mouth 2 (two) times daily. 60 tablet 12   No current facility-administered medications for this visit.      Past Surgical History:  Procedure Laterality Date  . APPENDECTOMY    . CHOLECYSTECTOMY  Allergies  Allergen Reactions  . Ciprofloxacin Hcl Nausea And Vomiting  . Nsaids     Per patients son due to AAA.  Can only take coated asprin  . Other     FOOD: Spice Triggers acid reflux  . Sulfur   . Tomato     Triggers acid reflux      Family History  Problem Relation Age of Onset  . Diabetes Brother   . Cancer Brother     Throat cancer, Heart attack  . Heart disease Brother   . Heart attack Brother   . Cancer Father   . Cancer Sister      Social History [name] reports that she quit smoking about 5 years ago. Her smoking use included Cigarettes. She quit after 50.00 years of use. She quit smokeless tobacco use about 5 years ago. [name] reports that she does not drink alcohol.   Review of Systems CONSTITUTIONAL: No weight loss, fever, chills, weakness or fatigue.  HEENT: Eyes: No visual loss, blurred vision, double vision or yellow  sclerae.No hearing loss, sneezing, congestion, runny nose or sore throat.  SKIN: No rash or itching.  CARDIOVASCULAR:  RESPIRATORY: No shortness of breath, cough or sputum.  GASTROINTESTINAL: No anorexia, nausea, vomiting or diarrhea. No abdominal pain or blood.  GENITOURINARY: No burning on urination, no polyuria NEUROLOGICAL: No headache, dizziness, syncope, paralysis, ataxia, numbness or tingling in the extremities. No change in bowel or bladder control.  MUSCULOSKELETAL: No muscle, back pain, joint pain or stiffness.  LYMPHATICS: No enlarged nodes. No history of splenectomy.  PSYCHIATRIC: No history of depression or anxiety.  ENDOCRINOLOGIC: No reports of sweating, cold or heat intolerance. No polyuria or polydipsia.  Marland Kitchen   Physical Examination There were no vitals filed for this visit. There were no vitals filed for this visit.  Gen: resting comfortably, no acute distress HEENT: no scleral icterus, pupils equal round and reactive, no palptable cervical adenopathy,  CV Resp: Clear to auscultation bilaterally GI: abdomen is soft, non-tender, non-distended, normal bowel sounds, no hepatosplenomegaly MSK: extremities are warm, no edema.  Skin: warm, no rash Neuro:  no focal deficits Psych: appropriate affect   Diagnostic Studies     Assessment and Plan        Melanie Lowe, M.D., F.A.C.C.

## 2016-12-11 ENCOUNTER — Other Ambulatory Visit: Payer: Self-pay | Admitting: *Deleted

## 2016-12-11 DIAGNOSIS — R0902 Hypoxemia: Secondary | ICD-10-CM | POA: Diagnosis not present

## 2016-12-11 NOTE — Patient Outreach (Signed)
[facility]) Care Management  12/11/2016  Melanie Lowe 1924/10/07 [id number]  I was unable to reach [name] by phone today.   Plan: I will reach out to [name] later this week to follow up on her COPD, HTN, and health care management/care coordination needs.    Marja Kays MHA,BSN,RN,CCM [facility] Care Management  [phone]

## 2016-12-14 ENCOUNTER — Ambulatory Visit: Payer: Self-pay | Admitting: *Deleted

## 2016-12-17 ENCOUNTER — Other Ambulatory Visit: Payer: Self-pay | Admitting: *Deleted

## 2016-12-17 ENCOUNTER — Encounter: Payer: Self-pay | Admitting: *Deleted

## 2016-12-17 NOTE — Patient Outreach (Signed)
[facility]) Care Management  12/17/2016  Melanie Lowe 12-06-1924 [id number]   As noted in November, [name] has declined further nursing case management visits. I have been unable to reach her or her daughter in law [name] over the last few weeks by phone.   Plan: I will send a letter to [name] home today offering to continue case management services if she wishes. If I do not hear from her in 10 days, I will close her case. We are happy to provide assistance with [name]'s case management needs at any time.    Marja Kays MHA,BSN,RN,CCM [facility] Care Management  [phone]

## 2016-12-28 ENCOUNTER — Other Ambulatory Visit: Payer: Self-pay | Admitting: *Deleted

## 2016-12-28 NOTE — Patient Outreach (Signed)
[facility]) Care Management  12/28/2016  Melanie Lowe [birth year] [id number]  As noted in November, [name] has declined further nursing case management visits. I have been unable to reach her or her daughter in law [name] over the last few weeks by phone after several attempts and voice messages. I sent a letter to the home of [name] on 12/17/16 offering to continue case management services if [name] desired to do so but have not heard from her or her family members.   Plan: I will close [name] case. [facility] Care Management is happy to provide community case management assistance for [name] at any time in the future should she be in need and agree to services.    Marja Kays MHA,BSN,RN,CCM [facility] Care Management  [phone]

## 2017-01-01 ENCOUNTER — Encounter: Payer: Self-pay | Admitting: *Deleted

## 2017-01-07 ENCOUNTER — Other Ambulatory Visit: Payer: Self-pay | Admitting: Licensed Clinical Social Worker

## 2017-01-07 NOTE — Patient Outreach (Signed)
Assessment:  CSW spoke via phone with client. CSW verified client identity. CSW and client spoke of client needs.Client has support from her son, Maha Rearden and from [place] of her son.  Steffanie Rainwater of [place], [place], helps with much of the daily care needs of client. Client has prescribed medications and is taking medications as prescribed. Client is attending scheduled client medical appointments. [facility] RN Marja Kays discharged client recently from [facility] nursing services.   Client sees Dr. Juanetta Gosling as primary care doctor. Client has said previously that she uses [facility] to transport client to and from client's scheduled medical appointments.  CSW and client spoke of client care plan. CSW encouraged client and her son Danyah Stuckwisch to communicate with CSW in next 30 days to discuss Advanced Directives completion process for client.  Client receives Meals on Wheels support weekly, as scheduled. Client uses oxygen, as prescribed,in the home to help her with breathing needs of client. Client sees cardiologist as scheduled.CSW had spoken with client and son Ajooni Sanda about family representative going to [facility] to speak with Garrel Ridgel about cane or walker for client from [place].  Client or son have not gone to [facility] to speak with Garrel Ridgel about loaner closet equipment items available. Client is eating well and sleeping well. CSW encouraged that client or Makalynn Kaake call CSW at [phone] as needed to discuss social work needs of client.    Plan:  Client and son, Tarri Abernethy, to communicate with CSW in next 30 days to discuss Advanced Directives completion process for client.  CSW to call client or son, Maudine Mcaskill, in 4 weeks to assess client needs at that time.   Kelton Pillar.Ethelean Colla MSW, LCSW Licensed Clinical Social Worker [facility] Care Management [phone]

## 2017-01-11 DIAGNOSIS — R0902 Hypoxemia: Secondary | ICD-10-CM | POA: Diagnosis not present

## 2017-01-17 ENCOUNTER — Other Ambulatory Visit: Payer: Self-pay | Admitting: Licensed Clinical Social Worker

## 2017-01-17 ENCOUNTER — Encounter: Payer: Self-pay | Admitting: Licensed Clinical Social Worker

## 2017-01-17 NOTE — Patient Outreach (Signed)
Assessment:  CSW spoke via phone with client on 01/17/17. CSW verified identity of client. CSW and client spoke of client needs. Client has prescribed medications and is taking medications as prescribed. She sees Dr.Hawkins as primary care doctor. Client is attending scheduled medical appointments for client. Client has support from her son, Malin Sambrano. Client also has support from [place], fiancee of [employer]. CSW informed client that client had received information regarding Advanced Directives and Advanced Directives completion for client. Harlin Rain and client have talked about Advanced Directives completion for client. CSW has talked with client and Elisabella Hacker about Advanced Directives completion process for client. CSW informed client that client had met [facility] CSW care plan goals for client. CSW informed client that [facility] would discharge client on 01/17/17 from [facility] services. Client agreed to this plan.  CSW encouraged client to continue attending her scheduled medical appointments.  CSW thanked client for participating in [facility] program support.   Plan:  CSW is discharging [place] on 01/17/17 from [facility] services since client has met her care plan goals with [facility] CSW services.  CSW to inform Josepha Pigg that [facility] discharged client on 01/17/17 from [facility] services.  CSW to fax physician case closure letter to Dr. Luan Pulling informing Dr. Luan Pulling that [facility] discharged client on 01/17/17 from [facility] CSW services.  Norva Riffle.Mattisen Pohlmann MSW, LCSW Licensed Clinical Social Worker [facility] Care Management [phone]

## 2017-01-18 ENCOUNTER — Emergency Department (HOSPITAL_COMMUNITY)
Admission: EM | Admit: 2017-01-18 | Discharge: 2017-01-19 | Disposition: A | Discharge status: Home / Self Care | Payer: Medicare HMO | Attending: Emergency Medicine | Admitting: Emergency Medicine

## 2017-01-18 DIAGNOSIS — I5033 Acute on chronic diastolic (congestive) heart failure: Secondary | ICD-10-CM | POA: Insufficient documentation

## 2017-01-18 DIAGNOSIS — Z79899 Other long term (current) drug therapy: Secondary | ICD-10-CM | POA: Diagnosis not present

## 2017-01-18 DIAGNOSIS — J111 Influenza due to unidentified influenza virus with other respiratory manifestations: Secondary | ICD-10-CM

## 2017-01-18 DIAGNOSIS — J029 Acute pharyngitis, unspecified: Secondary | ICD-10-CM

## 2017-01-18 DIAGNOSIS — N183 Chronic kidney disease, stage 3 (moderate): Secondary | ICD-10-CM | POA: Diagnosis not present

## 2017-01-18 DIAGNOSIS — Z87891 Personal history of nicotine dependence: Secondary | ICD-10-CM | POA: Insufficient documentation

## 2017-01-18 DIAGNOSIS — R0602 Shortness of breath: Secondary | ICD-10-CM | POA: Diagnosis not present

## 2017-01-18 DIAGNOSIS — R69 Illness, unspecified: Secondary | ICD-10-CM

## 2017-01-18 DIAGNOSIS — R0902 Hypoxemia: Secondary | ICD-10-CM | POA: Diagnosis not present

## 2017-01-18 DIAGNOSIS — I130 Hypertensive heart and chronic kidney disease with heart failure and stage 1 through stage 4 chronic kidney disease, or unspecified chronic kidney disease: Secondary | ICD-10-CM | POA: Insufficient documentation

## 2017-01-18 DIAGNOSIS — R070 Pain in throat: Secondary | ICD-10-CM | POA: Diagnosis not present

## 2017-01-18 DIAGNOSIS — R05 Cough: Secondary | ICD-10-CM | POA: Insufficient documentation

## 2017-01-18 DIAGNOSIS — Z7982 Long term (current) use of aspirin: Secondary | ICD-10-CM | POA: Diagnosis not present

## 2017-01-18 DIAGNOSIS — J449 Chronic obstructive pulmonary disease, unspecified: Secondary | ICD-10-CM | POA: Insufficient documentation

## 2017-01-18 DIAGNOSIS — R061 Stridor: Secondary | ICD-10-CM | POA: Diagnosis not present

## 2017-01-18 DIAGNOSIS — J1000 Influenza due to other identified influenza virus with unspecified type of pneumonia: Secondary | ICD-10-CM | POA: Diagnosis not present

## 2017-01-18 NOTE — ED Triage Notes (Signed)
Pt in by ems for sore throat, cough, congestion

## 2017-01-19 ENCOUNTER — Emergency Department (HOSPITAL_COMMUNITY): Payer: Medicare HMO

## 2017-01-19 ENCOUNTER — Other Ambulatory Visit: Payer: Self-pay

## 2017-01-19 ENCOUNTER — Encounter (HOSPITAL_COMMUNITY): Payer: Self-pay | Admitting: Emergency Medicine

## 2017-01-19 ENCOUNTER — Emergency Department (HOSPITAL_COMMUNITY)
Admission: EM | Admit: 2017-01-19 | Discharge: 2017-01-19 | Disposition: A | Discharge status: Home / Self Care | Payer: Medicare HMO | Source: Home / Self Care | Attending: Emergency Medicine | Admitting: Emergency Medicine

## 2017-01-19 DIAGNOSIS — J441 Chronic obstructive pulmonary disease with (acute) exacerbation: Secondary | ICD-10-CM | POA: Insufficient documentation

## 2017-01-19 DIAGNOSIS — I130 Hypertensive heart and chronic kidney disease with heart failure and stage 1 through stage 4 chronic kidney disease, or unspecified chronic kidney disease: Secondary | ICD-10-CM | POA: Insufficient documentation

## 2017-01-19 DIAGNOSIS — Z79899 Other long term (current) drug therapy: Secondary | ICD-10-CM | POA: Insufficient documentation

## 2017-01-19 DIAGNOSIS — R0602 Shortness of breath: Secondary | ICD-10-CM | POA: Diagnosis not present

## 2017-01-19 DIAGNOSIS — R6889 Other general symptoms and signs: Secondary | ICD-10-CM

## 2017-01-19 DIAGNOSIS — J3489 Other specified disorders of nose and nasal sinuses: Secondary | ICD-10-CM | POA: Insufficient documentation

## 2017-01-19 DIAGNOSIS — J029 Acute pharyngitis, unspecified: Secondary | ICD-10-CM

## 2017-01-19 DIAGNOSIS — R220 Localized swelling, mass and lump, head: Secondary | ICD-10-CM

## 2017-01-19 DIAGNOSIS — Z87891 Personal history of nicotine dependence: Secondary | ICD-10-CM

## 2017-01-19 DIAGNOSIS — Z7982 Long term (current) use of aspirin: Secondary | ICD-10-CM

## 2017-01-19 DIAGNOSIS — J1000 Influenza due to other identified influenza virus with unspecified type of pneumonia: Secondary | ICD-10-CM | POA: Diagnosis not present

## 2017-01-19 DIAGNOSIS — N183 Chronic kidney disease, stage 3 (moderate): Secondary | ICD-10-CM

## 2017-01-19 DIAGNOSIS — R0789 Other chest pain: Secondary | ICD-10-CM | POA: Diagnosis not present

## 2017-01-19 DIAGNOSIS — R279 Unspecified lack of coordination: Secondary | ICD-10-CM | POA: Diagnosis not present

## 2017-01-19 DIAGNOSIS — R1032 Left lower quadrant pain: Secondary | ICD-10-CM | POA: Diagnosis not present

## 2017-01-19 DIAGNOSIS — I509 Heart failure, unspecified: Secondary | ICD-10-CM

## 2017-01-19 DIAGNOSIS — M279 Disease of jaws, unspecified: Secondary | ICD-10-CM | POA: Diagnosis not present

## 2017-01-19 DIAGNOSIS — R05 Cough: Secondary | ICD-10-CM | POA: Insufficient documentation

## 2017-01-19 DIAGNOSIS — R001 Bradycardia, unspecified: Secondary | ICD-10-CM | POA: Diagnosis not present

## 2017-01-19 DIAGNOSIS — Z7401 Bed confinement status: Secondary | ICD-10-CM | POA: Diagnosis not present

## 2017-01-19 LAB — BASIC METABOLIC PANEL
ANION GAP: 9 (ref 5–15)
BUN: 44 mg/dL — AB (ref 6–20)
CO2: 28 mmol/L (ref 22–32)
Calcium: 8.9 mg/dL (ref 8.9–10.3)
Chloride: 98 mmol/L — ABNORMAL LOW (ref 101–111)
Creatinine, Ser: 1.29 mg/dL — ABNORMAL HIGH (ref 0.44–1.00)
GFR calc Af Amer: 41 mL/min — ABNORMAL LOW (ref >60)
GFR calc non Af Amer: 35 mL/min — ABNORMAL LOW (ref >60)
GLUCOSE: 113 mg/dL — AB (ref 65–99)
POTASSIUM: 3.6 mmol/L (ref 3.5–5.1)
Sodium: 135 mmol/L (ref 135–145)

## 2017-01-19 LAB — CBC WITH DIFFERENTIAL/PLATELET
Basophils Absolute: 0 10*3/uL (ref 0.0–0.1)
Basophils Relative: 0 %
EOS PCT: 4 %
Eosinophils Absolute: 0.2 10*3/uL (ref 0.0–0.7)
HEMATOCRIT: 35.9 % — AB (ref 36.0–46.0)
Hemoglobin: 12 g/dL (ref 12.0–15.0)
LYMPHS ABS: 1.8 10*3/uL (ref 0.7–4.0)
LYMPHS PCT: 38 %
MCH: 31.1 pg (ref 26.0–34.0)
MCHC: 33.4 g/dL (ref 30.0–36.0)
MCV: 93 fL (ref 78.0–100.0)
MONO ABS: 0.5 10*3/uL (ref 0.1–1.0)
Monocytes Relative: 11 %
NEUTROS ABS: 2.2 10*3/uL (ref 1.7–7.7)
Neutrophils Relative %: 47 %
Platelets: 199 10*3/uL (ref 150–400)
RBC: 3.86 MIL/uL — ABNORMAL LOW (ref 3.87–5.11)
RDW: 13.9 % (ref 11.5–15.5)
WBC: 4.8 10*3/uL (ref 4.0–10.5)

## 2017-01-19 LAB — TROPONIN I: Troponin I: <0.03 ng/mL (ref <0.03)

## 2017-01-19 LAB — RAPID STREP SCREEN (MED CTR MEBANE ONLY): Streptococcus, Group A Screen (Direct): NEGATIVE

## 2017-01-19 MED ORDER — OSELTAMIVIR PHOSPHATE 30 MG PO CAPS: 30.0000 mg | ORAL_CAPSULE | Freq: Two times a day (BID) | ORAL | 0 refills | Status: DC

## 2017-01-19 NOTE — ED Notes (Signed)
Discharged delayed due to no one here with pt

## 2017-01-19 NOTE — ED Notes (Signed)
Attempted to call pt son, [name], at [phone] for update on pt dispo- no answer

## 2017-01-19 NOTE — ED Provider Notes (Signed)
[facility]-EM[facility]Y DEPT Provider Note   CSN: [id number] Arrival date & time: 01/19/17  1226  By signing my name below, I, Arianna Nassar, attest that this documentation has been prepared under the direction and in the presence of Nira Conn, MD.  Electronically Signed: Octavia Heir, ED Scribe. 01/19/17. 1:01 PM.   History   Chief Complaint Chief Complaint  Patient presents with  . Influenza    The history is provided by the patient and the EMS personnel. No language interpreter was used.   HPI Comments: Melanie Lowe is a 83 y.o. female brought in by ambulance, who has a PMhx of COPD, CHF, AAA, MI, pneumonia, and HTN presents to the Emergency Department complaining of gradual worsening, moderate, sore throat x yesterday. Pt has been having an associated productive cough that brings up yellow-tinted sputum and rhinorrhea. She reports associated lower lip swelling that began this morning after being discharged. Pt was seen in the ED last night for similar symptoms and was diagnosed with influenza like illness. She was discharged this morning with tamiflu but notes she has not been taking it because it has not been filled. Pt currently lives at home with her son. She is on home O2 continuously. Pt has not been taking any OTC medications to alleviate her symptoms. She denies nausea, vomiting, or any other associated symptoms.   Past Medical History:  Diagnosis Date  . AAA (abdominal aortic aneurysm) (HCC)   . Anxiety   . Arthritis   . CHF (congestive heart failure) (HCC) 11/09/2015  . COPD (chronic obstructive pulmonary disease) (HCC)   . Depression   . Hypertension   . Leg swelling   . Myocardial infarction   . Neuropathy (HCC)   . Oxygen dependent   . Pneumonia   . Stroke [facility])     Patient Active Problem List   Diagnosis Date Noted  . CKD (chronic kidney disease) stage 3, GFR 30-59 ml/min 09/02/2016  . Diastolic dysfunction 09/02/2016  . Malnutrition of moderate degree  04/13/2016  . COPD exacerbation (HCC) 04/11/2016  . Anxiety 04/11/2016  . PNA (pneumonia) 04/11/2016  . Acute on chronic diastolic heart failure (HCC) 11/12/2015  . CHF (congestive heart failure) (HCC) 11/09/2015  . Acute on chronic respiratory failure (HCC) 11/05/2015  . Palliative care encounter   . DNR (do not resuscitate)   . Diverticulitis 11/02/2015  . Acute diverticulitis 11/02/2015  . AKI (acute kidney injury) (HCC) 11/02/2015  . Anemia 11/02/2015  . RLL pneumonia (HCC) 11/02/2015  . Pneumonia 04/12/2015  . Failure to thrive in adult 11/11/2014  . Acute encephalopathy 11/11/2014  . Generalized weakness 11/11/2014  . Hypokalemia 11/11/2014  . Bradycardia 11/11/2014  . Dehydration 01/26/2014  . Orthostatic dizziness 01/24/2014  . SOB (shortness of breath) 01/24/2014  . Chronic respiratory failure with hypoxia (HCC) 01/24/2014  . Cough with hemoptysis 01/24/2014  . COPD (chronic obstructive pulmonary disease) (HCC) 01/24/2014  . Acute renal failure (HCC) 01/24/2014  . Dizzy 01/24/2014  . Acute-on-chronic respiratory failure (HCC) 10/12/2012  . Chest pain 10/10/2012  . CAP (community acquired pneumonia) 10/10/2012  . Aortic aneurysm (HCC) 04/16/2012  . Chest pain 08/01/2011  . HYPERCALCEMIA 06/16/2008  . ALLERGIC RHINITIS 12/05/2007  . WEIGHT GAIN 11/07/2007  . PAIN IN JOINT, LOWER LEG 10/09/2007  . TOBACCO ABUSE 09/29/2007  . Abdominal aortic aneurysm (HCC) 09/29/2007  . Sciatica 09/24/2007  . BACK PAIN 09/24/2007  . Anxiety disorder 11/28/2006  . DEPRESSION 11/28/2006  . CATARACT NOS 11/28/2006  . Essential  hypertension 11/28/2006  . MYOCARDIAL INFARCTION, HX OF 11/28/2006  . COPD 11/28/2006  . OVERACTIVE BLADDER 11/28/2006  . UTI'S, RECURRENT 11/28/2006  . LOW BACK PAIN 11/28/2006  . URINARY INCONTINENCE 11/28/2006    Past Surgical History:  Procedure Laterality Date  . APPENDECTOMY    . CHOLECYSTECTOMY      OB History    Gravida Para Term Preterm AB  Living   5 5 5          SAB TAB Ectopic Multiple Live Births                   Home Medications    Prior to Admission medications   Medication Sig Start Date End Date Taking? Authorizing Provider  acetaminophen (TYLENOL) 500 MG tablet Take 250-500 mg by mouth daily as needed for mild pain or moderate pain. Reported on 04/11/2016    Historical Provider, MD  albuterol (PROVENTIL) (2.5 MG/3ML) 0.083% nebulizer solution Take 2.5 mg by nebulization 4 (four) times daily.     Historical Provider, MD  aspirin EC 81 MG tablet Take 81 mg by mouth daily.    Historical Provider, MD  budesonide-formoterol (SYMBICORT) 160-4.5 MCG/ACT inhaler Inhale 2 puffs into the lungs 2 (two) times daily.     Historical Provider, MD  cephALEXin (KEFLEX) 500 MG capsule Take 1 capsule (500 mg total) by mouth 3 (three) times daily. 09/25/16   Blane Ohara, MD  citalopram (CELEXA) 20 MG tablet Take 20 mg by mouth at bedtime.     Historical Provider, MD  diazepam (VALIUM) 10 MG tablet Take 10 mg by mouth 3 (three) times daily as needed for anxiety.    Historical Provider, MD  gabapentin (NEURONTIN) 100 MG capsule Take 100 mg by mouth 5 (five) times daily.  09/06/15   Historical Provider, MD  isosorbide mononitrate (IMDUR) 30 MG 24 hr tablet Take 1 tablet (30 mg total) by mouth daily. 06/07/16   Kari Baars, MD  meclizine (ANTIVERT) 25 MG tablet Take 1 tablet (25 mg total) by mouth 3 (three) times daily as needed for dizziness. Patient not taking: Reported on 10/02/2016 05/14/16   Ivery Quale, PA-C  nitroGLYCERIN (NITROSTAT) 0.4 MG SL tablet Place 0.4 mg under the tongue every 5 (five) minutes as needed for chest pain. 08/17/16   Historical Provider, MD  oseltamivir (TAMIFLU) 30 MG capsule Take 1 capsule (30 mg total) by mouth 2 (two) times daily. 01/19/17   [name], MD  oxyCODONE-acetaminophen (PERCOCET) 10-325 MG tablet Take 1 tablet by mouth every 6 (six) hours as needed for pain.    Kari Baars, MD  predniSONE  (DELTASONE) 20 MG tablet 2 tabs po daily x 3 days 10/10/16   [name], MD  tiotropium (SPIRIVA) 18 MCG inhalation capsule Place 18 mcg into inhaler and inhale every evening.     Historical Provider, MD  torsemide (DEMADEX) 20 MG tablet Take 1 tablet (20 mg total) by mouth 2 (two) times daily. 11/12/15   Kari Baars, MD    Family History Family History  Problem Relation Age of Onset  . Diabetes Brother   . Cancer Brother     Throat cancer, Heart attack  . Heart disease Brother   . Heart attack Brother   . Cancer Father   . Cancer Sister     Social History Social History  Substance Use Topics  . Smoking status: Former Smoker    Years: 50.00    Types: Cigarettes    Quit date: 12/31/2010  .  Smokeless tobacco: Former Neurosurgeon    Quit date: 12/31/2010  . Alcohol use No     Allergies   Ciprofloxacin hcl; Nsaids; Other; Sulfur; and Tomato   Review of Systems Review of Systems  A complete 10 system review of systems was obtained and all systems are negative except as noted in the HPI and PMH.   Physical Exam Updated Vital Signs Initial vitals: BP 128/68 (BP Location: Left Arm)   Pulse (!) 51   Temp 97.9 F (36.6 C) (Oral)   Resp 18   Ht 5\' 7"  (1.702 m)   Wt 122 lb (55.3 kg)   SpO2 96%   BMI 19.11 kg/m   Physical Exam  Constitutional: She is oriented to person, place, and time. She appears well-developed and well-nourished. No distress.  Ill appearing   HENT:  Head: Normocephalic and atraumatic.  Nose: Nose normal.  Mouth/Throat: No oral lesions. No trismus in the jaw. No uvula swelling. No oropharyngeal exudate, posterior oropharyngeal edema or posterior oropharyngeal erythema. No tonsillar exudate.  Oropharynx is mildly irritated, no swelling of the lips  Eyes: Conjunctivae and EOM are normal. Pupils are equal, round, and reactive to light. Right eye exhibits no discharge. Left eye exhibits no discharge. No scleral icterus.  Neck: Normal range of motion. Neck  supple.  Cardiovascular: Normal rate and regular rhythm.  Exam reveals no gallop and no friction rub.   No murmur heard. Pulmonary/Chest: Effort normal and breath sounds normal. No stridor. No respiratory distress. She has no rales.  Abdominal: Soft. She exhibits no distension. There is no tenderness.  Musculoskeletal: She exhibits no edema or tenderness.  Neurological: She is alert and oriented to person, place, and time.  Skin: Skin is warm and dry. No rash noted. She is not diaphoretic. No erythema.  Psychiatric: She has a normal mood and affect.  Vitals reviewed.    ED Treatments / Results  DIAGNOSTIC STUDIES: Oxygen Saturation is 96% on 3 L/min [place], adequate by my interpretation.  COORDINATION OF CARE:  12:58 PM Discussed treatment plan with pt at bedside and pt agreed to plan.  Labs (all labs ordered are listed, but only abnormal results are displayed) Labs Reviewed - No data to display  EKG  EKG Interpretation None       Radiology Dg Chest 2 View  Result Date: 01/19/2017 CLINICAL DATA:  83 y/o F; productive cough and left-sided chest pain. EXAM: CHEST  2 VIEW COMPARISON:  10/10/2016 chest radiograph FINDINGS: Stable mild cardiomegaly. Stable linear opacities in the lung bases probably representing scarring and atelectasis. No new focal consolidation of the lungs. Aortic atherosclerosis with calcification. Moderate dextrocurvature of the lower thoracic spine. Increased AP diameter of the chest and flattened diaphragms compatible with COPD. IMPRESSION: No active cardiopulmonary disease. Stable scarring in the lung bases. Aortic atherosclerosis. Mild cardiomegaly. Electronically Signed   By: Mitzi Hansen M.D.   On: 01/19/2017 02:04    Procedures Procedures (including critical care time)  Medications Ordered in ED Medications - No data to display   Initial Impression / Assessment and Plan / ED Course  I have reviewed the triage vital signs and the nursing  notes.  Pertinent labs & imaging results that were available during my care of the patient were reviewed by me and considered in my medical decision making (see chart for details).     Patient was just discharged early this morning for flulike symptoms and prescribed Tamiflu which she has not filled yet. Patient is reporting that  swelling however there is no evidence of swelling noted on my examination. Airways intact, patient is not in any respiratory distress. No major complaint right now is the sore throat for which she has not taken anything. There are previous workup rapid strep was negative, ultra currently pending. However, exam is no evidence of pharyngitis. She does have some mild irritation likely due to postnasal drip.  We'll provide patient with tea.  The patient is safe for discharge with strict return precautions.   Final Clinical Impressions(s) / ED Diagnoses   Final diagnoses:  Flu-like symptoms   Disposition: Discharge  Condition: Good  I have discussed the results, Dx and Tx plan with the patient who expressed understanding and agree(s) with the plan. Discharge instructions discussed at great length. The patient was given strict return precautions who verbalized understanding of the instructions. No further questions at time of discharge.    New Prescriptions   No medications on file    Follow Up: Kari Baars, MD [address] [place] [address] [phone]  Schedule an appointment as soon as possible for a visit  in 5-7 days, If symptoms do not improve or  worsen   I personally performed the services described in this documentation, which was scribed in my presence. The recorded information has been reviewed and is accurate.        Nira Conn, MD 01/19/17 1339

## 2017-01-19 NOTE — ED Notes (Signed)
Missed bedpan- pt cleansed and changed

## 2017-01-19 NOTE — ED Triage Notes (Signed)
Pt discharged this am, has not filled her prescription and states she plans to stay here

## 2017-01-19 NOTE — ED Notes (Signed)
Call to [phone] by CN a gentleman answered saying they were awaiting a ride and would be here

## 2017-01-19 NOTE — Discharge Instructions (Signed)
Take the medication as prescribed for possible flu. Keep yourself hydrated. Followup with Dr. Juanetta Gosling. Return to the ED if you develop new or  worsening symptoms.

## 2017-01-19 NOTE — ED Provider Notes (Signed)
[facility]-EMERGENCY DEPT Provider Note   CSN: [id number] Arrival date & time: 01/18/17  2355  By signing my name below, I, Linna Darner, attest that this documentation has been prepared under the direction and in the presence of physician practitioner, [name], MD. Electronically Signed: Linna Darner, Scribe. 01/19/2017. 12:39 AM.  History   Chief Complaint Chief Complaint  Patient presents with  . Sore Throat    The history is provided by the patient. No language interpreter was used.     HPI Comments: Melanie Lowe is a 83 y.o. female who presents to the Emergency Department via EMS complaining of a constant sore throat beginning 2 hours ago. She reports an associated productive cough with yellow-tinted sputum and some moderate left-sided chest pain that began in the ED tonight. She states her sore throat is exacerbated by swallowing. Pt reports SOB/wheezing at baseline d/t COPD and states these symptoms have worsened over the last 2 hours. She also notes some persistent diarrhea recently. Pt reports a subjective fever yesterday. No known sick contacts with similar symptoms. She wears oxygen at home continuously. She denies nausea, vomiting, or any other associated symptoms. Pt lives with her son at home.  PCP: Dr. Juanetta Gosling  Past Medical History:  Diagnosis Date  . AAA (abdominal aortic aneurysm) (HCC)   . Anxiety   . Arthritis   . CHF (congestive heart failure) (HCC) 11/09/2015  . COPD (chronic obstructive pulmonary disease) (HCC)   . Depression   . Hypertension   . Leg swelling   . Myocardial infarction   . Neuropathy (HCC)   . Oxygen dependent   . Pneumonia   . Stroke [facility])     Patient Active Problem List   Diagnosis Date Noted  . CKD (chronic kidney disease) stage 3, GFR 30-59 ml/min 09/02/2016  . Diastolic dysfunction 09/02/2016  . Malnutrition of moderate degree 04/13/2016  . COPD exacerbation (HCC) 04/11/2016  . Anxiety 04/11/2016  . PNA (pneumonia)  04/11/2016  . Acute on chronic diastolic heart failure (HCC) 11/12/2015  . CHF (congestive heart failure) (HCC) 11/09/2015  . Acute on chronic respiratory failure (HCC) 11/05/2015  . Palliative care encounter   . DNR (do not resuscitate)   . Diverticulitis 11/02/2015  . Acute diverticulitis 11/02/2015  . AKI (acute kidney injury) (HCC) 11/02/2015  . Anemia 11/02/2015  . RLL pneumonia (HCC) 11/02/2015  . Pneumonia 04/12/2015  . Failure to thrive in adult 11/11/2014  . Acute encephalopathy 11/11/2014  . Generalized weakness 11/11/2014  . Hypokalemia 11/11/2014  . Bradycardia 11/11/2014  . Dehydration 01/26/2014  . Orthostatic dizziness 01/24/2014  . SOB (shortness of breath) 01/24/2014  . Chronic respiratory failure with hypoxia (HCC) 01/24/2014  . Cough with hemoptysis 01/24/2014  . COPD (chronic obstructive pulmonary disease) (HCC) 01/24/2014  . Acute renal failure (HCC) 01/24/2014  . Dizzy 01/24/2014  . Acute-on-chronic respiratory failure (HCC) 10/12/2012  . Chest pain 10/10/2012  . CAP (community acquired pneumonia) 10/10/2012  . Aortic aneurysm (HCC) 04/16/2012  . Chest pain 08/01/2011  . HYPERCALCEMIA 06/16/2008  . ALLERGIC RHINITIS 12/05/2007  . WEIGHT GAIN 11/07/2007  . PAIN IN JOINT, LOWER LEG 10/09/2007  . TOBACCO ABUSE 09/29/2007  . Abdominal aortic aneurysm (HCC) 09/29/2007  . Sciatica 09/24/2007  . BACK PAIN 09/24/2007  . Anxiety disorder 11/28/2006  . DEPRESSION 11/28/2006  . CATARACT NOS 11/28/2006  . Essential hypertension 11/28/2006  . MYOCARDIAL INFARCTION, HX OF 11/28/2006  . COPD 11/28/2006  . OVERACTIVE BLADDER 11/28/2006  . UTI'S, RECURRENT 11/28/2006  .  LOW BACK PAIN 11/28/2006  . URINARY INCONTINENCE 11/28/2006    Past Surgical History:  Procedure Laterality Date  . APPENDECTOMY    . CHOLECYSTECTOMY      OB History    Gravida Para Term Preterm AB Living   5 5 5          SAB TAB Ectopic Multiple Live Births                   Home  Medications    Prior to Admission medications   Medication Sig Start Date End Date Taking? Authorizing Provider  acetaminophen (TYLENOL) 500 MG tablet Take 250-500 mg by mouth daily as needed for mild pain or moderate pain. Reported on 04/11/2016    Historical Provider, MD  albuterol (PROVENTIL) (2.5 MG/3ML) 0.083% nebulizer solution Take 2.5 mg by nebulization 4 (four) times daily.     Historical Provider, MD  aspirin EC 81 MG tablet Take 81 mg by mouth daily.    Historical Provider, MD  budesonide-formoterol (SYMBICORT) 160-4.5 MCG/ACT inhaler Inhale 2 puffs into the lungs 2 (two) times daily.     Historical Provider, MD  cephALEXin (KEFLEX) 500 MG capsule Take 1 capsule (500 mg total) by mouth 3 (three) times daily. 09/25/16   Blane Ohara, MD  citalopram (CELEXA) 20 MG tablet Take 20 mg by mouth at bedtime.     Historical Provider, MD  diazepam (VALIUM) 10 MG tablet Take 10 mg by mouth 3 (three) times daily as needed for anxiety.    Historical Provider, MD  gabapentin (NEURONTIN) 100 MG capsule Take 100 mg by mouth 5 (five) times daily.  09/06/15   Historical Provider, MD  isosorbide mononitrate (IMDUR) 30 MG 24 hr tablet Take 1 tablet (30 mg total) by mouth daily. 06/07/16   Kari Baars, MD  meclizine (ANTIVERT) 25 MG tablet Take 1 tablet (25 mg total) by mouth 3 (three) times daily as needed for dizziness. Patient not taking: Reported on 10/02/2016 05/14/16   Ivery Quale, PA-C  nitroGLYCERIN (NITROSTAT) 0.4 MG SL tablet Place 0.4 mg under the tongue every 5 (five) minutes as needed for chest pain. 08/17/16   Historical Provider, MD  oxyCODONE-acetaminophen (PERCOCET) 10-325 MG tablet Take 1 tablet by mouth every 6 (six) hours as needed for pain.    Kari Baars, MD  predniSONE (DELTASONE) 20 MG tablet 2 tabs po daily x 3 days 10/10/16   [name], MD  tiotropium (SPIRIVA) 18 MCG inhalation capsule Place 18 mcg into inhaler and inhale every evening.     Historical Provider, MD  torsemide  (DEMADEX) 20 MG tablet Take 1 tablet (20 mg total) by mouth 2 (two) times daily. 11/12/15   Kari Baars, MD    Family History Family History  Problem Relation Age of Onset  . Diabetes Brother   . Cancer Brother     Throat cancer, Heart attack  . Heart disease Brother   . Heart attack Brother   . Cancer Father   . Cancer Sister     Social History Social History  Substance Use Topics  . Smoking status: Former Smoker    Years: 50.00    Types: Cigarettes    Quit date: 12/31/2010  . Smokeless tobacco: Former Neurosurgeon    Quit date: 12/31/2010  . Alcohol use No     Allergies   Ciprofloxacin hcl; Nsaids; Other; Sulfur; and Tomato   Review of Systems Review of Systems  A complete 10 system review of systems was obtained and all  systems are negative except as noted in the HPI and PMH.   Physical Exam Updated Vital Signs BP 119/83 (BP Location: Left Arm)   Pulse 70   Temp 97.7 F (36.5 C) (Oral)   Resp 16   SpO2 93%   Physical Exam  Constitutional: She is oriented to person, place, and time. She appears well-developed and well-nourished. No distress.  HENT:  Head: Normocephalic and atraumatic.  Mouth/Throat: Uvula is midline. Posterior oropharyngeal erythema present. No oropharyngeal exudate.  Erythematous oropharynx. No asymmetry. Hoarse voice.  Eyes: Conjunctivae and EOM are normal. Pupils are equal, round, and reactive to light.  Neck: Normal range of motion. Neck supple.  No meningismus.  Cardiovascular: Normal rate, regular rhythm, normal heart sounds and intact distal pulses.   No murmur heard. Pulmonary/Chest: Effort normal and breath sounds normal. No respiratory distress. She exhibits tenderness.  Left chest wall is tender to palpation. Lungs CTA.  Abdominal: Soft. There is no tenderness. There is no rebound and no guarding.  Musculoskeletal: Normal range of motion. She exhibits no edema or tenderness.  Neurological: She is alert and oriented to person, place,  and time. No cranial nerve deficit. She exhibits normal muscle tone. Coordination normal.   5/5 strength throughout. CN 2-12 intact.Equal grip strength.   Skin: Skin is warm.  Psychiatric: She has a normal mood and affect. Her behavior is normal.  Nursing note and vitals reviewed.    ED Treatments / Results  Labs (all labs ordered are listed, but only abnormal results are displayed) Labs Reviewed  CBC WITH DIFFERENTIAL/PLATELET - Abnormal; Notable for the following:       Result Value   RBC 3.86 (*)    HCT 35.9 (*)    All other components within normal limits  BASIC METABOLIC PANEL - Abnormal; Notable for the following:    Chloride 98 (*)    Glucose, Bld 113 (*)    BUN 44 (*)    Creatinine, Ser 1.29 (*)    GFR calc non Af Amer 35 (*)    GFR calc Af Amer 41 (*)    All other components within normal limits  RAPID STREP SCREEN (NOT AT [facility])  CULTURE, GROUP A STREP [facility])  TROPONIN I  TROPONIN I    EKG  EKG Interpretation  Date/Time:  Saturday January 19 2017 01:11:58 EST Ventricular Rate:  52 PR Interval:    QRS Duration: 101 QT Interval:  448 QTC Calculation: 417 R Axis:   34 Text Interpretation:  Sinus rhythm Low voltage, precordial leads No significant change was found Confirmed by Manus Gunning  MD, Xochilth Standish [phone]) on 01/19/2017 1:16:09 AM       Radiology Dg Chest 2 View  Result Date: 01/19/2017 CLINICAL DATA:  83 y/o F; productive cough and left-sided chest pain. EXAM: CHEST  2 VIEW COMPARISON:  10/10/2016 chest radiograph FINDINGS: Stable mild cardiomegaly. Stable linear opacities in the lung bases probably representing scarring and atelectasis. No new focal consolidation of the lungs. Aortic atherosclerosis with calcification. Moderate dextrocurvature of the lower thoracic spine. Increased AP diameter of the chest and flattened diaphragms compatible with COPD. IMPRESSION: No active cardiopulmonary disease. Stable scarring in the lung bases. Aortic atherosclerosis. Mild  cardiomegaly. Electronically Signed   By: Mitzi Hansen M.D.   On: 01/19/2017 02:04    Procedures Procedures (including critical care time)  DIAGNOSTIC STUDIES: Oxygen Saturation is 93% on [place], adequate by my interpretation.    COORDINATION OF CARE: 12:49 AM Discussed treatment plan with pt at  bedside and pt agreed to plan.  Medications Ordered in ED Medications - No data to display   Initial Impression / Assessment and Plan / ED Course  I have reviewed the triage vital signs and the nursing notes.  Pertinent labs & imaging results that were available during my care of the patient were reviewed by me and considered in my medical decision making (see chart for details).    Patient presents with sore throat, cough and congestion that onset about 2 hours ago. Also has left-sided chest pain is worse with palpation and movement. States pain with swallowing and hoarse voice.  Chest wall is sore to palpation.  EKG unchanged.  Chest pain appears atypical for ACS. Troponin negative 2. Chest x-ray negative. Pain worse with palpation. Rapid strep test is negative. Patient is tolerating by mouth. Denies any abdominal pain.  Patient with known large AAA. She has declined repair in the past after being told by vascular surgery that she was very high risk and a poor candidate for intervetion. She is aware that this is at risk of rupture. Denies any abdominal pain or back pain today.  Continues to c/o bodyaches, sore throat, chills.  CXR negative.  Will start empiric tamiflu. Followup with PCP. Return precautions discussed.  Final Clinical Impressions(s) / ED Diagnoses   Final diagnoses:  Pharyngitis, unspecified etiology  Influenza-like illness    New Prescriptions New Prescriptions   No medications on file   I personally performed the services described in this documentation, which was scribed in my presence. The recorded information has been reviewed and is accurate.      [name], MD 01/19/17 [phone]

## 2017-01-19 NOTE — ED Notes (Signed)
Call to number provided by family member identified as her daughter in law According to phone service has been changed or is not in service

## 2017-01-21 LAB — CULTURE, GROUP A STREP (THRC)

## 2017-02-04 ENCOUNTER — Emergency Department (HOSPITAL_COMMUNITY): Payer: Medicare HMO

## 2017-02-04 ENCOUNTER — Emergency Department (HOSPITAL_COMMUNITY)
Admission: EM | Admit: 2017-02-04 | Discharge: 2017-02-04 | Disposition: A | Discharge status: Home / Self Care | Payer: Medicare HMO | Attending: Emergency Medicine | Admitting: Emergency Medicine

## 2017-02-04 ENCOUNTER — Encounter (HOSPITAL_COMMUNITY): Payer: Self-pay | Admitting: Emergency Medicine

## 2017-02-04 DIAGNOSIS — I5033 Acute on chronic diastolic (congestive) heart failure: Secondary | ICD-10-CM | POA: Insufficient documentation

## 2017-02-04 DIAGNOSIS — H9209 Otalgia, unspecified ear: Secondary | ICD-10-CM | POA: Diagnosis not present

## 2017-02-04 DIAGNOSIS — I130 Hypertensive heart and chronic kidney disease with heart failure and stage 1 through stage 4 chronic kidney disease, or unspecified chronic kidney disease: Secondary | ICD-10-CM | POA: Diagnosis not present

## 2017-02-04 DIAGNOSIS — H5713 Ocular pain, bilateral: Secondary | ICD-10-CM | POA: Diagnosis not present

## 2017-02-04 DIAGNOSIS — N183 Chronic kidney disease, stage 3 (moderate): Secondary | ICD-10-CM | POA: Diagnosis not present

## 2017-02-04 DIAGNOSIS — R519 Headache, unspecified: Secondary | ICD-10-CM

## 2017-02-04 DIAGNOSIS — J441 Chronic obstructive pulmonary disease with (acute) exacerbation: Secondary | ICD-10-CM | POA: Insufficient documentation

## 2017-02-04 DIAGNOSIS — Z79899 Other long term (current) drug therapy: Secondary | ICD-10-CM | POA: Diagnosis not present

## 2017-02-04 DIAGNOSIS — R51 Headache: Secondary | ICD-10-CM | POA: Insufficient documentation

## 2017-02-04 DIAGNOSIS — H9201 Otalgia, right ear: Secondary | ICD-10-CM | POA: Diagnosis not present

## 2017-02-04 DIAGNOSIS — Z87891 Personal history of nicotine dependence: Secondary | ICD-10-CM | POA: Diagnosis not present

## 2017-02-04 DIAGNOSIS — Z7982 Long term (current) use of aspirin: Secondary | ICD-10-CM | POA: Diagnosis not present

## 2017-02-04 LAB — CBC WITH DIFFERENTIAL/PLATELET
Basophils Absolute: 0 10*3/uL (ref 0.0–0.1)
Basophils Relative: 0 %
EOS ABS: 0.2 10*3/uL (ref 0.0–0.7)
Eosinophils Relative: 5 %
HCT: 34.7 % — ABNORMAL LOW (ref 36.0–46.0)
HEMOGLOBIN: 11.7 g/dL — AB (ref 12.0–15.0)
LYMPHS ABS: 1.6 10*3/uL (ref 0.7–4.0)
LYMPHS PCT: 35 %
MCH: 31.3 pg (ref 26.0–34.0)
MCHC: 33.7 g/dL (ref 30.0–36.0)
MCV: 92.8 fL (ref 78.0–100.0)
Monocytes Absolute: 0.4 10*3/uL (ref 0.1–1.0)
Monocytes Relative: 8 %
NEUTROS PCT: 52 %
Neutro Abs: 2.3 10*3/uL (ref 1.7–7.7)
Platelets: 196 10*3/uL (ref 150–400)
RBC: 3.74 MIL/uL — AB (ref 3.87–5.11)
RDW: 13.9 % (ref 11.5–15.5)
WBC: 4.6 10*3/uL (ref 4.0–10.5)

## 2017-02-04 LAB — COMPREHENSIVE METABOLIC PANEL
ALBUMIN: 3.8 g/dL (ref 3.5–5.0)
ALK PHOS: 67 U/L (ref 38–126)
ALT: 12 U/L — AB (ref 14–54)
AST: 16 U/L (ref 15–41)
Anion gap: 7 (ref 5–15)
BUN: 31 mg/dL — AB (ref 6–20)
CALCIUM: 9.1 mg/dL (ref 8.9–10.3)
CO2: 29 mmol/L (ref 22–32)
CREATININE: 1.07 mg/dL — AB (ref 0.44–1.00)
Chloride: 101 mmol/L (ref 101–111)
GFR calc Af Amer: 51 mL/min — ABNORMAL LOW (ref >60)
GFR calc non Af Amer: 44 mL/min — ABNORMAL LOW (ref >60)
GLUCOSE: 84 mg/dL (ref 65–99)
Potassium: 3.8 mmol/L (ref 3.5–5.1)
Sodium: 137 mmol/L (ref 135–145)
Total Bilirubin: 0.6 mg/dL (ref 0.3–1.2)
Total Protein: 6.8 g/dL (ref 6.5–8.1)

## 2017-02-04 LAB — SEDIMENTATION RATE: Sed Rate: 17 mm/hr (ref 0–22)

## 2017-02-04 MED ORDER — HYDROCODONE-ACETAMINOPHEN 5-325 MG PO TABS: 1.0000 | ORAL_TABLET | Freq: Four times a day (QID) | ORAL | 0 refills | Status: DC | PRN

## 2017-02-04 MED ORDER — HYDROCODONE-ACETAMINOPHEN 5-325 MG PO TABS
1.0000 | ORAL_TABLET | Freq: Once | ORAL | Status: AC
  Administered 2017-02-04: 1 via ORAL
  Filled 2017-02-04: qty 1

## 2017-02-04 NOTE — ED Provider Notes (Signed)
[facility]-EMER[facility]Y DEPT Provider Note   CSN: [id number] Arrival date & time: 02/04/17  1322     History   Chief Complaint Chief Complaint  Patient presents with  . Otalgia    HPI Melanie Lowe is a 83 y.o. female.  Patient complains of a right sided earache and headache for a few days no fever no chills. Patient does not have URI symptoms   The history is provided by the patient. No language interpreter was used.  Headache   This is a new problem. The current episode started 12 to 24 hours ago. The problem occurs constantly. The problem has not changed since onset.The headache is associated with nothing. The pain is located in the right unilateral region. The quality of the pain is described as dull. The pain is at a severity of 5/10. The pain is moderate.    Past Medical History:  Diagnosis Date  . AAA (abdominal aortic aneurysm) (HCC)   . Anxiety   . Arthritis   . CHF (congestive heart failure) (HCC) 11/09/2015  . COPD (chronic obstructive pulmonary disease) (HCC)   . Depression   . Hypertension   . Leg swelling   . Myocardial infarction   . Neuropathy (HCC)   . Oxygen dependent   . Pneumonia   . Stroke [facility])     Patient Active Problem List   Diagnosis Date Noted  . CKD (chronic kidney disease) stage 3, GFR 30-59 ml/min 09/02/2016  . Diastolic dysfunction 09/02/2016  . Malnutrition of moderate degree 04/13/2016  . COPD exacerbation (HCC) 04/11/2016  . Anxiety 04/11/2016  . PNA (pneumonia) 04/11/2016  . Acute on chronic diastolic heart failure (HCC) 11/12/2015  . CHF (congestive heart failure) (HCC) 11/09/2015  . Acute on chronic respiratory failure (HCC) 11/05/2015  . Palliative care encounter   . DNR (do not resuscitate)   . Diverticulitis 11/02/2015  . Acute diverticulitis 11/02/2015  . AKI (acute kidney injury) (HCC) 11/02/2015  . Anemia 11/02/2015  . RLL pneumonia (HCC) 11/02/2015  . Pneumonia 04/12/2015  . Failure to thrive in adult 11/11/2014  . Acute  encephalopathy 11/11/2014  . Generalized weakness 11/11/2014  . Hypokalemia 11/11/2014  . Bradycardia 11/11/2014  . Dehydration 01/26/2014  . Orthostatic dizziness 01/24/2014  . SOB (shortness of breath) 01/24/2014  . Chronic respiratory failure with hypoxia (HCC) 01/24/2014  . Cough with hemoptysis 01/24/2014  . COPD (chronic obstructive pulmonary disease) (HCC) 01/24/2014  . Acute renal failure (HCC) 01/24/2014  . Dizzy 01/24/2014  . Acute-on-chronic respiratory failure (HCC) 10/12/2012  . Chest pain 10/10/2012  . CAP (community acquired pneumonia) 10/10/2012  . Aortic aneurysm (HCC) 04/16/2012  . Chest pain 08/01/2011  . HYPERCALCEMIA 06/16/2008  . ALLERGIC RHINITIS 12/05/2007  . WEIGHT GAIN 11/07/2007  . PAIN IN JOINT, LOWER LEG 10/09/2007  . TOBACCO ABUSE 09/29/2007  . Abdominal aortic aneurysm (HCC) 09/29/2007  . Sciatica 09/24/2007  . BACK PAIN 09/24/2007  . Anxiety disorder 11/28/2006  . DEPRESSION 11/28/2006  . CATARACT NOS 11/28/2006  . Essential hypertension 11/28/2006  . MYOCARDIAL INFARCTION, HX OF 11/28/2006  . COPD 11/28/2006  . OVERACTIVE BLADDER 11/28/2006  . UTI'S, RECURRENT 11/28/2006  . LOW BACK PAIN 11/28/2006  . URINARY INCONTINENCE 11/28/2006    Past Surgical History:  Procedure Laterality Date  . APPENDECTOMY    . CHOLECYSTECTOMY      OB History    Gravida Para Term Preterm AB Living   5 5 5          SAB TAB Ectopic  Multiple Live Births                   Home Medications    Prior to Admission medications   Medication Sig Start Date End Date Taking? Authorizing Provider  acetaminophen (TYLENOL) 500 MG tablet Take 250-500 mg by mouth daily as needed for mild pain or moderate pain. Reported on 04/11/2016   Yes Historical Provider, MD  albuterol (PROVENTIL) (2.5 MG/3ML) 0.083% nebulizer solution Take 2.5 mg by nebulization 4 (four) times daily.    Yes Historical Provider, MD  ALPRAZolam Prudy Feeler) 1 MG tablet Take 1 mg by mouth 3 (three) times  daily. 01/30/17  Yes Historical Provider, MD  aspirin EC 81 MG tablet Take 81 mg by mouth daily.   Yes Historical Provider, MD  budesonide-formoterol (SYMBICORT) 160-4.5 MCG/ACT inhaler Inhale 2 puffs into the lungs 2 (two) times daily.    Yes Historical Provider, MD  citalopram (CELEXA) 20 MG tablet Take 20 mg by mouth at bedtime.    Yes Historical Provider, MD  diazepam (VALIUM) 10 MG tablet Take 10 mg by mouth 3 (three) times daily as needed for anxiety.   Yes Historical Provider, MD  gabapentin (NEURONTIN) 100 MG capsule Take 100 mg by mouth 5 (five) times daily.  09/06/15  Yes Historical Provider, MD  isosorbide mononitrate (IMDUR) 30 MG 24 hr tablet Take 1 tablet (30 mg total) by mouth daily. 06/07/16  Yes Kari Baars, MD  oxyCODONE-acetaminophen (PERCOCET) 10-325 MG tablet Take 1 tablet by mouth every 6 (six) hours as needed for pain.   Yes Kari Baars, MD  tiotropium (SPIRIVA) 18 MCG inhalation capsule Place 18 mcg into inhaler and inhale every evening.    Yes Historical Provider, MD  torsemide (DEMADEX) 20 MG tablet Take 1 tablet (20 mg total) by mouth 2 (two) times daily. 11/12/15  Yes Kari Baars, MD  cephALEXin (KEFLEX) 500 MG capsule Take 1 capsule (500 mg total) by mouth 3 (three) times daily. Patient not taking: Reported on 02/04/2017 09/25/16   Blane Ohara, MD  HYDROcodone-acetaminophen (NORCO/VICODIN) 5-325 MG tablet Take 1 tablet by mouth every 6 (six) hours as needed for moderate pain. 02/04/17   [name], MD  nitroGLYCERIN (NITROSTAT) 0.4 MG SL tablet Place 0.4 mg under the tongue every 5 (five) minutes as needed for chest pain. 08/17/16   Historical Provider, MD  oseltamivir (TAMIFLU) 30 MG capsule Take 1 capsule (30 mg total) by mouth 2 (two) times daily. Patient not taking: Reported on 02/04/2017 01/19/17   [name], MD  predniSONE (DELTASONE) 20 MG tablet 2 tabs po daily x 3 days Patient not taking: Reported on 02/04/2017 10/10/16   [name], MD    Family  History Family History  Problem Relation Age of Onset  . Diabetes Brother   . Cancer Brother     Throat cancer, Heart attack  . Heart disease Brother   . Heart attack Brother   . Cancer Father   . Cancer Sister     Social History Social History  Substance Use Topics  . Smoking status: Former Smoker    Years: 50.00    Types: Cigarettes    Quit date: 12/31/2010  . Smokeless tobacco: Former Neurosurgeon    Quit date: 12/31/2010  . Alcohol use No     Allergies   Ciprofloxacin hcl; Nsaids; Other; Sulfur; and Tomato   Review of Systems Review of Systems  Constitutional: Negative for appetite change and fatigue.  HENT: Negative for congestion, ear discharge and  sinus pressure.   Eyes: Negative for discharge.  Respiratory: Negative for cough.   Cardiovascular: Negative for chest pain.  Gastrointestinal: Negative for abdominal pain and diarrhea.  Genitourinary: Negative for frequency and hematuria.  Musculoskeletal: Negative for back pain.  Skin: Negative for rash.  Neurological: Positive for headaches. Negative for seizures.  Psychiatric/Behavioral: Negative for hallucinations.     Physical Exam Updated Vital Signs BP 148/94 (BP Location: Left Arm)   Pulse 67   Temp 98.2 F (36.8 C) (Temporal)   Ht 5\' 1"  (1.549 m)   Wt 120 lb (54.4 kg)   SpO2 97%   BMI 22.67 kg/m   Physical Exam  Constitutional: She is oriented to person, place, and time. She appears well-developed.  HENT:  Head: Normocephalic.  Eyes: Conjunctivae and EOM are normal. No scleral icterus.  Neck: Neck supple. No thyromegaly present.  Cardiovascular: Normal rate and regular rhythm.  Exam reveals no gallop and no friction rub.   No murmur heard. Pulmonary/Chest: No stridor. She has no wheezes. She has no rales. She exhibits no tenderness.  Abdominal: She exhibits no distension. There is no tenderness. There is no rebound.  Musculoskeletal: Normal range of motion. She exhibits no edema.  Lymphadenopathy:     She has no cervical adenopathy.  Neurological: She is oriented to person, place, and time. She exhibits normal muscle tone. Coordination normal.  Skin: No rash noted. No erythema.  Psychiatric: She has a normal mood and affect. Her behavior is normal.     ED Treatments / Results  Labs (all labs ordered are listed, but only abnormal results are displayed) Labs Reviewed  CBC WITH DIFFERENTIAL/PLATELET - Abnormal; Notable for the following:       Result Value   RBC 3.74 (*)    Hemoglobin 11.7 (*)    HCT 34.7 (*)    All other components within normal limits  COMPREHENSIVE METABOLIC PANEL - Abnormal; Notable for the following:    BUN 31 (*)    Creatinine, Ser 1.07 (*)    ALT 12 (*)    GFR calc non Af Amer 44 (*)    GFR calc Af Amer 51 (*)    All other components within normal limits  SEDIMENTATION RATE    EKG  EKG Interpretation None       Radiology Ct Head Wo Contrast  Result Date: 02/04/2017 CLINICAL DATA:  83 year old female complaining of right ear pain extending to right-side of face and eye for the past 2 days. Denies injury. Initial encounter. EXAM: CT HEAD WITHOUT CONTRAST TECHNIQUE: Contiguous axial images were obtained from the base of the skull through the vertex without intravenous contrast. COMPARISON:  06/06/2016. FINDINGS: Brain: No intracranial hemorrhage or CT evidence of large acute infarct. Remote basal ganglia infarcts and chronic microvascular changes. Mild global atrophy without hydrocephalus. No intracranial mass lesion noted on this unenhanced exam. Vascular: Vascular calcifications. Skull: No acute abnormality Sinuses/Orbits: Visualized orbital structures unremarkable. Mastoid air cells, middle ear cavities and visualized paranasal sinuses are clear. Other: Negative. IMPRESSION: No intracranial hemorrhage or CT evidence of large acute infarct. Remote basal ganglia infarcts and chronic microvascular changes. Mild global atrophy. Electronically Signed   By:  Lacy Duverney M.D.   On: 02/04/2017 18:04    Procedures Procedures (including critical care time)  Medications Ordered in ED Medications  HYDROcodone-acetaminophen (NORCO/VICODIN) 5-325 MG per tablet 1 tablet (1 tablet Oral Given 02/04/17 1603)     Initial Impression / Assessment and Plan / ED Course  I  have reviewed the triage vital signs and the nursing notes.  Pertinent labs & imaging results that were available during my care of the patient were reviewed by me and considered in my medical decision making (see chart for details). Labs unremarkable,  CT the head was negative. Said what rate was normal. Patient will follow-up with primary care doctor     Final Clinical Impressions(s) / ED Diagnoses   Final diagnoses:  Bad headache    New Prescriptions New Prescriptions   HYDROCODONE-ACETAMINOPHEN (NORCO/VICODIN) 5-325 MG TABLET    Take 1 tablet by mouth every 6 (six) hours as needed for moderate pain.     [name], MD 02/04/17 [phone]

## 2017-02-04 NOTE — ED Triage Notes (Signed)
Patient complaining of right ear pain radiating into right side of face and eye x 2 days.

## 2017-02-04 NOTE — Discharge Instructions (Signed)
Follow-up with your doctor Wednesday as planned. Return if getting worse

## 2017-02-08 ENCOUNTER — Ambulatory Visit: Payer: Commercial Managed Care - HMO | Admitting: Licensed Clinical Social Worker

## 2017-02-10 ENCOUNTER — Emergency Department (HOSPITAL_COMMUNITY): Payer: Medicare HMO

## 2017-02-10 ENCOUNTER — Emergency Department (HOSPITAL_COMMUNITY)
Admission: EM | Admit: 2017-02-10 | Discharge: 2017-02-10 | Disposition: A | Discharge status: Home / Self Care | Payer: Medicare HMO | Attending: Emergency Medicine | Admitting: Emergency Medicine

## 2017-02-10 ENCOUNTER — Encounter (HOSPITAL_COMMUNITY): Payer: Self-pay

## 2017-02-10 DIAGNOSIS — Z79899 Other long term (current) drug therapy: Secondary | ICD-10-CM | POA: Diagnosis not present

## 2017-02-10 DIAGNOSIS — J449 Chronic obstructive pulmonary disease, unspecified: Secondary | ICD-10-CM | POA: Insufficient documentation

## 2017-02-10 DIAGNOSIS — H539 Unspecified visual disturbance: Secondary | ICD-10-CM | POA: Diagnosis not present

## 2017-02-10 DIAGNOSIS — I252 Old myocardial infarction: Secondary | ICD-10-CM | POA: Insufficient documentation

## 2017-02-10 DIAGNOSIS — I130 Hypertensive heart and chronic kidney disease with heart failure and stage 1 through stage 4 chronic kidney disease, or unspecified chronic kidney disease: Secondary | ICD-10-CM | POA: Insufficient documentation

## 2017-02-10 DIAGNOSIS — N183 Chronic kidney disease, stage 3 (moderate): Secondary | ICD-10-CM | POA: Insufficient documentation

## 2017-02-10 DIAGNOSIS — Z7982 Long term (current) use of aspirin: Secondary | ICD-10-CM | POA: Insufficient documentation

## 2017-02-10 DIAGNOSIS — I5033 Acute on chronic diastolic (congestive) heart failure: Secondary | ICD-10-CM | POA: Diagnosis not present

## 2017-02-10 DIAGNOSIS — R0789 Other chest pain: Secondary | ICD-10-CM | POA: Insufficient documentation

## 2017-02-10 DIAGNOSIS — R05 Cough: Secondary | ICD-10-CM | POA: Diagnosis not present

## 2017-02-10 DIAGNOSIS — Z87891 Personal history of nicotine dependence: Secondary | ICD-10-CM | POA: Insufficient documentation

## 2017-02-10 DIAGNOSIS — R079 Chest pain, unspecified: Secondary | ICD-10-CM | POA: Diagnosis not present

## 2017-02-10 LAB — BASIC METABOLIC PANEL
ANION GAP: 6 (ref 5–15)
BUN: 39 mg/dL — AB (ref 6–20)
CALCIUM: 9.1 mg/dL (ref 8.9–10.3)
CO2: 30 mmol/L (ref 22–32)
CREATININE: 1.46 mg/dL — AB (ref 0.44–1.00)
Chloride: 105 mmol/L (ref 101–111)
GFR calc Af Amer: 35 mL/min — ABNORMAL LOW (ref >60)
GFR calc non Af Amer: 30 mL/min — ABNORMAL LOW (ref >60)
GLUCOSE: 68 mg/dL (ref 65–99)
Potassium: 4 mmol/L (ref 3.5–5.1)
Sodium: 141 mmol/L (ref 135–145)

## 2017-02-10 LAB — CBC
HCT: 34.4 % — ABNORMAL LOW (ref 36.0–46.0)
HEMOGLOBIN: 11.4 g/dL — AB (ref 12.0–15.0)
MCH: 31 pg (ref 26.0–34.0)
MCHC: 33.1 g/dL (ref 30.0–36.0)
MCV: 93.5 fL (ref 78.0–100.0)
Platelets: 192 10*3/uL (ref 150–400)
RBC: 3.68 MIL/uL — ABNORMAL LOW (ref 3.87–5.11)
RDW: 14 % (ref 11.5–15.5)
WBC: 4.4 10*3/uL (ref 4.0–10.5)

## 2017-02-10 LAB — I-STAT TROPONIN, ED: TROPONIN I, POC: 0 ng/mL (ref 0.00–0.08)

## 2017-02-10 MED ORDER — TRAMADOL HCL 50 MG PO TABS
50.0000 mg | ORAL_TABLET | Freq: Once | ORAL | Status: AC
  Administered 2017-02-10: 50 mg via ORAL
  Filled 2017-02-10: qty 1

## 2017-02-10 MED ORDER — TRAMADOL HCL 50 MG PO TABS: 50.0000 mg | ORAL_TABLET | Freq: Four times a day (QID) | ORAL | 0 refills | Status: DC | PRN

## 2017-02-10 NOTE — ED Provider Notes (Signed)
[facility]Y DEPT Provider Note   CSN: [id number] Arrival date & time: 02/10/17  1817     History   Chief Complaint Chief Complaint  Patient presents with  . Chest Pain    HPI Melanie Lowe is a 83 y.o. female.  Patient with complaint of left lateral chest pain. Patient's had a productive cough. Also has COPD. No nausea no vomiting states he has had some dizziness. Patient normally on 3 L of oxygen at home all the time and has breathing treatments. Patient states symptoms started earlier today. Worse with coughing.      Past Medical History:  Diagnosis Date  . AAA (abdominal aortic aneurysm) (HCC)   . Anxiety   . Arthritis   . CHF (congestive heart failure) (HCC) 11/09/2015  . COPD (chronic obstructive pulmonary disease) (HCC)   . Depression   . Hypertension   . Leg swelling   . Myocardial infarction   . Neuropathy (HCC)   . Oxygen dependent   . Pneumonia   . Stroke [facility])     Patient Active Problem List   Diagnosis Date Noted  . CKD (chronic kidney disease) stage 3, GFR 30-59 ml/min 09/02/2016  . Diastolic dysfunction 09/02/2016  . Malnutrition of moderate degree 04/13/2016  . COPD exacerbation (HCC) 04/11/2016  . Anxiety 04/11/2016  . PNA (pneumonia) 04/11/2016  . Acute on chronic diastolic heart failure (HCC) 11/12/2015  . CHF (congestive heart failure) (HCC) 11/09/2015  . Acute on chronic respiratory failure (HCC) 11/05/2015  . Palliative care encounter   . DNR (do not resuscitate)   . Diverticulitis 11/02/2015  . Acute diverticulitis 11/02/2015  . AKI (acute kidney injury) (HCC) 11/02/2015  . Anemia 11/02/2015  . RLL pneumonia (HCC) 11/02/2015  . Pneumonia 04/12/2015  . Failure to thrive in adult 11/11/2014  . Acute encephalopathy 11/11/2014  . Generalized weakness 11/11/2014  . Hypokalemia 11/11/2014  . Bradycardia 11/11/2014  . Dehydration 01/26/2014  . Orthostatic dizziness 01/24/2014  . SOB (shortness of breath) 01/24/2014  . Chronic  respiratory failure with hypoxia (HCC) 01/24/2014  . Cough with hemoptysis 01/24/2014  . COPD (chronic obstructive pulmonary disease) (HCC) 01/24/2014  . Acute renal failure (HCC) 01/24/2014  . Dizzy 01/24/2014  . Acute-on-chronic respiratory failure (HCC) 10/12/2012  . Chest pain 10/10/2012  . CAP (community acquired pneumonia) 10/10/2012  . Aortic aneurysm (HCC) 04/16/2012  . Chest pain 08/01/2011  . HYPERCALCEMIA 06/16/2008  . ALLERGIC RHINITIS 12/05/2007  . WEIGHT GAIN 11/07/2007  . PAIN IN JOINT, LOWER LEG 10/09/2007  . TOBACCO ABUSE 09/29/2007  . Abdominal aortic aneurysm (HCC) 09/29/2007  . Sciatica 09/24/2007  . BACK PAIN 09/24/2007  . Anxiety disorder 11/28/2006  . DEPRESSION 11/28/2006  . CATARACT NOS 11/28/2006  . Essential hypertension 11/28/2006  . MYOCARDIAL INFARCTION, HX OF 11/28/2006  . COPD 11/28/2006  . OVERACTIVE BLADDER 11/28/2006  . UTI'S, RECURRENT 11/28/2006  . LOW BACK PAIN 11/28/2006  . URINARY INCONTINENCE 11/28/2006    Past Surgical History:  Procedure Laterality Date  . APPENDECTOMY    . CHOLECYSTECTOMY      OB History    Gravida Para Term Preterm AB Living   5 5 5          SAB TAB Ectopic Multiple Live Births                   Home Medications    Prior to Admission medications   Medication Sig Start Date End Date Taking? Authorizing Provider  ALPRAZolam Prudy Feeler) 1 MG tablet  Take 1 mg by mouth 3 (three) times daily. 01/30/17  Yes Historical Provider, MD  aspirin EC 81 MG tablet Take 81 mg by mouth daily.   Yes Historical Provider, MD  budesonide-formoterol (SYMBICORT) 160-4.5 MCG/ACT inhaler Inhale 2 puffs into the lungs 2 (two) times daily.    Yes Historical Provider, MD  citalopram (CELEXA) 20 MG tablet Take 20 mg by mouth at bedtime.    Yes Historical Provider, MD  isosorbide mononitrate (IMDUR) 30 MG 24 hr tablet Take 1 tablet (30 mg total) by mouth daily. 06/07/16  Yes Kari Baars, MD  nitroGLYCERIN (NITROSTAT) 0.4 MG SL tablet  Place 0.4 mg under the tongue every 5 (five) minutes as needed for chest pain. 08/17/16  Yes Historical Provider, MD  oxyCODONE-acetaminophen (PERCOCET) 10-325 MG tablet Take 1 tablet by mouth every 6 (six) hours as needed for pain.   Yes Kari Baars, MD  tiotropium (SPIRIVA) 18 MCG inhalation capsule Place 18 mcg into inhaler and inhale every evening.    Yes Historical Provider, MD  torsemide (DEMADEX) 20 MG tablet Take 1 tablet (20 mg total) by mouth 2 (two) times daily. 11/12/15  Yes Kari Baars, MD  acetaminophen (TYLENOL) 500 MG tablet Take 250-500 mg by mouth daily as needed for mild pain or moderate pain. Reported on 04/11/2016    Historical Provider, MD  albuterol (PROVENTIL) (2.5 MG/3ML) 0.083% nebulizer solution Take 2.5 mg by nebulization 4 (four) times daily.     Historical Provider, MD  gabapentin (NEURONTIN) 100 MG capsule Take 100 mg by mouth 5 (five) times daily.  09/06/15   Historical Provider, MD  oseltamivir (TAMIFLU) 30 MG capsule Take 1 capsule (30 mg total) by mouth 2 (two) times daily. Patient not taking: Reported on 02/04/2017 01/19/17   [name], MD  traMADol (ULTRAM) 50 MG tablet Take 1 tablet (50 mg total) by mouth every 6 (six) hours as needed. 02/10/17   [name], MD    Family History Family History  Problem Relation Age of Onset  . Diabetes Brother   . Cancer Brother     Throat cancer, Heart attack  . Heart disease Brother   . Heart attack Brother   . Cancer Father   . Cancer Sister     Social History Social History  Substance Use Topics  . Smoking status: Former Smoker    Years: 50.00    Types: Cigarettes    Quit date: 12/31/2010  . Smokeless tobacco: Former Neurosurgeon    Quit date: 12/31/2010  . Alcohol use No     Allergies   Ciprofloxacin hcl; Nsaids; Other; Sulfur; and Tomato   Review of Systems Review of Systems  Constitutional: Negative for fever.  HENT: Negative for congestion.   Eyes: Positive for visual disturbance.  Respiratory:  Negative for shortness of breath.   Cardiovascular: Positive for chest pain.  Gastrointestinal: Negative for abdominal pain.  Genitourinary: Negative for dysuria.  Musculoskeletal: Negative for back pain.  Skin: Negative for rash.  Neurological: Negative for headaches.  Hematological: Does not bruise/bleed easily.  Psychiatric/Behavioral: Negative for confusion.     Physical Exam Updated Vital Signs BP 123/77   Pulse (!) 54   Temp 97.6 F (36.4 C)   Resp 16   Ht 5\' 1"  (1.549 m)   Wt 54.4 kg   SpO2 98%   BMI 22.67 kg/m   Physical Exam  Constitutional: She is oriented to person, place, and time. She appears well-developed and well-nourished. No distress.  HENT:  Head: Normocephalic  and atraumatic.  Mouth/Throat: Oropharynx is clear and moist.  Eyes: EOM are normal. Pupils are equal, round, and reactive to light.  Neck: Normal range of motion. Neck supple.  Cardiovascular: Normal rate, regular rhythm and normal heart sounds.   Pulmonary/Chest: Effort normal and breath sounds normal. She exhibits tenderness.  A tender to palpation left lateral lower chest wall. No crepitance. Abdomen nontender.  Abdominal: Soft. Bowel sounds are normal. There is no tenderness.  Musculoskeletal: Normal range of motion.  Neurological: She is alert and oriented to person, place, and time. No cranial nerve deficit or sensory deficit. She exhibits normal muscle tone. Coordination normal.  Skin: Skin is warm.  Nursing note and vitals reviewed.    ED Treatments / Results  Labs (all labs ordered are listed, but only abnormal results are displayed) Labs Reviewed  BASIC METABOLIC PANEL - Abnormal; Notable for the following:       Result Value   BUN 39 (*)    Creatinine, Ser 1.46 (*)    GFR calc non Af Amer 30 (*)    GFR calc Af Amer 35 (*)    All other components within normal limits  CBC - Abnormal; Notable for the following:    RBC 3.68 (*)    Hemoglobin 11.4 (*)    HCT 34.4 (*)    All  other components within normal limits  I-STAT TROPOININ, ED    EKG  EKG Interpretation  Date/Time:  Sunday February 10 2017 18:18:45 EST Ventricular Rate:  66 PR Interval:    QRS Duration: 101 QT Interval:  422 QTC Calculation: 443 R Axis:   29 Text Interpretation:  Sinus rhythm Prolonged PR interval No significant change since last tracing Confirmed by Anniah Glick  MD, Jermarcus Mcfadyen [id number]) on 02/10/2017 7:19:25 PM       Radiology Dg Chest 2 View  Result Date: 02/10/2017 CLINICAL DATA:  Acute pain in the left chest.  Productive cough. EXAM: CHEST  2 VIEW COMPARISON:  01/27/2017 FINDINGS: Chronic left ventricular prominence. Chronic aortic atherosclerosis with unfolded aorta. Chronic scarring and both lower lungs. No sign of active infiltrate or edema. No collapse. Chronic lingular nodule is stable. Mild chronic blunting of the posterior costophrenic angles. No acute bone finding is seen. IMPRESSION: Left ventricular prominence. Aortic atherosclerosis. Basilar pulmonary scarring. Chronic lingular nodule. No active finding. Electronically Signed   By: Paulina Fusi M.D.   On: 02/10/2017 18:52    Procedures Procedures (including critical care time)  Medications Ordered in ED Medications  traMADol (ULTRAM) tablet 50 mg (50 mg Oral Given 02/10/17 2025)     Initial Impression / Assessment and Plan / ED Course  I have reviewed the triage vital signs and the nursing notes.  Pertinent labs & imaging results that were available during my care of the patient were reviewed by me and considered in my medical decision making (see chart for details).    Patient's pain left lateral lower chest wall consistent with chest wall pain. Troponin negative chest x-ray negative for pneumothorax or pneumonia. No hypoxia. Patient normally on 3 L at home on 2 L here and satting in the upper 90s. Patient nontoxic no acute distress. Labs without significant changes. Patient treated with tramadol with some improvement  in the pain. Will be continued on that. Patient has primary care doctor for follow-up. EKG without acute changes.   Final Clinical Impressions(s) / ED Diagnoses   Final diagnoses:  Chest wall pain    New Prescriptions New Prescriptions  TRAMADOL (ULTRAM) 50 MG TABLET    Take 1 tablet (50 mg total) by mouth every 6 (six) hours as needed.     [name], MD 02/10/17 2036

## 2017-02-10 NOTE — ED Notes (Signed)
Trop result 0.00 did not cross from I stat

## 2017-02-10 NOTE — ED Notes (Signed)
Lab to room 

## 2017-02-10 NOTE — ED Triage Notes (Signed)
Pt brought in by EMS. Pt reports pain started suddenly in left lateral rib area. States she was not doing anything when pain began. Complained of feeling lightheaded. Reports a productive cough for 2-3 days

## 2017-02-10 NOTE — ED Notes (Signed)
Delay in discharge afer son request to speka with physician regarding pt and the tramadol rx

## 2017-02-10 NOTE — ED Notes (Signed)
Dr Z in to assess 

## 2017-02-10 NOTE — ED Notes (Signed)
Reports three day hx of cough with yellow expectorant- former smoker- lives with son- sudden onset of L sided CP without N/V but complaint of dizziness

## 2017-02-10 NOTE — Discharge Instructions (Signed)
Workup for the chest wall pain without any acute findings. Take the tramadol as needed for pain. Return for any new or worse symptoms. Make an appointment follow-up with your doctor for recheck.

## 2017-02-10 NOTE — ED Notes (Signed)
Dr Z in to discuss and reassess 

## 2017-02-10 NOTE — ED Notes (Signed)
Call to lab re need labs drawn

## 2017-02-11 DIAGNOSIS — R0902 Hypoxemia: Secondary | ICD-10-CM | POA: Diagnosis not present

## 2017-02-13 ENCOUNTER — Other Ambulatory Visit: Payer: Self-pay

## 2017-02-13 NOTE — Patient Outreach (Signed)
[facility]) Care Management  02/13/2017  CLARABEL RASKIN 30-Nov-1924 [id number]   Telephone call to patient for ED utilization screen.  Female answers stating patient is not available.  Health Coach information given for return call.    Plan: RN Health Coach will attempt patient again within 10 business days.    Bary Leriche, RN, MSN [facility] Care Management RN Telephonic Health Coach [phone]

## 2017-02-19 ENCOUNTER — Other Ambulatory Visit: Payer: Self-pay

## 2017-02-19 NOTE — Patient Outreach (Signed)
[facility]) Care Management  02/19/2017  Melanie Lowe 05-15-1924 [id number]   2nd telephone call to patient for high ED utilization screen.  Female answers stating that patient is not in right now. Health Coach contact information given for return call.    Plan: RN health Coach will attempt patient again within  10 business days.    Bary Leriche, RN, MSN [facility] Care Management RN Telephonic Health Coach [phone]

## 2017-02-25 ENCOUNTER — Other Ambulatory Visit: Payer: Self-pay

## 2017-02-25 NOTE — Patient Outreach (Signed)
[facility]) Care Management  02/25/2017  Melanie Lowe [birth year] [id number]   Telephone call to patient for ED Utilization screen.  Spoke with caregiver [name].  Discussed [facility] Care Management and need for involvement again.  She states that patient is actually doing well and that she recently had ED visits due to virus and has bounced back.  She declines the need for [facility] Care Management involvement at this time. Advised on [facility] Care Management contact information for future reference.  She verbalized understanding.  Plan: RN Health Coach will send letter and brochure for future reference.  Will notify care management assistant of case status.    Bary Leriche, RN, MSN [facility] Care Management RN Telephonic Health Coach [phone]

## 2017-03-06 ENCOUNTER — Emergency Department (HOSPITAL_COMMUNITY)
Admission: EM | Admit: 2017-03-06 | Discharge: 2017-03-07 | Disposition: A | Discharge status: Home / Self Care | Payer: Medicare HMO | Attending: Emergency Medicine | Admitting: Emergency Medicine

## 2017-03-06 ENCOUNTER — Emergency Department (HOSPITAL_COMMUNITY): Payer: Medicare HMO

## 2017-03-06 ENCOUNTER — Encounter (HOSPITAL_COMMUNITY): Payer: Self-pay | Admitting: *Deleted

## 2017-03-06 DIAGNOSIS — Z87891 Personal history of nicotine dependence: Secondary | ICD-10-CM | POA: Insufficient documentation

## 2017-03-06 DIAGNOSIS — M25511 Pain in right shoulder: Secondary | ICD-10-CM | POA: Insufficient documentation

## 2017-03-06 DIAGNOSIS — Z79899 Other long term (current) drug therapy: Secondary | ICD-10-CM | POA: Diagnosis not present

## 2017-03-06 DIAGNOSIS — N183 Chronic kidney disease, stage 3 (moderate): Secondary | ICD-10-CM | POA: Insufficient documentation

## 2017-03-06 DIAGNOSIS — Z9981 Dependence on supplemental oxygen: Secondary | ICD-10-CM | POA: Diagnosis not present

## 2017-03-06 DIAGNOSIS — I5033 Acute on chronic diastolic (congestive) heart failure: Secondary | ICD-10-CM | POA: Diagnosis not present

## 2017-03-06 DIAGNOSIS — J449 Chronic obstructive pulmonary disease, unspecified: Secondary | ICD-10-CM | POA: Diagnosis not present

## 2017-03-06 DIAGNOSIS — Z7982 Long term (current) use of aspirin: Secondary | ICD-10-CM | POA: Diagnosis not present

## 2017-03-06 DIAGNOSIS — R42 Dizziness and giddiness: Secondary | ICD-10-CM | POA: Diagnosis not present

## 2017-03-06 DIAGNOSIS — M79601 Pain in right arm: Secondary | ICD-10-CM | POA: Diagnosis not present

## 2017-03-06 DIAGNOSIS — I252 Old myocardial infarction: Secondary | ICD-10-CM | POA: Diagnosis not present

## 2017-03-06 DIAGNOSIS — I639 Cerebral infarction, unspecified: Secondary | ICD-10-CM | POA: Diagnosis not present

## 2017-03-06 DIAGNOSIS — I130 Hypertensive heart and chronic kidney disease with heart failure and stage 1 through stage 4 chronic kidney disease, or unspecified chronic kidney disease: Secondary | ICD-10-CM | POA: Insufficient documentation

## 2017-03-06 DIAGNOSIS — R52 Pain, unspecified: Secondary | ICD-10-CM | POA: Diagnosis not present

## 2017-03-06 LAB — APTT: aPTT: 38 seconds — ABNORMAL HIGH (ref 24–36)

## 2017-03-06 LAB — DIFFERENTIAL
BASOS PCT: 0 %
Basophils Absolute: 0 10*3/uL (ref 0.0–0.1)
EOS ABS: 0.1 10*3/uL (ref 0.0–0.7)
EOS PCT: 3 %
Lymphocytes Relative: 30 %
Lymphs Abs: 1.5 10*3/uL (ref 0.7–4.0)
MONO ABS: 0.5 10*3/uL (ref 0.1–1.0)
Monocytes Relative: 11 %
Neutro Abs: 2.8 10*3/uL (ref 1.7–7.7)
Neutrophils Relative %: 56 %

## 2017-03-06 LAB — PROTIME-INR
INR: 1.17
PROTHROMBIN TIME: 14.9 s (ref 11.4–15.2)

## 2017-03-06 LAB — COMPREHENSIVE METABOLIC PANEL
ALT: 11 U/L — ABNORMAL LOW (ref 14–54)
ANION GAP: 7 (ref 5–15)
AST: 20 U/L (ref 15–41)
Albumin: 3.8 g/dL (ref 3.5–5.0)
Alkaline Phosphatase: 63 U/L (ref 38–126)
BUN: 35 mg/dL — ABNORMAL HIGH (ref 6–20)
CHLORIDE: 98 mmol/L — AB (ref 101–111)
CO2: 27 mmol/L (ref 22–32)
Calcium: 8.9 mg/dL (ref 8.9–10.3)
Creatinine, Ser: 1.3 mg/dL — ABNORMAL HIGH (ref 0.44–1.00)
GFR calc non Af Amer: 35 mL/min — ABNORMAL LOW (ref >60)
GFR, EST AFRICAN AMERICAN: 40 mL/min — AB (ref >60)
Glucose, Bld: 77 mg/dL (ref 65–99)
POTASSIUM: 4.1 mmol/L (ref 3.5–5.1)
SODIUM: 132 mmol/L — AB (ref 135–145)
Total Bilirubin: 0.5 mg/dL (ref 0.3–1.2)
Total Protein: 7.1 g/dL (ref 6.5–8.1)

## 2017-03-06 LAB — URINALYSIS, ROUTINE W REFLEX MICROSCOPIC
BACTERIA UA: NONE SEEN
BILIRUBIN URINE: NEGATIVE
Glucose, UA: NEGATIVE mg/dL
Ketones, ur: NEGATIVE mg/dL
LEUKOCYTES UA: NEGATIVE
NITRITE: NEGATIVE
Protein, ur: NEGATIVE mg/dL
Specific Gravity, Urine: 1.008 (ref 1.005–1.030)
pH: 6 (ref 5.0–8.0)

## 2017-03-06 LAB — TROPONIN I

## 2017-03-06 LAB — CBC
HCT: 35.9 % — ABNORMAL LOW (ref 36.0–46.0)
Hemoglobin: 12 g/dL (ref 12.0–15.0)
MCH: 31.1 pg (ref 26.0–34.0)
MCHC: 33.4 g/dL (ref 30.0–36.0)
MCV: 93 fL (ref 78.0–100.0)
PLATELETS: 195 10*3/uL (ref 150–400)
RBC: 3.86 MIL/uL — ABNORMAL LOW (ref 3.87–5.11)
RDW: 13.9 % (ref 11.5–15.5)
WBC: 5 10*3/uL (ref 4.0–10.5)

## 2017-03-06 MED ORDER — TRAMADOL HCL 50 MG PO TABS
50.0000 mg | ORAL_TABLET | Freq: Once | ORAL | Status: AC
  Administered 2017-03-06: 50 mg via ORAL
  Filled 2017-03-06: qty 1

## 2017-03-06 NOTE — ED Notes (Addendum)
Family called, chasity, stated to call her when pt is d/c. [phone]

## 2017-03-06 NOTE — ED Provider Notes (Signed)
[facility] DEPT Provider Note   CSN: [id number] Arrival date & time: 03/06/17  1652     History   Chief Complaint No chief complaint on file.   HPI Melanie Lowe is a 83 y.o. female.  HPI Patient with multiple medical problems and inoperable large abdominal aortic aneurysm presents with right shoulder pain that started yesterday at 10 AM. Associated with visual changes and dizziness described as spinning. She states the pain radiates to her right neck and right side of her face.No known trauma or recent falls. Patient is not able to ambulate without assistance at baseline. She is cared for by her family at home. Denies any focal weakness or numbness. Has history of chronic dizziness. She is on chronic supplemental oxygen at 3 L. Denies chest pain or abdominal pain. Past Medical History:  Diagnosis Date  . AAA (abdominal aortic aneurysm) (HCC)   . Anxiety   . Arthritis   . CHF (congestive heart failure) (HCC) 11/09/2015  . COPD (chronic obstructive pulmonary disease) (HCC)   . Depression   . Hypertension   . Leg swelling   . Myocardial infarction   . Neuropathy (HCC)   . Oxygen dependent   . Pneumonia   . Stroke [facility])     Patient Active Problem List   Diagnosis Date Noted  . CKD (chronic kidney disease) stage 3, GFR 30-59 ml/min 09/02/2016  . Diastolic dysfunction 09/02/2016  . Malnutrition of moderate degree 04/13/2016  . COPD exacerbation (HCC) 04/11/2016  . Anxiety 04/11/2016  . PNA (pneumonia) 04/11/2016  . Acute on chronic diastolic heart failure (HCC) 11/12/2015  . CHF (congestive heart failure) (HCC) 11/09/2015  . Acute on chronic respiratory failure (HCC) 11/05/2015  . Palliative care encounter   . DNR (do not resuscitate)   . Diverticulitis 11/02/2015  . Acute diverticulitis 11/02/2015  . AKI (acute kidney injury) (HCC) 11/02/2015  . Anemia 11/02/2015  . RLL pneumonia (HCC) 11/02/2015  . Pneumonia 04/12/2015  . Failure to thrive in adult 11/11/2014  .  Acute encephalopathy 11/11/2014  . Generalized weakness 11/11/2014  . Hypokalemia 11/11/2014  . Bradycardia 11/11/2014  . Dehydration 01/26/2014  . Orthostatic dizziness 01/24/2014  . SOB (shortness of breath) 01/24/2014  . Chronic respiratory failure with hypoxia (HCC) 01/24/2014  . Cough with hemoptysis 01/24/2014  . COPD (chronic obstructive pulmonary disease) (HCC) 01/24/2014  . Acute renal failure (HCC) 01/24/2014  . Dizzy 01/24/2014  . Acute-on-chronic respiratory failure (HCC) 10/12/2012  . Chest pain 10/10/2012  . CAP (community acquired pneumonia) 10/10/2012  . Aortic aneurysm (HCC) 04/16/2012  . Chest pain 08/01/2011  . HYPERCALCEMIA 06/16/2008  . ALLERGIC RHINITIS 12/05/2007  . WEIGHT GAIN 11/07/2007  . PAIN IN JOINT, LOWER LEG 10/09/2007  . TOBACCO ABUSE 09/29/2007  . Abdominal aortic aneurysm (HCC) 09/29/2007  . Sciatica 09/24/2007  . BACK PAIN 09/24/2007  . Anxiety disorder 11/28/2006  . DEPRESSION 11/28/2006  . CATARACT NOS 11/28/2006  . Essential hypertension 11/28/2006  . MYOCARDIAL INFARCTION, HX OF 11/28/2006  . COPD 11/28/2006  . OVERACTIVE BLADDER 11/28/2006  . UTI'S, RECURRENT 11/28/2006  . LOW BACK PAIN 11/28/2006  . URINARY INCONTINENCE 11/28/2006    Past Surgical History:  Procedure Laterality Date  . APPENDECTOMY    . CHOLECYSTECTOMY      OB History    Gravida Para Term Preterm AB Living   5 5 5          SAB TAB Ectopic Multiple Live Births  Home Medications    Prior to Admission medications   Medication Sig Start Date End Date Taking? Authorizing Provider  acetaminophen (TYLENOL) 500 MG tablet Take 250-500 mg by mouth every 6 (six) hours as needed for mild pain, moderate pain, fever or headache.    Yes Historical Provider, MD  albuterol (PROVENTIL) (2.5 MG/3ML) 0.083% nebulizer solution Take 2.5 mg by nebulization every 6 (six) hours as needed for wheezing or shortness of breath.    Yes Historical Provider, MD    ALPRAZolam Prudy Feeler) 1 MG tablet Take 1 mg by mouth 3 (three) times daily.   Yes Historical Provider, MD  aspirin EC 81 MG tablet Take 81 mg by mouth daily.   Yes Historical Provider, MD  budesonide-formoterol (SYMBICORT) 160-4.5 MCG/ACT inhaler Inhale 2 puffs into the lungs 2 (two) times daily.    Yes Historical Provider, MD  citalopram (CELEXA) 20 MG tablet Take 20 mg by mouth at bedtime.    Yes Historical Provider, MD  gabapentin (NEURONTIN) 100 MG capsule Take 100 mg by mouth 5 (five) times daily.    Yes Historical Provider, MD  isosorbide mononitrate (IMDUR) 30 MG 24 hr tablet Take 1 tablet (30 mg total) by mouth daily. 06/07/16  Yes Kari Baars, MD  nitroGLYCERIN (NITROSTAT) 0.4 MG SL tablet Place 0.4 mg under the tongue every 5 (five) minutes as needed for chest pain. 08/17/16  Yes Historical Provider, MD  oxyCODONE-acetaminophen (PERCOCET) 10-325 MG tablet Take 1 tablet by mouth every 6 (six) hours as needed for pain.    Yes Kari Baars, MD  tiotropium (SPIRIVA) 18 MCG inhalation capsule Place 18 mcg into inhaler and inhale at bedtime.    Yes Historical Provider, MD  torsemide (DEMADEX) 20 MG tablet Take 1 tablet (20 mg total) by mouth 2 (two) times daily. 11/12/15  Yes Kari Baars, MD  traMADol (ULTRAM) 50 MG tablet Take 1 tablet (50 mg total) by mouth every 6 (six) hours as needed. Patient taking differently: Take 50 mg by mouth every 6 (six) hours as needed for moderate pain.  02/10/17  Yes [name], MD    Family History Family History  Problem Relation Age of Onset  . Diabetes Brother   . Cancer Brother     Throat cancer, Heart attack  . Heart disease Brother   . Heart attack Brother   . Cancer Father   . Cancer Sister     Social History Social History  Substance Use Topics  . Smoking status: Former Smoker    Years: 50.00    Types: Cigarettes    Quit date: 12/31/2010  . Smokeless tobacco: Former Neurosurgeon    Quit date: 12/31/2010  . Alcohol use No     Allergies    Ciprofloxacin hcl; Nsaids; Other; Sulfur; and Tomato   Review of Systems Review of Systems  Constitutional: Negative for chills and fever.  HENT: Negative for facial swelling and sore throat.   Eyes: Negative for visual disturbance.  Respiratory: Negative for cough and shortness of breath.   Cardiovascular: Negative for chest pain.  Gastrointestinal: Negative for abdominal pain, constipation, diarrhea and nausea.  Genitourinary: Negative for difficulty urinating, dysuria, flank pain and pelvic pain.  Musculoskeletal: Positive for arthralgias, gait problem and neck pain. Negative for neck stiffness.  Skin: Negative for rash and wound.  Neurological: Positive for dizziness, light-headedness and headaches. Negative for speech difficulty, weakness and numbness.  All other systems reviewed and are negative.    Physical Exam Updated Vital Signs BP 125/80 (BP  Location: Left Arm)   Pulse (!) 56   Temp 98.2 F (36.8 C)   Resp 17   Ht 5\' 1"  (1.549 m)   Wt 120 lb (54.4 kg)   SpO2 100%   BMI 22.67 kg/m   Physical Exam  Constitutional: She is oriented to person, place, and time. She appears well-developed and well-nourished.  HENT:  Head: Normocephalic and atraumatic.  Mouth/Throat: Oropharynx is clear and moist.  Eyes: EOM are normal. Pupils are equal, round, and reactive to light.  Neck: Normal range of motion. Neck supple.  No meningismus  Cardiovascular: Normal rate and regular rhythm.   Pulmonary/Chest: Effort normal and breath sounds normal.  Abdominal: Soft. Bowel sounds are normal. There is no tenderness. There is no rebound and no guarding.  Musculoskeletal: She exhibits tenderness. She exhibits no edema.  Patient has tenderness to palpation over the lateral right deltoid. Limited range of motion due to pain. No obvious bony deformity. 2+ radial pulses bilaterally.  Neurological: She is alert and oriented to person, place, and time.  4/5 grip strength bilaterally. 5/5  bilateral lower extremities. Sensation is grossly intact. Patient is able to ambulate with some assistance.  Skin: Skin is warm and dry. Capillary refill takes less than 2 seconds. No rash noted. No erythema.  Psychiatric: She has a normal mood and affect. Her behavior is normal.  Nursing note and vitals reviewed.    ED Treatments / Results  Labs (all labs ordered are listed, but only abnormal results are displayed) Labs Reviewed  APTT - Abnormal; Notable for the following:       Result Value   aPTT 38 (*)    All other components within normal limits  CBC - Abnormal; Notable for the following:    RBC 3.86 (*)    HCT 35.9 (*)    All other components within normal limits  COMPREHENSIVE METABOLIC PANEL - Abnormal; Notable for the following:    Sodium 132 (*)    Chloride 98 (*)    BUN 35 (*)    Creatinine, Ser 1.30 (*)    ALT 11 (*)    GFR calc non Af Amer 35 (*)    GFR calc Af Amer 40 (*)    All other components within normal limits  URINALYSIS, ROUTINE W REFLEX MICROSCOPIC - Abnormal; Notable for the following:    Hgb urine dipstick MODERATE (*)    All other components within normal limits  PROTIME-INR  DIFFERENTIAL  TROPONIN I    EKG  EKG Interpretation None       Radiology Dg Shoulder Right  Result Date: 03/06/2017 CLINICAL DATA:  83 y/o  F; right shoulder pain.  No known injury. EXAM: RIGHT SHOULDER - 2+ VIEW COMPARISON:  None. FINDINGS: No acute fracture or dislocation. Mild glenohumeral osteoarthrosis with productive changes of the inferior humeral head and glenoid rim. Diminished acromial humeral distance compatible with rotator cuff tear. Acromioclavicular joint is well maintained. IMPRESSION: 1. No acute fracture or dislocation. 2. Mild glenohumeral osteoarthrosis with productive changes of the inferior humeral head and glenoid rim. 3. Diminished acromial humeral distance compatible with rotator cuff tear. Electronically Signed   By: Mitzi Hansen M.D.    On: 03/06/2017 21:41   Ct Head Wo Contrast  Result Date: 03/06/2017 CLINICAL DATA:  Right shoulder pain x2 days EXAM: CT HEAD WITHOUT CONTRAST CT CERVICAL SPINE WITHOUT CONTRAST TECHNIQUE: Multidetector CT imaging of the head and cervical spine was performed following the standard protocol without intravenous contrast. Multiplanar  CT image reconstructions of the cervical spine were also generated. COMPARISON:  Head CT from 02/04/2017 FINDINGS: CT HEAD FINDINGS BRAIN: There is sulcal and ventricular prominence consistent with superficial and central atrophy. No intraparenchymal hemorrhage, mass effect nor midline shift. Stable chronic right-sided basal ganglial lacunar infarcts. Periventricular and subcortical white matter hypodensities consistent with chronic small vessel ischemic disease are identified. No acute large vascular territory infarcts. No abnormal extra-axial fluid collections. Basal cisterns are not effaced and midline. VASCULAR: Moderate calcific atherosclerosis of the carotid siphons. SKULL: No skull fracture. No significant scalp soft tissue swelling. SINUSES/ORBITS: The mastoid air-cells are clear. The included paranasal sinuses are well-aerated.The included ocular globes and orbital contents are non-suspicious. OTHER: None. CT CERVICAL SPINE FINDINGS Alignment: Slight reversal cervical lordosis secondary to degenerative disc space narrowing with apex at C5. Craniocervical relationship is maintained. Atlantodental interval is normal. Skull base and vertebrae: No acute fracture. Rounded sclerotic focus involving the left pedicle of C2, in isolation and without history of malignancy likely represents a small bone island. Soft tissues and spinal canal: No prevertebral fluid or swelling. No visible canal hematoma. Disc levels: Disc space narrowing at C5-6 and C6-7 with small posterior marginal osteophytes and uncovertebral osteoarthritis contributing to bilateral neural foraminal encroachment  mild-to-moderate. Upper chest: Minimal scarring and/or atelectasis in the right upper lobe. Other: Negative IMPRESSION: 1. No acute intracranial abnormality. Chronic right basal ganglial lacunar infarcts with chronic small vessel ischemic disease of periventricular and subcortical white matter. Mild age related involutional changes. 2. Degenerative disc disease at C5-6 and C6-7 with posterior osteophytes and uncovertebral spurring contributing to bilateral neural foraminal encroachment mild-to-moderate. 3. No acute osseous abnormality. Electronically Signed   By: Tollie Eth M.D.   On: 03/06/2017 20:30   Ct Cervical Spine Wo Contrast  Result Date: 03/06/2017 CLINICAL DATA:  Right shoulder pain x2 days EXAM: CT HEAD WITHOUT CONTRAST CT CERVICAL SPINE WITHOUT CONTRAST TECHNIQUE: Multidetector CT imaging of the head and cervical spine was performed following the standard protocol without intravenous contrast. Multiplanar CT image reconstructions of the cervical spine were also generated. COMPARISON:  Head CT from 02/04/2017 FINDINGS: CT HEAD FINDINGS BRAIN: There is sulcal and ventricular prominence consistent with superficial and central atrophy. No intraparenchymal hemorrhage, mass effect nor midline shift. Stable chronic right-sided basal ganglial lacunar infarcts. Periventricular and subcortical white matter hypodensities consistent with chronic small vessel ischemic disease are identified. No acute large vascular territory infarcts. No abnormal extra-axial fluid collections. Basal cisterns are not effaced and midline. VASCULAR: Moderate calcific atherosclerosis of the carotid siphons. SKULL: No skull fracture. No significant scalp soft tissue swelling. SINUSES/ORBITS: The mastoid air-cells are clear. The included paranasal sinuses are well-aerated.The included ocular globes and orbital contents are non-suspicious. OTHER: None. CT CERVICAL SPINE FINDINGS Alignment: Slight reversal cervical lordosis secondary to  degenerative disc space narrowing with apex at C5. Craniocervical relationship is maintained. Atlantodental interval is normal. Skull base and vertebrae: No acute fracture. Rounded sclerotic focus involving the left pedicle of C2, in isolation and without history of malignancy likely represents a small bone island. Soft tissues and spinal canal: No prevertebral fluid or swelling. No visible canal hematoma. Disc levels: Disc space narrowing at C5-6 and C6-7 with small posterior marginal osteophytes and uncovertebral osteoarthritis contributing to bilateral neural foraminal encroachment mild-to-moderate. Upper chest: Minimal scarring and/or atelectasis in the right upper lobe. Other: Negative IMPRESSION: 1. No acute intracranial abnormality. Chronic right basal ganglial lacunar infarcts with chronic small vessel ischemic disease of periventricular and subcortical white matter.  Mild age related involutional changes. 2. Degenerative disc disease at C5-6 and C6-7 with posterior osteophytes and uncovertebral spurring contributing to bilateral neural foraminal encroachment mild-to-moderate. 3. No acute osseous abnormality. Electronically Signed   By: Tollie Eth M.D.   On: 03/06/2017 20:30    Procedures Procedures (including critical care time)  Medications Ordered in ED Medications  traMADol (ULTRAM) tablet 50 mg (50 mg Oral Given 03/06/17 2228)     Initial Impression / Assessment and Plan / ED Course  I have reviewed the triage vital signs and the nursing notes.  Pertinent labs & imaging results that were available during my care of the patient were reviewed by me and considered in my medical decision making (see chart for details).    Workup with evidence of cervical spine degenerative disease and questionable right rotator cuff abnormality. Placed in sling for comfort. Patient is eating and tolerating well. No acute abdomen now these are discovered. Patient's symptoms mostly appeared to be chronic in  nature. She does not believe that inpatient treatment is justified at this point. Will place home health order for assessment of needs caring for the patient at home.   Final Clinical Impressions(s) / ED Diagnoses   Final diagnoses:  Acute pain of right shoulder  Dizziness  Dependence on continuous supplemental oxygen    New Prescriptions New Prescriptions   No medications on file     Loren Racer, MD 03/07/17 [phone]

## 2017-03-06 NOTE — ED Triage Notes (Signed)
Pt brought in by [facility] with c/o right shoulder pain x 2 days. Pt also c/o right ear drainage. BP 120/75 for EMS.

## 2017-03-06 NOTE — ED Notes (Signed)
Ambulated pt to hallway portable O2 tank. Pt complained of dizziness and unsteadiness and asked to be returned to bed. Pt also stated that she does not ambulate at home except from the bedroom to the bathroom.

## 2017-03-07 DIAGNOSIS — Z743 Need for continuous supervision: Secondary | ICD-10-CM | POA: Diagnosis not present

## 2017-03-07 DIAGNOSIS — R279 Unspecified lack of coordination: Secondary | ICD-10-CM | POA: Diagnosis not present

## 2017-03-07 NOTE — ED Notes (Signed)
Pt verbalizes understanding of payment for ambulance transportation ride home. Pt verbalizes discharge instructions and there is no signs of distress. Awaiting ambulance transportation.

## 2017-03-11 DIAGNOSIS — R0902 Hypoxemia: Secondary | ICD-10-CM | POA: Diagnosis not present

## 2017-03-31 DIAGNOSIS — J449 Chronic obstructive pulmonary disease, unspecified: Secondary | ICD-10-CM | POA: Diagnosis not present

## 2017-04-11 DIAGNOSIS — R0902 Hypoxemia: Secondary | ICD-10-CM | POA: Diagnosis not present

## 2017-04-30 DIAGNOSIS — J449 Chronic obstructive pulmonary disease, unspecified: Secondary | ICD-10-CM | POA: Diagnosis not present

## 2017-05-02 DIAGNOSIS — J449 Chronic obstructive pulmonary disease, unspecified: Secondary | ICD-10-CM | POA: Diagnosis not present

## 2017-05-02 DIAGNOSIS — I25119 Atherosclerotic heart disease of native coronary artery with unspecified angina pectoris: Secondary | ICD-10-CM | POA: Diagnosis not present

## 2017-05-02 DIAGNOSIS — F321 Major depressive disorder, single episode, moderate: Secondary | ICD-10-CM | POA: Diagnosis not present

## 2017-05-02 DIAGNOSIS — M545 Low back pain: Secondary | ICD-10-CM | POA: Diagnosis not present

## 2017-05-10 ENCOUNTER — Emergency Department (HOSPITAL_COMMUNITY): Payer: Medicare HMO

## 2017-05-10 ENCOUNTER — Emergency Department (HOSPITAL_COMMUNITY)
Admission: EM | Admit: 2017-05-10 | Discharge: 2017-05-10 | Disposition: A | Discharge status: Home / Self Care | Payer: Medicare HMO | Attending: Emergency Medicine | Admitting: Emergency Medicine

## 2017-05-10 ENCOUNTER — Encounter (HOSPITAL_COMMUNITY): Payer: Self-pay | Admitting: Emergency Medicine

## 2017-05-10 DIAGNOSIS — J441 Chronic obstructive pulmonary disease with (acute) exacerbation: Secondary | ICD-10-CM | POA: Diagnosis not present

## 2017-05-10 DIAGNOSIS — I509 Heart failure, unspecified: Secondary | ICD-10-CM | POA: Diagnosis not present

## 2017-05-10 DIAGNOSIS — I130 Hypertensive heart and chronic kidney disease with heart failure and stage 1 through stage 4 chronic kidney disease, or unspecified chronic kidney disease: Secondary | ICD-10-CM | POA: Insufficient documentation

## 2017-05-10 DIAGNOSIS — Z79899 Other long term (current) drug therapy: Secondary | ICD-10-CM | POA: Diagnosis not present

## 2017-05-10 DIAGNOSIS — Z87891 Personal history of nicotine dependence: Secondary | ICD-10-CM | POA: Insufficient documentation

## 2017-05-10 DIAGNOSIS — Z7982 Long term (current) use of aspirin: Secondary | ICD-10-CM | POA: Insufficient documentation

## 2017-05-10 DIAGNOSIS — I714 Abdominal aortic aneurysm, without rupture: Secondary | ICD-10-CM | POA: Diagnosis not present

## 2017-05-10 DIAGNOSIS — N183 Chronic kidney disease, stage 3 (moderate): Secondary | ICD-10-CM | POA: Diagnosis not present

## 2017-05-10 DIAGNOSIS — R0602 Shortness of breath: Secondary | ICD-10-CM | POA: Diagnosis not present

## 2017-05-10 DIAGNOSIS — R079 Chest pain, unspecified: Secondary | ICD-10-CM | POA: Diagnosis not present

## 2017-05-10 LAB — COMPREHENSIVE METABOLIC PANEL
ALBUMIN: 3.9 g/dL (ref 3.5–5.0)
ALK PHOS: 69 U/L (ref 38–126)
ALT: 11 U/L — ABNORMAL LOW (ref 14–54)
AST: 17 U/L (ref 15–41)
Anion gap: 5 (ref 5–15)
BILIRUBIN TOTAL: 0.4 mg/dL (ref 0.3–1.2)
BUN: 27 mg/dL — AB (ref 6–20)
CALCIUM: 9.6 mg/dL (ref 8.9–10.3)
CO2: 31 mmol/L (ref 22–32)
Chloride: 104 mmol/L (ref 101–111)
Creatinine, Ser: 1.15 mg/dL — ABNORMAL HIGH (ref 0.44–1.00)
GFR calc Af Amer: 47 mL/min — ABNORMAL LOW (ref >60)
GFR, EST NON AFRICAN AMERICAN: 40 mL/min — AB (ref >60)
GLUCOSE: 71 mg/dL (ref 65–99)
POTASSIUM: 4.4 mmol/L (ref 3.5–5.1)
Sodium: 140 mmol/L (ref 135–145)
TOTAL PROTEIN: 7.1 g/dL (ref 6.5–8.1)

## 2017-05-10 LAB — I-STAT CHEM 8, ED
BUN: 27 mg/dL — AB (ref 6–20)
CREATININE: 1.3 mg/dL — AB (ref 0.44–1.00)
Calcium, Ion: 1.23 mmol/L (ref 1.15–1.40)
Chloride: 102 mmol/L (ref 101–111)
GLUCOSE: 71 mg/dL (ref 65–99)
HEMATOCRIT: 37 % (ref 36.0–46.0)
HEMOGLOBIN: 12.6 g/dL (ref 12.0–15.0)
POTASSIUM: 4.2 mmol/L (ref 3.5–5.1)
Sodium: 141 mmol/L (ref 135–145)
TCO2: 31 mmol/L (ref 0–100)

## 2017-05-10 LAB — CBC WITH DIFFERENTIAL/PLATELET
Basophils Absolute: 0 10*3/uL (ref 0.0–0.1)
Basophils Relative: 0 %
Eosinophils Absolute: 0.2 10*3/uL (ref 0.0–0.7)
Eosinophils Relative: 4 %
HEMATOCRIT: 37.2 % (ref 36.0–46.0)
HEMOGLOBIN: 12.3 g/dL (ref 12.0–15.0)
LYMPHS ABS: 2 10*3/uL (ref 0.7–4.0)
LYMPHS PCT: 38 %
MCH: 31.5 pg (ref 26.0–34.0)
MCHC: 33.1 g/dL (ref 30.0–36.0)
MCV: 95.4 fL (ref 78.0–100.0)
MONOS PCT: 10 %
Monocytes Absolute: 0.5 10*3/uL (ref 0.1–1.0)
NEUTROS PCT: 48 %
Neutro Abs: 2.5 10*3/uL (ref 1.7–7.7)
Platelets: 223 10*3/uL (ref 150–400)
RBC: 3.9 MIL/uL (ref 3.87–5.11)
RDW: 13.4 % (ref 11.5–15.5)
WBC: 5.3 10*3/uL (ref 4.0–10.5)

## 2017-05-10 LAB — LIPASE, BLOOD: LIPASE: 24 U/L (ref 11–51)

## 2017-05-10 LAB — I-STAT TROPONIN, ED
Troponin i, poc: 0.01 ng/mL (ref 0.00–0.08)
Troponin i, poc: 0.01 ng/mL (ref 0.00–0.08)

## 2017-05-10 MED ORDER — MORPHINE SULFATE (PF) 2 MG/ML IV SOLN
2.0000 mg | Freq: Once | INTRAVENOUS | Status: AC
  Administered 2017-05-10: 2 mg via INTRAVENOUS
  Filled 2017-05-10: qty 1

## 2017-05-10 MED ORDER — IOPAMIDOL (ISOVUE-370) INJECTION 76%
80.0000 mL | Freq: Once | INTRAVENOUS | Status: AC | PRN
  Administered 2017-05-10: 80 mL via INTRAVENOUS

## 2017-05-10 NOTE — ED Provider Notes (Signed)
[facility] DEPT Provider Note   CSN: [id number] Arrival date & time: 05/10/17  1717     History   Chief Complaint Chief Complaint  Patient presents with  . Shortness of Breath    HPI Melanie Lowe is a 83 y.o. female.  Patient had some shortness of breath today she used her inhalers and later started having some chest discomfort. She is no longer hurting now. Patient has a large abdominal aneurysm which they are not going to operate on and she also has a thoracic aortic aneurysm   The history is provided by the patient.  Shortness of Breath  This is a recurrent problem. The problem occurs frequently.The current episode started less than 1 hour ago. The problem has been resolved. Pertinent negatives include no fever, no headaches, no cough, no chest pain, no abdominal pain and no rash.    Past Medical History:  Diagnosis Date  . AAA (abdominal aortic aneurysm) (HCC)   . Anxiety   . Arthritis   . CHF (congestive heart failure) (HCC) 11/09/2015  . COPD (chronic obstructive pulmonary disease) (HCC)   . Depression   . Hypertension   . Leg swelling   . Myocardial infarction (HCC)   . Neuropathy   . Oxygen dependent   . Pneumonia   . Stroke [facility])     Patient Active Problem List   Diagnosis Date Noted  . CKD (chronic kidney disease) stage 3, GFR 30-59 ml/min 09/02/2016  . Diastolic dysfunction 09/02/2016  . Malnutrition of moderate degree 04/13/2016  . COPD exacerbation (HCC) 04/11/2016  . Anxiety 04/11/2016  . PNA (pneumonia) 04/11/2016  . Acute on chronic diastolic heart failure (HCC) 11/12/2015  . CHF (congestive heart failure) (HCC) 11/09/2015  . Acute on chronic respiratory failure (HCC) 11/05/2015  . Palliative care encounter   . DNR (do not resuscitate)   . Diverticulitis 11/02/2015  . Acute diverticulitis 11/02/2015  . AKI (acute kidney injury) (HCC) 11/02/2015  . Anemia 11/02/2015  . RLL pneumonia (HCC) 11/02/2015  . Pneumonia 04/12/2015  . Failure to  thrive in adult 11/11/2014  . Acute encephalopathy 11/11/2014  . Generalized weakness 11/11/2014  . Hypokalemia 11/11/2014  . Bradycardia 11/11/2014  . Dehydration 01/26/2014  . Orthostatic dizziness 01/24/2014  . SOB (shortness of breath) 01/24/2014  . Chronic respiratory failure with hypoxia (HCC) 01/24/2014  . Cough with hemoptysis 01/24/2014  . COPD (chronic obstructive pulmonary disease) (HCC) 01/24/2014  . Acute renal failure (HCC) 01/24/2014  . Dizzy 01/24/2014  . Acute-on-chronic respiratory failure (HCC) 10/12/2012  . Chest pain 10/10/2012  . CAP (community acquired pneumonia) 10/10/2012  . Aortic aneurysm (HCC) 04/16/2012  . Chest pain 08/01/2011  . HYPERCALCEMIA 06/16/2008  . ALLERGIC RHINITIS 12/05/2007  . WEIGHT GAIN 11/07/2007  . PAIN IN JOINT, LOWER LEG 10/09/2007  . TOBACCO ABUSE 09/29/2007  . Abdominal aortic aneurysm (HCC) 09/29/2007  . Sciatica 09/24/2007  . BACK PAIN 09/24/2007  . Anxiety disorder 11/28/2006  . DEPRESSION 11/28/2006  . CATARACT NOS 11/28/2006  . Essential hypertension 11/28/2006  . MYOCARDIAL INFARCTION, HX OF 11/28/2006  . COPD 11/28/2006  . OVERACTIVE BLADDER 11/28/2006  . UTI'S, RECURRENT 11/28/2006  . LOW BACK PAIN 11/28/2006  . URINARY INCONTINENCE 11/28/2006    Past Surgical History:  Procedure Laterality Date  . APPENDECTOMY    . CHOLECYSTECTOMY      OB History    Gravida Para Term Preterm AB Living   5 5 5          SAB TAB  Ectopic Multiple Live Births                   Home Medications    Prior to Admission medications   Medication Sig Start Date End Date Taking? Authorizing Provider  albuterol (PROVENTIL) (2.5 MG/3ML) 0.083% nebulizer solution Take 2.5 mg by nebulization every 6 (six) hours as needed for wheezing or shortness of breath.    Yes [provider]  ALPRAZolam Prudy Feeler) 1 MG tablet Take 1 mg by mouth 3 (three) times daily.   Yes [provider]  aspirin EC 81 MG tablet Take 81 mg by  mouth daily.   Yes [provider]  budesonide-formoterol (SYMBICORT) 160-4.5 MCG/ACT inhaler Inhale 2 puffs into the lungs 2 (two) times daily.    Yes [provider]  citalopram (CELEXA) 20 MG tablet Take 20 mg by mouth at bedtime.    Yes [provider]  gabapentin (NEURONTIN) 100 MG capsule Take 100 mg by mouth 5 (five) times daily.    Yes [provider]  isosorbide mononitrate (IMDUR) 30 MG 24 hr tablet Take 1 tablet (30 mg total) by mouth daily. 06/07/16  Yes Kari Baars, MD  nitroGLYCERIN (NITROSTAT) 0.4 MG SL tablet Place 0.4 mg under the tongue every 5 (five) minutes as needed for chest pain. 08/17/16  Yes [provider]  oxyCODONE-acetaminophen (PERCOCET) 10-325 MG tablet Take 1 tablet by mouth every 6 (six) hours as needed for pain.    Yes Kari Baars, MD  tiotropium (SPIRIVA) 18 MCG inhalation capsule Place 18 mcg into inhaler and inhale at bedtime.    Yes [provider]  torsemide (DEMADEX) 20 MG tablet Take 1 tablet (20 mg total) by mouth 2 (two) times daily. 11/12/15  Yes Kari Baars, MD  traMADol (ULTRAM) 50 MG tablet Take 1 tablet (50 mg total) by mouth every 6 (six) hours as needed. Patient taking differently: Take 50 mg by mouth every 6 (six) hours as needed for moderate pain.  02/10/17  Yes [name], MD  acetaminophen (TYLENOL) 500 MG tablet Take 250-500 mg by mouth every 6 (six) hours as needed for mild pain, moderate pain, fever or headache.     [provider]    Family History Family History  Problem Relation Age of Onset  . Diabetes Brother   . Cancer Brother        Throat cancer, Heart attack  . Heart disease Brother   . Heart attack Brother   . Cancer Father   . Cancer Sister     Social History Social History  Substance Use Topics  . Smoking status: Former Smoker    Years: 50.00    Types: Cigarettes    Quit date: 12/31/2010  . Smokeless tobacco: Former Neurosurgeon    Quit date:  12/31/2010  . Alcohol use No     Allergies   Ciprofloxacin hcl; Nsaids; Other; Sulfa antibiotics; Sulfur; and Tomato   Review of Systems Review of Systems  Constitutional: Negative for appetite change, fatigue and fever.  HENT: Negative for congestion, ear discharge and sinus pressure.   Eyes: Negative for discharge.  Respiratory: Positive for shortness of breath. Negative for cough.   Cardiovascular: Negative for chest pain.  Gastrointestinal: Negative for abdominal pain and diarrhea.  Genitourinary: Negative for frequency and hematuria.  Musculoskeletal: Negative for back pain.  Skin: Negative for rash.  Neurological: Negative for seizures and headaches.  Psychiatric/Behavioral: Negative for hallucinations.     Physical Exam Updated Vital Signs BP  131/86   Pulse (!) 49   Temp 98 F (36.7 C) (Oral)   Resp 12   Ht 5\' 1"  (1.549 m)   Wt 127 lb (57.6 kg)   SpO2 96%   BMI 24.00 kg/m   Physical Exam  Constitutional: She is oriented to person, place, and time. She appears well-developed.  HENT:  Head: Normocephalic.  Eyes: Conjunctivae and EOM are normal. No scleral icterus.  Neck: Neck supple. No thyromegaly present.  Cardiovascular: Normal rate and regular rhythm.  Exam reveals no gallop and no friction rub.   No murmur heard. Pulmonary/Chest: No stridor. She has no wheezes. She has no rales. She exhibits no tenderness.  Abdominal: She exhibits no distension. There is no tenderness. There is no rebound.  Musculoskeletal: Normal range of motion. She exhibits no edema.  Lymphadenopathy:    She has no cervical adenopathy.  Neurological: She is oriented to person, place, and time. She exhibits normal muscle tone. Coordination normal.  Skin: No rash noted. No erythema.  Psychiatric: She has a normal mood and affect. Her behavior is normal.     ED Treatments / Results  Labs (all labs ordered are listed, but only abnormal results are displayed) Labs Reviewed    COMPREHENSIVE METABOLIC PANEL - Abnormal; Notable for the following:       Result Value   BUN 27 (*)    Creatinine, Ser 1.15 (*)    ALT 11 (*)    GFR calc non Af Amer 40 (*)    GFR calc Af Amer 47 (*)    All other components within normal limits  I-STAT CHEM 8, ED - Abnormal; Notable for the following:    BUN 27 (*)    Creatinine, Ser 1.30 (*)    All other components within normal limits  CBC WITH DIFFERENTIAL/PLATELET  LIPASE, BLOOD  I-STAT TROPOININ, ED  I-STAT TROPOININ, ED    EKG  EKG Interpretation None       Radiology Dg Chest Portable 1 View  Result Date: 05/10/2017 CLINICAL DATA:  Chest pain and shortness of breath. EXAM: PORTABLE CHEST 1 VIEW COMPARISON:  Frontal and lateral views 02/10/2017 FINDINGS: Stable heart size and mediastinal contours are tortuous atherosclerotic thoracic aorta. Streaky bibasilar atelectasis or scarring. Emphysematous changes are noted. No consolidation, pulmonary edema, pleural fluid or pneumothorax. Stable osseous structures. IMPRESSION: 1. No acute abnormality. Emphysema with bibasilar atelectasis or scarring. 2. Stable aortic tortuosity and atherosclerosis. Electronically Signed   By: Rubye Oaks M.D.   On: 05/10/2017 18:04   Ct Angio Chest/abd/pel For Dissection W And/or Wo Contrast  Result Date: 05/10/2017 CLINICAL DATA:  Known abdominal aortic aneurysm. Abdominal pain. Chest pain and shortness of breath a few hours prior to admission. Diarrhea lower extremity swelling. EXAM: CT ANGIOGRAPHY CHEST, ABDOMEN AND PELVIS TECHNIQUE: Multidetector CT imaging through the chest, abdomen and pelvis was performed using the standard protocol during bolus administration of intravenous contrast. Multiplanar reconstructed images and MIPs were obtained and reviewed to evaluate the vascular anatomy. CONTRAST:  80 mL Isovue 370 IV. COMPARISON:  Chest CT 07/01/2015 and 11/22/2014 as well as abdominal CT 12/12/2015 FINDINGS: CTA CHEST FINDINGS  Cardiovascular: There is cardiomegaly unchanged. No pericardial effusion. Calcified plaque over the left anterior descending, lateral circumflex and right coronary arteries. There is aneurysmal dilatation of the ascending thoracic aorta measuring 4.6 cm in AP diameter (previously 4.4 cm). Moderate calcified plaque over the thoracic aorta. Over the aortic arch, just distal to the takeoff of the left subclavian artery  is a focal penetrating ulcer versus pseudoaneurysm along the left inferolateral margin slightly worse. No evidence of dissection. Mediastinum/Nodes: 1 cm precarinal lymph node. No hilar adenopathy. Remaining mediastinal structures are unremarkable. Lungs/Pleura: Lungs are well inflated with mild linear scarring over the lingula. No focal airspace consolidation or effusion. Suggestion of minimal aspirate material along the right side of the trachea. Musculoskeletal: Curvature of the thoracic spine convex right with mild degenerative changes of the spine. Review of the MIP images confirms the above findings. CTA ABDOMEN AND PELVIS FINDINGS VASCULAR Aorta: Evidence patient's known abdominal aortic aneurysm beginning just after the takeoff of the superior mesenteric artery. There is moderate mural thrombus as the aneurysm has enlarged compared to the prior exam from 12/12/2015 and measures 8.3 x 8.3 cm in AP and transverse dimension (previously 7.5 x 7.4 cm). The aneurysm measures 9.2 cm in craniocaudal dimension. There is no evidence of extravasation or leak. There appears mild thrombus within the aorta just distal to the aneurysm. The aorta is moderately tortuous extending left of midline unchanged. Celiac: Patent. SMA: Calcified plaque at the origin and otherwise patent. Renals: Calcified plaque at the origin of the right renal artery which is otherwise patent. Calcified plaque at the origin of the left renal artery which is otherwise patent and courses along the superior surface of the aneurysm. IMA:  Patent. Inflow: Moderate calcified plaque over the iliac arteries as the common iliac, external iliac and internal iliac arteries are otherwise patent without occlusion or significant focal stenosis. Visualized femoral arteries are patent. Veins: Unremarkable. Review of the MIP images confirms the above findings. NON-VASCULAR Hepatobiliary: Prior cholecystectomy. Liver is within normal. Mild prominence of the common bowel duct likely due to the post cholecystectomy state. Pancreas: Within normal. Spleen: Within normal. Adrenals/Urinary Tract: Adrenal glands unremarkable. Kidneys are normal in size. The left kidney is displaced laterally by at the abdominal aortic aneurysm. No evidence of hydronephrosis or nephrolithiasis. Ureters and bladder are within normal. Stomach/Bowel: Stomach and small bowel are unremarkable. Appendix is not visualized. There is diverticulosis of the colon which is otherwise within normal. Lymphatic: No evidence of adenopathy. Reproductive: Within normal. Other: No free fluid or focal inflammatory change. Musculoskeletal: Moderate degenerative changes of the spine with multilevel disc disease within the lumbar spine. Subtle grade 1 retrolisthesis of L1 on L2 and mild grade 1 anterolisthesis of L3 on L4 unchanged. Moderate facet arthropathy. Mild biphasic curvature of the spine unchanged. Mild degenerate change of the hips. Review of the MIP images confirms the above findings. IMPRESSION: Moderate tortuosity of the abdominal aorta extending left of midline with evidence of known abdominal aortic aneurysm which has increased in size now measuring 8.3 x 8.3 cm (previously 7.5 x 7.4 cm). Aneurysm begins just after the takeoff of the superior mesenteric artery extends 9.2 cm craniocaudal dimensions. No evidence of extravasation/leak. Mild thrombus within the aorta just distal to the aneurysm. Aneurysm displaces the left kidney laterally. Aneurysmal dilatation of the ascending thoracic aorta  measuring 4.6 cm in AP diameter (previously 4.4 cm). Ascending thoracic aortic aneurysm. Recommend semi-annual imaging followup by CTA or MRA and referral to cardiothoracic surgery if not already obtained. This recommendation follows 2010 ACCF/AHA/AATS/ACR/ASA/SCA/SCAI/SIR/STS/SVM Guidelines for the Diagnosis and Management of Patients With Thoracic Aortic Disease. Circulation. 2010; 121: H631-S970. Focal penetrating aortic also versus pseudoaneurysm along the left inferolateral border of the aortic arch just distal to the takeoff of the left subclavian artery slightly worse. Cardiomegaly with 3 vessel atherosclerotic coronary artery disease. Previous cholecystectomy with  mild prominence of the common bile duct. These results were called by telephone at the time of interpretation on 05/10/2017 at 8:48 pm to Dr. [name] , who verbally acknowledged these results. Electronically Signed   By: Elberta Fortis M.D.   On: 05/10/2017 20:48    Procedures Procedures (including critical care time)  Medications Ordered in ED Medications  morphine 2 MG/ML injection 2 mg (2 mg Intravenous Given 05/10/17 1815)  iopamidol (ISOVUE-370) 76 % injection 80 mL (80 mLs Intravenous Contrast Given 05/10/17 1922)     Initial Impression / Assessment and Plan / ED Course  I have reviewed the triage vital signs and the nursing notes.  Pertinent labs & imaging results that were available during my care of the patient were reviewed by me and considered in my medical decision making (see chart for details).     Patient with chest pain and shortness of breath that has resolved. CT angiogram shows her thoracic and abdominal aneurysm. they are not leaking.patient had 2 negative troponins. Patient no longer has chest discomfort. She will be discharged home follow-up with her family doctor.  Final Clinical Impressions(s) / ED Diagnoses   Final diagnoses:  COPD exacerbation (HCC)    New Prescriptions New Prescriptions    No medications on file     [name], MD 05/10/17 2236

## 2017-05-10 NOTE — ED Triage Notes (Signed)
Pt reports chest pain and shortness of breath starting a few hours prior to arrival.  Also diarrhea and swelling in legs.  Pt last used neb at 1200.

## 2017-05-10 NOTE — Discharge Instructions (Signed)
Follow up with your md next week. °

## 2017-05-11 DIAGNOSIS — R0902 Hypoxemia: Secondary | ICD-10-CM | POA: Diagnosis not present

## 2017-05-13 ENCOUNTER — Other Ambulatory Visit: Payer: Self-pay

## 2017-05-13 NOTE — Patient Outreach (Signed)
[facility]) Care Management  05/13/2017  Melanie Lowe 1924/03/07 [id number]   Telephone call to caregiver/son Evlyn Kanner.  He is able to verify HIPAA.  Discussed with him [facility].  He states that he feels they are doing good in managing her care and has declined.  He is receptive to receiving information on [facility] again.    Plan: RN Health Coach will send letter and brochure. RN Health Coach will notify care management assistant of case status.    Bary Leriche, RN, MSN [facility] Care Management RN Telephonic Health Coach [phone]

## 2017-05-16 DIAGNOSIS — I714 Abdominal aortic aneurysm, without rupture: Secondary | ICD-10-CM | POA: Diagnosis not present

## 2017-05-16 DIAGNOSIS — I10 Essential (primary) hypertension: Secondary | ICD-10-CM | POA: Diagnosis not present

## 2017-05-16 DIAGNOSIS — J449 Chronic obstructive pulmonary disease, unspecified: Secondary | ICD-10-CM | POA: Diagnosis not present

## 2017-05-16 DIAGNOSIS — M545 Low back pain: Secondary | ICD-10-CM | POA: Diagnosis not present

## 2017-05-21 ENCOUNTER — Emergency Department (HOSPITAL_COMMUNITY): Payer: Medicare HMO

## 2017-05-21 ENCOUNTER — Emergency Department (HOSPITAL_COMMUNITY)
Admission: EM | Admit: 2017-05-21 | Discharge: 2017-05-21 | Disposition: A | Discharge status: Home / Self Care | Payer: Medicare HMO | Attending: Emergency Medicine | Admitting: Emergency Medicine

## 2017-05-21 ENCOUNTER — Encounter (HOSPITAL_COMMUNITY): Payer: Self-pay | Admitting: Emergency Medicine

## 2017-05-21 DIAGNOSIS — Z79899 Other long term (current) drug therapy: Secondary | ICD-10-CM | POA: Insufficient documentation

## 2017-05-21 DIAGNOSIS — I130 Hypertensive heart and chronic kidney disease with heart failure and stage 1 through stage 4 chronic kidney disease, or unspecified chronic kidney disease: Secondary | ICD-10-CM | POA: Insufficient documentation

## 2017-05-21 DIAGNOSIS — N183 Chronic kidney disease, stage 3 (moderate): Secondary | ICD-10-CM | POA: Diagnosis not present

## 2017-05-21 DIAGNOSIS — Y999 Unspecified external cause status: Secondary | ICD-10-CM | POA: Insufficient documentation

## 2017-05-21 DIAGNOSIS — Z87891 Personal history of nicotine dependence: Secondary | ICD-10-CM | POA: Insufficient documentation

## 2017-05-21 DIAGNOSIS — W0110XA Fall on same level from slipping, tripping and stumbling with subsequent striking against unspecified object, initial encounter: Secondary | ICD-10-CM | POA: Insufficient documentation

## 2017-05-21 DIAGNOSIS — I509 Heart failure, unspecified: Secondary | ICD-10-CM | POA: Diagnosis not present

## 2017-05-21 DIAGNOSIS — Y92009 Unspecified place in unspecified non-institutional (private) residence as the place of occurrence of the external cause: Secondary | ICD-10-CM | POA: Diagnosis not present

## 2017-05-21 DIAGNOSIS — J449 Chronic obstructive pulmonary disease, unspecified: Secondary | ICD-10-CM | POA: Diagnosis not present

## 2017-05-21 DIAGNOSIS — Y9301 Activity, walking, marching and hiking: Secondary | ICD-10-CM | POA: Diagnosis not present

## 2017-05-21 DIAGNOSIS — W19XXXA Unspecified fall, initial encounter: Secondary | ICD-10-CM

## 2017-05-21 DIAGNOSIS — Z7982 Long term (current) use of aspirin: Secondary | ICD-10-CM | POA: Diagnosis not present

## 2017-05-21 DIAGNOSIS — S0083XA Contusion of other part of head, initial encounter: Secondary | ICD-10-CM | POA: Diagnosis not present

## 2017-05-21 DIAGNOSIS — S0990XA Unspecified injury of head, initial encounter: Secondary | ICD-10-CM | POA: Diagnosis not present

## 2017-05-21 NOTE — ED Notes (Signed)
Pt wheeled to waiting room with 3L [place]. Pt verbalized understanding of discharge instructions.

## 2017-05-21 NOTE — Discharge Instructions (Signed)
Tylenol or ibuprofen as needed for pain.  Return to the emergency department for worsening headache, difficulty waking, or other new and concerning symptoms.

## 2017-05-21 NOTE — ED Triage Notes (Signed)
Patient states she tripped at home and hit her head. Denies LOC, blurred vision. NAD.

## 2017-05-21 NOTE — ED Provider Notes (Signed)
[facility]-EMERGENCY DEPT Provider Note   CSN: [id number] Arrival date & time: 05/21/17  1826     History   Chief Complaint Chief Complaint  Patient presents with  . Fall    HPI Melanie Lowe is a 83 y.o. female.  Patient is a 83 year old female with past medical history of CHF, COPD, AAA, coronary artery disease. She presents today after a fall. She reports she was walking in her house to meet the [employer] people when she tripped in her hallway, fell forward, hit her forehead on the floor. She denies any loss of consciousness, headache, or neck pain. She denies any visual disturbances. He denies any other injury in the fall. She was brought by her son for evaluation.   The history is provided by the patient.  Fall  This is a new problem. The current episode started 1 to 2 hours ago. The problem occurs constantly. The problem has not changed since onset.Pertinent negatives include no headaches. Nothing aggravates the symptoms. Nothing relieves the symptoms. She has tried nothing for the symptoms. The treatment provided no relief.    Past Medical History:  Diagnosis Date  . AAA (abdominal aortic aneurysm) (HCC)   . Anxiety   . Arthritis   . CHF (congestive heart failure) (HCC) 11/09/2015  . COPD (chronic obstructive pulmonary disease) (HCC)   . Depression   . Hypertension   . Leg swelling   . Myocardial infarction (HCC)   . Neuropathy   . Oxygen dependent   . Pneumonia   . Stroke [facility])     Patient Active Problem List   Diagnosis Date Noted  . CKD (chronic kidney disease) stage 3, GFR 30-59 ml/min 09/02/2016  . Diastolic dysfunction 09/02/2016  . Malnutrition of moderate degree 04/13/2016  . COPD exacerbation (HCC) 04/11/2016  . Anxiety 04/11/2016  . PNA (pneumonia) 04/11/2016  . Acute on chronic diastolic heart failure (HCC) 11/12/2015  . CHF (congestive heart failure) (HCC) 11/09/2015  . Acute on chronic respiratory failure (HCC) 11/05/2015  . Palliative care  encounter   . DNR (do not resuscitate)   . Diverticulitis 11/02/2015  . Acute diverticulitis 11/02/2015  . AKI (acute kidney injury) (HCC) 11/02/2015  . Anemia 11/02/2015  . RLL pneumonia (HCC) 11/02/2015  . Pneumonia 04/12/2015  . Failure to thrive in adult 11/11/2014  . Acute encephalopathy 11/11/2014  . Generalized weakness 11/11/2014  . Hypokalemia 11/11/2014  . Bradycardia 11/11/2014  . Dehydration 01/26/2014  . Orthostatic dizziness 01/24/2014  . SOB (shortness of breath) 01/24/2014  . Chronic respiratory failure with hypoxia (HCC) 01/24/2014  . Cough with hemoptysis 01/24/2014  . COPD (chronic obstructive pulmonary disease) (HCC) 01/24/2014  . Acute renal failure (HCC) 01/24/2014  . Dizzy 01/24/2014  . Acute-on-chronic respiratory failure (HCC) 10/12/2012  . Chest pain 10/10/2012  . CAP (community acquired pneumonia) 10/10/2012  . Aortic aneurysm (HCC) 04/16/2012  . Chest pain 08/01/2011  . HYPERCALCEMIA 06/16/2008  . ALLERGIC RHINITIS 12/05/2007  . WEIGHT GAIN 11/07/2007  . PAIN IN JOINT, LOWER LEG 10/09/2007  . TOBACCO ABUSE 09/29/2007  . Abdominal aortic aneurysm (HCC) 09/29/2007  . Sciatica 09/24/2007  . BACK PAIN 09/24/2007  . Anxiety disorder 11/28/2006  . DEPRESSION 11/28/2006  . CATARACT NOS 11/28/2006  . Essential hypertension 11/28/2006  . MYOCARDIAL INFARCTION, HX OF 11/28/2006  . COPD 11/28/2006  . OVERACTIVE BLADDER 11/28/2006  . UTI'S, RECURRENT 11/28/2006  . LOW BACK PAIN 11/28/2006  . URINARY INCONTINENCE 11/28/2006    Past Surgical History:  Procedure  Laterality Date  . APPENDECTOMY    . CHOLECYSTECTOMY      OB History    Gravida Para Term Preterm AB Living   5 5 5          SAB TAB Ectopic Multiple Live Births                   Home Medications    Prior to Admission medications   Medication Sig Start Date End Date Taking? Authorizing Provider  acetaminophen (TYLENOL) 500 MG tablet Take 250-500 mg by mouth every 6 (six) hours as  needed for mild pain, moderate pain, fever or headache.     [provider]  albuterol (PROVENTIL) (2.5 MG/3ML) 0.083% nebulizer solution Take 2.5 mg by nebulization every 6 (six) hours as needed for wheezing or shortness of breath.     [provider]  ALPRAZolam Prudy Feeler) 1 MG tablet Take 1 mg by mouth 3 (three) times daily.    [provider]  aspirin EC 81 MG tablet Take 81 mg by mouth daily.    [provider]  budesonide-formoterol (SYMBICORT) 160-4.5 MCG/ACT inhaler Inhale 2 puffs into the lungs 2 (two) times daily.     [provider]  citalopram (CELEXA) 20 MG tablet Take 20 mg by mouth at bedtime.     [provider]  gabapentin (NEURONTIN) 100 MG capsule Take 100 mg by mouth 5 (five) times daily.     [provider]  isosorbide mononitrate (IMDUR) 30 MG 24 hr tablet Take 1 tablet (30 mg total) by mouth daily. 06/07/16   Kari Baars, MD  nitroGLYCERIN (NITROSTAT) 0.4 MG SL tablet Place 0.4 mg under the tongue every 5 (five) minutes as needed for chest pain. 08/17/16   [provider]  oxyCODONE-acetaminophen (PERCOCET) 10-325 MG tablet Take 1 tablet by mouth every 6 (six) hours as needed for pain.     Kari Baars, MD  tiotropium (SPIRIVA) 18 MCG inhalation capsule Place 18 mcg into inhaler and inhale at bedtime.     [provider]  torsemide (DEMADEX) 20 MG tablet Take 1 tablet (20 mg total) by mouth 2 (two) times daily. 11/12/15   Kari Baars, MD  traMADol (ULTRAM) 50 MG tablet Take 1 tablet (50 mg total) by mouth every 6 (six) hours as needed. Patient taking differently: Take 50 mg by mouth every 6 (six) hours as needed for moderate pain.  02/10/17   [name], MD    Family History Family History  Problem Relation Age of Onset  . Diabetes Brother   . Cancer Brother        Throat cancer, Heart attack  . Heart disease Brother   . Heart attack Brother   . Cancer Father   . Cancer Sister       Social History Social History  Substance Use Topics  . Smoking status: Former Smoker    Years: 50.00    Types: Cigarettes    Quit date: 12/31/2010  . Smokeless tobacco: Former Neurosurgeon    Quit date: 12/31/2010  . Alcohol use No     Allergies   Ciprofloxacin hcl; Nsaids; Other; Sulfa antibiotics; Sulfur; and Tomato   Review of Systems Review of Systems  Neurological: Negative for headaches.  All other systems reviewed and are negative.    Physical Exam Updated Vital Signs BP (!) 123/93 (BP Location: Right Arm)   Pulse 60   Temp 98.4 F (36.9 C) (Oral)   Resp 18   Ht 5'  1" (1.549 m)   Wt 57.6 kg (127 lb)   SpO2 94%   BMI 24.00 kg/m   Physical Exam  Constitutional: She is oriented to person, place, and time. She appears well-developed and well-nourished. No distress.  HENT:  Head: Normocephalic and atraumatic.  Eyes: EOM are normal. Pupils are equal, round, and reactive to light.  Neck: Normal range of motion. Neck supple.  There is no cervical spine tenderness or step-offs. She has painless range of motion in all directions.  Cardiovascular: Normal rate and regular rhythm.  Exam reveals no gallop and no friction rub.   No murmur heard. Pulmonary/Chest: Effort normal and breath sounds normal. No respiratory distress. She has no wheezes.  Abdominal: Soft. Bowel sounds are normal. She exhibits no distension. There is no tenderness.  Musculoskeletal: Normal range of motion.  Neurological: She is alert and oriented to person, place, and time. No cranial nerve deficit. She exhibits normal muscle tone. Coordination normal.  Skin: Skin is warm and dry. She is not diaphoretic.  Nursing note and vitals reviewed.    ED Treatments / Results  Labs (all labs ordered are listed, but only abnormal results are displayed) Labs Reviewed - No data to display  EKG  EKG Interpretation None       Radiology No results found.  Procedures Procedures (including critical care  time)  Medications Ordered in ED Medications - No data to display   Initial Impression / Assessment and Plan / ED Course  I have reviewed the triage vital signs and the nursing notes.  Pertinent labs & imaging results that were available during my care of the patient were reviewed by me and considered in my medical decision making (see chart for details).  Head CT negative and she is neurologically intact. Patient will be discharged with head precautions and as needed follow-up/return.  Final Clinical Impressions(s) / ED Diagnoses   Final diagnoses:  None    New Prescriptions New Prescriptions   No medications on file     Geoffery Lyons, MD 05/21/17 2034

## 2017-05-26 ENCOUNTER — Emergency Department (HOSPITAL_COMMUNITY)
Admission: EM | Admit: 2017-05-26 | Discharge: 2017-05-26 | Disposition: A | Discharge status: Home Health | Payer: Medicare HMO | Attending: Emergency Medicine | Admitting: Emergency Medicine

## 2017-05-26 ENCOUNTER — Encounter (HOSPITAL_COMMUNITY): Payer: Self-pay | Admitting: Emergency Medicine

## 2017-05-26 DIAGNOSIS — Z7982 Long term (current) use of aspirin: Secondary | ICD-10-CM | POA: Diagnosis not present

## 2017-05-26 DIAGNOSIS — I5033 Acute on chronic diastolic (congestive) heart failure: Secondary | ICD-10-CM | POA: Diagnosis not present

## 2017-05-26 DIAGNOSIS — I130 Hypertensive heart and chronic kidney disease with heart failure and stage 1 through stage 4 chronic kidney disease, or unspecified chronic kidney disease: Secondary | ICD-10-CM | POA: Insufficient documentation

## 2017-05-26 DIAGNOSIS — J449 Chronic obstructive pulmonary disease, unspecified: Secondary | ICD-10-CM | POA: Insufficient documentation

## 2017-05-26 DIAGNOSIS — Z87891 Personal history of nicotine dependence: Secondary | ICD-10-CM | POA: Insufficient documentation

## 2017-05-26 DIAGNOSIS — Z79899 Other long term (current) drug therapy: Secondary | ICD-10-CM | POA: Insufficient documentation

## 2017-05-26 DIAGNOSIS — L03113 Cellulitis of right upper limb: Secondary | ICD-10-CM | POA: Diagnosis not present

## 2017-05-26 DIAGNOSIS — M79631 Pain in right forearm: Secondary | ICD-10-CM | POA: Diagnosis present

## 2017-05-26 DIAGNOSIS — N183 Chronic kidney disease, stage 3 (moderate): Secondary | ICD-10-CM | POA: Insufficient documentation

## 2017-05-26 MED ORDER — CEPHALEXIN 500 MG PO CAPS: 500.0000 mg | ORAL_CAPSULE | Freq: Two times a day (BID) | ORAL | 0 refills | Status: DC

## 2017-05-26 NOTE — Discharge Instructions (Signed)
Take benadryl or apply benadryl cream for itching. Take keflex as prescribed until all gone. Follow up with family doctor for recheck as needed. Return if fever, rapidly worsening.

## 2017-05-26 NOTE — ED Triage Notes (Signed)
Swollen red area to rt forearm, noticed this morning.

## 2017-05-26 NOTE — ED Provider Notes (Signed)
[facility] DE[facility] Provider Note   CSN: [id number] Arrival date & time: 05/26/17  1454  By signing my name below, I, Melanie Lowe, attest that this documentation has been prepared under the direction and in the presence of [name], PA-C. Electronically Signed: Deland Lowe, ED Scribe. 05/26/17. 3:14 PM.   History   Chief Complaint Chief Complaint  Patient presents with  . Abscess    swollen red area to lower forearm    The history is provided by the patient and a relative. No language interpreter was used.     HPI Comments: Melanie Lowe is a 83 y.o. female who presents to the Emergency Department complaining of "throbbing" mild, gradually worsening right forearm pain with associated swelling and redness that she noticed this morning. The pt reports of a possible insect bite that might have happened yesterday. Neither the pt or her relative have not applied any medication or performed any other methods on the site. No alleviating factors. The pt has a PMHx of PNA,CHF, and AAA. Pt denies fever. No other complaints.    Past Medical History:  Diagnosis Date  . AAA (abdominal aortic aneurysm) (HCC)   . Anxiety   . Arthritis   . CHF (congestive heart failure) (HCC) 11/09/2015  . COPD (chronic obstructive pulmonary disease) (HCC)   . Depression   . Hypertension   . Leg swelling   . Myocardial infarction (HCC)   . Neuropathy   . Oxygen dependent   . Pneumonia   . Stroke [facility])     Patient Active Problem List   Diagnosis Date Noted  . CKD (chronic kidney disease) stage 3, GFR 30-59 ml/min 09/02/2016  . Diastolic dysfunction 09/02/2016  . Malnutrition of moderate degree 04/13/2016  . COPD exacerbation (HCC) 04/11/2016  . Anxiety 04/11/2016  . PNA (pneumonia) 04/11/2016  . Acute on chronic diastolic heart failure (HCC) 11/12/2015  . CHF (congestive heart failure) (HCC) 11/09/2015  . Acute on chronic respiratory failure (HCC) 11/05/2015  . Palliative care  encounter   . DNR (do not resuscitate)   . Diverticulitis 11/02/2015  . Acute diverticulitis 11/02/2015  . AKI (acute kidney injury) (HCC) 11/02/2015  . Anemia 11/02/2015  . RLL pneumonia (HCC) 11/02/2015  . Pneumonia 04/12/2015  . Failure to thrive in adult 11/11/2014  . Acute encephalopathy 11/11/2014  . Generalized weakness 11/11/2014  . Hypokalemia 11/11/2014  . Bradycardia 11/11/2014  . Dehydration 01/26/2014  . Orthostatic dizziness 01/24/2014  . SOB (shortness of breath) 01/24/2014  . Chronic respiratory failure with hypoxia (HCC) 01/24/2014  . Cough with hemoptysis 01/24/2014  . COPD (chronic obstructive pulmonary disease) (HCC) 01/24/2014  . Acute renal failure (HCC) 01/24/2014  . Dizzy 01/24/2014  . Acute-on-chronic respiratory failure (HCC) 10/12/2012  . Chest pain 10/10/2012  . CAP (community acquired pneumonia) 10/10/2012  . Aortic aneurysm (HCC) 04/16/2012  . Chest pain 08/01/2011  . HYPERCALCEMIA 06/16/2008  . ALLERGIC RHINITIS 12/05/2007  . WEIGHT GAIN 11/07/2007  . PAIN IN JOINT, LOWER LEG 10/09/2007  . TOBACCO ABUSE 09/29/2007  . Abdominal aortic aneurysm (HCC) 09/29/2007  . Sciatica 09/24/2007  . BACK PAIN 09/24/2007  . Anxiety disorder 11/28/2006  . DEPRESSION 11/28/2006  . CATARACT NOS 11/28/2006  . Essential hypertension 11/28/2006  . MYOCARDIAL INFARCTION, HX OF 11/28/2006  . COPD 11/28/2006  . OVERACTIVE BLADDER 11/28/2006  . UTI'S, RECURRENT 11/28/2006  . LOW BACK PAIN 11/28/2006  . URINARY INCONTINENCE 11/28/2006    Past Surgical History:  Procedure Laterality Date  . APPENDECTOMY    .  CHOLECYSTECTOMY      OB History    Gravida Para Term Preterm AB Living   5 5 5          SAB TAB Ectopic Multiple Live Births                   Home Medications    Prior to Admission medications   Medication Sig Start Date End Date Taking? Authorizing Provider  acetaminophen (TYLENOL) 500 MG tablet Take 250-500 mg by mouth every 6 (six) hours as  needed for mild pain, moderate pain, fever or headache.     [provider]  albuterol (PROVENTIL) (2.5 MG/3ML) 0.083% nebulizer solution Take 2.5 mg by nebulization every 6 (six) hours as needed for wheezing or shortness of breath.     [provider]  ALPRAZolam Prudy Feeler) 1 MG tablet Take 1 mg by mouth 3 (three) times daily.    [provider]  aspirin EC 81 MG tablet Take 81 mg by mouth daily.    [provider]  budesonide-formoterol (SYMBICORT) 160-4.5 MCG/ACT inhaler Inhale 2 puffs into the lungs 2 (two) times daily.     [provider]  citalopram (CELEXA) 20 MG tablet Take 20 mg by mouth at bedtime.     [provider]  gabapentin (NEURONTIN) 100 MG capsule Take 100 mg by mouth 5 (five) times daily.     [provider]  isosorbide mononitrate (IMDUR) 30 MG 24 hr tablet Take 1 tablet (30 mg total) by mouth daily. 06/07/16   Kari Baars, MD  nitroGLYCERIN (NITROSTAT) 0.4 MG SL tablet Place 0.4 mg under the tongue every 5 (five) minutes as needed for chest pain. 08/17/16   [provider]  oxyCODONE-acetaminophen (PERCOCET) 10-325 MG tablet Take 1 tablet by mouth every 6 (six) hours as needed for pain.     Kari Baars, MD  tiotropium (SPIRIVA) 18 MCG inhalation capsule Place 18 mcg into inhaler and inhale at bedtime.     [provider]  torsemide (DEMADEX) 20 MG tablet Take 1 tablet (20 mg total) by mouth 2 (two) times daily. 11/12/15   Kari Baars, MD  traMADol (ULTRAM) 50 MG tablet Take 1 tablet (50 mg total) by mouth every 6 (six) hours as needed. Patient taking differently: Take 50 mg by mouth every 6 (six) hours as needed for moderate pain.  02/10/17   [name], MD    Family History Family History  Problem Relation Age of Onset  . Diabetes Brother   . Cancer Brother        Throat cancer, Heart attack  . Heart disease Brother   . Heart attack Brother   . Cancer Father   . Cancer Sister       Social History Social History  Substance Use Topics  . Smoking status: Former Smoker    Years: 50.00    Types: Cigarettes    Quit date: 12/31/2010  . Smokeless tobacco: Former Neurosurgeon    Quit date: 12/31/2010  . Alcohol use No     Allergies   Ciprofloxacin hcl; Nsaids; Other; Sulfa antibiotics; Sulfur; and Tomato   Review of Systems Review of Systems  Constitutional: Negative for fever.  Skin: Positive for color change.       Denies swelling  Neurological: Negative for weakness and numbness.  All other systems reviewed and are negative.    Physical Exam Updated Vital Signs BP 111/72 (BP Location: Right Arm)   Pulse 69   Temp 98 F (  36.7 C) (Oral)   Resp (!) 21   Ht 5\' 1"  (1.549 m)   Wt 127 lb (57.6 kg)   SpO2 95%   BMI 24.00 kg/m   Physical Exam  Constitutional: She is oriented to person, place, and time. She appears well-developed and well-nourished.  HENT:  Head: Normocephalic.  Eyes: EOM are normal.  Neck: Normal range of motion.  Pulmonary/Chest: Effort normal.  Abdominal: She exhibits no distension.  Musculoskeletal: Normal range of motion.  Neurological: She is alert and oriented to person, place, and time.  Skin:  4x6 cm area of swelling, erythema, tenderness to the right posterior forearm. No induration or skin fluctuance.   Psychiatric: She has a normal mood and affect.  Nursing note and vitals reviewed.    ED Treatments / Results   DIAGNOSTIC STUDIES: Oxygen Saturation is 95% on RA, normal by my interpretation.   COORDINATION OF CARE: 3:11 PM-Discussed next steps with pt. Pt verbalized understanding and is agreeable with the plan.   Labs (all labs ordered are listed, but only abnormal results are displayed) Labs Reviewed - No data to display  EKG  EKG Interpretation None       Radiology No results found.  Procedures Procedures (including critical care time)  Medications Ordered in ED Medications - No data to  display   Initial Impression / Assessment and Plan / ED Course  I have reviewed the triage vital signs and the nursing notes.  Pertinent labs & imaging results that were available during my care of the patient were reviewed by me and considered in my medical decision making (see chart for details).     Pt in ED with what appears to be possibly localized reaction to a bite, unable to rule out cellulitis. Area is itchy, red, warm, and mildly tender. No fever, chills. No systemic symptoms. Normal VS. Discussed with Dr. Adriana Simas who has seen pt as well. Agrees with tx plan of benadryl, antibiotics. Follow up in 2 days for recheck.   Vitals:   05/26/17 1501 05/26/17 1502  BP:  111/72  Pulse:  69  Resp:  (!) 21  Temp:  98 F (36.7 C)  TempSrc:  Oral  SpO2:  95%  Weight: 57.6 kg (127 lb)   Height: 5\' 1"  (1.549 m)      Final Clinical Impressions(s) / ED Diagnoses   Final diagnoses:  Cellulitis of right forearm    New Prescriptions New Prescriptions   CEPHALEXIN (KEFLEX) 500 MG CAPSULE    Take 1 capsule (500 mg total) by mouth 2 (two) times daily.   I personally performed the services described in this documentation, which was scribed in my presence. The recorded information has been reviewed and is accurate.    Jaynie Crumble, PA-C 05/26/17 1528    Jaynie Crumble, PA-C 05/26/17 1530    Donnetta Hutching, MD 06/02/17 [phone]

## 2017-05-31 DIAGNOSIS — J449 Chronic obstructive pulmonary disease, unspecified: Secondary | ICD-10-CM | POA: Diagnosis not present

## 2017-06-11 DIAGNOSIS — R0902 Hypoxemia: Secondary | ICD-10-CM | POA: Diagnosis not present

## 2017-06-24 ENCOUNTER — Inpatient Hospital Stay (HOSPITAL_COMMUNITY)
Admission: EM | Admit: 2017-06-24 | Discharge: 2017-06-27 | DRG: 191 | Disposition: A | Discharge status: Home / Self Care | Payer: Medicare HMO | Attending: Pulmonary Disease | Admitting: Pulmonary Disease

## 2017-06-24 ENCOUNTER — Emergency Department (HOSPITAL_COMMUNITY): Payer: Medicare HMO

## 2017-06-24 ENCOUNTER — Encounter (HOSPITAL_COMMUNITY): Payer: Self-pay | Admitting: *Deleted

## 2017-06-24 DIAGNOSIS — R0602 Shortness of breath: Secondary | ICD-10-CM | POA: Diagnosis not present

## 2017-06-24 DIAGNOSIS — R627 Adult failure to thrive: Secondary | ICD-10-CM | POA: Diagnosis not present

## 2017-06-24 DIAGNOSIS — N183 Chronic kidney disease, stage 3 unspecified: Secondary | ICD-10-CM | POA: Diagnosis present

## 2017-06-24 DIAGNOSIS — R7989 Other specified abnormal findings of blood chemistry: Secondary | ICD-10-CM

## 2017-06-24 DIAGNOSIS — Z7982 Long term (current) use of aspirin: Secondary | ICD-10-CM

## 2017-06-24 DIAGNOSIS — D649 Anemia, unspecified: Secondary | ICD-10-CM

## 2017-06-24 DIAGNOSIS — R001 Bradycardia, unspecified: Secondary | ICD-10-CM | POA: Diagnosis present

## 2017-06-24 DIAGNOSIS — F329 Major depressive disorder, single episode, unspecified: Secondary | ICD-10-CM | POA: Diagnosis present

## 2017-06-24 DIAGNOSIS — Z9981 Dependence on supplemental oxygen: Secondary | ICD-10-CM | POA: Diagnosis not present

## 2017-06-24 DIAGNOSIS — I10 Essential (primary) hypertension: Secondary | ICD-10-CM | POA: Diagnosis not present

## 2017-06-24 DIAGNOSIS — I5032 Chronic diastolic (congestive) heart failure: Secondary | ICD-10-CM | POA: Diagnosis present

## 2017-06-24 DIAGNOSIS — D631 Anemia in chronic kidney disease: Secondary | ICD-10-CM | POA: Diagnosis present

## 2017-06-24 DIAGNOSIS — F419 Anxiety disorder, unspecified: Secondary | ICD-10-CM | POA: Diagnosis present

## 2017-06-24 DIAGNOSIS — Z91018 Allergy to other foods: Secondary | ICD-10-CM

## 2017-06-24 DIAGNOSIS — R1310 Dysphagia, unspecified: Secondary | ICD-10-CM | POA: Diagnosis present

## 2017-06-24 DIAGNOSIS — I2510 Atherosclerotic heart disease of native coronary artery without angina pectoris: Secondary | ICD-10-CM | POA: Diagnosis present

## 2017-06-24 DIAGNOSIS — Z8249 Family history of ischemic heart disease and other diseases of the circulatory system: Secondary | ICD-10-CM | POA: Diagnosis not present

## 2017-06-24 DIAGNOSIS — I252 Old myocardial infarction: Secondary | ICD-10-CM

## 2017-06-24 DIAGNOSIS — Z882 Allergy status to sulfonamides status: Secondary | ICD-10-CM

## 2017-06-24 DIAGNOSIS — I712 Thoracic aortic aneurysm, without rupture: Secondary | ICD-10-CM | POA: Diagnosis present

## 2017-06-24 DIAGNOSIS — I130 Hypertensive heart and chronic kidney disease with heart failure and stage 1 through stage 4 chronic kidney disease, or unspecified chronic kidney disease: Secondary | ICD-10-CM | POA: Diagnosis not present

## 2017-06-24 DIAGNOSIS — J9611 Chronic respiratory failure with hypoxia: Secondary | ICD-10-CM | POA: Diagnosis not present

## 2017-06-24 DIAGNOSIS — Z79899 Other long term (current) drug therapy: Secondary | ICD-10-CM | POA: Diagnosis not present

## 2017-06-24 DIAGNOSIS — G894 Chronic pain syndrome: Secondary | ICD-10-CM | POA: Diagnosis not present

## 2017-06-24 DIAGNOSIS — Z7951 Long term (current) use of inhaled steroids: Secondary | ICD-10-CM

## 2017-06-24 DIAGNOSIS — N289 Disorder of kidney and ureter, unspecified: Secondary | ICD-10-CM

## 2017-06-24 DIAGNOSIS — Z8673 Personal history of transient ischemic attack (TIA), and cerebral infarction without residual deficits: Secondary | ICD-10-CM

## 2017-06-24 DIAGNOSIS — R0682 Tachypnea, not elsewhere classified: Secondary | ICD-10-CM | POA: Diagnosis not present

## 2017-06-24 DIAGNOSIS — I440 Atrioventricular block, first degree: Secondary | ICD-10-CM | POA: Diagnosis present

## 2017-06-24 DIAGNOSIS — Z87891 Personal history of nicotine dependence: Secondary | ICD-10-CM

## 2017-06-24 DIAGNOSIS — R05 Cough: Secondary | ICD-10-CM | POA: Diagnosis not present

## 2017-06-24 DIAGNOSIS — R0789 Other chest pain: Secondary | ICD-10-CM | POA: Diagnosis not present

## 2017-06-24 DIAGNOSIS — J441 Chronic obstructive pulmonary disease with (acute) exacerbation: Principal | ICD-10-CM | POA: Diagnosis present

## 2017-06-24 DIAGNOSIS — Z888 Allergy status to other drugs, medicaments and biological substances status: Secondary | ICD-10-CM

## 2017-06-24 DIAGNOSIS — R079 Chest pain, unspecified: Secondary | ICD-10-CM | POA: Diagnosis not present

## 2017-06-24 LAB — CBC
HCT: 32.7 % — ABNORMAL LOW (ref 36.0–46.0)
HEMOGLOBIN: 10.9 g/dL — AB (ref 12.0–15.0)
MCH: 31.6 pg (ref 26.0–34.0)
MCHC: 33.3 g/dL (ref 30.0–36.0)
MCV: 94.8 fL (ref 78.0–100.0)
PLATELETS: 198 10*3/uL (ref 150–400)
RBC: 3.45 MIL/uL — ABNORMAL LOW (ref 3.87–5.11)
RDW: 13.1 % (ref 11.5–15.5)
WBC: 4.9 10*3/uL (ref 4.0–10.5)

## 2017-06-24 LAB — DIFFERENTIAL
BASOS PCT: 0 %
Basophils Absolute: 0 10*3/uL (ref 0.0–0.1)
EOS ABS: 0.3 10*3/uL (ref 0.0–0.7)
EOS PCT: 5 %
LYMPHS ABS: 1.5 10*3/uL (ref 0.7–4.0)
Lymphocytes Relative: 30 %
Monocytes Absolute: 0.5 10*3/uL (ref 0.1–1.0)
Monocytes Relative: 11 %
NEUTROS PCT: 54 %
Neutro Abs: 2.6 10*3/uL (ref 1.7–7.7)

## 2017-06-24 MED ORDER — ASPIRIN 81 MG PO CHEW
324.0000 mg | CHEWABLE_TABLET | Freq: Once | ORAL | Status: AC
  Administered 2017-06-25: 324 mg via ORAL
  Filled 2017-06-24: qty 4

## 2017-06-24 NOTE — ED Triage Notes (Signed)
Pt c/o left side chest pain, sob that started tonight, cough that is productive with white sputum that started a few days ago,

## 2017-06-24 NOTE — ED Provider Notes (Signed)
[facility]Y DEPT Provider Note   CSN: [id number] Arrival date & time: 06/24/17  2252     History   Chief Complaint Chief Complaint  Patient presents with  . Shortness of Breath    HPI Melanie Lowe is a 83 y.o. female.  The history is provided by the patient.  Shortness of Breath   She had onset about 8 PM of a sharp left-sided chest pain with radiation to the neck, left arm, and down her left side to her pelvic brim. She rates pain at 7/10. It is worse with deep breathing, not affected by movement or body position. There is associated dyspnea, but no nausea or diaphoresis. There is a cough productive of clear sputum. She has not done anything to treat her pain. Of note, she does have history of congestive heart failure and myocardial infarction. Also history of COPD and hypertension. She is a former smoker.  Past Medical History:  Diagnosis Date  . AAA (abdominal aortic aneurysm) (HCC)   . Anxiety   . Arthritis   . CHF (congestive heart failure) (HCC) 11/09/2015  . COPD (chronic obstructive pulmonary disease) (HCC)   . Depression   . Hypertension   . Leg swelling   . Myocardial infarction (HCC)   . Neuropathy   . Oxygen dependent   . Pneumonia   . Stroke [facility])     Patient Active Problem List   Diagnosis Date Noted  . CKD (chronic kidney disease) stage 3, GFR 30-59 ml/min 09/02/2016  . Diastolic dysfunction 09/02/2016  . Malnutrition of moderate degree 04/13/2016  . COPD exacerbation (HCC) 04/11/2016  . Anxiety 04/11/2016  . PNA (pneumonia) 04/11/2016  . Acute on chronic diastolic heart failure (HCC) 11/12/2015  . CHF (congestive heart failure) (HCC) 11/09/2015  . Acute on chronic respiratory failure (HCC) 11/05/2015  . Palliative care encounter   . DNR (do not resuscitate)   . Diverticulitis 11/02/2015  . Acute diverticulitis 11/02/2015  . AKI (acute kidney injury) (HCC) 11/02/2015  . Anemia 11/02/2015  . RLL pneumonia (HCC) 11/02/2015  . Pneumonia  04/12/2015  . Failure to thrive in adult 11/11/2014  . Acute encephalopathy 11/11/2014  . Generalized weakness 11/11/2014  . Hypokalemia 11/11/2014  . Bradycardia 11/11/2014  . Dehydration 01/26/2014  . Orthostatic dizziness 01/24/2014  . SOB (shortness of breath) 01/24/2014  . Chronic respiratory failure with hypoxia (HCC) 01/24/2014  . Cough with hemoptysis 01/24/2014  . COPD (chronic obstructive pulmonary disease) (HCC) 01/24/2014  . Acute renal failure (HCC) 01/24/2014  . Dizzy 01/24/2014  . Acute-on-chronic respiratory failure (HCC) 10/12/2012  . Chest pain 10/10/2012  . CAP (community acquired pneumonia) 10/10/2012  . Aortic aneurysm (HCC) 04/16/2012  . Chest pain 08/01/2011  . HYPERCALCEMIA 06/16/2008  . ALLERGIC RHINITIS 12/05/2007  . WEIGHT GAIN 11/07/2007  . PAIN IN JOINT, LOWER LEG 10/09/2007  . TOBACCO ABUSE 09/29/2007  . Abdominal aortic aneurysm (HCC) 09/29/2007  . Sciatica 09/24/2007  . BACK PAIN 09/24/2007  . Anxiety disorder 11/28/2006  . DEPRESSION 11/28/2006  . CATARACT NOS 11/28/2006  . Essential hypertension 11/28/2006  . MYOCARDIAL INFARCTION, HX OF 11/28/2006  . COPD 11/28/2006  . OVERACTIVE BLADDER 11/28/2006  . UTI'S, RECURRENT 11/28/2006  . LOW BACK PAIN 11/28/2006  . URINARY INCONTINENCE 11/28/2006    Past Surgical History:  Procedure Laterality Date  . APPENDECTOMY    . CHOLECYSTECTOMY      OB History    Gravida Para Term Preterm AB Living   5 5 5  SAB TAB Ectopic Multiple Live Births                   Home Medications    Prior to Admission medications   Medication Sig Start Date End Date Taking? Authorizing Provider  acetaminophen (TYLENOL) 500 MG tablet Take 250-500 mg by mouth every 6 (six) hours as needed for mild pain, moderate pain, fever or headache.     [provider]  albuterol (PROVENTIL) (2.5 MG/3ML) 0.083% nebulizer solution Take 2.5 mg by nebulization every 6 (six) hours as needed for wheezing or  shortness of breath.     [provider]  ALPRAZolam Prudy Feeler) 1 MG tablet Take 1 mg by mouth 3 (three) times daily.    [provider]  aspirin EC 81 MG tablet Take 81 mg by mouth daily.    [provider]  budesonide-formoterol (SYMBICORT) 160-4.5 MCG/ACT inhaler Inhale 2 puffs into the lungs 2 (two) times daily.     [provider]  cephALEXin (KEFLEX) 500 MG capsule Take 1 capsule (500 mg total) by mouth 2 (two) times daily. 05/26/17   Kirichenko, Tatyana, PA-C  citalopram (CELEXA) 20 MG tablet Take 20 mg by mouth at bedtime.     [provider]  gabapentin (NEURONTIN) 100 MG capsule Take 100 mg by mouth 5 (five) times daily.     [provider]  isosorbide mononitrate (IMDUR) 30 MG 24 hr tablet Take 1 tablet (30 mg total) by mouth daily. 06/07/16   Kari Baars, MD  nitroGLYCERIN (NITROSTAT) 0.4 MG SL tablet Place 0.4 mg under the tongue every 5 (five) minutes as needed for chest pain. 08/17/16   [provider]  oxyCODONE-acetaminophen (PERCOCET) 10-325 MG tablet Take 1 tablet by mouth every 6 (six) hours as needed for pain.     Kari Baars, MD  tiotropium (SPIRIVA) 18 MCG inhalation capsule Place 18 mcg into inhaler and inhale at bedtime.     [provider]  torsemide (DEMADEX) 20 MG tablet Take 1 tablet (20 mg total) by mouth 2 (two) times daily. 11/12/15   Kari Baars, MD  traMADol (ULTRAM) 50 MG tablet Take 1 tablet (50 mg total) by mouth every 6 (six) hours as needed. Patient taking differently: Take 50 mg by mouth every 6 (six) hours as needed for moderate pain.  02/10/17   [name], MD    Family History Family History  Problem Relation Age of Onset  . Diabetes Brother   . Cancer Brother        Throat cancer, Heart attack  . Heart disease Brother   . Heart attack Brother   . Cancer Father   . Cancer Sister     Social History Social History  Substance Use Topics  . Smoking status: Former  Smoker    Years: 50.00    Types: Cigarettes    Quit date: 12/31/2010  . Smokeless tobacco: Former Neurosurgeon    Quit date: 12/31/2010  . Alcohol use No     Allergies   Ciprofloxacin hcl; Nsaids; Other; Sulfa antibiotics; Sulfur; and Tomato   Review of Systems Review of Systems  Respiratory: Positive for shortness of breath.   All other systems reviewed and are negative.    Physical Exam Updated Vital Signs BP 97/72 Comment: Simultaneous filing. User may not have seen previous data.  Pulse 74 Comment: Simultaneous filing. User may not have seen previous data.  Temp 97.5 F (36.4 C) (Oral)   Resp (!) 24   Ht  5\' 1"  (1.549 m)   Wt 57.6 kg (127 lb)   SpO2 92% Comment: Simultaneous filing. User may not have seen previous data.  BMI 24.00 kg/m   Physical Exam  Nursing note and vitals reviewed.  83 year old female, resting comfortably and in no acute distress. Vital signs are significant for tachypnea. Oxygen saturation is 92%, which is normal. Head is normocephalic and atraumatic. PERRLA, EOMI. Oropharynx is clear. Neck is nontender and supple without adenopathy or JVD. Back is nontender and there is no CVA tenderness. Lungs have bibasilar rales which are more prominent on the left. There are no wheezes or rhonchi. Chest is moderately tender in the left anterior chest wall. Heart has regular rate and rhythm without murmur. Abdomen is soft, flat, nontender without masses or hepatosplenomegaly and peristalsis is normoactive. Extremities have no cyanosis or edema, full range of motion is present. Skin is warm and dry without rash. Neurologic: Mental status is normal, cranial nerves are intact, there are no motor or sensory deficits.  ED Treatments / Results  Labs (all labs ordered are listed, but only abnormal results are displayed) Labs Reviewed  BASIC METABOLIC PANEL - Abnormal; Notable for the following:       Result Value   Glucose, Bld 107 (*)    BUN 39 (*)    Creatinine,  Ser 1.41 (*)    GFR calc non Af Amer 32 (*)    GFR calc Af Amer 37 (*)    All other components within normal limits  CBC - Abnormal; Notable for the following:    RBC 3.45 (*)    Hemoglobin 10.9 (*)    HCT 32.7 (*)    All other components within normal limits  D-DIMER, QUANTITATIVE (NOT AT [facility]) - Abnormal; Notable for the following:    D-Dimer, Quant 3.45 (*)    All other components within normal limits  BRAIN NATRIURETIC PEPTIDE - Abnormal; Notable for the following:    B Natriuretic Peptide 123.0 (*)    All other components within normal limits  TROPONIN I  DIFFERENTIAL    EKG  EKG Interpretation  Date/Time:  Monday June 24 2017 22:57:07 EDT Ventricular Rate:  76 PR Interval:    QRS Duration: 88 QT Interval:  396 QTC Calculation: 446 R Axis:   17 Text Interpretation:  Sinus rhythm Prolonged PR interval Low voltage, precordial leads When compared with ECG of 05/10/2017, HEART RATE has increased Confirmed by Dione Booze ([id number]) on 06/24/2017 11:11:56 PM       Radiology Dg Chest 2 View  Result Date: 06/24/2017 CLINICAL DATA:  Left-sided chest pain and dyspnea, onset tonight. Several days history of productive cough. EXAM: CHEST  2 VIEW COMPARISON:  02/10/2017, 05/10/17. FINDINGS: Marked aortic tortuosity, unchanged. Mild cardiomegaly, unchanged. Stable chronic scarring bilaterally. No airspace consolidation. No effusion. Normal pulmonary vasculature. Chronic arthropathic changes of both shoulders. IMPRESSION: Stable cardiomegaly and aortic tortuosity. No consolidation or effusion. Electronically Signed   By: Ellery Plunk M.D.   On: 06/24/2017 23:26   Ct Angio Chest Pe W And/or Wo Contrast  Result Date: 06/25/2017 CLINICAL DATA:  83 y/o F; shortness of breath and elevated D-dimer. History of COPD. EXAM: CT ANGIOGRAPHY CHEST WITH CONTRAST TECHNIQUE: Multidetector CT imaging of the chest was performed using the standard protocol during bolus administration of intravenous  contrast. Multiplanar CT image reconstructions and MIPs were obtained to evaluate the vascular anatomy. CONTRAST:  64 cc Isovue 370 COMPARISON:  05/10/2017 CTA of the chest. FINDINGS: Cardiovascular:  Stable 4.6 cm ascending thoracic aortic aneurysm. 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 Moderate diffuse calcific atherosclerosis. Normal heart size. No pericardial effusion. Moderate coronary artery calcifications. Satisfactory opacification of the pulmonary arteries. Mild respiratory motion artifact predominantly in the lung bases. No pulmonary embolus is identified. Mediastinum/Nodes: Mild heterogeneity with coarse calcifications in left lobe of thyroid. Lungs/Pleura: Moderate to severe centrilobular emphysema. Diffuse peribronchial thickening and scattered mucous plugging in the lung bases. Mild predominantly basilar cylindrical bronchiectasis. Platelike atelectasis within the left upper lobe lingula. Upper Abdomen: No acute abnormality. Musculoskeletal: No chest wall abnormality. No acute or significant osseous findings. Review of the MIP images confirms the above findings. IMPRESSION: 1. No pulmonary embolus identified. 2. Stable 4.6 cm ascending thoracic aortic aneurysm. Ascending thoracic aortic aneurysm. Recommend semi-annual imaging followup by CTA or MRA and referral to cardiothoracic surgery if not already obtained. This recommendation follows 2010 ACCF/AHA/AATS/ACR/ASA/SCA/SCAI/SIR/STS/SVM Guidelines for the Diagnosis and Management of Patients With Thoracic Aortic Disease. Circulation. 2010; 121: O177-N165 3. Stable penetrating ulcer arising from the left lateral wall of the aortic arch. 4. Stable moderate to severe centrilobular emphysema of the lungs. 5. Diffuse peribronchial thickening and scattered mucous plugging in the lung bases is compatible with acute bronchitis. 6. No consolidation to suggest pneumonia. Electronically Signed   By:  Mitzi Hansen M.D.   On: 06/25/2017 03:13    Procedures Procedures (including critical care time)  Medications Ordered in ED Medications  ipratropium-albuterol (DUONEB) 0.5-2.5 (3) MG/3ML nebulizer solution 3 mL (not administered)  aspirin chewable tablet 324 mg (324 mg Oral Given 06/25/17 0010)  morphine 4 MG/ML injection 4 mg (4 mg Intravenous Given 06/25/17 0121)  iopamidol (ISOVUE-370) 76 % injection 64 mL ( Intravenous Contrast Given 06/25/17 0246)  ipratropium-albuterol (DUONEB) 0.5-2.5 (3) MG/3ML nebulizer solution 3 mL (3 mLs Nebulization Given 06/25/17 0414)  dexamethasone (DECADRON) injection 10 mg (10 mg Intravenous Given 06/25/17 0501)     Initial Impression / Assessment and Plan / ED Course  I have reviewed the triage vital signs and the nursing notes.  Pertinent labs & imaging results that were available during my care of the patient were reviewed by me and considered in my medical decision making (see chart for details).  Cough and chest pain with dominant rales at left base. Chest x-ray is obtained to rule out pneumonia. ECG is reassuring. Screening labs are obtained.  Chest x-ray shows no evidence of pneumonia. Laboratory workup shows renal insufficiency unchanged from baseline. There has been a drop in hemoglobin compared with 6 weeks ago, but is at the level it had been 4 months ago. BNP is only minimally elevated at 123, of uncertain significance. D-dimer is obtained which is elevated. She is sent for CT angiogram which shows no evidence of pulmonary embolism or occult pneumonia. Findings were suggestive of bronchitis per G is given albuterol with ipratropium but continued to feel dyspneic. When I am bleeding, oxygen saturation dropped from 94% 88% 10 dyspnea got significantly worse. This was with supplemental oxygen. She had been given a dose of dexamethasone. At this point, it is not clear whether the causes COPD or CHF or combination. However, she is too dyspneic  to go home. Case is discussed with Dr. Conley Rolls of [facility] who agrees to admit the patient under observation status.  Final Clinical Impressions(s) / ED Diagnoses   Final diagnoses:  Shortness of breath  Renal insufficiency  Normochromic normocytic anemia  Elevated brain  natriuretic peptide (BNP) level    New Prescriptions New Prescriptions   No medications on file     Dione Booze, MD 06/25/17 [phone]

## 2017-06-25 ENCOUNTER — Encounter (HOSPITAL_COMMUNITY): Payer: Self-pay | Admitting: *Deleted

## 2017-06-25 ENCOUNTER — Emergency Department (HOSPITAL_COMMUNITY): Payer: Medicare HMO

## 2017-06-25 ENCOUNTER — Inpatient Hospital Stay (HOSPITAL_COMMUNITY): Payer: Medicare HMO

## 2017-06-25 DIAGNOSIS — R627 Adult failure to thrive: Secondary | ICD-10-CM | POA: Diagnosis present

## 2017-06-25 DIAGNOSIS — R001 Bradycardia, unspecified: Secondary | ICD-10-CM | POA: Diagnosis present

## 2017-06-25 DIAGNOSIS — R0789 Other chest pain: Secondary | ICD-10-CM | POA: Diagnosis not present

## 2017-06-25 DIAGNOSIS — Z7951 Long term (current) use of inhaled steroids: Secondary | ICD-10-CM | POA: Diagnosis not present

## 2017-06-25 DIAGNOSIS — Z8673 Personal history of transient ischemic attack (TIA), and cerebral infarction without residual deficits: Secondary | ICD-10-CM | POA: Diagnosis not present

## 2017-06-25 DIAGNOSIS — N183 Chronic kidney disease, stage 3 (moderate): Secondary | ICD-10-CM

## 2017-06-25 DIAGNOSIS — F329 Major depressive disorder, single episode, unspecified: Secondary | ICD-10-CM | POA: Diagnosis present

## 2017-06-25 DIAGNOSIS — Z87891 Personal history of nicotine dependence: Secondary | ICD-10-CM | POA: Diagnosis not present

## 2017-06-25 DIAGNOSIS — Z8249 Family history of ischemic heart disease and other diseases of the circulatory system: Secondary | ICD-10-CM | POA: Diagnosis not present

## 2017-06-25 DIAGNOSIS — R1310 Dysphagia, unspecified: Secondary | ICD-10-CM | POA: Diagnosis present

## 2017-06-25 DIAGNOSIS — Z79899 Other long term (current) drug therapy: Secondary | ICD-10-CM | POA: Diagnosis not present

## 2017-06-25 DIAGNOSIS — J441 Chronic obstructive pulmonary disease with (acute) exacerbation: Secondary | ICD-10-CM | POA: Diagnosis present

## 2017-06-25 DIAGNOSIS — Z7982 Long term (current) use of aspirin: Secondary | ICD-10-CM | POA: Diagnosis not present

## 2017-06-25 DIAGNOSIS — I2510 Atherosclerotic heart disease of native coronary artery without angina pectoris: Secondary | ICD-10-CM | POA: Diagnosis present

## 2017-06-25 DIAGNOSIS — I130 Hypertensive heart and chronic kidney disease with heart failure and stage 1 through stage 4 chronic kidney disease, or unspecified chronic kidney disease: Secondary | ICD-10-CM | POA: Diagnosis present

## 2017-06-25 DIAGNOSIS — I10 Essential (primary) hypertension: Secondary | ICD-10-CM

## 2017-06-25 DIAGNOSIS — J9611 Chronic respiratory failure with hypoxia: Secondary | ICD-10-CM | POA: Diagnosis present

## 2017-06-25 DIAGNOSIS — G894 Chronic pain syndrome: Secondary | ICD-10-CM | POA: Diagnosis present

## 2017-06-25 DIAGNOSIS — F419 Anxiety disorder, unspecified: Secondary | ICD-10-CM | POA: Diagnosis present

## 2017-06-25 DIAGNOSIS — R7989 Other specified abnormal findings of blood chemistry: Secondary | ICD-10-CM | POA: Diagnosis present

## 2017-06-25 DIAGNOSIS — R0602 Shortness of breath: Secondary | ICD-10-CM | POA: Diagnosis present

## 2017-06-25 DIAGNOSIS — Z9981 Dependence on supplemental oxygen: Secondary | ICD-10-CM | POA: Diagnosis not present

## 2017-06-25 DIAGNOSIS — I440 Atrioventricular block, first degree: Secondary | ICD-10-CM | POA: Diagnosis present

## 2017-06-25 DIAGNOSIS — I712 Thoracic aortic aneurysm, without rupture: Secondary | ICD-10-CM | POA: Diagnosis present

## 2017-06-25 DIAGNOSIS — D631 Anemia in chronic kidney disease: Secondary | ICD-10-CM | POA: Diagnosis present

## 2017-06-25 DIAGNOSIS — I5032 Chronic diastolic (congestive) heart failure: Secondary | ICD-10-CM

## 2017-06-25 DIAGNOSIS — I252 Old myocardial infarction: Secondary | ICD-10-CM | POA: Diagnosis not present

## 2017-06-25 LAB — BASIC METABOLIC PANEL
Anion gap: 7 (ref 5–15)
BUN: 39 mg/dL — AB (ref 6–20)
CHLORIDE: 105 mmol/L (ref 101–111)
CO2: 28 mmol/L (ref 22–32)
CREATININE: 1.41 mg/dL — AB (ref 0.44–1.00)
Calcium: 9.1 mg/dL (ref 8.9–10.3)
GFR calc Af Amer: 37 mL/min — ABNORMAL LOW (ref >60)
GFR calc non Af Amer: 32 mL/min — ABNORMAL LOW (ref >60)
Glucose, Bld: 107 mg/dL — ABNORMAL HIGH (ref 65–99)
POTASSIUM: 4.2 mmol/L (ref 3.5–5.1)
SODIUM: 140 mmol/L (ref 135–145)

## 2017-06-25 LAB — TROPONIN I
Troponin I: <0.03 ng/mL (ref <0.03)
Troponin I: <0.03 ng/mL (ref <0.03)

## 2017-06-25 LAB — ECHOCARDIOGRAM COMPLETE
Height: 61 in
Weight: 2022.4 oz

## 2017-06-25 LAB — D-DIMER, QUANTITATIVE (NOT AT ARMC): D DIMER QUANT: 3.45 ug{FEU}/mL — AB (ref 0.00–0.50)

## 2017-06-25 LAB — LIPID PANEL
CHOL/HDL RATIO: 3.1 ratio
Cholesterol: 132 mg/dL (ref 0–200)
HDL: 42 mg/dL (ref >40)
LDL Cholesterol: 74 mg/dL (ref 0–99)
Triglycerides: 80 mg/dL (ref <150)
VLDL: 16 mg/dL (ref 0–40)

## 2017-06-25 LAB — BRAIN NATRIURETIC PEPTIDE: B Natriuretic Peptide: 123 pg/mL — ABNORMAL HIGH (ref 0.0–100.0)

## 2017-06-25 LAB — PHOSPHORUS: PHOSPHORUS: 3.9 mg/dL (ref 2.5–4.6)

## 2017-06-25 LAB — MAGNESIUM: Magnesium: 2.5 mg/dL — ABNORMAL HIGH (ref 1.7–2.4)

## 2017-06-25 MED ORDER — BUDESONIDE 0.25 MG/2ML IN SUSP
0.2500 mg | Freq: Two times a day (BID) | RESPIRATORY_TRACT | Status: DC
  Administered 2017-06-25 – 2017-06-27 (×5): 0.25 mg via RESPIRATORY_TRACT
  Filled 2017-06-25 (×5): qty 2

## 2017-06-25 MED ORDER — CITALOPRAM HYDROBROMIDE 20 MG PO TABS
20.0000 mg | ORAL_TABLET | Freq: Every day | ORAL | Status: DC
  Administered 2017-06-25 – 2017-06-26 (×2): 20 mg via ORAL
  Filled 2017-06-25 (×2): qty 1

## 2017-06-25 MED ORDER — ONDANSETRON HCL 4 MG PO TABS: 4.0000 mg | ORAL_TABLET | Freq: Four times a day (QID) | ORAL | Status: DC | PRN

## 2017-06-25 MED ORDER — ALBUTEROL SULFATE (2.5 MG/3ML) 0.083% IN NEBU
2.5000 mg | INHALATION_SOLUTION | RESPIRATORY_TRACT | Status: AC | PRN
Start: 2017-06-25 — End: 2017-06-25

## 2017-06-25 MED ORDER — IPRATROPIUM-ALBUTEROL 0.5-2.5 (3) MG/3ML IN SOLN
3.0000 mL | Freq: Four times a day (QID) | RESPIRATORY_TRACT | Status: DC
  Administered 2017-06-25 – 2017-06-27 (×8): 3 mL via RESPIRATORY_TRACT
  Filled 2017-06-25 (×8): qty 3

## 2017-06-25 MED ORDER — GABAPENTIN 100 MG PO CAPS
100.0000 mg | ORAL_CAPSULE | Freq: Every day | ORAL | Status: DC
  Administered 2017-06-25 – 2017-06-27 (×11): 100 mg via ORAL
  Filled 2017-06-25 (×11): qty 1

## 2017-06-25 MED ORDER — OXYCODONE-ACETAMINOPHEN 5-325 MG PO TABS
1.0000 | ORAL_TABLET | Freq: Four times a day (QID) | ORAL | Status: DC | PRN
  Administered 2017-06-25: 1 via ORAL
  Filled 2017-06-25: qty 1

## 2017-06-25 MED ORDER — TORSEMIDE 20 MG PO TABS
20.0000 mg | ORAL_TABLET | Freq: Every day | ORAL | Status: DC
  Administered 2017-06-26 – 2017-06-27 (×2): 20 mg via ORAL
  Filled 2017-06-25 (×3): qty 1

## 2017-06-25 MED ORDER — METHYLPREDNISOLONE SODIUM SUCC 40 MG IJ SOLR
40.0000 mg | Freq: Three times a day (TID) | INTRAMUSCULAR | Status: DC
  Administered 2017-06-25 – 2017-06-27 (×7): 40 mg via INTRAVENOUS
  Filled 2017-06-25 (×7): qty 1

## 2017-06-25 MED ORDER — IOPAMIDOL (ISOVUE-370) INJECTION 76%
64.0000 mL | Freq: Once | INTRAVENOUS | Status: AC | PRN
  Administered 2017-06-25: 03:00:00 via INTRAVENOUS

## 2017-06-25 MED ORDER — OXYCODONE HCL 5 MG PO TABS
5.0000 mg | ORAL_TABLET | Freq: Four times a day (QID) | ORAL | Status: DC | PRN
  Administered 2017-06-25: 5 mg via ORAL
  Filled 2017-06-25: qty 1

## 2017-06-25 MED ORDER — ENOXAPARIN SODIUM 30 MG/0.3ML ~~LOC~~ SOLN
30.0000 mg | SUBCUTANEOUS | Status: DC
Start: 2017-06-25 — End: 2017-06-27
  Administered 2017-06-25 – 2017-06-27 (×3): 30 mg via SUBCUTANEOUS
  Filled 2017-06-25 (×3): qty 0.3

## 2017-06-25 MED ORDER — OXYCODONE-ACETAMINOPHEN 10-325 MG PO TABS: 1.0000 | ORAL_TABLET | Freq: Four times a day (QID) | ORAL | Status: DC | PRN

## 2017-06-25 MED ORDER — SODIUM CHLORIDE 0.9% FLUSH
3.0000 mL | Freq: Two times a day (BID) | INTRAVENOUS | Status: DC
  Administered 2017-06-25 – 2017-06-26 (×4): 3 mL via INTRAVENOUS

## 2017-06-25 MED ORDER — DEXAMETHASONE SODIUM PHOSPHATE 10 MG/ML IJ SOLN
10.0000 mg | Freq: Once | INTRAMUSCULAR | Status: AC
  Administered 2017-06-25: 10 mg via INTRAVENOUS
  Filled 2017-06-25: qty 1

## 2017-06-25 MED ORDER — ASPIRIN EC 81 MG PO TBEC
81.0000 mg | DELAYED_RELEASE_TABLET | Freq: Every day | ORAL | Status: DC
  Administered 2017-06-25 – 2017-06-27 (×3): 81 mg via ORAL
  Filled 2017-06-25 (×3): qty 1

## 2017-06-25 MED ORDER — TORSEMIDE 20 MG PO TABS: 20.0000 mg | ORAL_TABLET | Freq: Two times a day (BID) | ORAL | Status: DC

## 2017-06-25 MED ORDER — ALPRAZOLAM 1 MG PO TABS
1.0000 mg | ORAL_TABLET | Freq: Three times a day (TID) | ORAL | Status: DC
  Administered 2017-06-25 – 2017-06-27 (×6): 1 mg via ORAL
  Filled 2017-06-25 (×7): qty 1

## 2017-06-25 MED ORDER — ISOSORBIDE MONONITRATE ER 60 MG PO TB24
30.0000 mg | ORAL_TABLET | Freq: Every day | ORAL | Status: DC
  Administered 2017-06-25: 30 mg via ORAL
  Filled 2017-06-25: qty 1

## 2017-06-25 MED ORDER — ISOSORBIDE MONONITRATE ER 30 MG PO TB24
45.0000 mg | ORAL_TABLET | Freq: Every day | ORAL | Status: DC
  Administered 2017-06-26 – 2017-06-27 (×2): 45 mg via ORAL
  Filled 2017-06-25 (×2): qty 2

## 2017-06-25 MED ORDER — ACETAMINOPHEN 325 MG PO TABS: 650.0000 mg | ORAL_TABLET | Freq: Four times a day (QID) | ORAL | Status: DC | PRN

## 2017-06-25 MED ORDER — MORPHINE SULFATE (PF) 4 MG/ML IV SOLN
4.0000 mg | Freq: Once | INTRAVENOUS | Status: AC
  Administered 2017-06-25: 4 mg via INTRAVENOUS
  Filled 2017-06-25: qty 1

## 2017-06-25 MED ORDER — IPRATROPIUM-ALBUTEROL 0.5-2.5 (3) MG/3ML IN SOLN
3.0000 mL | Freq: Once | RESPIRATORY_TRACT | Status: AC
  Administered 2017-06-25: 3 mL via RESPIRATORY_TRACT
  Filled 2017-06-25: qty 3

## 2017-06-25 MED ORDER — ONDANSETRON HCL 4 MG/2ML IJ SOLN: 4.0000 mg | Freq: Four times a day (QID) | INTRAMUSCULAR | Status: DC | PRN

## 2017-06-25 MED ORDER — ACETAMINOPHEN 650 MG RE SUPP: 650.0000 mg | Freq: Four times a day (QID) | RECTAL | Status: DC | PRN

## 2017-06-25 NOTE — ED Notes (Signed)
Notified pt's son "[name]" that pt was being admitted. Pt's son requests that MD call him "when he finds out something". Pt's son can be reached at [phone].

## 2017-06-25 NOTE — H&P (Signed)
History and Physical  Melanie Lowe AVW:[id number] DOB: 04/01/1924 DOA: 06/24/2017   PCP: Kari Baars, MD   Patient coming from: Home  Chief Complaint: chest pain, sob  HPI:  Melanie Lowe is a 83 y.o. female with medical history of COPD, anxiety/depression, diastolic CHF, hypertension, CKD stage III, coronary artery disease, chronic respiratory failure on 3 L, thoracic aortic aneurysm, presented with chest pain and shortness of breath that developed on the evening of 06/24/2017 after the patient walked out on the porch to greet some family and subsequently went back to her bedroom. Further history reveals that she has been having intermittent chest tightness and dyspnea on exertion for approximately one month. The patient is a difficult historian, but it appears that she has been having chest tightness at rest as well as with some exertional activities. She has noted some increased dyspnea on exertion with her activities of daily living. She quit smoking 5 years ago, but has at least 50 pack year hx of tobacco.  In addition, the patient states that her son who lives with her has been smoking in the house. She stated that on the evening of 06/24/2017, her chest discomfort lasted longer than usual radiating to her left arm and wrapping around her left breast.   As a result, the patient presented for further evaluation. She has had some subjective fevers and chills but denied any nausea, vomiting, hematemesis, hematochezia, melena, dysuria, hematuria. She has intermittent left lower quadrant abdominal pain and loose stools. This has been a chronic problem for her. She states that she has loose stools every time she eats something. She has also been complaining of slightly worsening cough with white sputum production.  In the emergency department, the patient desaturated to 88% on her usual 3 L. She was afebrile and hemodynamically stable. BMP and CBC were essentially unremarkable. Her serum creatinine  was at her usual baseline. D-dimer was 3.45 and BNP was 123. EKG showed sinus rhythm without ST-T wave changes. She was given bronchodilators, dexamethasone, and morphine.  Assessment/Plan: COPD exacerbation  -Start duo nebs every 6 hours  -Start Pulmicort  -Holding Spiriva  -Solu-Medrol 40 mg IV every 8 hours   Chest pain with underlying coronary artery disease -Finish cycling troponins -The patient has typical and atypical components; partly reproducible on palpation -Personally reviewed EKG--no concerning ischemic changes -Continue Imdur -Echocardiogram -Consult cardiology -CT angiogram chest negative for pulmonary embolus; shows diffuse peribronchial thickening and scattered mucous plugging at the lung bases; bibasilar cylindrical bronchiectasis  Chronic respiratory failure with hypoxia -Patient is on 3 L nasal cannula at home -Presently stable on 3 L  Chronic diastolic CHF  -Appears clinically euvolemic  -No JVD on examination  -06/07/2016 echo EF 65-70%, grade 1 DD, PASP 38  -Continue home dose torsemide   CKD stage III  -Baseline creatinine 1.1-1.4  -A.m. BMP   Anxiety/depression  -Continue home dose alprazolam   Chronic pain syndrome  -Continue home dose Percocet and gabapentin        Past Medical History:  Diagnosis Date  . AAA (abdominal aortic aneurysm) (HCC)   . Anxiety   . Arthritis   . CHF (congestive heart failure) (HCC) 11/09/2015  . COPD (chronic obstructive pulmonary disease) (HCC)   . Depression   . Hypertension   . Leg swelling   . Myocardial infarction (HCC)   . Neuropathy   . Oxygen dependent   . Pneumonia   . Stroke [facility])    Past  Surgical History:  Procedure Laterality Date  . APPENDECTOMY    . CHOLECYSTECTOMY     Social History:  reports that she quit smoking about 6 years ago. Her smoking use included Cigarettes. She quit after 50.00 years of use. She quit smokeless tobacco use about 6 years ago. She reports that she does not  drink alcohol or use drugs.   Family History  Problem Relation Age of Onset  . Diabetes Brother   . Cancer Brother        Throat cancer, Heart attack  . Heart disease Brother   . Heart attack Brother   . Cancer Father   . Cancer Sister      Allergies  Allergen Reactions  . Ciprofloxacin Hcl Nausea And Vomiting  . Nsaids Other (See Comments)    Advised by MD not to take this class of medication.  Pt is able to take coated Aspirin.    . Other Other (See Comments)    Spices trigger acid reflux.   . Sulfa Antibiotics Nausea And Vomiting  . Sulfur Nausea And Vomiting  . Tomato Other (See Comments)    Reaction:  Acid reflux     Prior to Admission medications   Medication Sig Start Date End Date Taking? Authorizing Provider  acetaminophen (TYLENOL) 500 MG tablet Take 250-500 mg by mouth every 6 (six) hours as needed for mild pain, moderate pain, fever or headache.     [provider]  albuterol (PROVENTIL) (2.5 MG/3ML) 0.083% nebulizer solution Take 2.5 mg by nebulization every 6 (six) hours as needed for wheezing or shortness of breath.     [provider]  ALPRAZolam Prudy Feeler) 1 MG tablet Take 1 mg by mouth 3 (three) times daily.    [provider]  aspirin EC 81 MG tablet Take 81 mg by mouth daily.    [provider]  budesonide-formoterol (SYMBICORT) 160-4.5 MCG/ACT inhaler Inhale 2 puffs into the lungs 2 (two) times daily.     [provider]  cephALEXin (KEFLEX) 500 MG capsule Take 1 capsule (500 mg total) by mouth 2 (two) times daily. 05/26/17   Kirichenko, Tatyana, PA-C  citalopram (CELEXA) 20 MG tablet Take 20 mg by mouth at bedtime.     [provider]  gabapentin (NEURONTIN) 100 MG capsule Take 100 mg by mouth 5 (five) times daily.     [provider]  isosorbide mononitrate (IMDUR) 30 MG 24 hr tablet Take 1 tablet (30 mg total) by mouth daily. 06/07/16   Kari Baars, MD  nitroGLYCERIN (NITROSTAT) 0.4 MG SL  tablet Place 0.4 mg under the tongue every 5 (five) minutes as needed for chest pain. 08/17/16   [provider]  oxyCODONE-acetaminophen (PERCOCET) 10-325 MG tablet Take 1 tablet by mouth every 6 (six) hours as needed for pain.     Kari Baars, MD  tiotropium (SPIRIVA) 18 MCG inhalation capsule Place 18 mcg into inhaler and inhale at bedtime.     [provider]  torsemide (DEMADEX) 20 MG tablet Take 1 tablet (20 mg total) by mouth 2 (two) times daily. 11/12/15   Kari Baars, MD  traMADol (ULTRAM) 50 MG tablet Take 1 tablet (50 mg total) by mouth every 6 (six) hours as needed. Patient taking differently: Take 50 mg by mouth every 6 (six) hours as needed for moderate pain.  02/10/17   [name], MD    Review of Systems:  Constitutional:  No weight loss, night sweats, Fevers, chills, fatigue.  Head&Eyes: No  headache.  No vision loss.  No eye pain or scotoma ENT:  No Difficulty swallowing,Tooth/dental problems,Sore throat,  No ear ache, post nasal drip,  Cardio-vascular:  No  Orthopnea, PND,   dizziness, palpitations  GI:  No  abdominal pain, nausea, vomiting,loss of appetite, hematochezia, melena, heartburn, indigestion, Resp:  No  No coughing up of blood .No wheezing.No chest wall deformity  Skin:  no rash or lesions.  GU:  no dysuria, change in color of urine, no urgency or frequency. No flank pain.  Musculoskeletal:  No joint pain or swelling. No decreased range of motion. No back pain.  Psych:  No change in mood or affect. No depression or anxiety. Neurologic: No headache, no dysesthesia, no focal weakness, no vision loss. No syncope  Physical Exam: Vitals:   06/25/17 0545 06/25/17 0600 06/25/17 0620 06/25/17 0649  BP:  98/62  122/73  Pulse: (!) 49 (!) 48  (!) 54  Resp: Temp:    97.9 F (36.6 C)  TempSrc:    Oral  SpO2: 96% 97% 98% 98%  Weight:    57.3 kg (126 lb 6.4 oz)  Height:     (1.549 m)   General:  A&O x 3, NAD,  nontoxic, pleasant/cooperative Head/Eye: No conjunctival hemorrhage, no icterus, [place]/AT, No nystagmus ENT:  No icterus,  No thrush, good dentition, no pharyngeal exudate Neck:  No masses, no lymphadenpathy, no bruits CV:  RRR, no rub, no gallop, no S3, no JVD Lung:  Diminished breath sounds bilaterally. Bibasilar crackles. No wheezing.  Abdomen: soft/NT, +BS, nondistended, no peritoneal signs Ext: No cyanosis, No rashes, No petechiae, No lymphangitis, No edema Neuro: CNII-XII intact, strength 4/5 in bilateral upper and lower extremities, no dysmetria  Labs on Admission:  Basic Metabolic Panel:  Recent Labs Lab 06/24/17 2333  NA 140  K 4.2  CL 105  CO2 28  GLUCOSE 107*  BUN 39*  CREATININE 1.41*  CALCIUM 9.1   Liver Function Tests: No results for input(s): AST, ALT, ALKPHOS, BILITOT, PROT, ALBUMIN in the last 168 hours. No results for input(s): LIPASE, AMYLASE in the last 168 hours. No results for input(s): AMMONIA in the last 168 hours. CBC:  Recent Labs Lab 06/24/17 2333  WBC 4.9  NEUTROABS 2.6  HGB 10.9*  HCT 32.7*  MCV 94.8  PLT 198   Coagulation Profile: No results for input(s): INR, PROTIME in the last 168 hours. Cardiac Enzymes:  Recent Labs Lab 06/24/17 2333  TROPONINI <0.03   BNP: Invalid input(s): POCBNP CBG: No results for input(s): GLUCAP in the last 168 hours. Urine analysis:    Component Value Date/Time   COLORURINE YELLOW 03/06/2017 1958   APPEARANCEUR CLEAR 03/06/2017 1958   LABSPEC 1.008 03/06/2017 1958   PHURINE 6.0 03/06/2017 1958   GLUCOSEU NEGATIVE 03/06/2017 1958   HGBUR MODERATE (A) 03/06/2017 1958   BILIRUBINUR NEGATIVE 03/06/2017 1958   KETONESUR NEGATIVE 03/06/2017 1958   PROTEINUR NEGATIVE 03/06/2017 1958   UROBILINOGEN 0.2 11/02/2015 2014   NITRITE NEGATIVE 03/06/2017 1958   LEUKOCYTESUR NEGATIVE 03/06/2017 1958   Sepsis Labs: (procalcitonin:4,lacticidven:4) )No results found for this or any previous visit  (from the past 240 hour(s)).   Radiological Exams on Admission: Dg Chest 2 View  Result Date: 06/24/2017 CLINICAL DATA:  Left-sided chest pain and dyspnea, onset tonight. Several days history of productive cough. EXAM: CHEST  2 VIEW COMPARISON:  02/10/2017, 05/10/17. FINDINGS: Marked aortic tortuosity, unchanged. Mild cardiomegaly, unchanged. Stable chronic scarring bilaterally. No  airspace consolidation. No effusion. Normal pulmonary vasculature. Chronic arthropathic changes of both shoulders. IMPRESSION: Stable cardiomegaly and aortic tortuosity. No consolidation or effusion. Electronically Signed   By: Ellery Plunk M.D.   On: 06/24/2017 23:26   Ct Angio Chest Pe W And/or Wo Contrast  Result Date: 06/25/2017 CLINICAL DATA:  83 y/o F; shortness of breath and elevated D-dimer. History of COPD. EXAM: CT ANGIOGRAPHY CHEST WITH CONTRAST TECHNIQUE: Multidetector CT imaging of the chest was performed using the standard protocol during bolus administration of intravenous contrast. Multiplanar CT image reconstructions and MIPs were obtained to evaluate the vascular anatomy. CONTRAST:  64 cc Isovue 370 COMPARISON:  05/10/2017 CTA of the chest. FINDINGS: Cardiovascular: Stable 4.6 cm ascending thoracic aortic aneurysm. 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 Moderate diffuse calcific atherosclerosis. Normal heart size. No pericardial effusion. Moderate coronary artery calcifications. Satisfactory opacification of the pulmonary arteries. Mild respiratory motion artifact predominantly in the lung bases. No pulmonary embolus is identified. Mediastinum/Nodes: Mild heterogeneity with coarse calcifications in left lobe of thyroid. Lungs/Pleura: Moderate to severe centrilobular emphysema. Diffuse peribronchial thickening and scattered mucous plugging in the lung bases. Mild predominantly basilar cylindrical bronchiectasis. Platelike atelectasis within the  left upper lobe lingula. Upper Abdomen: No acute abnormality. Musculoskeletal: No chest wall abnormality. No acute or significant osseous findings. Review of the MIP images confirms the above findings. IMPRESSION: 1. No pulmonary embolus identified. 2. Stable 4.6 cm ascending thoracic aortic aneurysm. Ascending thoracic aortic aneurysm. Recommend semi-annual imaging followup by CTA or MRA and referral to cardiothoracic surgery if not already obtained. This recommendation follows 2010 ACCF/AHA/AATS/ACR/ASA/SCA/SCAI/SIR/STS/SVM Guidelines for the Diagnosis and Management of Patients With Thoracic Aortic Disease. Circulation. 2010; 121: Z610-R604 3. Stable penetrating ulcer arising from the left lateral wall of the aortic arch. 4. Stable moderate to severe centrilobular emphysema of the lungs. 5. Diffuse peribronchial thickening and scattered mucous plugging in the lung bases is compatible with acute bronchitis. 6. No consolidation to suggest pneumonia. Electronically Signed   By: Mitzi Hansen M.D.   On: 06/25/2017 03:13    EKG: Independently reviewed. Sinus rhythm without ST-T changes    Time spent:60 minutes Code Status:   FULL Family Communication:  No Family at bedside Disposition Plan: expect 2-3 day hospitalization Consults called: cardiology DVT Prophylaxis: [place] Lovenox  Gryphon Vanderveen, DO  [facility] Pager [phone]  If 7PM-7AM, please contact night-coverage [email or web address] Password [facility] 06/25/2017, 7:47 AM

## 2017-06-25 NOTE — Progress Notes (Signed)
*  PRELIMINARY RESULTS* Echocardiogram 2D Echocardiogram has been performed.  Stacey Drain 06/25/2017, 2:21 PM

## 2017-06-25 NOTE — Consult Note (Signed)
Cardiology Consultation:   Patient ID: Melanie Lowe; [id number]; [birth year]   Admit date: 06/24/2017 Date of Consult: 06/25/2017  Primary Care Provider: Kari Baars, MD Primary Cardiologist: Formerly Dr. Dietrich Pates  Primary Electrophysiologist:  NA   Patient Profile:   Melanie Lowe is a 83 y.o. female with a hx of O2 dependent COPD, Chronic Diastolic CHF, CKD Stage III, and thoracic aneurysm, who is being seen today for the evaluation of chest pain and dyspnea at the request of Dr. Arbutus Leas, Hospitalist.  History of Present Illness:   Melanie Lowe with above mentioned history presented to the ER with complaints of dyspnea, She states that she was in her usual state of health when she walked out on her front porch to visit with a friend without her O2. When she returned to her house and put her O2 back on, she felt a coldness in the middle of her chest, had worsening dyspnea, and began to cough. She felt the room spinning. Son, who lives with her insisted on bringing her to the ER.   Other systems include dysphagia with food sticking in her throat and trouble swallowing. She states this is been going on for a while and is not new. She had been suggested to see a GI specialist by primary care but this has not yet been planned.  On arrival to the ER, BP 97/72 HR 74, O2 Sat 92%. Pertinent labs: Creatinine 1.41, Hgb 10.9, Hct 32.7. D-dimer 3.45, BNP 123. CT scan of the chest revealed stable 4.6 cm ascending thoracic aortic aneurysm, with stable penetrating ulcer arising from the lateral wall of the aortic arch, mucus plugging compatible with acute bronchitis. There was noted moderate coronary artery calcifications noted. EKG revealed NSR with 1 degree AV block. Troponin negative X 2.   She was treated with ASA, 324 mg, morphine 4 mg, duoneb tx, and decadron. She is feeling and breathing much better. She denies recurrent chest pain.  Past Medical History:  Diagnosis Date  . AAA (abdominal aortic  aneurysm) (HCC)   . Anxiety   . Arthritis   . CHF (congestive heart failure) (HCC) 11/09/2015  . COPD (chronic obstructive pulmonary disease) (HCC)   . Depression   . Hypertension   . Leg swelling   . Myocardial infarction (HCC)   . Neuropathy   . Oxygen dependent   . Pneumonia   . Stroke [facility])     Past Surgical History:  Procedure Laterality Date  . APPENDECTOMY    . CHOLECYSTECTOMY       Inpatient Medications: Scheduled Meds: . ALPRAZolam  1 mg Oral TID  . aspirin EC  81 mg Oral Daily  . budesonide (PULMICORT) nebulizer solution  0.25 mg Nebulization BID  . citalopram  20 mg Oral QHS  . enoxaparin (LOVENOX) injection  30 mg Subcutaneous Q24H  . gabapentin  100 mg Oral 5 X Daily  . ipratropium-albuterol  3 mL Nebulization Q6H  . isosorbide mononitrate  30 mg Oral Daily  . methylPREDNISolone (SOLU-MEDROL) injection  40 mg Intravenous Q8H  . sodium chloride flush  3 mL Intravenous Q12H  . torsemide  20 mg Oral BID   Continuous Infusions:  PRN Meds: acetaminophen **OR** acetaminophen, albuterol, ondansetron **OR** ondansetron (ZOFRAN) IV, oxyCODONE-acetaminophen **AND** oxyCODONE  Allergies:    Allergies  Allergen Reactions  . Ciprofloxacin Hcl Nausea And Vomiting  . Nsaids Other (See Comments)    Advised by MD not to take this class of medication.  Pt is able to  take coated Aspirin.    . Other Other (See Comments)    Spices trigger acid reflux.   . Sulfa Antibiotics Nausea And Vomiting  . Sulfur Nausea And Vomiting  . Tomato Other (See Comments)    Reaction:  Acid reflux    Social History:   Social History   Social History  . Marital status: Widowed    Spouse name: N/A  . Number of children: N/A  . Years of education: N/A   Occupational History  . Not on file.   Social History Main Topics  . Smoking status: Former Smoker    Years: 50.00    Types: Cigarettes    Quit date: 12/31/2010  . Smokeless tobacco: Former Neurosurgeon    Quit date: 12/31/2010  . Alcohol  use No  . Drug use: No  . Sexual activity: No   Other Topics Concern  . Not on file   Social History Narrative  . No narrative on file    Family History:   The patient's family history includes Cancer in her brother, father, and sister; Diabetes in her brother; Heart attack in her brother; Heart disease in her brother.  ROS:  Please see the history of present illness.  ROS  All other ROS reviewed and negative.     Physical Exam/Data:   Vitals:   06/25/17 0600 06/25/17 0620 06/25/17 0649 06/25/17 0902  BP: 98/62  122/73   Pulse: (!) 48  (!) 54   Resp: 10  14   Temp:   97.9 F (36.6 C)   TempSrc:   Oral   SpO2: 97% 98% 98% 97%  Weight:   126 lb 6.4 oz (57.3 kg)   Height:   5\' 1"  (1.549 m)    No intake or output data in the 24 hours ending 06/25/17 0905 Filed Weights   06/24/17 2253 06/25/17 0649  Weight: 127 lb (57.6 kg) 126 lb 6.4 oz (57.3 kg)   Body mass index is 23.88 kg/m.  General:  Well nourished, well developed, in no acute distress HEENT: normal Lymph: no adenopathy Neck: no JVD Endocrine:  No thryomegaly Vascular: No carotid bruits; FA pulses 2+ bilaterally without bruits  Cardiac:  normal S1, S2; RRR; no murmur, bradycardic Lungs:  Bilateral rhonchi and scattered crackles in the base.  Abd: soft, nontender, no hepatomegaly  Ext: no edema Musculoskeletal:  No deformities, BUE and BLE strength normal and equal Skin: warm and dry  Neuro:  CNs 2-12 intact, no focal abnormalities noted Psych:  Normal affect   EKG:  The EKG was personally reviewed and demonstrates: Sinus bradycardia with 1st degree AV block.  Telemetry:  Telemetry was personally reviewed and demonstrates: Sinus bradycardia in the 40-50's with 1st degree block.  Relevant CV Studies: Echocardiogram 06/07/2016 Left ventricle: The cavity size was normal. Wall thickness was   increased in a pattern of moderate LVH. Systolic function was   vigorous. The estimated ejection fraction was in the  range of 65%   to 70%. Wall motion was normal; there were no regional wall   motion abnormalities. Doppler parameters are consistent with   abnormal left ventricular relaxation (grade 1 diastolic   dysfunction). Indeterminate filling pressures. - Aortic valve: There was mild regurgitation. - Aorta: Moderate aortic root enlargement. Aortic root dimension:   48 mm (ED). - Left atrium: The atrium was mildly dilated. - Tricuspid valve: There was mild regurgitation. - Pulmonary arteries: Systolic pressure was mildly increased. PA   peak pressure: 38 mm Hg (  S).  Stress MPI 08/03/2011 IMPRESSION: Overall low risk Lexiscan Myoview as outlined.  No diagnostic ST- segment changes were noted, no arrhythmias seen.  Perfusion imaging is most consistent with soft tissue attenuation affecting the anteroseptal wall, without clear evidence of ischemia.  LVEF is normal 62%.  Laboratory Data:  Chemistry Recent Labs Lab 06/24/17 2333  NA 140  K 4.2  CL 105  CO2 28  GLUCOSE 107*  BUN 39*  CREATININE 1.41*  CALCIUM 9.1  GFRNONAA 32*  GFRAA 37*  ANIONGAP 7     Cardiac Enzymes Recent Labs Lab 06/24/17 2333  TROPONINI <0.03   BNP Recent Labs Lab 06/24/17 2333  BNP 123.0*    DDimer  Recent Labs Lab 06/24/17 2333  DDIMER 3.45*    Radiology/Studies:  Dg Chest 2 View  Result Date: 06/24/2017 CLINICAL DATA:  Left-sided chest pain and dyspnea, onset tonight. Several days history of productive cough. EXAM: CHEST  2 VIEW COMPARISON:  02/10/2017, 05/10/17. FINDINGS: Marked aortic tortuosity, unchanged. Mild cardiomegaly, unchanged. Stable chronic scarring bilaterally. No airspace consolidation. No effusion. Normal pulmonary vasculature. Chronic arthropathic changes of both shoulders. IMPRESSION: Stable cardiomegaly and aortic tortuosity. No consolidation or effusion. Electronically Signed   By: Ellery Plunk M.D.   On: 06/24/2017 23:26   Ct Angio Chest Pe W And/or Wo Contrast  Result  Date: 06/25/2017 CLINICAL DATA:  83 y/o F; shortness of breath and elevated D-dimer. History of COPD. EXAM: CT ANGIOGRAPHY CHEST WITH CONTRAST TECHNIQUE: Multidetector CT imaging of the chest was performed using the standard protocol during bolus administration of intravenous contrast. Multiplanar CT image reconstructions and MIPs were obtained to evaluate the vascular anatomy. CONTRAST:  64 cc Isovue 370 COMPARISON:  05/10/2017 CTA of the chest. FINDINGS: Cardiovascular: Stable 4.6 cm ascending thoracic aortic aneurysm. 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 Moderate diffuse calcific atherosclerosis. Normal heart size. No pericardial effusion. Moderate coronary artery calcifications. Satisfactory opacification of the pulmonary arteries. Mild respiratory motion artifact predominantly in the lung bases. No pulmonary embolus is identified. Mediastinum/Nodes: Mild heterogeneity with coarse calcifications in left lobe of thyroid. Lungs/Pleura: Moderate to severe centrilobular emphysema. Diffuse peribronchial thickening and scattered mucous plugging in the lung bases. Mild predominantly basilar cylindrical bronchiectasis. Platelike atelectasis within the left upper lobe lingula. Upper Abdomen: No acute abnormality. Musculoskeletal: No chest wall abnormality. No acute or significant osseous findings. Review of the MIP images confirms the above findings. IMPRESSION: 1. No pulmonary embolus identified. 2. Stable 4.6 cm ascending thoracic aortic aneurysm. Ascending thoracic aortic aneurysm. Recommend semi-annual imaging followup by CTA or MRA and referral to cardiothoracic surgery if not already obtained. This recommendation follows 2010 ACCF/AHA/AATS/ACR/ASA/SCA/SCAI/SIR/STS/SVM Guidelines for the Diagnosis and Management of Patients With Thoracic Aortic Disease. Circulation. 2010; 121: Z610-R604 3. Stable penetrating ulcer arising from the left lateral wall of  the aortic arch. 4. Stable moderate to severe centrilobular emphysema of the lungs. 5. Diffuse peribronchial thickening and scattered mucous plugging in the lung bases is compatible with acute bronchitis. 6. No consolidation to suggest pneumonia. Electronically Signed   By: Mitzi Hansen M.D.   On: 06/25/2017 03:13    Assessment and Plan:   1. Dyspnea: Multifactorial. Patient has known history of oxygen dependent COPD, CT scan revealed bronchitis with mucous plugging. She is breathing much better now that she is receiving nebulizer treatments, and steroids. "Cold feeling" has resolved in the middle of her chest. She denies chest pain. Doubt related to cardiac  etiology. Most recent echocardiogram in 2017 revealed normal LV systolic function with grade 1 diastolic dysfunction. Can repeat this for changes in LV function which may be contributing to breathing status.  2. Thoracic aortic aneurysm: CT scan reveals stable breath again aortic aneurysm was 4.6 cm size unchanged. She was noted to have a penetrating ulcer arising from the left lateral wall of the aortic arch. The patient was not found to be a surgical candidate in the past. Continue medical management.  3. Bradycardia: Noted to have first-degree AV block with rates in the 50s on telemetry. She is not on AV nodal blocking agents at this time. She does complain of some chronic dizziness. No obvious positive for seen on telemetry. Can consider outpatient cardiac monitor if this persists. Check TSH.  4. Chronic diastolic heart failure: No evidence of decompensation at this time. Creatinine 1.41 on torsemide at home. This is been continued at 20 mg twice a day. May consider decreasing to once a day. Blood pressure is low normal, with one recorded hypotensive reading.  5. Anemia: Hemoglobin 10.9 hematocrit 32.7. She denies melena or hemoptysis. Consider iron studies.  6. History of chronic kidney disease stage II: Review of creatinine since  September 2017 reveals slowly rising creatinine from 1.19 and September 2017 to 1.41 this admission. GFR 32-35.   7. Complaints of dysphagia: States she has trouble swallowing food in her throat, feels as if it gets getting stuck before she can swallow at all the way down. Primary care physician did suggest GI evaluation with possible EGD. This is not been planned yet as an outpatient. Can consider GI evaluation while inpatient.   Signed, Melanie Reining, NP  06/25/2017 9:05 AM   Attending note Patient seen and discussed with DNP Lyman Bishop, I agree with her documetnation above. History of COPD on home O2, chronic diastolic HF, CVA,, AAA not a surgical candidate, thoracic aneurysm, admitted with SOB and chest pain.   Cr 1.41 (baseline 1.15) BUN 39 D-dimer 3.45 BNP 123  Trop neg x 2 CXR no acute process CT PE no PE, 4.6 ascedning thoracic aneurysm Echo 05/2016 LVEF 65-70%, no WMAs, grade I diastolci dysfunction, moderately dilated aortic root  COPD exacerbation per primary team. No objective evidence of cardiac ischemia. F/u echo results. I would not pursue ischemic testing on her given her advanced age and comorbidities, particularly in the absence of objective evidence of ischemia. She has already elected not to pursue intervention on a 8 cm AAA. Will try increasing imdur to 45mg  daily for additonal antianginal benefits. Appears euvolemic, would continue home oral diuretics.    Dina Rich MD

## 2017-06-25 NOTE — ED Notes (Signed)
Checked pulse while  sitting before start of ambulating 94,while walking drop to 88

## 2017-06-26 LAB — BASIC METABOLIC PANEL
Anion gap: 8 (ref 5–15)
BUN: 39 mg/dL — AB (ref 6–20)
CALCIUM: 9.4 mg/dL (ref 8.9–10.3)
CO2: 25 mmol/L (ref 22–32)
CREATININE: 1.3 mg/dL — AB (ref 0.44–1.00)
Chloride: 102 mmol/L (ref 101–111)
GFR calc Af Amer: 40 mL/min — ABNORMAL LOW (ref >60)
GFR calc non Af Amer: 35 mL/min — ABNORMAL LOW (ref >60)
GLUCOSE: 209 mg/dL — AB (ref 65–99)
Potassium: 4.8 mmol/L (ref 3.5–5.1)
Sodium: 135 mmol/L (ref 135–145)

## 2017-06-26 LAB — HEMOGLOBIN A1C
Hgb A1c MFr Bld: 5.6 % (ref 4.8–5.6)
MEAN PLASMA GLUCOSE: 114 mg/dL

## 2017-06-26 NOTE — Evaluation (Signed)
Physical Therapy Evaluation Patient Details Name: Melanie Lowe MRN: [id number] DOB: Oct 05, 1924 Today's Date: 06/26/2017   History of Present Illness  Melanie Lowe is a 83yo white female who comes to ED after onset of SOB and CP. Pt admitted with COPD exacerbation. PMH: COPD, GAD, DCHF, HTN, CKD3, CAD, CRF on 3LPM baseline, AAA, MI, CVA, and 1st degree heart block.   Clinical Impression  Pt admitted with above diagnosis. Pt currently with functional limitations due to the deficits listed below (see "PT Problem List"). Upon entry, the patient is received semirecumbent in bed, no family/caregiver present.Pt is enthusiastic to participate and proud to AMB at her age. She is very weak upon attempting to stand, requiring physical assistance for lift and steady, and remains with posterior lean throughout, unable to obtain balance even after verbal cues. AMB is then trialed with RW, again with significant posterior lean, constant retropulsion requring moderate assist to avoid fall to floor, especially during turns. Of concern, the patient has poor awareness of LOB and low level falls anxiety. Curiously, she reports independent AMB at baseline without any falls history, which is difficult to believe given her current balance impairment, admitting diagnosis, and length of stay. She will benefit from extensive rehab to return to her PLOF, but is highly motivated. She will benefit from a RW for stability, but will be unsafe without physical assistance for all AMB going forward until balance improves. Pt will benefit from skilled PT intervention to increase independence and safety with basic mobility in preparation for discharge to the venue listed below.       Follow Up Recommendations SNF    Equipment Recommendations  Rolling walker with 5" wheels (pt reports that hers is missing. )    Recommendations for Other Services       Precautions / Restrictions Precautions Precautions: Fall Restrictions Weight  Bearing Restrictions: No      Mobility  Bed Mobility Overal bed mobility: Modified Independent                Transfers Overall transfer level: Needs assistance Equipment used: Rolling walker (2 wheeled) Transfers: Sit to/from Stand Sit to Stand: Min assist         General transfer comment: sustained retropulsion   Ambulation/Gait Ambulation/Gait assistance: Mod assist Ambulation Distance (Feet): 25 Feet Assistive device: Rolling walker (2 wheeled)     Gait velocity interpretation: <1.8 ft/sec, indicative of risk for recurrent falls General Gait Details: sustained retropulsion, RW too far out front for safe use, poor awareness of LOB with inability to correctl; VSS   Stairs            Wheelchair Mobility    Modified Rankin (Stroke Patients Only)       Balance Overall balance assessment: Needs assistance         Standing balance support: During functional activity;Bilateral upper extremity supported Standing balance-Leahy Scale: Zero Standing balance comment: very unsteay wth turns; constant retropulsion                             Pertinent Vitals/Pain Pain Assessment: No/denies pain    Home Living Family/patient expects to be discharged to:: Private residence Living Arrangements: Children Available Help at Discharge: Available 24 hours/day (Son is on disability. ) Type of Home: Apartment (Son's house ) Home Access: Level entry     Home Layout: One level Home Equipment: Environmental consultant - 2 wheels;Shower seat (pt reports she hasn't had a [employer]  she moved, and no one will get it for her. )      Prior Function Level of Independence: Independent         Comments: uses home O2; limited community distances with shopping buggy 1x weekly      Hand Dominance        Extremity/Trunk Assessment   Upper Extremity Assessment Upper Extremity Assessment: Generalized weakness    Lower Extremity Assessment Lower Extremity Assessment:  Generalized weakness       Communication   Communication: No difficulties  Cognition Arousal/Alertness: Awake/alert Behavior During Therapy: WFL for tasks assessed/performed Overall Cognitive Status: No family/caregiver present to determine baseline cognitive functioning (mild impulsivity )                                 General Comments: home set up infor is different from prior admission .       General Comments      Exercises     Assessment/Plan    PT Assessment Patient needs continued PT services  PT Problem List Decreased balance;Decreased mobility;Decreased activity tolerance;Decreased strength;Decreased safety awareness;Decreased knowledge of use of DME       PT Treatment Interventions DME instruction;Gait training;Functional mobility training;Therapeutic activities;Therapeutic exercise;Balance training;Neuromuscular re-education;Cognitive remediation;Patient/family education    PT Goals (Current goals can be found in the Care Plan section)  Acute Rehab PT Goals Patient Stated Goal: regain mobility, remain independent, return to home  PT Goal Formulation: With patient Time For Goal Achievement: 07/10/17 Potential to Achieve Goals: Fair    Frequency Min 2X/week   Barriers to discharge Inaccessible home environment      Co-evaluation               AM-PAC PT "6 Clicks" Daily Activity  Outcome Measure Difficulty turning over in bed (including adjusting bedclothes, sheets and blankets)?: A Little Difficulty moving from lying on back to sitting on the side of the bed? : A Little Difficulty sitting down on and standing up from a chair with arms (e.g., wheelchair, bedside commode, etc,.)?: Total Help needed moving to and from a bed to chair (including a wheelchair)?: Total Help needed walking in hospital room?: Total Help needed climbing 3-5 steps with a railing? : Total 6 Click Score: 10    End of Session Equipment Utilized During  Treatment: Gait belt;Oxygen Activity Tolerance: Patient tolerated treatment well Patient left: in chair;with call bell/phone within reach;with chair alarm set Nurse Communication: Mobility status PT Visit Diagnosis: Unsteadiness on feet (R26.81);Difficulty in walking, not elsewhere classified (R26.2)    Time: 6440-3474 PT Time Calculation (min) (ACUTE ONLY): 27 min   Charges:   PT Evaluation $PT Eval Moderate Complexity: 1 Procedure PT Treatments $Therapeutic Activity: 8-22 mins   PT G Codes:        4:05 PM, 07-22-17 Rosamaria Lints, PT, DPT Physical Therapist - [place] [phone] (Office)    Dewell Monnier C 2017-07-22, 4:00 PM

## 2017-06-26 NOTE — Progress Notes (Signed)
Progress Note  Patient Name: Melanie Lowe Date of Encounter: 06/26/2017  Primary Cardiologist: previous Dr. Dietrich Pates  Subjective   "Breathing could be better"  Inpatient Medications    Scheduled Meds: . ALPRAZolam  1 mg Oral TID  . aspirin EC  81 mg Oral Daily  . budesonide (PULMICORT) nebulizer solution  0.25 mg Nebulization BID  . citalopram  20 mg Oral QHS  . enoxaparin (LOVENOX) injection  30 mg Subcutaneous Q24H  . gabapentin  100 mg Oral 5 X Daily  . ipratropium-albuterol  3 mL Nebulization Q6H  . isosorbide mononitrate  45 mg Oral Daily  . methylPREDNISolone (SOLU-MEDROL) injection  40 mg Intravenous Q8H  . sodium chloride flush  3 mL Intravenous Q12H  . torsemide  20 mg Oral Daily   Continuous Infusions:  PRN Meds: acetaminophen **OR** acetaminophen, ondansetron **OR** ondansetron (ZOFRAN) IV, oxyCODONE-acetaminophen **AND** oxyCODONE   Vital Signs    Vitals:   06/25/17 2302 06/26/17 0410 06/26/17 0748 06/26/17 0755  BP: (!) 142/87 120/82    Pulse: 75 64    Resp: 18 18    Temp: 97.6 F (36.4 C)     TempSrc: Oral     SpO2: 97% 97% 99% 100%  Weight:  130 lb 11.2 oz (59.3 kg)    Height:        Intake/Output Summary (Last 24 hours) at 06/26/17 1034 Last data filed at 06/26/17 0900  Gross per 24 hour  Intake              480 ml  Output                0 ml  Net              480 ml   Filed Weights   06/24/17 2253 06/25/17 0649 06/26/17 0410  Weight: 127 lb (57.6 kg) 126 lb 6.4 oz (57.3 kg) 130 lb 11.2 oz (59.3 kg)    Telemetry    SB-NSR in 50's - Personally Reviewed  ECG       Physical Exam    GEN: No acute distress.   Neck: No JVD Cardiac: RRR, 2/6 sys murmur LSB Respiratory: Decreased BS with scattered crackles GI: Soft, nontender, non-distended  MS: No edema; No deformity. Neuro:  Nonfocal  Psych: Normal affect   Labs    Chemistry Recent Labs Lab 06/24/17 2333 06/26/17 0452  NA 140 135  K 4.2 4.8  CL 105 102  CO2 28 25    GLUCOSE 107* 209*  BUN 39* 39*  CREATININE 1.41* 1.30*  CALCIUM 9.1 9.4  GFRNONAA 32* 35*  GFRAA 37* 40*  ANIONGAP 7 8     Hematology Recent Labs Lab 06/24/17 2333  WBC 4.9  RBC 3.45*  HGB 10.9*  HCT 32.7*  MCV 94.8  MCH 31.6  MCHC 33.3  RDW 13.1  PLT 198    Cardiac Enzymes Recent Labs Lab 06/24/17 2333 06/25/17 0807 06/25/17 1410  TROPONINI <0.03 <0.03 <0.03   No results for input(s): TROPIPOC in the last 168 hours.   BNP Recent Labs Lab 06/24/17 2333  BNP 123.0*     DDimer  Recent Labs Lab 06/24/17 2333  DDIMER 3.45*     Radiology    Dg Chest 2 View  Result Date: 06/24/2017 CLINICAL DATA:  Left-sided chest pain and dyspnea, onset tonight. Several days history of productive cough. EXAM: CHEST  2 VIEW COMPARISON:  02/10/2017, 05/10/17. FINDINGS: Marked aortic tortuosity, unchanged. Mild cardiomegaly, unchanged. Stable  chronic scarring bilaterally. No airspace consolidation. No effusion. Normal pulmonary vasculature. Chronic arthropathic changes of both shoulders. IMPRESSION: Stable cardiomegaly and aortic tortuosity. No consolidation or effusion. Electronically Signed   By: Ellery Plunk M.D.   On: 06/24/2017 23:26   Ct Angio Chest Pe W And/or Wo Contrast  Result Date: 06/25/2017 CLINICAL DATA:  83 y/o F; shortness of breath and elevated D-dimer. History of COPD. EXAM: CT ANGIOGRAPHY CHEST WITH CONTRAST TECHNIQUE: Multidetector CT imaging of the chest was performed using the standard protocol during bolus administration of intravenous contrast. Multiplanar CT image reconstructions and MIPs were obtained to evaluate the vascular anatomy. CONTRAST:  64 cc Isovue 370 COMPARISON:  05/10/2017 CTA of the chest. FINDINGS: Cardiovascular: Stable 4.6 cm ascending thoracic aortic aneurysm. 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 Moderate diffuse calcific atherosclerosis. Normal heart size. No pericardial  effusion. Moderate coronary artery calcifications. Satisfactory opacification of the pulmonary arteries. Mild respiratory motion artifact predominantly in the lung bases. No pulmonary embolus is identified. Mediastinum/Nodes: Mild heterogeneity with coarse calcifications in left lobe of thyroid. Lungs/Pleura: Moderate to severe centrilobular emphysema. Diffuse peribronchial thickening and scattered mucous plugging in the lung bases. Mild predominantly basilar cylindrical bronchiectasis. Platelike atelectasis within the left upper lobe lingula. Upper Abdomen: No acute abnormality. Musculoskeletal: No chest wall abnormality. No acute or significant osseous findings. Review of the MIP images confirms the above findings. IMPRESSION: 1. No pulmonary embolus identified. 2. Stable 4.6 cm ascending thoracic aortic aneurysm. Ascending thoracic aortic aneurysm. Recommend semi-annual imaging followup by CTA or MRA and referral to cardiothoracic surgery if not already obtained. This recommendation follows 2010 ACCF/AHA/AATS/ACR/ASA/SCA/SCAI/SIR/STS/SVM Guidelines for the Diagnosis and Management of Patients With Thoracic Aortic Disease. Circulation. 2010; 121: R173-V670 3. Stable penetrating ulcer arising from the left lateral wall of the aortic arch. 4. Stable moderate to severe centrilobular emphysema of the lungs. 5. Diffuse peribronchial thickening and scattered mucous plugging in the lung bases is compatible with acute bronchitis. 6. No consolidation to suggest pneumonia. Electronically Signed   By: Mitzi Hansen M.D.   On: 06/25/2017 03:13    Cardiac Studies   EchStudy Conclusions   - Left ventricle: The cavity size was normal. Wall thickness was   increased in a pattern of moderate LVH. Systolic function was   normal. The estimated ejection fraction was in the range of 60%   to 65%. Wall motion was normal; there were no regional wall   motion abnormalities. Doppler parameters are consistent with    abnormal left ventricular relaxation (grade 1 diastolic   dysfunction). LA pressure is indeterminant. - Aortic valve: Mildly to moderately calcified annulus. Trileaflet;   mildly thickened leaflets. There was moderate regurgitation.   Valve area (VTI): 2.21 cm^2. Valve area (Vmax): 2.26 cm^2. Valve   area (Vmean): 2.4 cm^2. - Mitral valve: Mildly calcified annulus. Mildly thickened leaflets   . - Atrial septum: No defect or patent foramen ovale was identified. - Pulmonary arteries: Systolic pressure was mildly increased. PA   peak pressure: 35 mm Hg (S). - Systemic veins: IVC is dilated with normal respiratory variation,   estimated RA pressure is 8 mmHg. - Technically adequate study. o 06/25/17   Patient Profile   Melanie Lowe is a 83 y.o. female with a hx of O2 dependent COPD, Chronic Diastolic CHF, CKD Stage III, and thoracic aneurysm, who is being seen for the evaluation of chest pain and dyspnea at the request of Dr. Arbutus Leas,  Hospitalist   Assessment & Plan    Chest pain resolved.Troponins negative. Normal LVEF on echo. Imdur increased to 45 mg daily. No invasive w/u indicated given age and comorbities.    Dyspnea: Multifactorial. O2 COPD, CT scan revealed bronchitis with mucous plugging,chronic diastolic CHF.     Thoracic aortic aneurysm: CT scan reveals stable breath again aortic aneurysm was 4.6 cm size unchanged. She was noted to have a penetrating ulcer arising from the left lateral wall of the aortic arch. The patient was not found to be a surgical candidate in the past. Continue medical management.    Bradycardia: Noted to have first-degree AV block with rates in the 50s on telemetry. She is not on AV nodal blocking agents at this time.       Chronic diastolic heart failure: No evidence of decompensation at this time. Creatinine 1.3 on torsemide at home.       Anemia: Hemoglobin 10.9 hematocrit 32.7. She denies melena or hemoptysis.      chronic kidney disease stage II:   stable crt 1.3      Signed, Jacolyn Reedy, PA-C  06/26/2017, 10:34 AM    Patient seen and discussed with PA Geni Bers, I agree with her documentation. Chest pain improved this morning, her imdur was increased to 45mg  daily. No plans for ischemic evaluation at this time. Echo shows stable LVEF 60-65%, grade I diastolic dysfunction, mod AI. Does not appear volume overloaded at this time. No plans for further cardiac testing or interventions at this time. CT scan consistent with bronchitis, she also has a history of COPD, management per primary team. We will sign off inpatient care.   Dominga Ferry MD

## 2017-06-26 NOTE — Care Management Note (Signed)
Case Management Note  Patient Details  Name: Melanie Lowe MRN: [id number] Date of Birth: Jun 09, 1924  Subjective/Objective:                  Pt from home, lives with son and DIL. Pt has is ind with ADL's. Pt ambulates ind of assistive devices or with a walker prn. Pt has home O2, neb machine pta. Pt was recently DC'd from [facility] services. Pt has has HH pt in the past but it was not found to be useful, family has exercises to use if needed. Family communicates no needs.   Action/Plan: anticipate DC home with self care tomorrow. No CM needs anticipated. Will follow to DC.   Expected Discharge Date:    06/27/2017              Expected Discharge Plan:  Home/Self Care  In-House Referral:  NA  Discharge planning Services  CM Consult  Post Acute Care Choice:  NA Choice offered to:  NA  Status of Service:  In process, will continue to follow  Malcolm Metro, RN 06/26/2017, 1:54 PM

## 2017-06-26 NOTE — Progress Notes (Signed)
Subjective: She says she feels better. No more chest pain. Her breathing is about the same. No other new complaints. Cardiology note seen and appreciated  Objective: Vital signs in last 24 hours: Temp:  [97.6 F (36.4 C)-98.1 F (36.7 C)] 97.6 F (36.4 C) (06/26 2302) Pulse Rate:  [64-75] 64 (06/27 0410) Resp:  [18] 18 (06/27 0410) BP: (99-142)/(61-87) 120/82 (06/27 0410) SpO2:  [84 %-100 %] 100 % (06/27 0755) Weight:  [59.3 kg (130 lb 11.2 oz)] 59.3 kg (130 lb 11.2 oz) (06/27 0410) Weight change: 1.678 kg (3 lb 11.2 oz) Last BM Date: 06/24/17  Intake/Output from previous day: 06/26 0701 - 06/27 0700 In: 360 [P.O.:360] Out: -   PHYSICAL EXAM General appearance: alert, cooperative and no distress Resp: rhonchi bilaterally Cardio: regular rate and rhythm, S1, S2 normal, no murmur, click, rub or gallop GI: soft, non-tender; bowel sounds normal; no masses,  no organomegaly Extremities: extremities normal, atraumatic, no cyanosis or edema Skin warm and dry  Lab Results:  Results for orders placed or performed during the hospital encounter of 06/24/17 (from the past 48 hour(s))  Basic metabolic panel     Status: Abnormal   Collection Time: 06/24/17 11:33 PM  Result Value Ref Range   Sodium 140 135 - 145 mmol/L   Potassium 4.2 3.5 - 5.1 mmol/L   Chloride 105 101 - 111 mmol/L   CO2 28 22 - 32 mmol/L   Glucose, Bld 107 (H) 65 - 99 mg/dL   BUN 39 (H) 6 - 20 mg/dL   Creatinine, Ser 1.41 (H) 0.44 - 1.00 mg/dL   Calcium 9.1 8.9 - 10.3 mg/dL   GFR calc non Af Amer 32 (L) >60 mL/min   GFR calc Af Amer 37 (L) >60 mL/min    Comment: (NOTE) The eGFR has been calculated using the CKD EPI equation. This calculation has not been validated in all clinical situations. eGFR's persistently <60 mL/min signify possible Chronic Kidney Disease.    Anion gap 7 5 - 15  CBC     Status: Abnormal   Collection Time: 06/24/17 11:33 PM  Result Value Ref Range   WBC 4.9 4.0 - 10.5 K/uL   RBC 3.45  (L) 3.87 - 5.11 MIL/uL   Hemoglobin 10.9 (L) 12.0 - 15.0 g/dL   HCT 32.7 (L) 36.0 - 46.0 %   MCV 94.8 78.0 - 100.0 fL   MCH 31.6 26.0 - 34.0 pg   MCHC 33.3 30.0 - 36.0 g/dL   RDW 13.1 11.5 - 15.5 %   Platelets 198 150 - 400 K/uL  Troponin I     Status: None   Collection Time: 06/24/17 11:33 PM  Result Value Ref Range   Troponin I <0.03 <0.03 ng/mL  Differential     Status: None   Collection Time: 06/24/17 11:33 PM  Result Value Ref Range   Neutrophils Relative % 54 %   Neutro Abs 2.6 1.7 - 7.7 K/uL   Lymphocytes Relative 30 %   Lymphs Abs 1.5 0.7 - 4.0 K/uL   Monocytes Relative 11 %   Monocytes Absolute 0.5 0.1 - 1.0 K/uL   Eosinophils Relative 5 %   Eosinophils Absolute 0.3 0.0 - 0.7 K/uL   Basophils Relative 0 %   Basophils Absolute 0.0 0.0 - 0.1 K/uL  D-dimer, quantitative (not at [facility])     Status: Abnormal   Collection Time: 06/24/17 11:33 PM  Result Value Ref Range   D-Dimer, Quant 3.45 (H) 0.00 - 0.50 ug/mL-FEU  Comment: (NOTE) At the manufacturer cut-off of 0.50 ug/mL FEU, this assay has been documented to exclude PE with a sensitivity and negative predictive value of 97 to 99%.  At this time, this assay has not been approved by the FDA to exclude DVT/VTE. Results should be correlated with clinical presentation.   Brain natriuretic peptide     Status: Abnormal   Collection Time: 06/24/17 11:33 PM  Result Value Ref Range   B Natriuretic Peptide 123.0 (H) 0.0 - 100.0 pg/mL  Troponin I     Status: None   Collection Time: 06/25/17  8:07 AM  Result Value Ref Range   Troponin I <0.03 <0.03 ng/mL  Magnesium     Status: Abnormal   Collection Time: 06/25/17  8:07 AM  Result Value Ref Range   Magnesium 2.5 (H) 1.7 - 2.4 mg/dL  Phosphorus     Status: None   Collection Time: 06/25/17  8:07 AM  Result Value Ref Range   Phosphorus 3.9 2.5 - 4.6 mg/dL  Lipid panel     Status: None   Collection Time: 06/25/17  8:07 AM  Result Value Ref Range   Cholesterol 132 0 - 200  mg/dL   Triglycerides 80 <150 mg/dL   HDL 42 >40 mg/dL   Total CHOL/HDL Ratio 3.1 RATIO   VLDL 16 0 - 40 mg/dL   LDL Cholesterol 74 0 - 99 mg/dL    Comment:        Total Cholesterol/HDL:CHD Risk Coronary Heart Disease Risk Table                     Men   Women  1/2 Average Risk   3.4   3.3  Average Risk       5.0   4.4  2 X Average Risk   9.6   7.1  3 X Average Risk  23.4   11.0        Use the calculated Patient Ratio above and the CHD Risk Table to determine the patient's CHD Risk.        ATP III CLASSIFICATION (LDL):  <100     mg/dL   Optimal  100-129  mg/dL   Near or Above                    Optimal  130-159  mg/dL   Borderline  160-189  mg/dL   High  >190     mg/dL   Very High   Hemoglobin A1c     Status: None   Collection Time: 06/25/17  8:07 AM  Result Value Ref Range   Hgb A1c MFr Bld 5.6 4.8 - 5.6 %    Comment: (NOTE)         Pre-diabetes: 5.7 - 6.4         Diabetes: >6.4         Glycemic control for adults with diabetes: <7.0    Mean Plasma Glucose 114 mg/dL    Comment: (NOTE) Performed At: [facility] [place], [place] [id number] Lindon Romp MD [id number]   Troponin I     Status: None   Collection Time: 06/25/17  2:10 PM  Result Value Ref Range   Troponin I <0.03 <0.03 ng/mL  Basic metabolic panel     Status: Abnormal   Collection Time: 06/26/17  4:52 AM  Result Value Ref Range   Sodium 135 135 - 145 mmol/L   Potassium 4.8 3.5 - 5.1  mmol/L   Chloride 102 101 - 111 mmol/L   CO2 25 22 - 32 mmol/L   Glucose, Bld 209 (H) 65 - 99 mg/dL   BUN 39 (H) 6 - 20 mg/dL   Creatinine, Ser 1.30 (H) 0.44 - 1.00 mg/dL   Calcium 9.4 8.9 - 10.3 mg/dL   GFR calc non Af Amer 35 (L) >60 mL/min   GFR calc Af Amer 40 (L) >60 mL/min    Comment: (NOTE) The eGFR has been calculated using the CKD EPI equation. This calculation has not been validated in all clinical situations. eGFR's persistently <60 mL/min signify possible Chronic  Kidney Disease.    Anion gap 8 5 - 15    ABGS No results for input(s): PHART, PO2ART, TCO2, HCO3 in the last 72 hours.  Invalid input(s): PCO2 CULTURES No results found for this or any previous visit (from the past 240 hour(s)). Studies/Results: Dg Chest 2 View  Result Date: 06/24/2017 CLINICAL DATA:  Left-sided chest pain and dyspnea, onset tonight. Several days history of productive cough. EXAM: CHEST  2 VIEW COMPARISON:  02/10/2017, 05/10/17. FINDINGS: Marked aortic tortuosity, unchanged. Mild cardiomegaly, unchanged. Stable chronic scarring bilaterally. No airspace consolidation. No effusion. Normal pulmonary vasculature. Chronic arthropathic changes of both shoulders. IMPRESSION: Stable cardiomegaly and aortic tortuosity. No consolidation or effusion. Electronically Signed   By: Andreas Newport M.D.   On: 06/24/2017 23:26   Ct Angio Chest Pe W And/or Wo Contrast  Result Date: 06/25/2017 CLINICAL DATA:  83 y/o F; shortness of breath and elevated D-dimer. History of COPD. EXAM: CT ANGIOGRAPHY CHEST WITH CONTRAST TECHNIQUE: Multidetector CT imaging of the chest was performed using the standard protocol during bolus administration of intravenous contrast. Multiplanar CT image reconstructions and MIPs were obtained to evaluate the vascular anatomy. CONTRAST:  64 cc Isovue 370 COMPARISON:  05/10/2017 CTA of the chest. FINDINGS: Cardiovascular: Stable 4.6 cm ascending thoracic aortic aneurysm. 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 Moderate diffuse calcific atherosclerosis. Normal heart size. No pericardial effusion. Moderate coronary artery calcifications. Satisfactory opacification of the pulmonary arteries. Mild respiratory motion artifact predominantly in the lung bases. No pulmonary embolus is identified. Mediastinum/Nodes: Mild heterogeneity with coarse calcifications in left lobe of thyroid. Lungs/Pleura: Moderate to severe  centrilobular emphysema. Diffuse peribronchial thickening and scattered mucous plugging in the lung bases. Mild predominantly basilar cylindrical bronchiectasis. Platelike atelectasis within the left upper lobe lingula. Upper Abdomen: No acute abnormality. Musculoskeletal: No chest wall abnormality. No acute or significant osseous findings. Review of the MIP images confirms the above findings. IMPRESSION: 1. No pulmonary embolus identified. 2. Stable 4.6 cm ascending thoracic aortic aneurysm. Ascending thoracic aortic aneurysm. Recommend semi-annual imaging followup by CTA or MRA and referral to cardiothoracic surgery if not already obtained. This recommendation follows 2010 ACCF/AHA/AATS/ACR/ASA/SCA/SCAI/SIR/STS/SVM Guidelines for the Diagnosis and Management of Patients With Thoracic Aortic Disease. Circulation. 2010; 121: V409-W119 3. Stable penetrating ulcer arising from the left lateral wall of the aortic arch. 4. Stable moderate to severe centrilobular emphysema of the lungs. 5. Diffuse peribronchial thickening and scattered mucous plugging in the lung bases is compatible with acute bronchitis. 6. No consolidation to suggest pneumonia. Electronically Signed   By: Kristine Garbe M.D.   On: 06/25/2017 03:13    Medications:  Prior to Admission:  Prescriptions Prior to Admission  Medication Sig Dispense Refill Last Dose  . acetaminophen (TYLENOL) 500 MG tablet Take 250-500 mg by mouth every 6 (six) hours as  needed for mild pain, moderate pain, fever or headache.    Past Week at Unknown time  . albuterol (PROVENTIL) (2.5 MG/3ML) 0.083% nebulizer solution Take 2.5 mg by nebulization every 6 (six) hours as needed for wheezing or shortness of breath.    Past Week at Unknown time  . ALPRAZolam (XANAX) 1 MG tablet Take 1 mg by mouth 3 (three) times daily.   06/24/2017 at Unknown time  . aspirin EC 81 MG tablet Take 81 mg by mouth daily.   06/24/2017 at Unknown time  . budesonide-formoterol (SYMBICORT)  160-4.5 MCG/ACT inhaler Inhale 2 puffs into the lungs 2 (two) times daily.    06/24/2017 at Unknown time  . citalopram (CELEXA) 20 MG tablet Take 20 mg by mouth at bedtime.    06/24/2017 at Unknown time  . gabapentin (NEURONTIN) 100 MG capsule Take 100 mg by mouth 5 (five) times daily.    06/24/2017 at Unknown time  . isosorbide mononitrate (IMDUR) 30 MG 24 hr tablet Take 1 tablet (30 mg total) by mouth daily. 30 tablet 12 Past Week at Unknown time  . nitroGLYCERIN (NITROSTAT) 0.4 MG SL tablet Place 0.4 mg under the tongue every 5 (five) minutes as needed for chest pain.   Past Month at Unknown time  . oxyCODONE (ROXICODONE) 15 MG immediate release tablet Take 1 tablet by mouth 4 (four) times daily.   06/24/2017 at Unknown time  . tiotropium (SPIRIVA) 18 MCG inhalation capsule Place 18 mcg into inhaler and inhale at bedtime.    06/24/2017 at Unknown time  . torsemide (DEMADEX) 20 MG tablet Take 1 tablet (20 mg total) by mouth 2 (two) times daily. 60 tablet 12 Past Week at Unknown time  . traMADol (ULTRAM) 50 MG tablet Take 1 tablet (50 mg total) by mouth every 6 (six) hours as needed. (Patient taking differently: Take 50 mg by mouth every 6 (six) hours as needed for moderate pain. ) 20 tablet 0 06/24/2017 at Unknown time  . cephALEXin (KEFLEX) 500 MG capsule Take 1 capsule (500 mg total) by mouth 2 (two) times daily. (Patient not taking: Reported on 06/25/2017) 20 capsule 0 Completed Course at Unknown time  . oxyCODONE-acetaminophen (PERCOCET) 10-325 MG tablet Take 1 tablet by mouth every 6 (six) hours as needed for pain.    Not Taking at Unknown time   Scheduled: . ALPRAZolam  1 mg Oral TID  . aspirin EC  81 mg Oral Daily  . budesonide (PULMICORT) nebulizer solution  0.25 mg Nebulization BID  . citalopram  20 mg Oral QHS  . enoxaparin (LOVENOX) injection  30 mg Subcutaneous Q24H  . gabapentin  100 mg Oral 5 X Daily  . ipratropium-albuterol  3 mL Nebulization Q6H  . isosorbide mononitrate  45 mg Oral  Daily  . methylPREDNISolone (SOLU-MEDROL) injection  40 mg Intravenous Q8H  . sodium chloride flush  3 mL Intravenous Q12H  . torsemide  20 mg Oral Daily   Continuous:  [id number] **OR** acetaminophen, ondansetron **OR** ondansetron (ZOFRAN) IV, oxyCODONE-acetaminophen **AND** oxyCODONE  Assesment: She was admitted with chest pain. She is known to have multiple cardiac risk factors but she's not felt to be a candidate for any sort of invasive procedure. Dr. Harl Bowie has recommended increasing Imdur which has been done and she is pain-free at this point. She has COPD exacerbation and she is getting better but slowly. She is not quite at baseline. She has chronic hypoxic respiratory failure which is stable. She has an aortic aneurysm and she  is not a surgical candidate. Active Problems:   Essential hypertension   Chronic respiratory failure with hypoxia (HCC)   Failure to thrive in adult   COPD exacerbation (HCC)   CKD (chronic kidney disease) stage 3, GFR 30-59 ml/min   Shortness of breath   Chronic diastolic CHF (congestive heart failure) (HCC)   Chronic pain syndrome   Elevated brain natriuretic peptide (BNP) level    Plan: Continue treatments. I think she'll probably be ready for discharge tomorrow    LOS: 1 day   Najeh Credit L 06/26/2017, 8:57 AM

## 2017-06-27 MED ORDER — AZITHROMYCIN 250 MG PO TABS: ORAL_TABLET | ORAL | 0 refills | Status: AC

## 2017-06-27 MED ORDER — ISOSORBIDE MONONITRATE ER 30 MG PO TB24: 45.0000 mg | ORAL_TABLET | Freq: Every day | ORAL | 12 refills | Status: DC

## 2017-06-27 MED ORDER — PREDNISONE 10 MG (21) PO TBPK: ORAL_TABLET | ORAL | 0 refills | Status: DC

## 2017-06-27 MED ORDER — TORSEMIDE 20 MG PO TABS: 20.0000 mg | ORAL_TABLET | Freq: Every day | ORAL | 12 refills | Status: DC

## 2017-06-27 NOTE — Care Management Important Message (Signed)
Important Message  Patient Details  Name: Melanie Lowe MRN: [id number] Date of Birth: 1924/12/18   Medicare Important Message Given:  Yes    Malcolm Metro, RN 06/27/2017, 9:33 AM

## 2017-06-27 NOTE — Progress Notes (Signed)
Discharge instruction read to patient hand her son.  Both verbalized understanding of all instructions  Discharged to home with son and family friend

## 2017-06-27 NOTE — Care Management Note (Signed)
Case Management Note  Patient Details  Name: Melanie Lowe MRN: [id number] Date of Birth: Dec 07, 1924  Expected Discharge Date:  06/27/17               Expected Discharge Plan:  Home/Self Care  In-House Referral:  NA  Discharge planning Services  CM Consult  Post Acute Care Choice:  NA Choice offered to:  NA  Status of Service:  Completed, signed off   Additional Comments: Pt discharging home today. DC plan discussed at beside with pt and family via phone. PT has recommended SNF. Pt refuses, family supports pt's decision to come home. They are able and willing to be with her 24/7 and provide more support while pt regains strength. Pt/family not interested in [facility] services at this time. Family will pick pt up and bring port O2 tank for transport.   Malcolm Metro, RN 06/27/2017, 9:31 AM

## 2017-06-27 NOTE — Progress Notes (Signed)
Subjective: She says she feels okay. She's been a little confused. I think this is hospital-acquired confusion. Her breathing is doing okay.  Objective: Vital signs in last 24 hours: Temp:  [97 F (36.1 C)-97.6 F (36.4 C)] 97 F (36.1 C) (06/28 0546) Pulse Rate:  [51-86] 51 (06/28 0546) Resp:  [18-20] 20 (06/27 2136) BP: (91-136)/(62-78) 136/78 (06/28 0546) SpO2:  [94 %-100 %] 100 % (06/28 0820) Weight:  [59.6 kg (131 lb 8 oz)] 59.6 kg (131 lb 8 oz) (06/28 0546) Weight change: 0.363 kg (12.8 oz) Last BM Date: 06/24/17  Intake/Output from previous day: 06/27 0701 - 06/28 0700 In: 720 [P.O.:720] Out: -   PHYSICAL EXAM General appearance: alert, cooperative and Minimally confused but easily reoriented Resp: rhonchi bilaterally Cardio: regular rate and rhythm, S1, S2 normal, no murmur, click, rub or gallop GI: soft, non-tender; bowel sounds normal; no masses,  no organomegaly Extremities: extremities normal, atraumatic, no cyanosis or edema Skin warm and dry  Lab Results:  Results for orders placed or performed during the hospital encounter of 06/24/17 (from the past 48 hour(s))  Troponin I     Status: None   Collection Time: 06/25/17  2:10 PM  Result Value Ref Range   Troponin I <0.03 <0.03 ng/mL  Basic metabolic panel     Status: Abnormal   Collection Time: 06/26/17  4:52 AM  Result Value Ref Range   Sodium 135 135 - 145 mmol/Lowe   Potassium 4.8 3.5 - 5.1 mmol/Lowe   Chloride 102 101 - 111 mmol/Lowe   CO2 25 22 - 32 mmol/Lowe   Glucose, Bld 209 (H) 65 - 99 mg/dL   BUN 39 (H) 6 - 20 mg/dL   Creatinine, Ser 1.30 (H) 0.44 - 1.00 mg/dL   Calcium 9.4 8.9 - 10.3 mg/dL   GFR calc non Af Amer 35 (Lowe) >60 mL/min   GFR calc Af Amer 40 (Lowe) >60 mL/min    Comment: (NOTE) The eGFR has been calculated using the CKD EPI equation. This calculation has not been validated in all clinical situations. eGFR's persistently <60 mL/min signify possible Chronic Kidney Disease.    Anion gap 8 5 - 15     ABGS No results for input(s): PHART, PO2ART, TCO2, HCO3 in the last 72 hours.  Invalid input(s): PCO2 CULTURES No results found for this or any previous visit (from the past 240 hour(s)). Studies/Results: No results found.  Medications:  Prior to Admission:  Prescriptions Prior to Admission  Medication Sig Dispense Refill Last Dose  . acetaminophen (TYLENOL) 500 MG tablet Take 250-500 mg by mouth every 6 (six) hours as needed for mild pain, moderate pain, fever or headache.    Past Week at Unknown time  . albuterol (PROVENTIL) (2.5 MG/3ML) 0.083% nebulizer solution Take 2.5 mg by nebulization every 6 (six) hours as needed for wheezing or shortness of breath.    Past Week at Unknown time  . ALPRAZolam (XANAX) 1 MG tablet Take 1 mg by mouth 3 (three) times daily.   06/24/2017 at Unknown time  . aspirin EC 81 MG tablet Take 81 mg by mouth daily.   06/24/2017 at Unknown time  . budesonide-formoterol (SYMBICORT) 160-4.5 MCG/ACT inhaler Inhale 2 puffs into the lungs 2 (two) times daily.    06/24/2017 at Unknown time  . citalopram (CELEXA) 20 MG tablet Take 20 mg by mouth at bedtime.    06/24/2017 at Unknown time  . gabapentin (NEURONTIN) 100 MG capsule Take 100 mg by mouth 5 (five)  times daily.    06/24/2017 at Unknown time  . isosorbide mononitrate (IMDUR) 30 MG 24 hr tablet Take 1 tablet (30 mg total) by mouth daily. 30 tablet 12 Past Week at Unknown time  . nitroGLYCERIN (NITROSTAT) 0.4 MG SL tablet Place 0.4 mg under the tongue every 5 (five) minutes as needed for chest pain.   Past Month at Unknown time  . oxyCODONE (ROXICODONE) 15 MG immediate release tablet Take 1 tablet by mouth 4 (four) times daily.   06/24/2017 at Unknown time  . tiotropium (SPIRIVA) 18 MCG inhalation capsule Place 18 mcg into inhaler and inhale at bedtime.    06/24/2017 at Unknown time  . traMADol (ULTRAM) 50 MG tablet Take 1 tablet (50 mg total) by mouth every 6 (six) hours as needed. (Patient taking differently: Take 50  mg by mouth every 6 (six) hours as needed for moderate pain. ) 20 tablet 0 06/24/2017 at Unknown time  . [DISCONTINUED] torsemide (DEMADEX) 20 MG tablet Take 1 tablet (20 mg total) by mouth 2 (two) times daily. 60 tablet 12 Past Week at Unknown time  . cephALEXin (KEFLEX) 500 MG capsule Take 1 capsule (500 mg total) by mouth 2 (two) times daily. (Patient not taking: Reported on 06/25/2017) 20 capsule 0 Completed Course at Unknown time  . oxyCODONE-acetaminophen (PERCOCET) 10-325 MG tablet Take 1 tablet by mouth every 6 (six) hours as needed for pain.    Not Taking at Unknown time   Scheduled: . ALPRAZolam  1 mg Oral TID  . aspirin EC  81 mg Oral Daily  . budesonide (PULMICORT) nebulizer solution  0.25 mg Nebulization BID  . citalopram  20 mg Oral QHS  . enoxaparin (LOVENOX) injection  30 mg Subcutaneous Q24H  . gabapentin  100 mg Oral 5 X Daily  . ipratropium-albuterol  3 mL Nebulization Q6H  . isosorbide mononitrate  45 mg Oral Daily  . methylPREDNISolone (SOLU-MEDROL) injection  40 mg Intravenous Q8H  . sodium chloride flush  3 mL Intravenous Q12H  . torsemide  20 mg Oral Daily   Continuous:  IDH:[id number] **OR** acetaminophen, ondansetron **OR** ondansetron (ZOFRAN) IV, oxyCODONE-acetaminophen **AND** oxyCODONE  Assesment: She was admitted with chest pain. She is known to have coronary disease COPD chronic kidney disease failure to thrive chronic diastolic heart failure. She has some confusion which I think is medication related and related to being in the hospital and out of her normal environment. She had PT evaluation and skilled care facility was recommended but I have discussed with the patient and her family and they do not want to do that. Active Problems:   Essential hypertension   Chronic respiratory failure with hypoxia (HCC)   Failure to thrive in adult   COPD exacerbation (HCC)   CKD (chronic kidney disease) stage 3, GFR 30-59 ml/min   Shortness of breath   Chronic  diastolic CHF (congestive heart failure) (HCC)   Chronic pain syndrome   Elevated brain natriuretic peptide (BNP) level    Plan: Discharge home    LOS: 2 days   Melanie Lowe 06/27/2017, 8:42 AM

## 2017-06-27 NOTE — Discharge Summary (Signed)
Physician Discharge Summary  Patient ID: Melanie Lowe MRN: [id number] DOB/AGE: [birth year] 81 y.o. Primary Care Physician:Millard Bautch, Ramon Dredge, MD Admit date: 06/24/2017 Discharge date: 06/27/2017    Discharge Diagnoses:   Active Problems:   Essential hypertension   Chronic respiratory failure with hypoxia (HCC)   Failure to thrive in adult   COPD exacerbation (HCC)   CKD (chronic kidney disease) stage 3, GFR 30-59 ml/min   Shortness of breath   Chronic diastolic CHF (congestive heart failure) (HCC)   Chronic pain syndrome   Elevated brain natriuretic peptide (BNP) level   Allergies as of 06/27/2017      Reactions   Ciprofloxacin Hcl Nausea And Vomiting   Nsaids Other (See Comments)   Advised by MD not to take this class of medication.  Pt is able to take coated Aspirin.     Other Other (See Comments)   Spices trigger acid reflux.    Sulfa Antibiotics Nausea And Vomiting   Sulfur Nausea And Vomiting   Tomato Other (See Comments)   Reaction:  Acid reflux      Medication List    STOP taking these medications   cephALEXin 500 MG capsule Commonly known as:  KEFLEX   oxyCODONE-acetaminophen 10-325 MG tablet Commonly known as:  PERCOCET     TAKE these medications   acetaminophen 500 MG tablet Commonly known as:  TYLENOL Take 250-500 mg by mouth every 6 (six) hours as needed for mild pain, moderate pain, fever or headache.   albuterol (2.5 MG/3ML) 0.083% nebulizer solution Commonly known as:  PROVENTIL Take 2.5 mg by nebulization every 6 (six) hours as needed for wheezing or shortness of breath.   ALPRAZolam 1 MG tablet Commonly known as:  XANAX Take 1 mg by mouth 3 (three) times daily.   aspirin EC 81 MG tablet Take 81 mg by mouth daily.   azithromycin 250 MG tablet Commonly known as:  ZITHROMAX Z-PAK Take 2 tablets (500 mg) on  Day 1,  followed by 1 tablet (250 mg) once daily on Days 2 through 5.   budesonide-formoterol 160-4.5 MCG/ACT inhaler Commonly known as:   SYMBICORT Inhale 2 puffs into the lungs 2 (two) times daily.   citalopram 20 MG tablet Commonly known as:  CELEXA Take 20 mg by mouth at bedtime.   gabapentin 100 MG capsule Commonly known as:  NEURONTIN Take 100 mg by mouth 5 (five) times daily.   isosorbide mononitrate 30 MG 24 hr tablet Commonly known as:  IMDUR Take 1.5 tablets (45 mg total) by mouth daily. What changed:  how much to take   nitroGLYCERIN 0.4 MG SL tablet Commonly known as:  NITROSTAT Place 0.4 mg under the tongue every 5 (five) minutes as needed for chest pain.   oxyCODONE 15 MG immediate release tablet Commonly known as:  ROXICODONE Take 1 tablet by mouth 4 (four) times daily.   predniSONE 10 MG (21) Tbpk tablet Commonly known as:  STERAPRED UNI-PAK 21 TAB Take by package instructions   tiotropium 18 MCG inhalation capsule Commonly known as:  SPIRIVA Place 18 mcg into inhaler and inhale at bedtime.   torsemide 20 MG tablet Commonly known as:  DEMADEX Take 1 tablet (20 mg total) by mouth daily. What changed:  when to take this   traMADol 50 MG tablet Commonly known as:  ULTRAM Take 1 tablet (50 mg total) by mouth every 6 (six) hours as needed. What changed:  reasons to take this       Discharged Condition:Improved  Consults: Cardiology  Significant Diagnostic Studies: Dg Chest 2 View  Result Date: 06/24/2017 CLINICAL DATA:  Left-sided chest pain and dyspnea, onset tonight. Several days history of productive cough. EXAM: CHEST  2 VIEW COMPARISON:  02/10/2017, 05/10/17. FINDINGS: Marked aortic tortuosity, unchanged. Mild cardiomegaly, unchanged. Stable chronic scarring bilaterally. No airspace consolidation. No effusion. Normal pulmonary vasculature. Chronic arthropathic changes of both shoulders. IMPRESSION: Stable cardiomegaly and aortic tortuosity. No consolidation or effusion. Electronically Signed   By: Ellery Plunk M.D.   On: 06/24/2017 23:26   Ct Angio Chest Pe W And/or Wo  Contrast  Result Date: 06/25/2017 CLINICAL DATA:  83 y/o F; shortness of breath and elevated D-dimer. History of COPD. EXAM: CT ANGIOGRAPHY CHEST WITH CONTRAST TECHNIQUE: Multidetector CT imaging of the chest was performed using the standard protocol during bolus administration of intravenous contrast. Multiplanar CT image reconstructions and MIPs were obtained to evaluate the vascular anatomy. CONTRAST:  64 cc Isovue 370 COMPARISON:  05/10/2017 CTA of the chest. FINDINGS: Cardiovascular: Stable 4.6 cm ascending thoracic aortic aneurysm. 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 Moderate diffuse calcific atherosclerosis. Normal heart size. No pericardial effusion. Moderate coronary artery calcifications. Satisfactory opacification of the pulmonary arteries. Mild respiratory motion artifact predominantly in the lung bases. No pulmonary embolus is identified. Mediastinum/Nodes: Mild heterogeneity with coarse calcifications in left lobe of thyroid. Lungs/Pleura: Moderate to severe centrilobular emphysema. Diffuse peribronchial thickening and scattered mucous plugging in the lung bases. Mild predominantly basilar cylindrical bronchiectasis. Platelike atelectasis within the left upper lobe lingula. Upper Abdomen: No acute abnormality. Musculoskeletal: No chest wall abnormality. No acute or significant osseous findings. Review of the MIP images confirms the above findings. IMPRESSION: 1. No pulmonary embolus identified. 2. Stable 4.6 cm ascending thoracic aortic aneurysm. Ascending thoracic aortic aneurysm. Recommend semi-annual imaging followup by CTA or MRA and referral to cardiothoracic surgery if not already obtained. This recommendation follows 2010 ACCF/AHA/AATS/ACR/ASA/SCA/SCAI/SIR/STS/SVM Guidelines for the Diagnosis and Management of Patients With Thoracic Aortic Disease. Circulation. 2010; 121: Z610-R604 3. Stable penetrating ulcer arising from the  left lateral wall of the aortic arch. 4. Stable moderate to severe centrilobular emphysema of the lungs. 5. Diffuse peribronchial thickening and scattered mucous plugging in the lung bases is compatible with acute bronchitis. 6. No consolidation to suggest pneumonia. Electronically Signed   By: Mitzi Hansen M.D.   On: 06/25/2017 03:13    Lab Results: Basic Metabolic Panel:  Recent Labs  [birth year] 2333 06/25/17 0807 06/26/17 0452  NA 140  --  135  K 4.2  --  4.8  CL 105  --  102  CO2 28  --  25  GLUCOSE 107*  --  209*  BUN 39*  --  39*  CREATININE 1.41*  --  1.30*  CALCIUM 9.1  --  9.4  MG  --  2.5*  --   PHOS  --  3.9  --    Liver Function Tests: No results for input(s): AST, ALT, ALKPHOS, BILITOT, PROT, ALBUMIN in the last 72 hours.   CBC:  Recent Labs  06/24/17 2333  WBC 4.9  NEUTROABS 2.6  HGB 10.9*  HCT 32.7*  MCV 94.8  PLT 198    No results found for this or any previous visit (from the past 240 hour(s)).   Hospital Course: This is a 83 year old with multiple medical problems. She is known to have coronary disease congestive heart failure COPD aortic aneurysm peripheral arterial disease. She came  to the hospital because of chest pain. She ruled out for acute coronary syndrome. Cardiology consultation was obtained and she was not felt to be a candidate for invasive treatment. Her Imdur was increased to 45 mg. PT saw her and recommended skilled care facility but when I talked to her family they did not want her placed in a skilled care facility for rehabilitation. She is ready for discharge and will be discharged home.  Discharge Exam: Blood pressure 136/78, pulse (!) 51, temperature 97 F (36.1 C), temperature source Oral, resp. rate 20, height 5\' 1"  (1.549 m), weight 59.6 kg (131 lb 8 oz), SpO2 100 %. Awake alert minimally confused. Chest is clear. Heart is regular.  Disposition: Home      Signed: Makensey Rego L   06/27/2017, 9:18 AM

## 2017-06-28 ENCOUNTER — Other Ambulatory Visit: Payer: Self-pay

## 2017-06-28 NOTE — Patient Outreach (Signed)
[facility]) Care Management  06/28/2017  Melanie Lowe 1924-03-11 [id number]      Transition of Care Referral  Referral Date: 06/28/17 Referral Source: [facility] Discharge Report Date of Admission: 06/25/17 Diagnosis: chest pain Date of Discharge: 06/27/17 Facility: [facility] Insurance: [facility]    Outreach attempt # 1 to patient. No answer at present.    Plan: RN CM will make outreach attempt to patient within one business day.    Antionette Fairy, RN,BSN,CCM [facility] Care Management Telephonic Care Management Coordinator Direct Phone: [phone] Toll Free: [phone] Fax: [phone]

## 2017-06-29 ENCOUNTER — Encounter (HOSPITAL_COMMUNITY): Payer: Self-pay | Admitting: *Deleted

## 2017-06-29 ENCOUNTER — Emergency Department (HOSPITAL_COMMUNITY)
Admission: EM | Admit: 2017-06-29 | Discharge: 2017-06-30 | Disposition: A | Discharge status: Home / Self Care | Payer: Medicare HMO | Attending: Emergency Medicine | Admitting: Emergency Medicine

## 2017-06-29 ENCOUNTER — Emergency Department (HOSPITAL_COMMUNITY): Payer: Medicare HMO

## 2017-06-29 DIAGNOSIS — Z7982 Long term (current) use of aspirin: Secondary | ICD-10-CM | POA: Insufficient documentation

## 2017-06-29 DIAGNOSIS — R069 Unspecified abnormalities of breathing: Secondary | ICD-10-CM | POA: Diagnosis not present

## 2017-06-29 DIAGNOSIS — G629 Polyneuropathy, unspecified: Secondary | ICD-10-CM | POA: Diagnosis not present

## 2017-06-29 DIAGNOSIS — N183 Chronic kidney disease, stage 3 (moderate): Secondary | ICD-10-CM | POA: Insufficient documentation

## 2017-06-29 DIAGNOSIS — I129 Hypertensive chronic kidney disease with stage 1 through stage 4 chronic kidney disease, or unspecified chronic kidney disease: Secondary | ICD-10-CM | POA: Insufficient documentation

## 2017-06-29 DIAGNOSIS — Z79899 Other long term (current) drug therapy: Secondary | ICD-10-CM | POA: Diagnosis not present

## 2017-06-29 DIAGNOSIS — J449 Chronic obstructive pulmonary disease, unspecified: Secondary | ICD-10-CM | POA: Diagnosis not present

## 2017-06-29 DIAGNOSIS — I110 Hypertensive heart disease with heart failure: Secondary | ICD-10-CM | POA: Diagnosis not present

## 2017-06-29 DIAGNOSIS — Z87891 Personal history of nicotine dependence: Secondary | ICD-10-CM | POA: Diagnosis not present

## 2017-06-29 DIAGNOSIS — R531 Weakness: Secondary | ICD-10-CM

## 2017-06-29 DIAGNOSIS — E860 Dehydration: Secondary | ICD-10-CM | POA: Diagnosis not present

## 2017-06-29 DIAGNOSIS — I509 Heart failure, unspecified: Secondary | ICD-10-CM | POA: Diagnosis not present

## 2017-06-29 DIAGNOSIS — R0602 Shortness of breath: Secondary | ICD-10-CM | POA: Diagnosis not present

## 2017-06-29 DIAGNOSIS — I959 Hypotension, unspecified: Secondary | ICD-10-CM | POA: Insufficient documentation

## 2017-06-29 MED ORDER — SODIUM CHLORIDE 0.9 % IV BOLUS (SEPSIS)
1000.0000 mL | Freq: Once | INTRAVENOUS | Status: AC
  Administered 2017-06-29: 1000 mL via INTRAVENOUS

## 2017-06-29 MED ORDER — SODIUM CHLORIDE 0.9 % IV BOLUS (SEPSIS)
500.0000 mL | Freq: Once | INTRAVENOUS | Status: AC
  Administered 2017-06-30: 500 mL via INTRAVENOUS

## 2017-06-29 NOTE — ED Triage Notes (Signed)
Pt c/o sob and upper back pain between shoulder blades all day; pt's family states house is hot; pt found sitting on front porch

## 2017-06-30 DIAGNOSIS — Z743 Need for continuous supervision: Secondary | ICD-10-CM | POA: Diagnosis not present

## 2017-06-30 DIAGNOSIS — R279 Unspecified lack of coordination: Secondary | ICD-10-CM | POA: Diagnosis not present

## 2017-06-30 DIAGNOSIS — R0602 Shortness of breath: Secondary | ICD-10-CM | POA: Diagnosis not present

## 2017-06-30 LAB — CBC WITH DIFFERENTIAL/PLATELET
Basophils Absolute: 0 10*3/uL (ref 0.0–0.1)
Basophils Relative: 0 %
Eosinophils Absolute: 0.1 10*3/uL (ref 0.0–0.7)
Eosinophils Relative: 2 %
HEMATOCRIT: 35.1 % — AB (ref 36.0–46.0)
HEMOGLOBIN: 11.4 g/dL — AB (ref 12.0–15.0)
LYMPHS ABS: 1.5 10*3/uL (ref 0.7–4.0)
LYMPHS PCT: 26 %
MCH: 31.3 pg (ref 26.0–34.0)
MCHC: 32.5 g/dL (ref 30.0–36.0)
MCV: 96.4 fL (ref 78.0–100.0)
MONOS PCT: 11 %
Monocytes Absolute: 0.7 10*3/uL (ref 0.1–1.0)
NEUTROS PCT: 61 %
Neutro Abs: 3.5 10*3/uL (ref 1.7–7.7)
Platelets: 220 10*3/uL (ref 150–400)
RBC: 3.64 MIL/uL — AB (ref 3.87–5.11)
RDW: 13.3 % (ref 11.5–15.5)
WBC: 5.7 10*3/uL (ref 4.0–10.5)

## 2017-06-30 LAB — COMPREHENSIVE METABOLIC PANEL
ALBUMIN: 3.4 g/dL — AB (ref 3.5–5.0)
ALK PHOS: 62 U/L (ref 38–126)
ALT: 25 U/L (ref 14–54)
ANION GAP: 9 (ref 5–15)
AST: 27 U/L (ref 15–41)
BUN: 48 mg/dL — AB (ref 6–20)
CO2: 29 mmol/L (ref 22–32)
Calcium: 8.7 mg/dL — ABNORMAL LOW (ref 8.9–10.3)
Chloride: 105 mmol/L (ref 101–111)
Creatinine, Ser: 1.39 mg/dL — ABNORMAL HIGH (ref 0.44–1.00)
GFR calc Af Amer: 37 mL/min — ABNORMAL LOW (ref >60)
GFR calc non Af Amer: 32 mL/min — ABNORMAL LOW (ref >60)
GLUCOSE: 157 mg/dL — AB (ref 65–99)
POTASSIUM: 4.6 mmol/L (ref 3.5–5.1)
SODIUM: 143 mmol/L (ref 135–145)
Total Bilirubin: 0.4 mg/dL (ref 0.3–1.2)
Total Protein: 6.3 g/dL — ABNORMAL LOW (ref 6.5–8.1)

## 2017-06-30 LAB — BRAIN NATRIURETIC PEPTIDE: B Natriuretic Peptide: 85 pg/mL (ref 0.0–100.0)

## 2017-06-30 LAB — TROPONIN I: Troponin I: <0.03 ng/mL (ref <0.03)

## 2017-06-30 LAB — LACTIC ACID, PLASMA: Lactic Acid, Venous: 1.3 mmol/L (ref 0.5–1.9)

## 2017-06-30 LAB — CK: Total CK: 30 U/L — ABNORMAL LOW (ref 38–234)

## 2017-06-30 NOTE — ED Notes (Signed)
Spoke with pt's daughter to give an update; she stated she would like to be called regarding pt's status

## 2017-06-30 NOTE — Discharge Instructions (Signed)
You need to drink plenty of fluids, especially if it's hot. Continue to use your inhalers as needed for wheezing.  Recheck as needed.

## 2017-06-30 NOTE — ED Notes (Signed)
Pt given ginger ale to drink upon request  

## 2017-06-30 NOTE — ED Provider Notes (Signed)
[facility]Y DEPT Provider Note   CSN: [id number] Arrival date & time: 06/29/17  2302  Time seen 23:11 PM    History   Chief Complaint Chief Complaint  Patient presents with  . Shortness of Breath    HPI Melanie Lowe is a 83 y.o. female.  HPI   patient presents via EMS with a complaint of feeling weak. She states she was admitted about 2 weeks ago when she fell and was feeling weak. She states she started having trouble breathing today. She states she's coughing and coughing up white sputum. She denies fever or chills. She states she has been wheezing and when she used her nebulizer at home and only helped a short while. She denies nausea or vomiting but states last night she had 4 episodes of loose diarrhea stools. She states she gets lower abdominal pain with the diarrhea and has been having diarrhea off and on for several months. She states her doctor has said he was going to put her on medicine for it but he hasn't. She denies chest pain but states she feels short of breath. She states she is eating normally. She does states she feels lightheaded and dizzy.  Patient is on oxygen 3 L/m nasal cannula.  PCP Kari Baars, MD   Past Medical History:  Diagnosis Date  . AAA (abdominal aortic aneurysm) (HCC)   . Anxiety   . Arthritis   . CHF (congestive heart failure) (HCC) 11/09/2015  . COPD (chronic obstructive pulmonary disease) (HCC)   . Depression   . Hypertension   . Leg swelling   . Myocardial infarction (HCC)   . Neuropathy   . Oxygen dependent   . Pneumonia   . Stroke [facility])     Patient Active Problem List   Diagnosis Date Noted  . Shortness of breath 06/25/2017  . Chronic diastolic CHF (congestive heart failure) (HCC) 06/25/2017  . Chronic pain syndrome 06/25/2017  . Elevated brain natriuretic peptide (BNP) level   . CKD (chronic kidney disease) stage 3, GFR 30-59 ml/min 09/02/2016  . Diastolic dysfunction 09/02/2016  . Malnutrition of moderate degree  04/13/2016  . COPD exacerbation (HCC) 04/11/2016  . Anxiety 04/11/2016  . PNA (pneumonia) 04/11/2016  . Acute on chronic diastolic heart failure (HCC) 11/12/2015  . CHF (congestive heart failure) (HCC) 11/09/2015  . Acute on chronic respiratory failure (HCC) 11/05/2015  . Palliative care encounter   . DNR (do not resuscitate)   . Diverticulitis 11/02/2015  . Acute diverticulitis 11/02/2015  . AKI (acute kidney injury) (HCC) 11/02/2015  . Anemia 11/02/2015  . RLL pneumonia (HCC) 11/02/2015  . Pneumonia 04/12/2015  . Failure to thrive in adult 11/11/2014  . Acute encephalopathy 11/11/2014  . Generalized weakness 11/11/2014  . Hypokalemia 11/11/2014  . Bradycardia 11/11/2014  . Dehydration 01/26/2014  . Orthostatic dizziness 01/24/2014  . SOB (shortness of breath) 01/24/2014  . Chronic respiratory failure with hypoxia (HCC) 01/24/2014  . Cough with hemoptysis 01/24/2014  . COPD (chronic obstructive pulmonary disease) (HCC) 01/24/2014  . Acute renal failure (HCC) 01/24/2014  . Dizzy 01/24/2014  . Acute-on-chronic respiratory failure (HCC) 10/12/2012  . Chest pain 10/10/2012  . CAP (community acquired pneumonia) 10/10/2012  . Aortic aneurysm (HCC) 04/16/2012  . Chest pain 08/01/2011  . HYPERCALCEMIA 06/16/2008  . ALLERGIC RHINITIS 12/05/2007  . WEIGHT GAIN 11/07/2007  . PAIN IN JOINT, LOWER LEG 10/09/2007  . TOBACCO ABUSE 09/29/2007  . Abdominal aortic aneurysm (HCC) 09/29/2007  . Sciatica 09/24/2007  . BACK PAIN  09/24/2007  . Anxiety disorder 11/28/2006  . DEPRESSION 11/28/2006  . CATARACT NOS 11/28/2006  . Essential hypertension 11/28/2006  . MYOCARDIAL INFARCTION, HX OF 11/28/2006  . COPD 11/28/2006  . OVERACTIVE BLADDER 11/28/2006  . UTI'S, RECURRENT 11/28/2006  . LOW BACK PAIN 11/28/2006  . URINARY INCONTINENCE 11/28/2006    Past Surgical History:  Procedure Laterality Date  . APPENDECTOMY    . CHOLECYSTECTOMY      OB History    Gravida Para Term Preterm AB  Living   5 5 5          SAB TAB Ectopic Multiple Live Births                   Home Medications    Prior to Admission medications   Medication Sig Start Date End Date Taking? Authorizing Provider  acetaminophen (TYLENOL) 500 MG tablet Take 250-500 mg by mouth every 6 (six) hours as needed for mild pain, moderate pain, fever or headache.     [provider]  albuterol (PROVENTIL) (2.5 MG/3ML) 0.083% nebulizer solution Take 2.5 mg by nebulization every 6 (six) hours as needed for wheezing or shortness of breath.     [provider]  ALPRAZolam Prudy Feeler) 1 MG tablet Take 1 mg by mouth 3 (three) times daily as needed.     [provider]  aspirin EC 81 MG tablet Take 81 mg by mouth daily.    [provider]  azithromycin (ZITHROMAX Z-PAK) 250 MG tablet Take 2 tablets (500 mg) on  Day 1,  followed by 1 tablet (250 mg) once daily on Days 2 through 5. 06/27/17 07/02/17  Kari Baars, MD  budesonide-formoterol [facility]) 160-4.5 MCG/ACT inhaler Inhale 2 puffs into the lungs 2 (two) times daily.     [provider]  citalopram (CELEXA) 20 MG tablet Take 20 mg by mouth at bedtime.     [provider]  gabapentin (NEURONTIN) 100 MG capsule Take 100 mg by mouth 5 (five) times daily.     [provider]  isosorbide mononitrate (IMDUR) 30 MG 24 hr tablet Take 1.5 tablets (45 mg total) by mouth daily. 06/27/17   Kari Baars, MD  nitroGLYCERIN (NITROSTAT) 0.4 MG SL tablet Place 0.4 mg under the tongue every 5 (five) minutes as needed for chest pain. 08/17/16   [provider]  oxyCODONE (ROXICODONE) 15 MG immediate release tablet Take 1 tablet by mouth 4 (four) times daily. 06/21/17   [provider]  predniSONE (STERAPRED UNI-PAK 21 TAB) 10 MG (21) TBPK tablet Take by package instructions 06/27/17   Kari Baars, MD  tiotropium (SPIRIVA) 18 MCG inhalation capsule Place 18 mcg into inhaler and inhale at bedtime.     [provider]  torsemide (DEMADEX) 20 MG tablet Take 1 tablet (20 mg total) by mouth daily. 06/27/17   Kari Baars, MD  traMADol (ULTRAM) 50 MG tablet Take 1 tablet (50 mg total) by mouth every 6 (six) hours as needed. Patient taking differently: Take 50 mg by mouth every 6 (six) hours as needed for moderate pain.  02/10/17   [name], MD    Family History Family History  Problem Relation Age of Onset  . Diabetes Brother   . Cancer Brother        Throat cancer, Heart attack  . Heart disease Brother   . Heart attack Brother   . Cancer Father   . Cancer Sister     Social History Social History  Substance  Use Topics  . Smoking status: Former Smoker    Years: 50.00    Types: Cigarettes    Quit date: 12/31/2010  . Smokeless tobacco: Former Neurosurgeon    Quit date: 12/31/2010  . Alcohol use No  lives with son On oxygen 3 lpm [place]   Allergies   Ciprofloxacin hcl; Nsaids; Other; Sulfa antibiotics; Sulfur; and Tomato   Review of Systems Review of Systems  All other systems reviewed and are negative.    Physical Exam Updated Vital Signs BP (!) 85/68 (BP Location: Left Arm)   Pulse 81   Temp 97.9 F (36.6 C) (Oral)   Resp 19   Ht 5\' 1"  (1.549 m)   Wt 59.4 kg (131 lb)   SpO2 93%   BMI 24.75 kg/m   Vital signs normal except for hypotension   Physical Exam  Constitutional: She is oriented to person, place, and time.  Thin frail elderly female  HENT:  Head: Normocephalic and atraumatic.  Right Ear: External ear normal.  Left Ear: External ear normal.  Nose: Nose normal.  Dry tongue  Eyes: Conjunctivae and EOM are normal. Pupils are equal, round, and reactive to light.  Cardiovascular: Normal rate, regular rhythm and normal heart sounds.   Pulmonary/Chest: Effort normal and breath sounds normal. No respiratory distress.  Abdominal: Soft. Bowel sounds are normal. There is tenderness.  Mild diffuse tenderness lower abdomen  Musculoskeletal: Normal range of motion.  She exhibits no edema.  Neurological: She is alert and oriented to person, place, and time. No cranial nerve deficit.  Skin: Skin is warm and dry. Capillary refill takes less than 2 seconds.  Psychiatric: She has a normal mood and affect. Her behavior is normal. Thought content normal.  Nursing note and vitals reviewed.    ED Treatments / Results  Labs (all labs ordered are listed, but only abnormal results are displayed) Results for orders placed or performed during the hospital encounter of 06/29/17  Comprehensive metabolic panel  Result Value Ref Range   Sodium 143 135 - 145 mmol/L   Potassium 4.6 3.5 - 5.1 mmol/L   Chloride 105 101 - 111 mmol/L   CO2 29 22 - 32 mmol/L   Glucose, Bld 157 (H) 65 - 99 mg/dL   BUN 48 (H) 6 - 20 mg/dL   Creatinine, Ser 1.61 (H) 0.44 - 1.00 mg/dL   Calcium 8.7 (L) 8.9 - 10.3 mg/dL   Total Protein 6.3 (L) 6.5 - 8.1 g/dL   Albumin 3.4 (L) 3.5 - 5.0 g/dL   AST 27 15 - 41 U/L   ALT 25 14 - 54 U/L   Alkaline Phosphatase 62 38 - 126 U/L   Total Bilirubin 0.4 0.3 - 1.2 mg/dL   GFR calc non Af Amer 32 (L) >60 mL/min   GFR calc Af Amer 37 (L) >60 mL/min   Anion gap 9 5 - 15  Troponin I  Result Value Ref Range   Troponin I <0.03 <0.03 ng/mL  Brain natriuretic peptide  Result Value Ref Range   B Natriuretic Peptide 85.0 0.0 - 100.0 pg/mL  CBC with Differential  Result Value Ref Range   WBC 5.7 4.0 - 10.5 K/uL   RBC 3.64 (L) 3.87 - 5.11 MIL/uL   Hemoglobin 11.4 (L) 12.0 - 15.0 g/dL   HCT 09.6 (L) 04.5 - 40.9 %   MCV 96.4 78.0 - 100.0 fL   MCH 31.3 26.0 - 34.0 pg   MCHC 32.5 30.0 - 36.0 g/dL  RDW 13.3 11.5 - 15.5 %   Platelets 220 150 - 400 K/uL   Neutrophils Relative % 61 %   Neutro Abs 3.5 1.7 - 7.7 K/uL   Lymphocytes Relative 26 %   Lymphs Abs 1.5 0.7 - 4.0 K/uL   Monocytes Relative 11 %   Monocytes Absolute 0.7 0.1 - 1.0 K/uL   Eosinophils Relative 2 %   Eosinophils Absolute 0.1 0.0 - 0.7 K/uL   Basophils Relative 0 %   Basophils  Absolute 0.0 0.0 - 0.1 K/uL  Lactic acid, plasma  Result Value Ref Range   Lactic Acid, Venous 1.3 0.5 - 1.9 mmol/L  CK  Result Value Ref Range   Total CK 30 (L) 38 - 234 U/L   Laboratory interpretation all normal except Mild stable anemia, stable renal insufficiency        EKG  EKG Interpretation  Date/Time:  Saturday June 29 2017 23:07:50 EDT Ventricular Rate:  82 PR Interval:    QRS Duration: 100 QT Interval:  392 QTC Calculation: 458 R Axis:   31 Text Interpretation:  Sinus rhythm Prolonged PR interval Baseline wander in lead(s) V6 No significant change since last tracing 24 Jun 2017 Confirmed by Devoria Albe ([id number]) on 06/29/2017 11:21:11 PM       Radiology Dg Chest 2 View  Result Date: 06/30/2017 CLINICAL DATA:  Shortness of breath and upper back pain between the shoulder blades EXAM: CHEST  2 VIEW COMPARISON:  06/25/2017, 06/24/2017 FINDINGS: Linear atelectasis or scar at both bases. Elevation of the left diaphragm. No pleural effusion. Hyperinflation. Stable enlarged cardiomediastinal silhouette with atherosclerosis. Tortuous ectatic aorta with prior CT demonstrating aneurysmal dilatation. Patient rotation exaggerates the mediastinal contour. No pneumothorax. IMPRESSION: 1. Linear atelectasis or scarring at the bases 2. Stable cardiomegaly and aortic tortuosity. Electronically Signed   By: Jasmine Pang M.D.   On: 06/30/2017 00:19     Dg Chest 2 View  Result Date: 06/24/2017 CLINICAL DATA:  Left-sided chest pain and dyspnea, onset tonight. Several days history of productive cough.. IMPRESSION: Stable cardiomegaly and aortic tortuosity. No consolidation or effusion. Electronically Signed   By: Ellery Plunk M.D.   On: 06/24/2017 23:26   Ct Angio Chest Pe W And/or Wo Contrast  Result Date: 06/25/2017 CLINICAL DATA:  83 y/o F; shortness of breath and elevated D-dimer. History of COPD. IMPRESSION: 1. No pulmonary embolus identified. 2. Stable 4.6 cm ascending thoracic  aortic aneurysm. Ascending thoracic aortic aneurysm. Recommend semi-annual imaging followup by CTA or MRA and referral to cardiothoracic surgery if not already obtained. This recommendation follows 2010 ACCF/AHA/AATS/ACR/ASA/SCA/SCAI/SIR/STS/SVM Guidelines for the Diagnosis and Management of Patients With Thoracic Aortic Disease. Circulation. 2010; 121: U045-W098 3. Stable penetrating ulcer arising from the left lateral wall of the aortic arch. 4. Stable moderate to severe centrilobular emphysema of the lungs. 5. Diffuse peribronchial thickening and scattered mucous plugging in the lung bases is compatible with acute bronchitis. 6. No consolidation to suggest pneumonia. Electronically Signed   By: Mitzi Hansen M.D.   On: 06/25/2017 03:13  Procedures Procedures (including critical care time)  Medications Ordered in ED Medications  sodium chloride 0.9 % bolus 1,000 mL (0 mLs Intravenous Stopped 06/30/17 0119)  sodium chloride 0.9 % bolus 500 mL (500 mLs Intravenous New Bag/Given 06/30/17 0119)     Initial Impression / Assessment and Plan / ED Course  I have reviewed the triage vital signs and the nursing notes.  Pertinent labs & imaging results that were available during my care  of the patient were reviewed by me and considered in my medical decision making (see chart for details).  Patient was given IV fluids for probable dehydration from her diarrhea. I did not hear any wheezing or notice any restriction of her breathing or tachypnea. A nebulizer was not given when I first saw her. Patient was noted to have hypotension and she was given IV fluids for that also.  Recheck at 1:35 AM patient heart rate is 58 blood pressure is 116/74. Patient does not appear to be in respiratory distress, her lungs are clear with good air movement. She now is just complaining of her back hurting. At this point I think patient was dehydrated and she can go home, her lactic acid was normal.I have not heard her  coughing while in the ED.   Final Clinical Impressions(s) / ED Diagnoses   Final diagnoses:  Weakness  Hypotension, unspecified hypotension type  Dehydration    Plan discharge  Devoria Albe, MD, Concha Pyo, MD 06/30/17 [phone]

## 2017-07-01 ENCOUNTER — Other Ambulatory Visit: Payer: Self-pay

## 2017-07-01 NOTE — Patient Outreach (Signed)
[facility]) Care Management  07/01/2017  Melanie Lowe 04-06-1924 [id number]   Transition of Care Referral  Referral Date: 06/28/17 Referral Source: [facility] Discharge Report Date of Admission: 06/25/17 Diagnosis: chest pain Date of Discharge: 06/27/17 Facility: [facility] Insurance: [facility]   Outreach attempt #2 to patient. Patient's son answered the phone and reported that patient was taking a nap at present. No ROI on file for son. No PHI given. Son voiced that patient was doing fine. Advised that RN CM would call back at another time to speak with patient.     Plan: RN CM will make outreach attempt to patient within one business day.  Antionette Fairy, RN,BSN,CCM [facility] Care Management Telephonic Care Management Coordinator Direct Phone: [phone] Toll Free: [phone] Fax: [phone]

## 2017-07-02 ENCOUNTER — Other Ambulatory Visit: Payer: Self-pay

## 2017-07-02 NOTE — Patient Outreach (Signed)
[facility]) Care Management  07/02/2017  Melanie Lowe 1924-04-21 [id number]     Transition of Care Referral  Referral Date: 06/28/17 Referral Source: [facility] Discharge Report Date of Admission: 06/25/17 Diagnosis: chest pain Date of Discharge: 06/27/17 Facility: [facility] Insurance: [facility]    Outreach attempt #3 to patient. No answer at present.    Plan: RN CM will send unsuccessful outreach letter to patient and close case if no response within 10 business days.    Antionette Fairy, RN,BSN,CCM [facility] Care Management Telephonic Care Management Coordinator Direct Phone: [phone] Toll Free: [phone] Fax: [phone]

## 2017-07-06 ENCOUNTER — Emergency Department (HOSPITAL_COMMUNITY): Payer: Medicare HMO

## 2017-07-06 ENCOUNTER — Encounter (HOSPITAL_COMMUNITY): Payer: Self-pay

## 2017-07-06 ENCOUNTER — Inpatient Hospital Stay (HOSPITAL_COMMUNITY)
Admission: EM | Admit: 2017-07-06 | Discharge: 2017-07-09 | DRG: 299 | Disposition: A | Discharge status: Home / Self Care | Payer: Medicare HMO | Attending: Family Medicine | Admitting: Family Medicine

## 2017-07-06 DIAGNOSIS — G8929 Other chronic pain: Secondary | ICD-10-CM | POA: Diagnosis present

## 2017-07-06 DIAGNOSIS — I729 Aneurysm of unspecified site: Secondary | ICD-10-CM | POA: Diagnosis not present

## 2017-07-06 DIAGNOSIS — G629 Polyneuropathy, unspecified: Secondary | ICD-10-CM | POA: Diagnosis present

## 2017-07-06 DIAGNOSIS — R279 Unspecified lack of coordination: Secondary | ICD-10-CM | POA: Diagnosis not present

## 2017-07-06 DIAGNOSIS — I10 Essential (primary) hypertension: Secondary | ICD-10-CM

## 2017-07-06 DIAGNOSIS — Z7189 Other specified counseling: Secondary | ICD-10-CM | POA: Diagnosis not present

## 2017-07-06 DIAGNOSIS — Z66 Do not resuscitate: Secondary | ICD-10-CM | POA: Diagnosis present

## 2017-07-06 DIAGNOSIS — N183 Chronic kidney disease, stage 3 unspecified: Secondary | ICD-10-CM | POA: Diagnosis present

## 2017-07-06 DIAGNOSIS — E43 Unspecified severe protein-calorie malnutrition: Secondary | ICD-10-CM | POA: Diagnosis present

## 2017-07-06 DIAGNOSIS — Z6825 Body mass index (BMI) 25.0-25.9, adult: Secondary | ICD-10-CM | POA: Diagnosis not present

## 2017-07-06 DIAGNOSIS — I959 Hypotension, unspecified: Secondary | ICD-10-CM | POA: Diagnosis present

## 2017-07-06 DIAGNOSIS — Z7401 Bed confinement status: Secondary | ICD-10-CM | POA: Diagnosis not present

## 2017-07-06 DIAGNOSIS — F419 Anxiety disorder, unspecified: Secondary | ICD-10-CM

## 2017-07-06 DIAGNOSIS — I130 Hypertensive heart and chronic kidney disease with heart failure and stage 1 through stage 4 chronic kidney disease, or unspecified chronic kidney disease: Secondary | ICD-10-CM | POA: Diagnosis present

## 2017-07-06 DIAGNOSIS — I713 Abdominal aortic aneurysm, ruptured, unspecified: Secondary | ICD-10-CM | POA: Insufficient documentation

## 2017-07-06 DIAGNOSIS — I714 Abdominal aortic aneurysm, without rupture, unspecified: Secondary | ICD-10-CM | POA: Diagnosis present

## 2017-07-06 DIAGNOSIS — F329 Major depressive disorder, single episode, unspecified: Secondary | ICD-10-CM | POA: Diagnosis present

## 2017-07-06 DIAGNOSIS — Z8673 Personal history of transient ischemic attack (TIA), and cerebral infarction without residual deficits: Secondary | ICD-10-CM | POA: Diagnosis not present

## 2017-07-06 DIAGNOSIS — J9611 Chronic respiratory failure with hypoxia: Secondary | ICD-10-CM | POA: Diagnosis present

## 2017-07-06 DIAGNOSIS — I252 Old myocardial infarction: Secondary | ICD-10-CM | POA: Diagnosis not present

## 2017-07-06 DIAGNOSIS — R109 Unspecified abdominal pain: Secondary | ICD-10-CM | POA: Diagnosis not present

## 2017-07-06 DIAGNOSIS — R1030 Lower abdominal pain, unspecified: Secondary | ICD-10-CM | POA: Diagnosis not present

## 2017-07-06 DIAGNOSIS — M546 Pain in thoracic spine: Secondary | ICD-10-CM | POA: Diagnosis present

## 2017-07-06 DIAGNOSIS — R627 Adult failure to thrive: Secondary | ICD-10-CM | POA: Diagnosis present

## 2017-07-06 DIAGNOSIS — Z7982 Long term (current) use of aspirin: Secondary | ICD-10-CM

## 2017-07-06 DIAGNOSIS — Z79899 Other long term (current) drug therapy: Secondary | ICD-10-CM

## 2017-07-06 DIAGNOSIS — J449 Chronic obstructive pulmonary disease, unspecified: Secondary | ICD-10-CM | POA: Diagnosis not present

## 2017-07-06 DIAGNOSIS — Z87891 Personal history of nicotine dependence: Secondary | ICD-10-CM

## 2017-07-06 DIAGNOSIS — Z515 Encounter for palliative care: Secondary | ICD-10-CM | POA: Diagnosis not present

## 2017-07-06 DIAGNOSIS — I2510 Atherosclerotic heart disease of native coronary artery without angina pectoris: Secondary | ICD-10-CM | POA: Diagnosis not present

## 2017-07-06 DIAGNOSIS — I509 Heart failure, unspecified: Secondary | ICD-10-CM | POA: Diagnosis present

## 2017-07-06 DIAGNOSIS — Z9981 Dependence on supplemental oxygen: Secondary | ICD-10-CM | POA: Diagnosis not present

## 2017-07-06 LAB — CBC WITH DIFFERENTIAL/PLATELET
Basophils Absolute: 0 10*3/uL (ref 0.0–0.1)
Basophils Relative: 0 %
EOS ABS: 0 10*3/uL (ref 0.0–0.7)
Eosinophils Relative: 0 %
HCT: 35.4 % — ABNORMAL LOW (ref 36.0–46.0)
HEMOGLOBIN: 11.8 g/dL — AB (ref 12.0–15.0)
LYMPHS ABS: 1.1 10*3/uL (ref 0.7–4.0)
Lymphocytes Relative: 10 %
MCH: 31.4 pg (ref 26.0–34.0)
MCHC: 33.3 g/dL (ref 30.0–36.0)
MCV: 94.1 fL (ref 78.0–100.0)
MONOS PCT: 9 %
Monocytes Absolute: 1 10*3/uL (ref 0.1–1.0)
NEUTROS PCT: 81 %
Neutro Abs: 9 10*3/uL — ABNORMAL HIGH (ref 1.7–7.7)
Platelets: 258 10*3/uL (ref 150–400)
RBC: 3.76 MIL/uL — ABNORMAL LOW (ref 3.87–5.11)
RDW: 13.3 % (ref 11.5–15.5)
WBC: 11.1 10*3/uL — ABNORMAL HIGH (ref 4.0–10.5)

## 2017-07-06 LAB — URINALYSIS, ROUTINE W REFLEX MICROSCOPIC
BILIRUBIN URINE: NEGATIVE
Glucose, UA: 50 mg/dL — AB
Ketones, ur: NEGATIVE mg/dL
Leukocytes, UA: NEGATIVE
Nitrite: NEGATIVE
PH: 7 (ref 5.0–8.0)
Protein, ur: NEGATIVE mg/dL
SPECIFIC GRAVITY, URINE: 1.016 (ref 1.005–1.030)

## 2017-07-06 LAB — COMPREHENSIVE METABOLIC PANEL
ALT: 24 U/L (ref 14–54)
AST: 18 U/L (ref 15–41)
Albumin: 3.7 g/dL (ref 3.5–5.0)
Alkaline Phosphatase: 80 U/L (ref 38–126)
Anion gap: 8 (ref 5–15)
BUN: 27 mg/dL — AB (ref 6–20)
CHLORIDE: 104 mmol/L (ref 101–111)
CO2: 27 mmol/L (ref 22–32)
CREATININE: 1.16 mg/dL — AB (ref 0.44–1.00)
Calcium: 9.2 mg/dL (ref 8.9–10.3)
GFR calc non Af Amer: 40 mL/min — ABNORMAL LOW (ref >60)
GFR, EST AFRICAN AMERICAN: 46 mL/min — AB (ref >60)
Glucose, Bld: 172 mg/dL — ABNORMAL HIGH (ref 65–99)
Potassium: 4.5 mmol/L (ref 3.5–5.1)
SODIUM: 139 mmol/L (ref 135–145)
Total Bilirubin: 0.6 mg/dL (ref 0.3–1.2)
Total Protein: 6.8 g/dL (ref 6.5–8.1)

## 2017-07-06 LAB — SAMPLE TO BLOOD BANK

## 2017-07-06 LAB — MRSA PCR SCREENING: MRSA by PCR: POSITIVE — AB

## 2017-07-06 MED ORDER — SODIUM CHLORIDE 0.9 % IV BOLUS (SEPSIS)
250.0000 mL | Freq: Once | INTRAVENOUS | Status: AC
  Administered 2017-07-06: 250 mL via INTRAVENOUS

## 2017-07-06 MED ORDER — FENTANYL CITRATE (PF) 100 MCG/2ML IJ SOLN
INTRAMUSCULAR | Status: AC
  Administered 2017-07-06: 25 ug via INTRAVENOUS
  Filled 2017-07-06: qty 2

## 2017-07-06 MED ORDER — FENTANYL CITRATE (PF) 100 MCG/2ML IJ SOLN: 50.0000 ug | INTRAMUSCULAR | Status: DC | PRN

## 2017-07-06 MED ORDER — NICARDIPINE HCL IN NACL 20-0.86 MG/200ML-% IV SOLN: 3.0000 mg/h | Freq: Once | INTRAVENOUS | Status: DC

## 2017-07-06 MED ORDER — SODIUM CHLORIDE 0.9% FLUSH: 3.0000 mL | INTRAVENOUS | Status: DC | PRN

## 2017-07-06 MED ORDER — ALPRAZOLAM 1 MG PO TABS
1.0000 mg | ORAL_TABLET | Freq: Three times a day (TID) | ORAL | Status: DC | PRN
  Administered 2017-07-07 – 2017-07-08 (×2): 1 mg via ORAL
  Filled 2017-07-06: qty 2
  Filled 2017-07-06: qty 1

## 2017-07-06 MED ORDER — ONDANSETRON HCL 4 MG/2ML IJ SOLN: 4.0000 mg | Freq: Four times a day (QID) | INTRAMUSCULAR | Status: DC | PRN

## 2017-07-06 MED ORDER — METOPROLOL TARTRATE 25 MG PO TABS
12.5000 mg | ORAL_TABLET | Freq: Two times a day (BID) | ORAL | Status: DC
  Administered 2017-07-07 – 2017-07-09 (×3): 12.5 mg via ORAL
  Filled 2017-07-06 (×5): qty 1

## 2017-07-06 MED ORDER — NICARDIPINE HCL IN NACL 20-0.86 MG/200ML-% IV SOLN
3.0000 mg/h | Freq: Once | INTRAVENOUS | Status: AC
  Administered 2017-07-06: 5 mg/h via INTRAVENOUS
  Filled 2017-07-06: qty 200

## 2017-07-06 MED ORDER — GABAPENTIN 100 MG PO CAPS
100.0000 mg | ORAL_CAPSULE | Freq: Every day | ORAL | Status: DC
  Administered 2017-07-06 – 2017-07-09 (×15): 100 mg via ORAL
  Filled 2017-07-06 (×16): qty 1

## 2017-07-06 MED ORDER — SODIUM CHLORIDE 0.9 % IV SOLN: 250.0000 mL | INTRAVENOUS | Status: DC | PRN

## 2017-07-06 MED ORDER — OXYCODONE HCL 5 MG PO TABS
15.0000 mg | ORAL_TABLET | Freq: Four times a day (QID) | ORAL | Status: DC
  Administered 2017-07-06 – 2017-07-09 (×6): 15 mg via ORAL
  Filled 2017-07-06 (×9): qty 3

## 2017-07-06 MED ORDER — SODIUM CHLORIDE 0.9% FLUSH
3.0000 mL | Freq: Two times a day (BID) | INTRAVENOUS | Status: DC
  Administered 2017-07-06 – 2017-07-09 (×6): 3 mL via INTRAVENOUS

## 2017-07-06 MED ORDER — ONDANSETRON HCL 4 MG/2ML IJ SOLN
4.0000 mg | Freq: Once | INTRAMUSCULAR | Status: AC
  Administered 2017-07-06: 4 mg via INTRAVENOUS
  Filled 2017-07-06: qty 2

## 2017-07-06 MED ORDER — TIOTROPIUM BROMIDE MONOHYDRATE 18 MCG IN CAPS
18.0000 ug | ORAL_CAPSULE | Freq: Every day | RESPIRATORY_TRACT | Status: DC
  Administered 2017-07-06 – 2017-07-08 (×2): 18 ug via RESPIRATORY_TRACT
  Filled 2017-07-06: qty 5

## 2017-07-06 MED ORDER — ONDANSETRON HCL 4 MG PO TABS: 4.0000 mg | ORAL_TABLET | Freq: Four times a day (QID) | ORAL | Status: DC | PRN

## 2017-07-06 MED ORDER — CITALOPRAM HYDROBROMIDE 20 MG PO TABS
20.0000 mg | ORAL_TABLET | Freq: Every day | ORAL | Status: DC
  Administered 2017-07-06 – 2017-07-08 (×3): 20 mg via ORAL
  Filled 2017-07-06 (×3): qty 1

## 2017-07-06 MED ORDER — SODIUM CHLORIDE 0.9 % IV BOLUS (SEPSIS)
500.0000 mL | Freq: Once | INTRAVENOUS | Status: AC
  Administered 2017-07-06: 500 mL via INTRAVENOUS

## 2017-07-06 MED ORDER — FENTANYL CITRATE (PF) 100 MCG/2ML IJ SOLN
50.0000 ug | Freq: Once | INTRAMUSCULAR | Status: AC
  Administered 2017-07-06: 50 ug via INTRAVENOUS
  Filled 2017-07-06: qty 2

## 2017-07-06 MED ORDER — FENTANYL CITRATE (PF) 100 MCG/2ML IJ SOLN
25.0000 ug | Freq: Once | INTRAMUSCULAR | Status: AC
Start: 2017-07-06 — End: 2017-07-06
  Administered 2017-07-06: 25 ug via INTRAVENOUS

## 2017-07-06 MED ORDER — ALBUTEROL SULFATE (2.5 MG/3ML) 0.083% IN NEBU
2.5000 mg | INHALATION_SOLUTION | Freq: Four times a day (QID) | RESPIRATORY_TRACT | Status: DC | PRN
Start: 2017-07-06 — End: 2017-07-09

## 2017-07-06 MED ORDER — ISOSORBIDE MONONITRATE ER 30 MG PO TB24
45.0000 mg | ORAL_TABLET | Freq: Every day | ORAL | Status: DC
  Administered 2017-07-06 – 2017-07-09 (×4): 45 mg via ORAL
  Filled 2017-07-06 (×4): qty 2

## 2017-07-06 MED ORDER — METOPROLOL TARTRATE 5 MG/5ML IV SOLN: 5.0000 mg | Freq: Four times a day (QID) | INTRAVENOUS | Status: DC | PRN

## 2017-07-06 NOTE — H&P (Signed)
History and Physical    HELAYNE EFFINGER HWK:[id number] DOB: Aug 16, 1924 DOA: 07/06/2017  PCP: Kari Baars, MD  Patient coming from: home  Chief Complaint:  Abdominal pain  HPI: MONTESSA GARSKE is a 83 y.o. female with medical history significant of abdominal aneurysm evaluated by both vascular at [facility] and then referred to [facility] vascular in 2013.  Pt has been deemed not a good surgical candidate.  Pt comes in tonight with acute right back/flank pain.  No n/v/d.  No fevers. Pt found to have possible early ruptured and enlarging infrarenal aneurysm.  Pt sbp was over 190 and has been started on a cardene drip.  Dr Jodelle Gross knapp called both [facility], [facility] fields reported [facility] did not have [facility] to fix the anuerysm and [facility] vascular advised pt would not be a surgical candidate but they would take her in transfer if she so wished.  Per Dr. Jodelle Gross knapp report per her discussion with patient and family they did not want surgical intervention and understood the severity of her illness.  Pt referred for admission for bp control, to proceed with a watch and wait approach.  Her son has been called and is without a vehicle but is walking to the hospital now.   Review of Systems: As per HPI otherwise 10 point review of systems negative.   Past Medical History:  Diagnosis Date  . AAA (abdominal aortic aneurysm) (HCC)   . Anxiety   . Arthritis   . CHF (congestive heart failure) (HCC) 11/09/2015  . COPD (chronic obstructive pulmonary disease) (HCC)   . Depression   . Hypertension   . Leg swelling   . Myocardial infarction (HCC)   . Neuropathy   . Oxygen dependent   . Pneumonia   . Stroke [facility])     Past Surgical History:  Procedure Laterality Date  . APPENDECTOMY    . CHOLECYSTECTOMY       reports that she quit smoking about 6 years ago. Her smoking use included Cigarettes. She quit after 50.00 years of use. She quit smokeless tobacco use about 6 years ago. She reports that she does not drink  alcohol or use drugs.  Allergies  Allergen Reactions  . Ciprofloxacin Hcl Nausea And Vomiting  . Nsaids Other (See Comments)    Advised by MD not to take this class of medication.  Pt is able to take coated Aspirin.    . Other Other (See Comments)    Spices trigger acid reflux.   . Sulfa Antibiotics Nausea And Vomiting  . Sulfur Nausea And Vomiting  . Tomato Other (See Comments)    Reaction:  Acid reflux    Family History  Problem Relation Age of Onset  . Diabetes Brother   . Cancer Brother        Throat cancer, Heart attack  . Heart disease Brother   . Heart attack Brother   . Cancer Father   . Cancer Sister     Prior to Admission medications   Medication Sig Start Date End Date Taking? Authorizing Provider  acetaminophen (TYLENOL) 500 MG tablet Take 250-500 mg by mouth every 6 (six) hours as needed for mild pain, moderate pain, fever or headache.     [provider]  albuterol (PROVENTIL) (2.5 MG/3ML) 0.083% nebulizer solution Take 2.5 mg by nebulization every 6 (six) hours as needed for wheezing or shortness of breath.     [provider]  ALPRAZolam Prudy Feeler) 1 MG tablet Take 1  mg by mouth 3 (three) times daily as needed.     [provider]  aspirin EC 81 MG tablet Take 81 mg by mouth daily.    [provider]  budesonide-formoterol (SYMBICORT) 160-4.5 MCG/ACT inhaler Inhale 2 puffs into the lungs 2 (two) times daily.     [provider]  citalopram (CELEXA) 20 MG tablet Take 20 mg by mouth at bedtime.     [provider]  gabapentin (NEURONTIN) 100 MG capsule Take 100 mg by mouth 5 (five) times daily.     [provider]  isosorbide mononitrate (IMDUR) 30 MG 24 hr tablet Take 1.5 tablets (45 mg total) by mouth daily. 06/27/17   Kari Baars, MD  nitroGLYCERIN (NITROSTAT) 0.4 MG SL tablet Place 0.4 mg under the tongue every 5 (five) minutes as needed for chest pain. 08/17/16   [provider]  oxyCODONE  (ROXICODONE) 15 MG immediate release tablet Take 1 tablet by mouth 4 (four) times daily. 06/21/17   [provider]  predniSONE (STERAPRED UNI-PAK 21 TAB) 10 MG (21) TBPK tablet Take by package instructions 06/27/17   Kari Baars, MD  tiotropium (SPIRIVA) 18 MCG inhalation capsule Place 18 mcg into inhaler and inhale at bedtime.     [provider]  torsemide (DEMADEX) 20 MG tablet Take 1 tablet (20 mg total) by mouth daily. 06/27/17   Kari Baars, MD  traMADol (ULTRAM) 50 MG tablet Take 1 tablet (50 mg total) by mouth every 6 (six) hours as needed. Patient taking differently: Take 50 mg by mouth every 6 (six) hours as needed for moderate pain.  02/10/17   [name], MD    Physical Exam: Vitals:   07/06/17 0330 07/06/17 0345 07/06/17 0400 07/06/17 0415  BP: (!) 145/78 (!) 143/81 (!) 141/84 134/86  Pulse: (!) 58 (!) 58 (!) 58 (!) 54  Resp: 15 16 11 17   Temp:      TempSrc:      SpO2: 96% 97% 96% 96%    Constitutional: NAD, calm, comfortable Vitals:   07/06/17 0330 07/06/17 0345 07/06/17 0400 07/06/17 0415  BP: (!) 145/78 (!) 143/81 (!) 141/84 134/86  Pulse: (!) 58 (!) 58 (!) 58 (!) 54  Resp: 15 16 11 17   Temp:      TempSrc:      SpO2: 96% 97% 96% 96%   Eyes: PERRL, lids and conjunctivae normal ENMT: Mucous membranes are moist. Posterior pharynx clear of any exudate or lesions.Normal dentition.  Neck: normal, supple, no masses, no thyromegaly Respiratory: clear to auscultation bilaterally, no wheezing, no crackles. Normal respiratory effort. No accessory muscle use.  Cardiovascular: Regular rate and rhythm, no murmurs / rubs / gallops. No extremity edema. 2+ pedal pulses. No carotid bruits.  Abdomen: no tenderness, no masses palpated. No hepatosplenomegaly. Bowel sounds positive.  Musculoskeletal: no clubbing / cyanosis. No joint deformity upper and lower extremities. Good ROM, no contractures. Normal muscle tone.  Skin: no rashes, lesions, ulcers. No  induration Neurologic: CN 2-12 grossly intact. Sensation intact, DTR normal. Strength 5/5 in all 4.  Psychiatric: Normal judgment and insight. Alert and oriented x 3. Normal mood.    Labs on Admission: I have personally reviewed following labs and imaging studies  CBC:  Recent Labs Lab 06/30/17 0015 07/06/17 0204  WBC 5.7 11.1*  NEUTROABS 3.5 9.0*  HGB 11.4* 11.8*  HCT 35.1* 35.4*  MCV 96.4 94.1  PLT 220 258   Basic Metabolic Panel:  Recent Labs Lab 06/30/17 0015 07/06/17  0204  NA 143 139  K 4.6 4.5  CL 105 104  CO2 29 27  GLUCOSE 157* 172*  BUN 48* 27*  CREATININE 1.39* 1.16*  CALCIUM 8.7* 9.2   GFR: Estimated Creatinine Clearance: 26.1 mL/min (A) (by C-G formula based on SCr of 1.16 mg/dL (H)). Liver Function Tests:  Recent Labs Lab 06/30/17 0015 07/06/17 0204  AST 27 18  ALT 25 24  ALKPHOS 62 80  BILITOT 0.4 0.6  PROT 6.3* 6.8  ALBUMIN 3.4* 3.7   No results for input(s): LIPASE, AMYLASE in the last 168 hours. No results for input(s): AMMONIA in the last 168 hours. Coagulation Profile: No results for input(s): INR, PROTIME in the last 168 hours. Cardiac Enzymes:  Recent Labs Lab 06/30/17 0015  CKTOTAL 30*  TROPONINI <0.03   BNP (last 3 results) No results for input(s): PROBNP in the last 8760 hours. HbA1C: No results for input(s): HGBA1C in the last 72 hours. CBG: No results for input(s): GLUCAP in the last 168 hours. Lipid Profile: No results for input(s): CHOL, HDL, LDLCALC, TRIG, CHOLHDL, LDLDIRECT in the last 72 hours. Thyroid Function Tests: No results for input(s): TSH, T4TOTAL, FREET4, T3FREE, THYROIDAB in the last 72 hours. Anemia Panel: No results for input(s): VITAMINB12, FOLATE, FERRITIN, TIBC, IRON, RETICCTPCT in the last 72 hours. Urine analysis:    Component Value Date/Time   COLORURINE YELLOW 07/06/2017 0221   APPEARANCEUR CLEAR 07/06/2017 0221   LABSPEC 1.016 07/06/2017 0221   PHURINE 7.0 07/06/2017 0221   GLUCOSEU 50  (A) 07/06/2017 0221   HGBUR SMALL (A) 07/06/2017 0221   BILIRUBINUR NEGATIVE 07/06/2017 0221   KETONESUR NEGATIVE 07/06/2017 0221   PROTEINUR NEGATIVE 07/06/2017 0221   UROBILINOGEN 0.2 11/02/2015 2014   NITRITE NEGATIVE 07/06/2017 0221   LEUKOCYTESUR NEGATIVE 07/06/2017 0221    Radiological Exams on Admission: Ct Renal Stone Study  Result Date: 07/06/2017 CLINICAL DATA:  Acute onset of left flank pain radiating into left lower quadrant. EXAM: CT ABDOMEN AND PELVIS WITHOUT CONTRAST TECHNIQUE: Multidetector CT imaging of the abdomen and pelvis was performed following the standard protocol without IV contrast. COMPARISON:  Aortic ultrasound 09/03/2016, abdominal CT 12/12/2015 FINDINGS: Lower chest: Ground-glass opacities in the lower lobes appear chronic and similar to prior exam. No pleural fluid. There is cardiomegaly. Coronary artery calcifications. Hepatobiliary: No evidence of focal lesion allowing for lack contrast. Gallbladder surgically absent. Biliary prominence is unchanged from prior CT. Pancreas: Parenchymal atrophy. No ductal dilatation or inflammation. Spleen: Normal in size without focal abnormality. Adrenals/Urinary Tract: Left adrenal thickening is stable. Right adrenal gland is normal. There is no hydronephrosis. Left kidney displaced laterally by abdominal aortic aneurysm. Urinary bladder is physiologically distended. Stomach/Bowel: Moderate colonic diverticulosis distally, no acute diverticulitis. Small to moderate stool burden. No bowel obstruction, wall thickening or evidence of inflammation. Appendix surgically absent. Vascular/Lymphatic: Large saccular infrarenal abdominal aortic aneurysm measuring 7.9 x 8.4 cm, with indistinctness about the wall laterally and mild adjacent stranding. Findings concerning for early inflammation/rupture. Mural thrombus within the aneurysm sac. Aneurysm sac originates at the level of the renal arteries when compared with prior contrast-enhanced CT.  Aneurysms and distally returns T relatively normal aortic caliber with more distal aneurysmal dilatation of 3.2 cm. Right common iliac artery measures 16 mm, left common iliac artery measures 15 mm. There are diffuse atheromatous calcifications. Reproductive: Atrophic uterus, and difficult to differentiate from adjacent bowel loops. No adnexal mass. Other: No free fluid or ascites.  No free air. Musculoskeletal: Scoliosis and degenerative change  in the spine. There are no acute or suspicious osseous abnormalities. IMPRESSION: 1. Increased size of large saccular aortic aneurysm, currently measuring 8.4 x 7.9 cm (previously 8.1 x 7.7 cm on ultrasound and 7.5 x 7.4 cm on CT). Indistinct aneurysm wall with faint surrounding stranding suspicious for early inflammation/rupture. 2. Diffuse aortic and branch atherosclerosis. 3. Colonic diverticulosis without acute inflammation. Critical Value/emergent results were called by telephone at the time of interpretation on 07/06/2017 at 2:50 am to Dr. Devoria Albe , who verbally acknowledged these results. Electronically Signed   By: Rubye Oaks M.D.   On: 07/06/2017 03:01    Old chart reviewed Case discussed with dr knapp   Assessment/Plan 83 yo female with likely ruptured infrarenal aneurysm  Principal Problem:   Ruptured aneurysm of artery (HCC)- cont cardene drip for better bp control.  Give fentanyl q 2 hours prn and more if needed.  Pt wishes to defer all decisions to her son who is currently walking to the hospital.  Depending on how she does clinically the next 12 to 24 hours or so she may ultimately need hospice referral if her aneurysm does not fully rupture.   Active Problems:   Anxiety disorder- cont her xanax   Chronic respiratory failure with hypoxia (HCC)- stable   COPD (chronic obstructive pulmonary disease) (HCC)- stable   DNR (do not resuscitate)- noted   CKD (chronic kidney disease) stage 3, GFR 30-59 ml/min- stable     DVT  prophylaxis: none  Code Status:  DNR Family Communication:    Disposition Plan:  Per day team Consults called:  none Admission status:  admission   Dariann Huckaba A MD [facility]  If 7PM-7AM, please contact night-coverage [email or web address] Password [facility]  07/06/2017, 4:37 AM

## 2017-07-06 NOTE — ED Triage Notes (Signed)
Left flank pain that goes into groin on the left.  Pt vomited x 1 at home.

## 2017-07-06 NOTE — ED Provider Notes (Addendum)
[facility] DEPT Provider Note   CSN: [id number] Arrival date & time: 07/06/17  0132  Time seen 01:40 AM   History   Chief Complaint Chief Complaint  Patient presents with  . Flank Pain    HPI Melanie Lowe is a 83 y.o. female.  HPI  patient reports acute onset of left back pain radiating into her left lower quadrant that started about 10 PM tonight. She states it started acutely and she describes the pain is sharp. She has had nausea without vomiting. She states nothing she does makes the pain worse, nothing she does makes it feel better. She's had nausea but no vomiting. She states she's never had this before. She denies hematuria.  Patient has a history of a thoraco abdominal aneurysm.  PCP Kari Baars, MD   Past Medical History:  Diagnosis Date  . AAA (abdominal aortic aneurysm) (HCC)   . Anxiety   . Arthritis   . CHF (congestive heart failure) (HCC) 11/09/2015  . COPD (chronic obstructive pulmonary disease) (HCC)   . Depression   . Hypertension   . Leg swelling   . Myocardial infarction (HCC)   . Neuropathy   . Oxygen dependent   . Pneumonia   . Stroke [facility])     Patient Active Problem List   Diagnosis Date Noted  . Shortness of breath 06/25/2017  . Chronic diastolic CHF (congestive heart failure) (HCC) 06/25/2017  . Chronic pain syndrome 06/25/2017  . Elevated brain natriuretic peptide (BNP) level   . CKD (chronic kidney disease) stage 3, GFR 30-59 ml/min 09/02/2016  . Diastolic dysfunction 09/02/2016  . Malnutrition of moderate degree 04/13/2016  . COPD exacerbation (HCC) 04/11/2016  . Anxiety 04/11/2016  . PNA (pneumonia) 04/11/2016  . Acute on chronic diastolic heart failure (HCC) 11/12/2015  . CHF (congestive heart failure) (HCC) 11/09/2015  . Acute on chronic respiratory failure (HCC) 11/05/2015  . Palliative care encounter   . DNR (do not resuscitate)   . Diverticulitis 11/02/2015  . Acute diverticulitis 11/02/2015  . AKI (acute kidney  injury) (HCC) 11/02/2015  . Anemia 11/02/2015  . RLL pneumonia (HCC) 11/02/2015  . Pneumonia 04/12/2015  . Failure to thrive in adult 11/11/2014  . Acute encephalopathy 11/11/2014  . Generalized weakness 11/11/2014  . Hypokalemia 11/11/2014  . Bradycardia 11/11/2014  . Dehydration 01/26/2014  . Orthostatic dizziness 01/24/2014  . SOB (shortness of breath) 01/24/2014  . Chronic respiratory failure with hypoxia (HCC) 01/24/2014  . Cough with hemoptysis 01/24/2014  . COPD (chronic obstructive pulmonary disease) (HCC) 01/24/2014  . Acute renal failure (HCC) 01/24/2014  . Dizzy 01/24/2014  . Acute-on-chronic respiratory failure (HCC) 10/12/2012  . Chest pain 10/10/2012  . CAP (community acquired pneumonia) 10/10/2012  . Aortic aneurysm (HCC) 04/16/2012  . Chest pain 08/01/2011  . HYPERCALCEMIA 06/16/2008  . ALLERGIC RHINITIS 12/05/2007  . WEIGHT GAIN 11/07/2007  . PAIN IN JOINT, LOWER LEG 10/09/2007  . TOBACCO ABUSE 09/29/2007  . Abdominal aortic aneurysm (HCC) 09/29/2007  . Sciatica 09/24/2007  . BACK PAIN 09/24/2007  . Anxiety disorder 11/28/2006  . DEPRESSION 11/28/2006  . CATARACT NOS 11/28/2006  . Essential hypertension 11/28/2006  . MYOCARDIAL INFARCTION, HX OF 11/28/2006  . COPD 11/28/2006  . OVERACTIVE BLADDER 11/28/2006  . UTI'S, RECURRENT 11/28/2006  . LOW BACK PAIN 11/28/2006  . URINARY INCONTINENCE 11/28/2006    Past Surgical History:  Procedure Laterality Date  . APPENDECTOMY    . CHOLECYSTECTOMY      OB History    Gravida Para  Term Preterm AB Living   5 5 5          SAB TAB Ectopic Multiple Live Births                   Home Medications    Prior to Admission medications   Medication Sig Start Date End Date Taking? Authorizing Provider  acetaminophen (TYLENOL) 500 MG tablet Take 250-500 mg by mouth every 6 (six) hours as needed for mild pain, moderate pain, fever or headache.     [provider]  albuterol (PROVENTIL) (2.5 MG/3ML) 0.083%  nebulizer solution Take 2.5 mg by nebulization every 6 (six) hours as needed for wheezing or shortness of breath.     [provider]  ALPRAZolam Prudy Feeler) 1 MG tablet Take 1 mg by mouth 3 (three) times daily as needed.     [provider]  aspirin EC 81 MG tablet Take 81 mg by mouth daily.    [provider]  budesonide-formoterol (SYMBICORT) 160-4.5 MCG/ACT inhaler Inhale 2 puffs into the lungs 2 (two) times daily.     [provider]  citalopram (CELEXA) 20 MG tablet Take 20 mg by mouth at bedtime.     [provider]  gabapentin (NEURONTIN) 100 MG capsule Take 100 mg by mouth 5 (five) times daily.     [provider]  isosorbide mononitrate (IMDUR) 30 MG 24 hr tablet Take 1.5 tablets (45 mg total) by mouth daily. 06/27/17   Kari Baars, MD  nitroGLYCERIN (NITROSTAT) 0.4 MG SL tablet Place 0.4 mg under the tongue every 5 (five) minutes as needed for chest pain. 08/17/16   [provider]  oxyCODONE (ROXICODONE) 15 MG immediate release tablet Take 1 tablet by mouth 4 (four) times daily. 06/21/17   [provider]  predniSONE (STERAPRED UNI-PAK 21 TAB) 10 MG (21) TBPK tablet Take by package instructions 06/27/17   Kari Baars, MD  tiotropium (SPIRIVA) 18 MCG inhalation capsule Place 18 mcg into inhaler and inhale at bedtime.     [provider]  torsemide (DEMADEX) 20 MG tablet Take 1 tablet (20 mg total) by mouth daily. 06/27/17   Kari Baars, MD  traMADol (ULTRAM) 50 MG tablet Take 1 tablet (50 mg total) by mouth every 6 (six) hours as needed. Patient taking differently: Take 50 mg by mouth every 6 (six) hours as needed for moderate pain.  02/10/17   [name], MD    Family History Family History  Problem Relation Age of Onset  . Diabetes Brother   . Cancer Brother        Throat cancer, Heart attack  . Heart disease Brother   . Heart attack Brother   . Cancer Father   . Cancer Sister     Social  History Social History  Substance Use Topics  . Smoking status: Former Smoker    Years: 50.00    Types: Cigarettes    Quit date: 12/31/2010  . Smokeless tobacco: Former Neurosurgeon    Quit date: 12/31/2010  . Alcohol use No  lives at home Lives with son On oxygen   Allergies   Ciprofloxacin hcl; Nsaids; Other; Sulfa antibiotics; Sulfur; and Tomato   Review of Systems Review of Systems  All other systems reviewed and are negative.    Physical Exam Updated Vital Signs BP (!) 190/97 (BP Location: Right Arm)   Pulse (!) 48   Temp 97.8 F (36.6 C) (Oral)   Resp 20   SpO2 (!) 20%  Vital signs normal except for hypertension   Physical Exam  Constitutional: She is oriented to person, place, and time. She appears distressed.  Elderly female who appears painful, holding her left side.   HENT:  Head: Normocephalic and atraumatic.  Right Ear: External ear normal.  Left Ear: External ear normal.  Mouth/Throat: Oropharynx is clear and moist.  Eyes: Conjunctivae and EOM are normal. Pupils are equal, round, and reactive to light.  Neck: Normal range of motion. Neck supple.  Cardiovascular: Normal rate, regular rhythm and normal heart sounds.   Pulmonary/Chest: Effort normal and breath sounds normal. No respiratory distress.  Abdominal: Soft. There is tenderness in the left lower quadrant.  Genitourinary:  Genitourinary Comments: Tender left flank  Musculoskeletal: Normal range of motion.  Neurological: She is alert and oriented to person, place, and time. No cranial nerve deficit.  Skin: Skin is warm and dry. Capillary refill takes less than 2 seconds. No erythema.  Psychiatric: She has a normal mood and affect. Her behavior is normal. Thought content normal.  Nursing note and vitals reviewed.    ED Treatments / Results  Labs (all labs ordered are listed, but only abnormal results are displayed) Results for orders placed or performed during the hospital encounter of 07/06/17    Comprehensive metabolic panel  Result Value Ref Range   Sodium 139 135 - 145 mmol/L   Potassium 4.5 3.5 - 5.1 mmol/L   Chloride 104 101 - 111 mmol/L   CO2 27 22 - 32 mmol/L   Glucose, Bld 172 (H) 65 - 99 mg/dL   BUN 27 (H) 6 - 20 mg/dL   Creatinine, Ser 1.61 (H) 0.44 - 1.00 mg/dL   Calcium 9.2 8.9 - 09.6 mg/dL   Total Protein 6.8 6.5 - 8.1 g/dL   Albumin 3.7 3.5 - 5.0 g/dL   AST 18 15 - 41 U/L   ALT 24 14 - 54 U/L   Alkaline Phosphatase 80 38 - 126 U/L   Total Bilirubin 0.6 0.3 - 1.2 mg/dL   GFR calc non Af Amer 40 (L) >60 mL/min   GFR calc Af Amer 46 (L) >60 mL/min   Anion gap 8 5 - 15  CBC with Differential  Result Value Ref Range   WBC 11.1 (H) 4.0 - 10.5 K/uL   RBC 3.76 (L) 3.87 - 5.11 MIL/uL   Hemoglobin 11.8 (L) 12.0 - 15.0 g/dL   HCT 04.5 (L) 40.9 - 81.1 %   MCV 94.1 78.0 - 100.0 fL   MCH 31.4 26.0 - 34.0 pg   MCHC 33.3 30.0 - 36.0 g/dL   RDW 91.4 78.2 - 95.6 %   Platelets 258 150 - 400 K/uL   Neutrophils Relative % 81 %   Neutro Abs 9.0 (H) 1.7 - 7.7 K/uL   Lymphocytes Relative 10 %   Lymphs Abs 1.1 0.7 - 4.0 K/uL   Monocytes Relative 9 %   Monocytes Absolute 1.0 0.1 - 1.0 K/uL   Eosinophils Relative 0 %   Eosinophils Absolute 0.0 0.0 - 0.7 K/uL   Basophils Relative 0 %   Basophils Absolute 0.0 0.0 - 0.1 K/uL  Urinalysis, Routine w reflex microscopic  Result Value Ref Range   Color, Urine YELLOW YELLOW   APPearance CLEAR CLEAR   Specific Gravity, Urine 1.016 1.005 - 1.030   pH 7.0 5.0 - 8.0   Glucose, UA 50 (A) NEGATIVE mg/dL   Hgb urine dipstick SMALL (A) NEGATIVE   Bilirubin Urine NEGATIVE NEGATIVE  Ketones, ur NEGATIVE NEGATIVE mg/dL   Protein, ur NEGATIVE NEGATIVE mg/dL   Nitrite NEGATIVE NEGATIVE   Leukocytes, UA NEGATIVE NEGATIVE  Sample to Blood Bank  Result Value Ref Range   Blood Bank Specimen SAMPLE AVAILABLE FOR TESTING    Sample Expiration 07/07/2017     EKG  EKG Interpretation None       Radiology Ct Renal Stone  Study  Result Date: 07/06/2017 CLINICAL DATA:  Acute onset of left flank pain radiating into left lower quadrant. EXAM: CT ABDOMEN AND PELVIS WITHOUT CONTRAST TECHNIQUE: Multidetector CT imaging of the abdomen and pelvis was performed following the standard protocol without IV contrast. COMPARISON:  Aortic ultrasound 09/03/2016, abdominal CT 12/12/2015 FINDINGS: Lower chest: Ground-glass opacities in the lower lobes appear chronic and similar to prior exam. No pleural fluid. There is cardiomegaly. Coronary artery calcifications. Hepatobiliary: No evidence of focal lesion allowing for lack contrast. Gallbladder surgically absent. Biliary prominence is unchanged from prior CT. Pancreas: Parenchymal atrophy. No ductal dilatation or inflammation. Spleen: Normal in size without focal abnormality. Adrenals/Urinary Tract: Left adrenal thickening is stable. Right adrenal gland is normal. There is no hydronephrosis. Left kidney displaced laterally by abdominal aortic aneurysm. Urinary bladder is physiologically distended. Stomach/Bowel: Moderate colonic diverticulosis distally, no acute diverticulitis. Small to moderate stool burden. No bowel obstruction, wall thickening or evidence of inflammation. Appendix surgically absent. Vascular/Lymphatic: Large saccular infrarenal abdominal aortic aneurysm measuring 7.9 x 8.4 cm, with indistinctness about the wall laterally and mild adjacent stranding. Findings concerning for early inflammation/rupture. Mural thrombus within the aneurysm sac. Aneurysm sac originates at the level of the renal arteries when compared with prior contrast-enhanced CT. Aneurysms and distally returns T relatively normal aortic caliber with more distal aneurysmal dilatation of 3.2 cm. Right common iliac artery measures 16 mm, left common iliac artery measures 15 mm. There are diffuse atheromatous calcifications. Reproductive: Atrophic uterus, and difficult to differentiate from adjacent bowel loops. No  adnexal mass. Other: No free fluid or ascites.  No free air. Musculoskeletal: Scoliosis and degenerative change in the spine. There are no acute or suspicious osseous abnormalities. IMPRESSION: 1. Increased size of large saccular aortic aneurysm, currently measuring 8.4 x 7.9 cm (previously 8.1 x 7.7 cm on ultrasound and 7.5 x 7.4 cm on CT). Indistinct aneurysm wall with faint surrounding stranding suspicious for early inflammation/rupture. 2. Diffuse aortic and branch atherosclerosis. 3. Colonic diverticulosis without acute inflammation. Critical Value/emergent results were called by telephone at the time of interpretation on 07/06/2017 at 2:50 am to Dr. Devoria Albe , who verbally acknowledged these results. Electronically Signed   By: Rubye Oaks M.D.   On: 07/06/2017 03:01     Dg Chest 2 View  Result Date: 06/30/2017 CLINICAL DATA:  Shortness of breath and upper back pain between the shoulder bladesIMPRESSION: 1. Linear atelectasis or scarring at the bases 2. Stable cardiomegaly and aortic tortuosity. Electronically Signed   By: Jasmine Pang M.D.   On: 06/30/2017 00:19   Dg Chest 2 View  Result Date: 06/24/2017 CLINICAL DATA:  Left-sided chest pain and dyspnea, onset tonight. Several days history of productive cough. . IMPRESSION: Stable cardiomegaly and aortic tortuosity. No consolidation or effusion. Electronically Signed   By: Ellery Plunk M.D.   On: 06/24/2017 23:26   Ct Angio Chest Pe W And/or Wo Contrast  Result Date: 06/25/2017 CLINICAL DATA:  83 y/o F; shortness of breath and elevated D-dimer. History of COPD.  IMPRESSION: 1. No pulmonary embolus identified. 2. Stable 4.6 cm ascending thoracic  aortic aneurysm. Ascending thoracic aortic aneurysm. Recommend semi-annual imaging followup by CTA or MRA and referral to cardiothoracic surgery if not already obtained. This recommendation follows 2010 ACCF/AHA/AATS/ACR/ASA/SCA/SCAI/SIR/STS/SVM Guidelines for the Diagnosis and Management of  Patients With Thoracic Aortic Disease. Circulation. 2010; 121: Z610-R604 3. Stable penetrating ulcer arising from the left lateral wall of the aortic arch. 4. Stable moderate to severe centrilobular emphysema of the lungs. 5. Diffuse peribronchial thickening and scattered mucous plugging in the lung bases is compatible with acute bronchitis. 6. No consolidation to suggest pneumonia. Electronically Signed   By: Mitzi Hansen M.D.   On: 06/25/2017 03:13      Procedures Procedures (including critical care time)  CRITICAL CARE Performed by: Eason Housman L Donte Lenzo Total critical care time: 42 minutes Critical care time was exclusive of separately billable procedures and treating other patients. Critical care was necessary to treat or prevent imminent or life-threatening deterioration. Critical care was time spent personally by me on the following activities: development of treatment plan with patient and/or surrogate as well as nursing, discussions with consultants, evaluation of patient's response to treatment, examination of patient, obtaining history from patient or surrogate, ordering and performing treatments and interventions, ordering and review of laboratory studies, ordering and review of radiographic studies, pulse oximetry and re-evaluation of patient's condition.   Medications Ordered in ED Medications  fentaNYL (SUBLIMAZE) injection 50 mcg (50 mcg Intravenous Given 07/06/17 0200)  ondansetron (ZOFRAN) injection 4 mg (4 mg Intravenous Given 07/06/17 0200)  nicardipine (CARDENE) 20mg  in 0.86% saline IV infusion (0.1 mg/ml) (5 mg/hr Intravenous New Bag/Given 07/06/17 0303)     Initial Impression / Assessment and Plan / ED Course  I have reviewed the triage vital signs and the nursing notes.  Pertinent labs & imaging results that were available during my care of the patient were reviewed by me and considered in my medical decision making (see chart for details).  Patient was given IV  pain and nausea medication. CT scan was done for possible kidney stone, but consideration was made for a AAA.  02:49 AM Dr Manus Gunning, radiology called her Renal CT results.   Patient was last admitted at the end of June for chest pain. At that time she had a cardiology consult to give an opinion on her thoracic aneurysm. At that time the felt she was not a surgical candidate.  Patient was started on Cardene for blood pressure control.  03:03 AM Dr Darrick Penna, vascular states patient is not an open surgical candidate and they do not have a graft that could fix her aneurysm.  Patient was last seen by Dr. Durwin Nora in 2013, at that time she was referred to [facility], to see Dr. Pattricia Boss. I look at his visit in April 2013 I see the results of scans she had however there is no discussion in the record about their visit.  3:10 AM I have talked to patient about her test results. I discussed this is very serious and unfortunately she is not a candidate to have it fixed. She was asked what the conversation was with the specialist at [facility] in 2013. She states he told her to discuss it with her PCP and do what he thought was best. She was encouraged to call her family to come in. She gave me the number of her son.  03:22 AM Dr Onalee Hua, hospitalist will admit  03:30 AM Dr Onalee Hua has called back, now she wants an opinion from vascular at [facility]  03:48 AM Dr Marita Snellen,  cardiology at [facility], states he will have me in touch with vascular  04:03 AM Dr Pattricia Boss, vascular surgery at [facility], states they have minimally invasive procedures but they are still very risky at her age and her underlying medical condition. He states if the family states they want the risk, he will accept her for treatment, but he feels she will not have a good outcome.  On the Cardene drip patient's blood pressure improved fairly rapidly to 143/81. Her pulse remained 58 at 3:45 AM.  I had a discussion with the patient about DO NOT RESUSCITATE status and she states she  doesn't know what she wants to do. She also doesn't know what she wants to do about surgery if it was offered. She states "ask my son".  04:10 AM Discussed with her son, he states they have known the aneurysm was getting bigger and have seen many specialists who all agree she is not a good surgical candidate. He was informed she is terminal and is getting comfort care. He is in agreement with that. He is going to walk to the hospital to be with her, his car is broken.   04:25 AM Dr Onalee Hua, will admit  Final Clinical Impressions(s) / ED Diagnoses   Final diagnoses:  AAA (abdominal aortic aneurysm, ruptured) [facility]  Essential hypertension    Plan admission   Devoria Albe, MD, Concha Pyo, MD 07/06/17 1610    Devoria Albe, MD 07/06/17 [id number]

## 2017-07-06 NOTE — ED Notes (Signed)
Patient transported to CT 

## 2017-07-06 NOTE — Progress Notes (Signed)
Dr. Juanetta Gosling aware pt + nasal MRSA

## 2017-07-06 NOTE — Progress Notes (Signed)
This is an assumption of care note. This is a 83 year old who has a long known history of very large abdominal aortic aneurysm and who has not been felt to be a surgical candidate as far back as 5 years ago because of coronary disease severe COPD and generalized weakness. She came to the emergency department with abdominal pain and underwent CT scan actually looking for renal stone. She did not have a stone but she did have possibly early ruptured an enlarging infrarenal abdominal aortic aneurysm. Her blood pressure was very high and she was started on Cardene. She says this morning she's not having any pain. Her abdomen is actually not tender now. Her blood pressure is actually somewhat low now. My plan will be to discontinue Cardene start her on metoprolol by mouth and IV if needed watch her blood pressure carefully probably reimage her on the night depending on how she does. She has DO NOT RESUSCITATE status which is appropriate.

## 2017-07-06 NOTE — Progress Notes (Signed)
SBP noted to be fluctuating between 60's-90's mmhg. Pt denies dizziness, SOB, pain, reports feeling "tired." Messages left for  Dr. Janna Arch, weekend coverage. Awaiting call back.

## 2017-07-07 NOTE — Progress Notes (Signed)
Subjective: She was pretty unstable yesterday with blood pressure that initially was very high and then very low. She says this morning she feels okay. Blood pressure in the 115- 315 range systolic. No complaints of abdominal pain.  Objective: Vital signs in last 24 hours: Temp:  [97.3 F (36.3 C)-98.9 F (37.2 C)] 98.9 F (37.2 C) (07/08 0400) Pulse Rate:  [45-86] 57 (07/08 0600) Resp:  [0-23] 13 (07/08 0600) BP: (62-123)/(39-77) 115/66 (07/08 0600) SpO2:  [83 %-99 %] 96 % (07/08 0600) Weight:  [59.3 kg (130 lb 11.7 oz)] 59.3 kg (130 lb 11.7 oz) (07/08 0500) Weight change: 1.4 kg (3 lb 1.4 oz) Last BM Date: 07/05/17  Intake/Output from previous day: 07/07 0701 - 07/08 0700 In: 650 [P.O.:650] Out: -   PHYSICAL EXAM General appearance: alert, cooperative and no distress Resp: clear to auscultation bilaterally Cardio: regular rate and rhythm, S1, S2 normal, no murmur, click, rub or gallop GI: soft, non-tender; bowel sounds normal; no masses,  no organomegaly Extremities: extremities normal, atraumatic, no cyanosis or edema Skin warm and dry  Lab Results:  Results for orders placed or performed during the hospital encounter of 07/06/17 (from the past 48 hour(s))  Comprehensive metabolic panel     Status: Abnormal   Collection Time: 07/06/17  2:04 AM  Result Value Ref Range   Sodium 139 135 - 145 mmol/L   Potassium 4.5 3.5 - 5.1 mmol/L   Chloride 104 101 - 111 mmol/L   CO2 27 22 - 32 mmol/L   Glucose, Bld 172 (H) 65 - 99 mg/dL   BUN 27 (H) 6 - 20 mg/dL   Creatinine, Ser 1.16 (H) 0.44 - 1.00 mg/dL   Calcium 9.2 8.9 - 10.3 mg/dL   Total Protein 6.8 6.5 - 8.1 g/dL   Albumin 3.7 3.5 - 5.0 g/dL   AST 18 15 - 41 U/L   ALT 24 14 - 54 U/L   Alkaline Phosphatase 80 38 - 126 U/L   Total Bilirubin 0.6 0.3 - 1.2 mg/dL   GFR calc non Af Amer 40 (L) >60 mL/min   GFR calc Af Amer 46 (L) >60 mL/min    Comment: (NOTE) The eGFR has been calculated using the CKD EPI equation. This  calculation has not been validated in all clinical situations. eGFR's persistently <60 mL/min signify possible Chronic Kidney Disease.    Anion gap 8 5 - 15  CBC with Differential     Status: Abnormal   Collection Time: 07/06/17  2:04 AM  Result Value Ref Range   WBC 11.1 (H) 4.0 - 10.5 K/uL   RBC 3.76 (L) 3.87 - 5.11 MIL/uL   Hemoglobin 11.8 (L) 12.0 - 15.0 g/dL   HCT 35.4 (L) 36.0 - 46.0 %   MCV 94.1 78.0 - 100.0 fL   MCH 31.4 26.0 - 34.0 pg   MCHC 33.3 30.0 - 36.0 g/dL   RDW 13.3 11.5 - 15.5 %   Platelets 258 150 - 400 K/uL   Neutrophils Relative % 81 %   Neutro Abs 9.0 (H) 1.7 - 7.7 K/uL   Lymphocytes Relative 10 %   Lymphs Abs 1.1 0.7 - 4.0 K/uL   Monocytes Relative 9 %   Monocytes Absolute 1.0 0.1 - 1.0 K/uL   Eosinophils Relative 0 %   Eosinophils Absolute 0.0 0.0 - 0.7 K/uL   Basophils Relative 0 %   Basophils Absolute 0.0 0.0 - 0.1 K/uL  Urinalysis, Routine w reflex microscopic     Status:  Abnormal   Collection Time: 07/06/17  2:21 AM  Result Value Ref Range   Color, Urine YELLOW YELLOW   APPearance CLEAR CLEAR   Specific Gravity, Urine 1.016 1.005 - 1.030   pH 7.0 5.0 - 8.0   Glucose, UA 50 (A) NEGATIVE mg/dL   Hgb urine dipstick SMALL (A) NEGATIVE   Bilirubin Urine NEGATIVE NEGATIVE   Ketones, ur NEGATIVE NEGATIVE mg/dL   Protein, ur NEGATIVE NEGATIVE mg/dL   Nitrite NEGATIVE NEGATIVE   Leukocytes, UA NEGATIVE NEGATIVE  Sample to Blood Bank     Status: None   Collection Time: 07/06/17  2:52 AM  Result Value Ref Range   Blood Bank Specimen SAMPLE AVAILABLE FOR TESTING    Sample Expiration 07/07/2017   MRSA PCR Screening     Status: Abnormal   Collection Time: 07/06/17  5:15 AM  Result Value Ref Range   MRSA by PCR POSITIVE (A) NEGATIVE    Comment:        The GeneXpert MRSA Assay (FDA approved for NASAL specimens only), is one component of a comprehensive MRSA colonization surveillance program. It is not intended to diagnose MRSA infection nor to  guide or monitor treatment for MRSA infections. RESULT CALLED TO, READ BACK BY AND VERIFIED WITH: SPANGER,E AT 0919 ON 07/06/2017 BY MOSLEY,J     ABGS No results for input(s): PHART, PO2ART, TCO2, HCO3 in the last 72 hours.  Invalid input(s): PCO2 CULTURES Recent Results (from the past 240 hour(s))  MRSA PCR Screening     Status: Abnormal   Collection Time: 07/06/17  5:15 AM  Result Value Ref Range Status   MRSA by PCR POSITIVE (A) NEGATIVE Final    Comment:        The GeneXpert MRSA Assay (FDA approved for NASAL specimens only), is one component of a comprehensive MRSA colonization surveillance program. It is not intended to diagnose MRSA infection nor to guide or monitor treatment for MRSA infections. RESULT CALLED TO, READ BACK BY AND VERIFIED WITH: SPANGER,E AT 0919 ON 07/06/2017 BY MOSLEY,J    Studies/Results: Ct Renal Stone Study  Result Date: 07/06/2017 CLINICAL DATA:  Acute onset of left flank pain radiating into left lower quadrant. EXAM: CT ABDOMEN AND PELVIS WITHOUT CONTRAST TECHNIQUE: Multidetector CT imaging of the abdomen and pelvis was performed following the standard protocol without IV contrast. COMPARISON:  Aortic ultrasound 09/03/2016, abdominal CT 12/12/2015 FINDINGS: Lower chest: Ground-glass opacities in the lower lobes appear chronic and similar to prior exam. No pleural fluid. There is cardiomegaly. Coronary artery calcifications. Hepatobiliary: No evidence of focal lesion allowing for lack contrast. Gallbladder surgically absent. Biliary prominence is unchanged from prior CT. Pancreas: Parenchymal atrophy. No ductal dilatation or inflammation. Spleen: Normal in size without focal abnormality. Adrenals/Urinary Tract: Left adrenal thickening is stable. Right adrenal gland is normal. There is no hydronephrosis. Left kidney displaced laterally by abdominal aortic aneurysm. Urinary bladder is physiologically distended. Stomach/Bowel: Moderate colonic diverticulosis  distally, no acute diverticulitis. Small to moderate stool burden. No bowel obstruction, wall thickening or evidence of inflammation. Appendix surgically absent. Vascular/Lymphatic: Large saccular infrarenal abdominal aortic aneurysm measuring 7.9 x 8.4 cm, with indistinctness about the wall laterally and mild adjacent stranding. Findings concerning for early inflammation/rupture. Mural thrombus within the aneurysm sac. Aneurysm sac originates at the level of the renal arteries when compared with prior contrast-enhanced CT. Aneurysms and distally returns T relatively normal aortic caliber with more distal aneurysmal dilatation of 3.2 cm. Right common iliac artery measures 16 mm, left common  iliac artery measures 15 mm. There are diffuse atheromatous calcifications. Reproductive: Atrophic uterus, and difficult to differentiate from adjacent bowel loops. No adnexal mass. Other: No free fluid or ascites.  No free air. Musculoskeletal: Scoliosis and degenerative change in the spine. There are no acute or suspicious osseous abnormalities. IMPRESSION: 1. Increased size of large saccular aortic aneurysm, currently measuring 8.4 x 7.9 cm (previously 8.1 x 7.7 cm on ultrasound and 7.5 x 7.4 cm on CT). Indistinct aneurysm wall with faint surrounding stranding suspicious for early inflammation/rupture. 2. Diffuse aortic and branch atherosclerosis. 3. Colonic diverticulosis without acute inflammation. Critical Value/emergent results were called by telephone at the time of interpretation on 07/06/2017 at 2:50 am to Dr. Rolland Porter , who verbally acknowledged these results. Electronically Signed   By: Jeb Levering M.D.   On: 07/06/2017 03:01    Medications:  Prior to Admission:  Prescriptions Prior to Admission  Medication Sig Dispense Refill Last Dose  . acetaminophen (TYLENOL) 500 MG tablet Take 250-500 mg by mouth every 6 (six) hours as needed for mild pain, moderate pain, fever or headache.    Past Week at Unknown time   . albuterol (PROVENTIL) (2.5 MG/3ML) 0.083% nebulizer solution Take 2.5 mg by nebulization every 6 (six) hours as needed for wheezing or shortness of breath.    Past Week at Unknown time  . ALPRAZolam (XANAX) 1 MG tablet Take 1 mg by mouth 3 (three) times daily as needed.    06/24/2017 at Unknown time  . aspirin EC 81 MG tablet Take 81 mg by mouth daily.   06/24/2017 at Unknown time  . budesonide-formoterol (SYMBICORT) 160-4.5 MCG/ACT inhaler Inhale 2 puffs into the lungs 2 (two) times daily.    06/24/2017 at Unknown time  . citalopram (CELEXA) 20 MG tablet Take 20 mg by mouth at bedtime.    06/24/2017 at Unknown time  . gabapentin (NEURONTIN) 100 MG capsule Take 100 mg by mouth 5 (five) times daily.    06/24/2017 at Unknown time  . isosorbide mononitrate (IMDUR) 30 MG 24 hr tablet Take 1.5 tablets (45 mg total) by mouth daily. 30 tablet 12   . nitroGLYCERIN (NITROSTAT) 0.4 MG SL tablet Place 0.4 mg under the tongue every 5 (five) minutes as needed for chest pain.   Past Month at Unknown time  . oxyCODONE (ROXICODONE) 15 MG immediate release tablet Take 1 tablet by mouth 4 (four) times daily.   06/24/2017 at Unknown time  . oxyCODONE-acetaminophen (PERCOCET) 10-325 MG tablet Take 1 tablet by mouth every 6 (six) hours as needed for pain.      . predniSONE (STERAPRED UNI-PAK 21 TAB) 10 MG (21) TBPK tablet Take by package instructions 21 tablet 0   . tiotropium (SPIRIVA) 18 MCG inhalation capsule Place 18 mcg into inhaler and inhale at bedtime.    06/24/2017 at Unknown time  . torsemide (DEMADEX) 20 MG tablet Take 1 tablet (20 mg total) by mouth daily. 60 tablet 12   . traMADol (ULTRAM) 50 MG tablet Take 1 tablet (50 mg total) by mouth every 6 (six) hours as needed. (Patient taking differently: Take 50 mg by mouth every 6 (six) hours as needed for moderate pain. ) 20 tablet 0 06/24/2017 at Unknown time   Scheduled: . citalopram  20 mg Oral QHS  . gabapentin  100 mg Oral 5 X Daily  . isosorbide mononitrate   45 mg Oral Daily  . metoprolol tartrate  12.5 mg Oral BID  . oxyCODONE  15 mg  Oral QID  . sodium chloride flush  3 mL Intravenous Q12H  . tiotropium  18 mcg Inhalation QHS   Continuous: . sodium chloride Stopped (07/06/17 0532)  . niCARDipine Stopped (07/06/17 0857)   QHU:TMLYYT chloride, albuterol, ALPRAZolam, fentaNYL (SUBLIMAZE) injection, metoprolol tartrate, ondansetron **OR** ondansetron (ZOFRAN) IV, sodium chloride flush  Assesment: She was admitted with presumed expanding or ruptured or leaking abdominal aortic aneurysm. She had a noncontrasted CT that showed this. She's doing better now. She initially was very hypertensive but then developed hypotension yesterday afternoon requiring fluid resuscitation. She looks good today but I think she probably needs to stay in stepdown just to monitor blood pressure for now. I will advance her diet. She will have some sort of imaging study tomorrow to get a better picture of what's going on with the aneurysm. Principal Problem:   Ruptured aneurysm of artery (HCC) Active Problems:   Anxiety disorder   Abdominal aortic aneurysm (HCC)   Chronic respiratory failure with hypoxia (HCC)   COPD (chronic obstructive pulmonary disease) (HCC)   DNR (do not resuscitate)   CKD (chronic kidney disease) stage 3, GFR 30-59 ml/min    Plan: As above    LOS: 1 day   Rowen Hur L 07/07/2017, 7:07 AM

## 2017-07-07 NOTE — Progress Notes (Signed)
2200 sprivia not given due to patient being asleep as she was confused earlier.and is being allowed to sleep

## 2017-07-08 ENCOUNTER — Encounter (HOSPITAL_COMMUNITY): Payer: Self-pay | Admitting: Primary Care

## 2017-07-08 DIAGNOSIS — Z7189 Other specified counseling: Secondary | ICD-10-CM

## 2017-07-08 DIAGNOSIS — Z515 Encounter for palliative care: Secondary | ICD-10-CM

## 2017-07-08 DIAGNOSIS — J9611 Chronic respiratory failure with hypoxia: Secondary | ICD-10-CM

## 2017-07-08 LAB — CBC WITH DIFFERENTIAL/PLATELET
BASOS ABS: 0 10*3/uL (ref 0.0–0.1)
BASOS PCT: 0 %
Eosinophils Absolute: 0.1 10*3/uL (ref 0.0–0.7)
Eosinophils Relative: 0 %
HEMATOCRIT: 24.8 % — AB (ref 36.0–46.0)
Hemoglobin: 8.2 g/dL — ABNORMAL LOW (ref 12.0–15.0)
Lymphocytes Relative: 12 %
Lymphs Abs: 1.3 10*3/uL (ref 0.7–4.0)
MCH: 31.5 pg (ref 26.0–34.0)
MCHC: 33.1 g/dL (ref 30.0–36.0)
MCV: 95.4 fL (ref 78.0–100.0)
MONOS PCT: 12 %
Monocytes Absolute: 1.3 10*3/uL — ABNORMAL HIGH (ref 0.1–1.0)
NEUTROS ABS: 8.6 10*3/uL — AB (ref 1.7–7.7)
NEUTROS PCT: 76 %
Platelets: 173 10*3/uL (ref 150–400)
RBC: 2.6 MIL/uL — ABNORMAL LOW (ref 3.87–5.11)
RDW: 13.2 % (ref 11.5–15.5)
WBC: 11.3 10*3/uL — AB (ref 4.0–10.5)

## 2017-07-08 LAB — BASIC METABOLIC PANEL
ANION GAP: 6 (ref 5–15)
BUN: 27 mg/dL — ABNORMAL HIGH (ref 6–20)
CHLORIDE: 102 mmol/L (ref 101–111)
CO2: 27 mmol/L (ref 22–32)
Calcium: 8.6 mg/dL — ABNORMAL LOW (ref 8.9–10.3)
Creatinine, Ser: 1.2 mg/dL — ABNORMAL HIGH (ref 0.44–1.00)
GFR calc non Af Amer: 38 mL/min — ABNORMAL LOW (ref >60)
GFR, EST AFRICAN AMERICAN: 44 mL/min — AB (ref >60)
Glucose, Bld: 111 mg/dL — ABNORMAL HIGH (ref 65–99)
POTASSIUM: 4.1 mmol/L (ref 3.5–5.1)
Sodium: 135 mmol/L (ref 135–145)

## 2017-07-08 MED ORDER — CHLORHEXIDINE GLUCONATE CLOTH 2 % EX PADS
6.0000 | MEDICATED_PAD | Freq: Every day | CUTANEOUS | Status: DC
  Administered 2017-07-08 – 2017-07-09 (×2): 6 via TOPICAL

## 2017-07-08 MED ORDER — MUPIROCIN 2 % EX OINT
1.0000 "application " | TOPICAL_OINTMENT | Freq: Two times a day (BID) | CUTANEOUS | Status: DC
  Administered 2017-07-08 – 2017-07-09 (×2): 1 via NASAL
  Filled 2017-07-08 (×2): qty 22

## 2017-07-08 NOTE — Care Management Note (Signed)
Case Management Note  Patient Details  Name: Melanie Lowe MRN: [id number] Date of Birth: 04-Apr-1924  Subjective/Objective:  Adm with ruptured aneurysm. On Cardene  Infusion. Chart reviewed. No CM consult. From home with family. Palliative consult ongoing. ? Home with hospice if family agrees.               Action/Plan: CM following for needs.   Expected Discharge Date:    07/09/2017              Expected Discharge Plan:     In-House Referral:     Discharge planning Services  CM Consult  Post Acute Care Choice:    Choice offered to:     DME Arranged:    DME Agency:     HH Arranged:    HH Agency:     Status of Service:  In process, will continue to follow  If discussed at Long Length of Stay Meetings, dates discussed:    Additional Comments:  Sullivan Blasing, Chrystine Oiler, RN 07/08/2017, 1:44 PM

## 2017-07-08 NOTE — Progress Notes (Signed)
Subjective: She says she feels okay. No new complaints. She was confused yesterday. She is sleepy today. No chest pain or abdominal pain. Her blood pressure has been fairly stable.  Objective: Vital signs in last 24 hours: Temp:  [97.5 F (36.4 C)-99 F (37.2 C)] 98.2 F (36.8 C) (07/09 0500) Pulse Rate:  [54-223] 54 (07/09 0700) Resp:  [12-21] 16 (07/09 0700) BP: (79-117)/(56-90) 115/65 (07/09 0700) SpO2:  [74 %-99 %] 98 % (07/09 0700) Weight:  [61.1 kg (134 lb 11.2 oz)] 61.1 kg (134 lb 11.2 oz) (07/09 0500) Weight change: 1.8 kg (3 lb 15.5 oz) Last BM Date: 07/05/17  Intake/Output from previous day: 07/08 0701 - 07/09 0700 In: 480 [P.O.:480] Out: 500 [Urine:500]  PHYSICAL EXAM General appearance: alert, cooperative and no distress Resp: clear to auscultation bilaterally Cardio: regular rate and rhythm, S1, S2 normal, no murmur, click, rub or gallop GI: soft, non-tender; bowel sounds normal; no masses,  no organomegaly Extremities: extremities normal, atraumatic, no cyanosis or edema Mucous membranes are moist  Lab Results:  Results for orders placed or performed during the hospital encounter of 07/06/17 (from the past 48 hour(s))  CBC with Differential/Platelet     Status: Abnormal   Collection Time: 07/08/17  5:14 AM  Result Value Ref Range   WBC 11.3 (H) 4.0 - 10.5 K/uL   RBC 2.60 (L) 3.87 - 5.11 MIL/uL   Hemoglobin 8.2 (L) 12.0 - 15.0 g/dL   HCT 24.8 (L) 36.0 - 46.0 %   MCV 95.4 78.0 - 100.0 fL   MCH 31.5 26.0 - 34.0 pg   MCHC 33.1 30.0 - 36.0 g/dL   RDW 13.2 11.5 - 15.5 %   Platelets 173 150 - 400 K/uL   Neutrophils Relative % 76 %   Neutro Abs 8.6 (H) 1.7 - 7.7 K/uL   Lymphocytes Relative 12 %   Lymphs Abs 1.3 0.7 - 4.0 K/uL   Monocytes Relative 12 %   Monocytes Absolute 1.3 (H) 0.1 - 1.0 K/uL   Eosinophils Relative 0 %   Eosinophils Absolute 0.1 0.0 - 0.7 K/uL   Basophils Relative 0 %   Basophils Absolute 0.0 0.0 - 0.1 K/uL  Basic metabolic panel      Status: Abnormal   Collection Time: 07/08/17  5:14 AM  Result Value Ref Range   Sodium 135 135 - 145 mmol/L   Potassium 4.1 3.5 - 5.1 mmol/L   Chloride 102 101 - 111 mmol/L   CO2 27 22 - 32 mmol/L   Glucose, Bld 111 (H) 65 - 99 mg/dL   BUN 27 (H) 6 - 20 mg/dL   Creatinine, Ser 1.20 (H) 0.44 - 1.00 mg/dL   Calcium 8.6 (L) 8.9 - 10.3 mg/dL   GFR calc non Af Amer 38 (L) >60 mL/min   GFR calc Af Amer 44 (L) >60 mL/min    Comment: (NOTE) The eGFR has been calculated using the CKD EPI equation. This calculation has not been validated in all clinical situations. eGFR's persistently <60 mL/min signify possible Chronic Kidney Disease.    Anion gap 6 5 - 15    ABGS No results for input(s): PHART, PO2ART, TCO2, HCO3 in the last 72 hours.  Invalid input(s): PCO2 CULTURES Recent Results (from the past 240 hour(s))  MRSA PCR Screening     Status: Abnormal   Collection Time: 07/06/17  5:15 AM  Result Value Ref Range Status   MRSA by PCR POSITIVE (A) NEGATIVE Final    Comment:  The GeneXpert MRSA Assay (FDA approved for NASAL specimens only), is one component of a comprehensive MRSA colonization surveillance program. It is not intended to diagnose MRSA infection nor to guide or monitor treatment for MRSA infections. RESULT CALLED TO, READ BACK BY AND VERIFIED WITH: SPANGER,E AT 0919 ON 07/06/2017 BY MOSLEY,J    Studies/Results: No results found.  Medications:  Prior to Admission:  Prescriptions Prior to Admission  Medication Sig Dispense Refill Last Dose  . ALPRAZolam (XANAX) 1 MG tablet Take 1 mg by mouth 3 (three) times daily as needed.    06/24/2017 at Unknown time  . budesonide-formoterol (SYMBICORT) 160-4.5 MCG/ACT inhaler Inhale 2 puffs into the lungs 2 (two) times daily.    06/24/2017 at Unknown time  . oxyCODONE (ROXICODONE) 15 MG immediate release tablet Take 1 tablet by mouth 4 (four) times daily.   06/24/2017 at Unknown time  . tiotropium (SPIRIVA) 18 MCG  inhalation capsule Place 18 mcg into inhaler and inhale at bedtime.    06/24/2017 at Unknown time  . acetaminophen (TYLENOL) 500 MG tablet Take 250-500 mg by mouth every 6 (six) hours as needed for mild pain, moderate pain, fever or headache.    Past Week at Unknown time  . albuterol (PROVENTIL) (2.5 MG/3ML) 0.083% nebulizer solution Take 2.5 mg by nebulization every 6 (six) hours as needed for wheezing or shortness of breath.    Past Week at Unknown time  . aspirin EC 81 MG tablet Take 81 mg by mouth daily.   06/24/2017 at Unknown time  . citalopram (CELEXA) 20 MG tablet Take 20 mg by mouth at bedtime.    06/24/2017 at Unknown time  . gabapentin (NEURONTIN) 100 MG capsule Take 100 mg by mouth 5 (five) times daily.    06/24/2017 at Unknown time  . isosorbide mononitrate (IMDUR) 30 MG 24 hr tablet Take 1.5 tablets (45 mg total) by mouth daily. 30 tablet 12   . nitroGLYCERIN (NITROSTAT) 0.4 MG SL tablet Place 0.4 mg under the tongue every 5 (five) minutes as needed for chest pain.   Past Month at Unknown time  . predniSONE (STERAPRED UNI-PAK 21 TAB) 10 MG (21) TBPK tablet Take by package instructions 21 tablet 0   . torsemide (DEMADEX) 20 MG tablet Take 1 tablet (20 mg total) by mouth daily. 60 tablet 12   . traMADol (ULTRAM) 50 MG tablet Take 1 tablet (50 mg total) by mouth every 6 (six) hours as needed. (Patient taking differently: Take 50 mg by mouth every 6 (six) hours as needed for moderate pain. ) 20 tablet 0 06/24/2017 at Unknown time   Scheduled: . citalopram  20 mg Oral QHS  . gabapentin  100 mg Oral 5 X Daily  . isosorbide mononitrate  45 mg Oral Daily  . metoprolol tartrate  12.5 mg Oral BID  . oxyCODONE  15 mg Oral QID  . sodium chloride flush  3 mL Intravenous Q12H  . tiotropium  18 mcg Inhalation QHS   Continuous: . sodium chloride Stopped (07/06/17 0532)  . niCARDipine Stopped (07/06/17 0857)   LNL:GXQJJH chloride, albuterol, ALPRAZolam, fentaNYL (SUBLIMAZE) injection, metoprolol  tartrate, ondansetron **OR** ondansetron (ZOFRAN) IV, sodium chloride flush  Assesment: She was admitted with leaking/expanding abdominal aortic aneurysm. She has had previous evaluations and not felt to be a surgical candidate because of multiple other medical problems. She has DO NOT RESUSCITATE status. She has stage III chronic kidney disease. I discussed her situation with Dr. Thornton Papas from radiology and after review of  CT with him I don't think she needs another imaging study because I don't think it will help [place]. Principal Problem:   Ruptured aneurysm of artery (HCC) Active Problems:   Anxiety disorder   Abdominal aortic aneurysm (HCC)   Chronic respiratory failure with hypoxia (HCC)   COPD (chronic obstructive pulmonary disease) (HCC)   DNR (do not resuscitate)   CKD (chronic kidney disease) stage 3, GFR 30-59 ml/min    Plan: Transfer out of stepdown. Request palliative care consultation    LOS: 2 days   Shanetta Nicolls L 07/08/2017, 8:22 AM

## 2017-07-08 NOTE — Consult Note (Signed)
Consultation Note Date: 07/08/2017   Patient Name: Melanie Lowe  DOB: 1924-09-12  MRN: [id number]  Age / Sex: 83 y.o., female  PCP: Kari Baars, MD Referring Physician: Kari Baars, MD  Reason for Consultation: Establishing goals of care and Psychosocial/spiritual support  HPI/Patient Profile: 83 y.o. female  with past medical history of AAA, anxiety and depression, arthritis, CHF diagnosed 11/2015, COPD oxygen dependent (multiple ED visits), history of stroke, history of MI admitted on 07/06/2017 with ruptured aneurysm of artery.   Clinical Assessment and Goals of Care: Mrs. Budzik is resting quietly in bed. She does not make eye contact when I call her name. She is confused at this time, stating she is talking to her husband on the phone. There is no family at bedside at this time.  Call to son Alexxus Sobh. He states that Mrs. Goulart lives in his home with he and his fiance [employer]. [name] states that Mrs. Perriello's husband has been deceased for 10 years. [name] states that the family has decided several years ago that Mrs. Wherley is not a candidate for surgery. We talk about her functional status. He states that she takes naps throughout the day, sometimes staying up late at night, sleeping late in the morning. He states she enjoys sitting watching TV, and spending time with her dog. He shares that [name] helps her with bathing, she has Meals on Wheels for one meal a day 5 days a week, which she repeats in the microwave. He states that family prepares food for her, but she does not come to the common table, instead eating in her room in front of the television.  [name] states that Mrs. Schwarzkopf see her trusted PCP every one to 2 months. He states that she is been to the ED 9 times in the last 6 months because of COPD problems, he states that she has trouble breathing and they are unsure what to do.  We  talk about what's next, [name] states that they can [facility]. We talk about the benefits of hospice in detail including RN visits, CNA, chaplain, and Child psychotherapist. [name] states that his mother has "turned down hospice before". He states that her worry is having someone in her home, and that her dog may bites. I share that the wonderful thing about hospice is, they do it her way. We talked about where Mrs. Bores wants to be one her time comes to pass, [name] states at home. I share my worry over possible sudden decline, I share that both Mrs. Ernest Mallick and [name] needs support during this time. [name] states that he will discuss hospice with his mother. I share that I will too.  Healthcare power of attorney NEXT OF KIN - Mrs. Rothgeb lives in her son [name] Feehan's home. She has a daughter who lives in [place].    SUMMARY OF RECOMMENDATIONS   treat the treatable but no CPR for intubation. We discussed the benefits of home with hospice. No  decision at this time.  Code Status/Advance Care Planning:  DNR- verified with son [name].  Symptom Management:   per Dr. Juanetta Gosling, no additional needs at this time.  Palliative Prophylaxis:   Frequent Pain Assessment and Turn Reposition  Additional Recommendations (Limitations, Scope, Preferences):  Continue to treat the treatable but no CPR or intubation., No surgery  Psycho-social/Spiritual:   Desire for further Chaplaincy support:no  Additional Recommendations: Caregiving  Support/Resources and Education on Hospice  Prognosis:   < 3 months, would not be surprising based on functional status, 9 ED visits in the last 6 months for COPD exacerbation. Enlarging/rupturing aortic aneurysm.  Discharge Planning: Son [name] states that they have the benefit [facility]. We discuss in detail the benefits of in-home hospice. No decision for hospice at this time.        Primary Diagnoses: Present on Admission: . Ruptured aneurysm of artery (HCC) . Abdominal aortic aneurysm (HCC) . Anxiety disorder . Chronic respiratory failure with hypoxia (HCC) . CKD (chronic kidney disease) stage 3, GFR 30-59 ml/min . COPD (chronic obstructive pulmonary disease) (HCC) . DNR (do not resuscitate)   I have reviewed the medical record, interviewed the patient and family, and examined the patient. The following aspects are pertinent.  Past Medical History:  Diagnosis Date  . AAA (abdominal aortic aneurysm) (HCC)   . Anxiety   . Arthritis   . CHF (congestive heart failure) (HCC) 11/09/2015  . COPD (chronic obstructive pulmonary disease) (HCC)   . Depression   . Hypertension   . Leg swelling   . Myocardial infarction (HCC)   . Neuropathy   . Oxygen dependent   . Pneumonia   . Stroke [facility])    Social History   Social History  . Marital status: Widowed    Spouse name: N/A  . Number of children: N/A  . Years of education: N/A   Social History Main Topics  . Smoking status: Former Smoker    Years: 50.00    Types: Cigarettes    Quit date: 12/31/2010  . Smokeless tobacco: Former Neurosurgeon    Quit date: 12/31/2010  . Alcohol use No  . Drug use: No  . Sexual activity: No   Other Topics Concern  . None   Social History Narrative  . None   Family History  Problem Relation Age of Onset  . Diabetes Brother   . Cancer Brother        Throat cancer, Heart attack  . Heart disease Brother   . Heart attack Brother   . Cancer Father   . Cancer Sister    Scheduled Meds: . Chlorhexidine Gluconate Cloth  6 each Topical Q0600  . citalopram  20 mg Oral QHS  . gabapentin  100 mg Oral 5 X Daily  . isosorbide mononitrate  45 mg Oral Daily  . metoprolol tartrate  12.5 mg Oral BID  . mupirocin ointment  1 application Nasal BID  . oxyCODONE  15 mg Oral QID  . sodium chloride flush  3 mL Intravenous Q12H  . tiotropium  18 mcg Inhalation QHS   Continuous  Infusions: . sodium chloride Stopped (07/06/17 0532)  . niCARDipine Stopped (07/06/17 0857)   PRN Meds:.sodium chloride, albuterol, ALPRAZolam, fentaNYL (SUBLIMAZE) injection, metoprolol tartrate, ondansetron **OR** ondansetron (ZOFRAN) IV, sodium chloride flush Medications Prior to Admission:  Prior to Admission medications   Medication Sig Start Date End Date Taking? Authorizing Provider  ALPRAZolam Prudy Feeler) 1 MG tablet Take 1 mg  by mouth 3 (three) times daily as needed.    Yes [provider]  budesonide-formoterol (SYMBICORT) 160-4.5 MCG/ACT inhaler Inhale 2 puffs into the lungs 2 (two) times daily.    Yes [provider]  oxyCODONE (ROXICODONE) 15 MG immediate release tablet Take 1 tablet by mouth 4 (four) times daily. 06/21/17  Yes [provider]  tiotropium (SPIRIVA) 18 MCG inhalation capsule Place 18 mcg into inhaler and inhale at bedtime.    Yes [provider]  acetaminophen (TYLENOL) 500 MG tablet Take 250-500 mg by mouth every 6 (six) hours as needed for mild pain, moderate pain, fever or headache.     [provider]  albuterol (PROVENTIL) (2.5 MG/3ML) 0.083% nebulizer solution Take 2.5 mg by nebulization every 6 (six) hours as needed for wheezing or shortness of breath.     [provider]  aspirin EC 81 MG tablet Take 81 mg by mouth daily.    [provider]  citalopram (CELEXA) 20 MG tablet Take 20 mg by mouth at bedtime.     [provider]  gabapentin (NEURONTIN) 100 MG capsule Take 100 mg by mouth 5 (five) times daily.     [provider]  isosorbide mononitrate (IMDUR) 30 MG 24 hr tablet Take 1.5 tablets (45 mg total) by mouth daily. 06/27/17   Kari Baars, MD  nitroGLYCERIN (NITROSTAT) 0.4 MG SL tablet Place 0.4 mg under the tongue every 5 (five) minutes as needed for chest pain. 08/17/16   [provider]  predniSONE (STERAPRED UNI-PAK 21 TAB) 10 MG (21) TBPK tablet Take by package  instructions 06/27/17   Kari Baars, MD  torsemide (DEMADEX) 20 MG tablet Take 1 tablet (20 mg total) by mouth daily. 06/27/17   Kari Baars, MD  traMADol (ULTRAM) 50 MG tablet Take 1 tablet (50 mg total) by mouth every 6 (six) hours as needed. Patient taking differently: Take 50 mg by mouth every 6 (six) hours as needed for moderate pain.  02/10/17   [name], MD   Allergies  Allergen Reactions  . Ciprofloxacin Hcl Nausea And Vomiting  . Nsaids Other (See Comments)    Advised by MD not to take this class of medication.  Pt is able to take coated Aspirin.    . Other Other (See Comments)    Spices trigger acid reflux.   . Sulfa Antibiotics Nausea And Vomiting  . Sulfur Nausea And Vomiting  . Tomato Other (See Comments)    Reaction:  Acid reflux   Review of Systems  Unable to perform ROS: Age    Physical Exam  Constitutional: No distress.  Frail and thin, no eye contact, confused  HENT:  Head: Atraumatic.  Cardiovascular: Regular rhythm.   Rate 50's  Pulmonary/Chest: Effort normal. No respiratory distress.  Abdominal: Soft. She exhibits no distension.  Musculoskeletal: She exhibits no edema.  Bird chest  Neurological:  Sleepy, confused  Skin: Skin is warm and dry.  Nursing note and vitals reviewed.   Vital Signs: BP (!) 86/59   Pulse (!) 51   Temp 98.6 F (37 C) (Oral)   Resp 13   Ht 5\' 1"  (1.549 m)   Wt 61.1 kg (134 lb 11.2 oz)   SpO2 (!) 78%   BMI 25.45 kg/m  Pain Assessment: 0-10   Pain Score: Asleep   SpO2: SpO2: (!) 78 % O2 Device:SpO2: (!) 78 % O2 Flow Rate: .O2 Flow Rate (L/min): 3 L/min  IO: Intake/output summary:  Intake/Output Summary (Last 24  hours) at 07/08/17 1301 Last data filed at 07/07/17 2300  Gross per 24 hour  Intake              480 ml  Output              500 ml  Net              -20 ml    LBM: Last BM Date: 07/08/17 Baseline Weight: Weight: 57.9 kg (127 lb 10.3 oz) Most recent weight: Weight: 61.1 kg (134 lb 11.2 oz)      Palliative Assessment/Data:   Flowsheet Rows     Most Recent Value  Intake Tab  Referral Department  -- [PCP]  Unit at Time of Referral  Cardiac/Telemetry Unit  Palliative Care Primary Diagnosis  Other (Comment) [AAA]  Date Notified  07/08/17  Palliative Care Type  New Palliative care  Date of Admission  07/06/17  Date first seen by Palliative Care  07/08/17  # of days Palliative referral response time  0 Day(s)  # of days IP prior to Palliative referral  2  Clinical Assessment  Palliative Performance Scale Score  20%  Pain Max last 24 hours  Not able to report  Pain Min Last 24 hours  Not able to report  Dyspnea Max Last 24 Hours  Not able to report  Dyspnea Min Last 24 hours  Not able to report  Psychosocial & Spiritual Assessment  Palliative Care Outcomes  Patient/Family meeting held?  Yes  Who was at the meeting?  patient at bedside, son Ariba Lehnen via phone.   Patient/Family wishes: Interventions discontinued/not started   Mechanical Ventilation      Time In: 1105 Time Out: 1215 Time Total: 70 minutes Greater than 50%  of this time was spent counseling and coordinating care related to the above assessment and plan.  Signed by: Katheran Awe, NP   Please contact Palliative Medicine Team phone at [phone] for questions and concerns.  For individual provider: See Loretha Stapler

## 2017-07-09 NOTE — Care Management Important Message (Signed)
Important Message  Patient Details  Name: Melanie Lowe MRN: [id number] Date of Birth: 08-May-1924   Medicare Important Message Given:  Yes    Andriea Hasegawa, Chrystine Oiler, RN 07/09/2017, 9:49 AM

## 2017-07-09 NOTE — Care Management (Signed)
Verified address for EMS transport with [name]-fiance of pt's son. They are at home and awaiting arrival of patient. Still declines any services such as Hospice and Home Health. EMS transport arranged. Family would like a call when patient is leaving hospital. CM will notify RN.

## 2017-07-09 NOTE — Progress Notes (Signed)
Patient discharged home and transported via EMS this afternoon. Nursing staff called patient's son Katalin Craddock to notify him she was en route, stated she had just arrived. Discharge packet with AVS and signed DNR sheet sent with patient. Earnstine Regal, RN

## 2017-07-09 NOTE — Progress Notes (Signed)
Patient to discharge home today. Pt aware of discharge plan, requires reinforcement of education. Spoke with patient's son, Temeca Rahman. He states they are aware of discharge plan, requested EMS transport home. States he will be at the home today awaiting her arrival. He is aware EMS transport time depends on when they are available. Discussed option of home health/home hospice assistance per CM request. He stated "we will discuss that with her because she has refused it so far." stated he will contact PCP with any questions, concerns or changes regarding wanting home hospice assistance. CM to notify EMS of need for transport. Copy of AVS to be sent with patient at discharge. Earnstine Regal, RN

## 2017-07-09 NOTE — Progress Notes (Signed)
Subjective: She says she feels okay. She is sleepy. Agree hospice care would be appropriate but this morning she says she doesn't want that. I don't think we're doing anything now in the hospital but cannot be done at home  Objective: Vital signs in last 24 hours: Temp:  [98.6 F (37 C)] 98.6 F (37 C) (07/09 1100) Pulse Rate:  [51-58] 57 (07/09 1600) Resp:  [11-16] 12 (07/09 1600) BP: (86-108)/(59-71) 108/71 (07/09 1600) SpO2:  [78 %-100 %] 98 % (07/09 1958) Weight change:  Last BM Date: 07/08/17  Intake/Output from previous day: No intake/output data recorded.  PHYSICAL EXAM General appearance: Sleepy. Mildly confused Resp: clear to auscultation bilaterally Cardio: regular rate and rhythm, S1, S2 normal, no murmur, click, rub or gallop GI: soft, non-tender; bowel sounds normal; no masses,  no organomegaly Extremities: extremities normal, atraumatic, no cyanosis or edema Skin warm and dry  Lab Results:  Results for orders placed or performed during the hospital encounter of 07/06/17 (from the past 48 hour(s))  CBC with Differential/Platelet     Status: Abnormal   Collection Time: 07/08/17  5:14 AM  Result Value Ref Range   WBC 11.3 (H) 4.0 - 10.5 K/uL   RBC 2.60 (L) 3.87 - 5.11 MIL/uL   Hemoglobin 8.2 (L) 12.0 - 15.0 g/dL   HCT 24.8 (L) 36.0 - 46.0 %   MCV 95.4 78.0 - 100.0 fL   MCH 31.5 26.0 - 34.0 pg   MCHC 33.1 30.0 - 36.0 g/dL   RDW 13.2 11.5 - 15.5 %   Platelets 173 150 - 400 K/uL   Neutrophils Relative % 76 %   Neutro Abs 8.6 (H) 1.7 - 7.7 K/uL   Lymphocytes Relative 12 %   Lymphs Abs 1.3 0.7 - 4.0 K/uL   Monocytes Relative 12 %   Monocytes Absolute 1.3 (H) 0.1 - 1.0 K/uL   Eosinophils Relative 0 %   Eosinophils Absolute 0.1 0.0 - 0.7 K/uL   Basophils Relative 0 %   Basophils Absolute 0.0 0.0 - 0.1 K/uL  Basic metabolic panel     Status: Abnormal   Collection Time: 07/08/17  5:14 AM  Result Value Ref Range   Sodium 135 135 - 145 mmol/L   Potassium 4.1 3.5  - 5.1 mmol/L   Chloride 102 101 - 111 mmol/L   CO2 27 22 - 32 mmol/L   Glucose, Bld 111 (H) 65 - 99 mg/dL   BUN 27 (H) 6 - 20 mg/dL   Creatinine, Ser 1.20 (H) 0.44 - 1.00 mg/dL   Calcium 8.6 (L) 8.9 - 10.3 mg/dL   GFR calc non Af Amer 38 (L) >60 mL/min   GFR calc Af Amer 44 (L) >60 mL/min    Comment: (NOTE) The eGFR has been calculated using the CKD EPI equation. This calculation has not been validated in all clinical situations. eGFR's persistently <60 mL/min signify possible Chronic Kidney Disease.    Anion gap 6 5 - 15    ABGS No results for input(s): PHART, PO2ART, TCO2, HCO3 in the last 72 hours.  Invalid input(s): PCO2 CULTURES Recent Results (from the past 240 hour(s))  MRSA PCR Screening     Status: Abnormal   Collection Time: 07/06/17  5:15 AM  Result Value Ref Range Status   MRSA by PCR POSITIVE (A) NEGATIVE Final    Comment:        The GeneXpert MRSA Assay (FDA approved for NASAL specimens only), is one component of a comprehensive MRSA colonization  surveillance program. It is not intended to diagnose MRSA infection nor to guide or monitor treatment for MRSA infections. RESULT CALLED TO, READ BACK BY AND VERIFIED WITH: SPANGER,E AT 0919 ON 07/06/2017 BY MOSLEY,J    Studies/Results: No results found.  Medications:  Prior to Admission:  Prescriptions Prior to Admission  Medication Sig Dispense Refill Last Dose  . ALPRAZolam (XANAX) 1 MG tablet Take 1 mg by mouth 3 (three) times daily as needed.    06/24/2017 at Unknown time  . budesonide-formoterol (SYMBICORT) 160-4.5 MCG/ACT inhaler Inhale 2 puffs into the lungs 2 (two) times daily.    06/24/2017 at Unknown time  . oxyCODONE (ROXICODONE) 15 MG immediate release tablet Take 1 tablet by mouth 4 (four) times daily.   06/24/2017 at Unknown time  . tiotropium (SPIRIVA) 18 MCG inhalation capsule Place 18 mcg into inhaler and inhale at bedtime.    06/24/2017 at Unknown time  . acetaminophen (TYLENOL) 500 MG tablet  Take 250-500 mg by mouth every 6 (six) hours as needed for mild pain, moderate pain, fever or headache.    Past Week at Unknown time  . albuterol (PROVENTIL) (2.5 MG/3ML) 0.083% nebulizer solution Take 2.5 mg by nebulization every 6 (six) hours as needed for wheezing or shortness of breath.    Past Week at Unknown time  . aspirin EC 81 MG tablet Take 81 mg by mouth daily.   06/24/2017 at Unknown time  . citalopram (CELEXA) 20 MG tablet Take 20 mg by mouth at bedtime.    06/24/2017 at Unknown time  . gabapentin (NEURONTIN) 100 MG capsule Take 100 mg by mouth 5 (five) times daily.    06/24/2017 at Unknown time  . isosorbide mononitrate (IMDUR) 30 MG 24 hr tablet Take 1.5 tablets (45 mg total) by mouth daily. 30 tablet 12   . nitroGLYCERIN (NITROSTAT) 0.4 MG SL tablet Place 0.4 mg under the tongue every 5 (five) minutes as needed for chest pain.   Past Month at Unknown time  . predniSONE (STERAPRED UNI-PAK 21 TAB) 10 MG (21) TBPK tablet Take by package instructions 21 tablet 0   . torsemide (DEMADEX) 20 MG tablet Take 1 tablet (20 mg total) by mouth daily. 60 tablet 12   . traMADol (ULTRAM) 50 MG tablet Take 1 tablet (50 mg total) by mouth every 6 (six) hours as needed. (Patient taking differently: Take 50 mg by mouth every 6 (six) hours as needed for moderate pain. ) 20 tablet 0 06/24/2017 at Unknown time   Scheduled: . Chlorhexidine Gluconate Cloth  6 each Topical Q0600  . citalopram  20 mg Oral QHS  . gabapentin  100 mg Oral 5 X Daily  . isosorbide mononitrate  45 mg Oral Daily  . metoprolol tartrate  12.5 mg Oral BID  . mupirocin ointment  1 application Nasal BID  . oxyCODONE  15 mg Oral QID  . sodium chloride flush  3 mL Intravenous Q12H  . tiotropium  18 mcg Inhalation QHS   Continuous: . sodium chloride Stopped (07/06/17 0532)  . niCARDipine Stopped (07/06/17 0857)   GHW:EXHBZJ chloride, albuterol, ALPRAZolam, fentaNYL (SUBLIMAZE) injection, metoprolol tartrate, ondansetron **OR**  ondansetron (ZOFRAN) IV, sodium chloride flush  Assesment: She came in with a ruptured aortic aneurysm. She did not have a contrasted study because of her chronic kidney disease but it looks like there may be some thrombosis in the aneurysm as well. She is not a surgical candidate and this was determined several years ago. At baseline she has  severe COPD with chronic hypoxic respiratory failure anxiety and chronic back pain and some element of coronary disease. She has been in and out of the hospital on multiple occasions recently. I attempted to call her son but his cell phone seems to be out of service. When I called the house I spoke to his fiance [name] who from previous experience no provides a lot of care for Mrs. Deroy. I told her I think Mrs. Safer can go home and that we need to shift our focus entirely toward comfort care. They do not want hospice or home health services at this time Principal Problem:   Ruptured aneurysm of artery [facility]) Active Problems:   Anxiety disorder   Abdominal aortic aneurysm (HCC)   Chronic respiratory failure with hypoxia (HCC)   COPD (chronic obstructive pulmonary disease) (HCC)   DNR (do not resuscitate)   CKD (chronic kidney disease) stage 3, GFR 30-59 ml/min   Goals of care, counseling/discussion   Encounter for hospice care discussion    Plan: Discharge home today I think her lifespan is now measured in weeks    LOS: 3 days   Claira Jeter L 07/09/2017, 8:06 AM

## 2017-07-09 NOTE — Discharge Summary (Signed)
Physician Discharge Summary  Patient ID: Melanie Lowe MRN: [id number] DOB/AGE: February 03, 1924 83 y.o. Primary Care Physician:Enma Maeda, Ramon Dredge, MD Admit date: 07/06/2017 Discharge date: 07/09/2017    Discharge Diagnoses:   Principal Problem:   Ruptured aneurysm of artery [facility]) Active Problems:   Anxiety disorder   Abdominal aortic aneurysm (HCC)   Chronic respiratory failure with hypoxia (HCC)   COPD (chronic obstructive pulmonary disease) (HCC)   DNR (do not resuscitate)   CKD (chronic kidney disease) stage 3, GFR 30-59 ml/min   Goals of care, counseling/discussion   Encounter for hospice care discussion Coronary artery occlusive disease  Allergies as of 07/09/2017      Reactions   Ciprofloxacin Hcl Nausea And Vomiting   Nsaids Other (See Comments)   Advised by MD not to take this class of medication.  Pt is able to take coated Aspirin.     Other Other (See Comments)   Spices trigger acid reflux.    Sulfa Antibiotics Nausea And Vomiting   Sulfur Nausea And Vomiting   Tomato Other (See Comments)   Reaction:  Acid reflux      Medication List    TAKE these medications   acetaminophen 500 MG tablet Commonly known as:  TYLENOL Take 250-500 mg by mouth every 6 (six) hours as needed for mild pain, moderate pain, fever or headache.   albuterol (2.5 MG/3ML) 0.083% nebulizer solution Commonly known as:  PROVENTIL Take 2.5 mg by nebulization every 6 (six) hours as needed for wheezing or shortness of breath.   ALPRAZolam 1 MG tablet Commonly known as:  XANAX Take 1 mg by mouth 3 (three) times daily as needed.   aspirin EC 81 MG tablet Take 81 mg by mouth daily.   budesonide-formoterol 160-4.5 MCG/ACT inhaler Commonly known as:  SYMBICORT Inhale 2 puffs into the lungs 2 (two) times daily.   citalopram 20 MG tablet Commonly known as:  CELEXA Take 20 mg by mouth at bedtime.   gabapentin 100 MG capsule Commonly known as:  NEURONTIN Take 100 mg by mouth 5 (five) times daily.    isosorbide mononitrate 30 MG 24 hr tablet Commonly known as:  IMDUR Take 1.5 tablets (45 mg total) by mouth daily.   nitroGLYCERIN 0.4 MG SL tablet Commonly known as:  NITROSTAT Place 0.4 mg under the tongue every 5 (five) minutes as needed for chest pain.   oxyCODONE 15 MG immediate release tablet Commonly known as:  ROXICODONE Take 1 tablet by mouth 4 (four) times daily.   predniSONE 10 MG (21) Tbpk tablet Commonly known as:  STERAPRED UNI-PAK 21 TAB Take by package instructions   tiotropium 18 MCG inhalation capsule Commonly known as:  SPIRIVA Place 18 mcg into inhaler and inhale at bedtime.   torsemide 20 MG tablet Commonly known as:  DEMADEX Take 1 tablet (20 mg total) by mouth daily.   traMADol 50 MG tablet Commonly known as:  ULTRAM Take 1 tablet (50 mg total) by mouth every 6 (six) hours as needed. What changed:  reasons to take this       Discharged Condition:Improved    Consults: Palliative care/telephone with vascular surgery at [facility]  Significant Diagnostic Studies: Dg Chest 2 View  Result Date: 06/30/2017 CLINICAL DATA:  Shortness of breath and upper back pain between the shoulder blades EXAM: CHEST  2 VIEW COMPARISON:  06/25/2017, 06/24/2017 FINDINGS: Linear atelectasis or scar at both bases. Elevation of the left diaphragm. No pleural effusion. Hyperinflation. Stable enlarged cardiomediastinal  silhouette with atherosclerosis. Tortuous ectatic aorta with prior CT demonstrating aneurysmal dilatation. Patient rotation exaggerates the mediastinal contour. No pneumothorax. IMPRESSION: 1. Linear atelectasis or scarring at the bases 2. Stable cardiomegaly and aortic tortuosity. Electronically Signed   By: Jasmine Pang M.D.   On: 06/30/2017 00:19   Dg Chest 2 View  Result Date: 06/24/2017 CLINICAL DATA:  Left-sided chest pain and dyspnea, onset tonight. Several days history of productive cough. EXAM: CHEST  2 VIEW COMPARISON:  02/10/2017,  05/10/17. FINDINGS: Marked aortic tortuosity, unchanged. Mild cardiomegaly, unchanged. Stable chronic scarring bilaterally. No airspace consolidation. No effusion. Normal pulmonary vasculature. Chronic arthropathic changes of both shoulders. IMPRESSION: Stable cardiomegaly and aortic tortuosity. No consolidation or effusion. Electronically Signed   By: Ellery Plunk M.D.   On: 06/24/2017 23:26   Ct Angio Chest Pe W And/or Wo Contrast  Result Date: 06/25/2017 CLINICAL DATA:  83 y/o F; shortness of breath and elevated D-dimer. History of COPD. EXAM: CT ANGIOGRAPHY CHEST WITH CONTRAST TECHNIQUE: Multidetector CT imaging of the chest was performed using the standard protocol during bolus administration of intravenous contrast. Multiplanar CT image reconstructions and MIPs were obtained to evaluate the vascular anatomy. CONTRAST:  64 cc Isovue 370 COMPARISON:  05/10/2017 CTA of the chest. FINDINGS: Cardiovascular: Stable 4.6 cm ascending thoracic aortic aneurysm. 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 Moderate diffuse calcific atherosclerosis. Normal heart size. No pericardial effusion. Moderate coronary artery calcifications. Satisfactory opacification of the pulmonary arteries. Mild respiratory motion artifact predominantly in the lung bases. No pulmonary embolus is identified. Mediastinum/Nodes: Mild heterogeneity with coarse calcifications in left lobe of thyroid. Lungs/Pleura: Moderate to severe centrilobular emphysema. Diffuse peribronchial thickening and scattered mucous plugging in the lung bases. Mild predominantly basilar cylindrical bronchiectasis. Platelike atelectasis within the left upper lobe lingula. Upper Abdomen: No acute abnormality. Musculoskeletal: No chest wall abnormality. No acute or significant osseous findings. Review of the MIP images confirms the above findings. IMPRESSION: 1. No pulmonary embolus identified. 2. Stable 4.6  cm ascending thoracic aortic aneurysm. Ascending thoracic aortic aneurysm. Recommend semi-annual imaging followup by CTA or MRA and referral to cardiothoracic surgery if not already obtained. This recommendation follows 2010 ACCF/AHA/AATS/ACR/ASA/SCA/SCAI/SIR/STS/SVM Guidelines for the Diagnosis and Management of Patients With Thoracic Aortic Disease. Circulation. 2010; 121: I203-T597 3. Stable penetrating ulcer arising from the left lateral wall of the aortic arch. 4. Stable moderate to severe centrilobular emphysema of the lungs. 5. Diffuse peribronchial thickening and scattered mucous plugging in the lung bases is compatible with acute bronchitis. 6. No consolidation to suggest pneumonia. Electronically Signed   By: Mitzi Hansen M.D.   On: 06/25/2017 03:13   Ct Renal Stone Study  Result Date: 07/06/2017 CLINICAL DATA:  Acute onset of left flank pain radiating into left lower quadrant. EXAM: CT ABDOMEN AND PELVIS WITHOUT CONTRAST TECHNIQUE: Multidetector CT imaging of the abdomen and pelvis was performed following the standard protocol without IV contrast. COMPARISON:  Aortic ultrasound 09/03/2016, abdominal CT 12/12/2015 FINDINGS: Lower chest: Ground-glass opacities in the lower lobes appear chronic and similar to prior exam. No pleural fluid. There is cardiomegaly. Coronary artery calcifications. Hepatobiliary: No evidence of focal lesion allowing for lack contrast. Gallbladder surgically absent. Biliary prominence is unchanged from prior CT. Pancreas: Parenchymal atrophy. No ductal dilatation or inflammation. Spleen: Normal in size without focal abnormality. Adrenals/Urinary Tract: Left adrenal thickening is stable. Right adrenal gland is normal. There is no hydronephrosis. Left kidney displaced laterally by abdominal aortic aneurysm.  Urinary bladder is physiologically distended. Stomach/Bowel: Moderate colonic diverticulosis distally, no acute diverticulitis. Small to moderate stool burden. No  bowel obstruction, wall thickening or evidence of inflammation. Appendix surgically absent. Vascular/Lymphatic: Large saccular infrarenal abdominal aortic aneurysm measuring 7.9 x 8.4 cm, with indistinctness about the wall laterally and mild adjacent stranding. Findings concerning for early inflammation/rupture. Mural thrombus within the aneurysm sac. Aneurysm sac originates at the level of the renal arteries when compared with prior contrast-enhanced CT. Aneurysms and distally returns T relatively normal aortic caliber with more distal aneurysmal dilatation of 3.2 cm. Right common iliac artery measures 16 mm, left common iliac artery measures 15 mm. There are diffuse atheromatous calcifications. Reproductive: Atrophic uterus, and difficult to differentiate from adjacent bowel loops. No adnexal mass. Other: No free fluid or ascites.  No free air. Musculoskeletal: Scoliosis and degenerative change in the spine. There are no acute or suspicious osseous abnormalities. IMPRESSION: 1. Increased size of large saccular aortic aneurysm, currently measuring 8.4 x 7.9 cm (previously 8.1 x 7.7 cm on ultrasound and 7.5 x 7.4 cm on CT). Indistinct aneurysm wall with faint surrounding stranding suspicious for early inflammation/rupture. 2. Diffuse aortic and branch atherosclerosis. 3. Colonic diverticulosis without acute inflammation. Critical Value/emergent results were called by telephone at the time of interpretation on 07/06/2017 at 2:50 am to Dr. Devoria Albe , who verbally acknowledged these results. Electronically Signed   By: Rubye Oaks M.D.   On: 07/06/2017 03:01    Lab Results: Basic Metabolic Panel:  Recent Labs  16/10/96 0514  NA 135  K 4.1  CL 102  CO2 27  GLUCOSE 111*  BUN 27*  CREATININE 1.20*  CALCIUM 8.6*   Liver Function Tests: No results for input(s): AST, ALT, ALKPHOS, BILITOT, PROT, ALBUMIN in the last 72 hours.   CBC:  Recent Labs  07/08/17 0514  WBC 11.3*  NEUTROABS 8.6*  HGB  8.2*  HCT 24.8*  MCV 95.4  PLT 173    Recent Results (from the past 240 hour(s))  MRSA PCR Screening     Status: Abnormal   Collection Time: 07/06/17  5:15 AM  Result Value Ref Range Status   MRSA by PCR POSITIVE (A) NEGATIVE Final    Comment:        The GeneXpert MRSA Assay (FDA approved for NASAL specimens only), is one component of a comprehensive MRSA colonization surveillance program. It is not intended to diagnose MRSA infection nor to guide or monitor treatment for MRSA infections. RESULT CALLED TO, READ BACK BY AND VERIFIED WITH: SPANGER,E AT 0919 ON 07/06/2017 BY [facility] Course: This is a 83 year old who is known to have a very large abdominal aortic aneurysm. She has been felt not to be a surgical candidate because of her multiple other medical problems including severe COPD coronary disease chronic hypoxic respiratory failure, failure to thrive, malnutrition. She also has chronic back pain. She came to the emergency department because of abdominal and back pain. She underwent CT that was done with kidney stone protocol and this showed that her aneurysm has expanded and appears to be leaking. It has not ruptured. Her blood pressure was very high and she was treated with intravenous Cardene and her blood pressure came down. She then had hypotension and had received IV fluids for resuscitation. By the time of discharge she was back at baseline. She is sleepy most of the time sometimes confused. She had consultation with palliative care and there was discussion of hospice.  She has refused hospice in the past and when I talked to her on the morning of discharge although she is somewhat confused she refuses again. Discussion with family shows that they do not think they need hospice or home health at this time. She is better not having any abdominal pain no evidence of any further expansion of the aneurysm but I think her lifespan is now measured in weeks. She did have  discussion by telephone from the emergency department with vascular surgery at [facility] and at [facility] and they both felt that she was not a surgical candidate  Discharge Exam: Blood pressure 108/71, pulse (!) 57, temperature 98.6 F (37 C), temperature source Oral, resp. rate 12, height 5\' 1"  (1.549 m), weight 61.1 kg (134 lb 11.2 oz), SpO2 98 %. She sleepy but arousable. Her chest is clear. Her heart is regular.  Disposition: Home intent is for comfort care  Discharge Instructions    Diet - low sodium heart healthy    Complete by:  As directed    Increase activity slowly    Complete by:  As directed         Signed: Makenzie Vittorio L   07/09/2017, 8:10 AM

## 2017-07-10 ENCOUNTER — Emergency Department (HOSPITAL_COMMUNITY)
Admission: EM | Admit: 2017-07-10 | Discharge: 2017-07-10 | Disposition: A | Discharge status: Home / Self Care | Payer: Medicare HMO | Attending: Emergency Medicine | Admitting: Emergency Medicine

## 2017-07-10 ENCOUNTER — Encounter (HOSPITAL_COMMUNITY): Payer: Self-pay | Admitting: Emergency Medicine

## 2017-07-10 DIAGNOSIS — Z87891 Personal history of nicotine dependence: Secondary | ICD-10-CM | POA: Insufficient documentation

## 2017-07-10 DIAGNOSIS — N183 Chronic kidney disease, stage 3 (moderate): Secondary | ICD-10-CM | POA: Insufficient documentation

## 2017-07-10 DIAGNOSIS — R404 Transient alteration of awareness: Secondary | ICD-10-CM | POA: Diagnosis not present

## 2017-07-10 DIAGNOSIS — J449 Chronic obstructive pulmonary disease, unspecified: Secondary | ICD-10-CM | POA: Insufficient documentation

## 2017-07-10 DIAGNOSIS — Y999 Unspecified external cause status: Secondary | ICD-10-CM | POA: Insufficient documentation

## 2017-07-10 DIAGNOSIS — Z7982 Long term (current) use of aspirin: Secondary | ICD-10-CM | POA: Insufficient documentation

## 2017-07-10 DIAGNOSIS — T1490XA Injury, unspecified, initial encounter: Secondary | ICD-10-CM | POA: Diagnosis not present

## 2017-07-10 DIAGNOSIS — Y939 Activity, unspecified: Secondary | ICD-10-CM | POA: Diagnosis not present

## 2017-07-10 DIAGNOSIS — I5032 Chronic diastolic (congestive) heart failure: Secondary | ICD-10-CM | POA: Insufficient documentation

## 2017-07-10 DIAGNOSIS — I130 Hypertensive heart and chronic kidney disease with heart failure and stage 1 through stage 4 chronic kidney disease, or unspecified chronic kidney disease: Secondary | ICD-10-CM | POA: Diagnosis not present

## 2017-07-10 DIAGNOSIS — I959 Hypotension, unspecified: Secondary | ICD-10-CM

## 2017-07-10 DIAGNOSIS — Y929 Unspecified place or not applicable: Secondary | ICD-10-CM | POA: Diagnosis not present

## 2017-07-10 DIAGNOSIS — Z7401 Bed confinement status: Secondary | ICD-10-CM | POA: Diagnosis not present

## 2017-07-10 DIAGNOSIS — R279 Unspecified lack of coordination: Secondary | ICD-10-CM | POA: Diagnosis not present

## 2017-07-10 DIAGNOSIS — Z79899 Other long term (current) drug therapy: Secondary | ICD-10-CM | POA: Insufficient documentation

## 2017-07-10 DIAGNOSIS — I714 Abdominal aortic aneurysm, without rupture, unspecified: Secondary | ICD-10-CM

## 2017-07-10 DIAGNOSIS — R4182 Altered mental status, unspecified: Secondary | ICD-10-CM | POA: Diagnosis present

## 2017-07-10 DIAGNOSIS — R55 Syncope and collapse: Secondary | ICD-10-CM | POA: Diagnosis not present

## 2017-07-10 DIAGNOSIS — W19XXXA Unspecified fall, initial encounter: Secondary | ICD-10-CM

## 2017-07-10 MED ORDER — MORPHINE SULFATE ER 15 MG PO TBCR: 15.0000 mg | EXTENDED_RELEASE_TABLET | Freq: Two times a day (BID) | ORAL | 0 refills | Status: DC

## 2017-07-10 NOTE — ED Notes (Signed)
Dr. Juleen China aware of o2 sat.

## 2017-07-10 NOTE — Discharge Instructions (Signed)
You are being prescribed ms contin. This will give a slower continuous release of pain medication and you can continue to take your other pain medication as needed. This may make you more drowsy and place you at higher risk for falls. This is to try and make you more comfortable because unfortunately we cannot fix your large aneurysm. Please follow up with Dr Juanetta Gosling if you require additional medications beyond what you are being prescribed today.

## 2017-07-10 NOTE — ED Triage Notes (Signed)
Per EMS lives w family- recent fall after DC from [facility] yesterday Per EMS Pt became unresponsive with decreased BP and mentation enroute to [facility]

## 2017-07-10 NOTE — ED Notes (Signed)
Dr Kohut in to assess 

## 2017-07-10 NOTE — ED Notes (Signed)
Called ems to transport pt home.  Pt's son and daughter in law are aware pt is coming home and they will be there.

## 2017-07-10 NOTE — ED Provider Notes (Signed)
[facility]-EMERGENCY DEPT Provider Note   CSN: [id number] Arrival date & time: 07/10/17  1254     History   Chief Complaint Chief Complaint  Patient presents with  . Altered Mental Status  . Fall    HPI Melanie Lowe is a 83 y.o. female.  HPI   [name] coming after fall. Known leaking AAA that is non-operative. Pt unable to provide history. Son was called. He says he came back from running errands and found her laying next to bed. He thinks she may have rolled out. She may have been there for up to 2 hours.   Past Medical History:  Diagnosis Date  . AAA (abdominal aortic aneurysm) (HCC)   . Anxiety   . Arthritis   . CHF (congestive heart failure) (HCC) 11/09/2015  . COPD (chronic obstructive pulmonary disease) (HCC)   . Depression   . Hypertension   . Leg swelling   . Myocardial infarction (HCC)   . Neuropathy   . Oxygen dependent   . Pneumonia   . Stroke [facility])     Patient Active Problem List   Diagnosis Date Noted  . Goals of care, counseling/discussion   . Encounter for hospice care discussion   . 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 AAA (abdominal aortic aneurysm, ruptured) (HCC)   . Shortness of breath 06/25/2017  . Chronic diastolic CHF (congestive heart failure) (HCC) 06/25/2017  . Chronic pain syndrome 06/25/2017  . Elevated brain natriuretic peptide (BNP) level   . CKD (chronic kidney disease) stage 3, GFR 30-59 ml/min 09/02/2016  . Diastolic dysfunction 09/02/2016  . Malnutrition of moderate degree 04/13/2016  . COPD exacerbation (HCC) 04/11/2016  . Anxiety 04/11/2016  . PNA (pneumonia) 04/11/2016  . Acute on chronic diastolic heart failure (HCC) 11/12/2015  . CHF (congestive heart failure) (HCC) 11/09/2015  . Acute on chronic respiratory failure (HCC) 11/05/2015  . Palliative care encounter   . DNR (do not resuscitate)   . Diverticulitis 11/02/2015  . Acute diverticulitis 11/02/2015  . AKI (acute kidney injury) (HCC) 11/02/2015  . Anemia  11/02/2015  . RLL pneumonia (HCC) 11/02/2015  . Pneumonia 04/12/2015  . Failure to thrive in adult 11/11/2014  . Acute encephalopathy 11/11/2014  . Generalized weakness 11/11/2014  . Hypokalemia 11/11/2014  . Bradycardia 11/11/2014  . Dehydration 01/26/2014  . Orthostatic dizziness 01/24/2014  . SOB (shortness of breath) 01/24/2014  . Chronic respiratory failure with hypoxia (HCC) 01/24/2014  . Cough with hemoptysis 01/24/2014  . COPD (chronic obstructive pulmonary disease) (HCC) 01/24/2014  . Acute renal failure (HCC) 01/24/2014  . Dizzy 01/24/2014  . Acute-on-chronic respiratory failure (HCC) 10/12/2012  . Chest pain 10/10/2012  . CAP (community acquired pneumonia) 10/10/2012  . Aortic aneurysm (HCC) 04/16/2012  . Chest pain 08/01/2011  . HYPERCALCEMIA 06/16/2008  . ALLERGIC RHINITIS 12/05/2007  . WEIGHT GAIN 11/07/2007  . PAIN IN JOINT, LOWER LEG 10/09/2007  . TOBACCO ABUSE 09/29/2007  . Abdominal aortic aneurysm (HCC) 09/29/2007  . Sciatica 09/24/2007  . BACK PAIN 09/24/2007  . Anxiety disorder 11/28/2006  . DEPRESSION 11/28/2006  . CATARACT NOS 11/28/2006  . Essential hypertension 11/28/2006  . MYOCARDIAL INFARCTION, HX OF 11/28/2006  . COPD 11/28/2006  . OVERACTIVE BLADDER 11/28/2006  . UTI'S, RECURRENT 11/28/2006  . LOW BACK PAIN 11/28/2006  . URINARY INCONTINENCE 11/28/2006    Past Surgical History:  Procedure Laterality Date  . APPENDECTOMY    . CHOLECYSTECTOMY      OB History    Gravida Para Term  Preterm AB Living   5 5 5          SAB TAB Ectopic Multiple Live Births                   Home Medications    Prior to Admission medications   Medication Sig Start Date End Date Taking? Authorizing Provider  acetaminophen (TYLENOL) 500 MG tablet Take 250-500 mg by mouth every 6 (six) hours as needed for mild pain, moderate pain, fever or headache.     [provider]  albuterol (PROVENTIL) (2.5 MG/3ML) 0.083% nebulizer solution Take 2.5 mg by  nebulization every 6 (six) hours as needed for shortness of breath (uses it regularly).     [provider]  ALPRAZolam Prudy Feeler) 1 MG tablet Take 1 mg by mouth 2 (two) times daily as needed.     [provider]  aspirin EC 81 MG tablet Take 81 mg by mouth daily.    [provider]  budesonide-formoterol (SYMBICORT) 160-4.5 MCG/ACT inhaler Inhale 1 puff into the lungs 2 (two) times daily.     [provider]  citalopram (CELEXA) 20 MG tablet Take 20 mg by mouth at bedtime.     [provider]  diphenhydrAMINE (BENADRYL) 25 MG tablet Take 25 mg by mouth every 6 (six) hours as needed for allergies.    [provider]  gabapentin (NEURONTIN) 100 MG capsule Take 100 mg by mouth 5 (five) times daily.     [provider]  isosorbide mononitrate (IMDUR) 30 MG 24 hr tablet Take 1.5 tablets (45 mg total) by mouth daily. Patient taking differently: Take 30 mg by mouth daily.  06/27/17   Kari Baars, MD  nitroGLYCERIN (NITROSTAT) 0.4 MG SL tablet Place 0.4 mg under the tongue every 5 (five) minutes as needed for chest pain. 08/17/16   [provider]  oxyCODONE (ROXICODONE) 15 MG immediate release tablet Take 1 tablet by mouth 4 (four) times daily. 06/21/17   [provider]  predniSONE (STERAPRED UNI-PAK 21 TAB) 10 MG (21) TBPK tablet Take by package instructions Patient taking differently: Take 50-60 mg by mouth daily. Take by package instructions 06/27/17   Kari Baars, MD  tiotropium (SPIRIVA) 18 MCG inhalation capsule Place 18 mcg into inhaler and inhale at bedtime.     [provider]  torsemide (DEMADEX) 20 MG tablet Take 1 tablet (20 mg total) by mouth daily. 06/27/17   Kari Baars, MD  traMADol (ULTRAM) 50 MG tablet Take 1 tablet (50 mg total) by mouth every 6 (six) hours as needed. Patient not taking: Reported on 07/09/2017 02/10/17   [name], MD    Family History Family History  Problem Relation  Age of Onset  . Diabetes Brother   . Cancer Brother        Throat cancer, Heart attack  . Heart disease Brother   . Heart attack Brother   . Cancer Father   . Cancer Sister     Social History Social History  Substance Use Topics  . Smoking status: Former Smoker    Years: 50.00    Types: Cigarettes    Quit date: 12/31/2010  . Smokeless tobacco: Former Neurosurgeon    Quit date: 12/31/2010  . Alcohol use No     Allergies   Ciprofloxacin hcl; Nsaids; Other; Sulfa antibiotics; Sulfur; and Tomato   Review of Systems Review of Systems  Level 5 caveat because of dementia/acuity of illness.  Physical Exam Updated Vital Signs BP 117/85 (  BP Location: Left Arm)   Pulse 64   Temp 99.3 F (37.4 C) (Oral)   Resp 16   Ht 5\' 1"  (1.549 m)   Wt 60.8 kg (134 lb)   SpO2 (!) 89%   BMI 25.32 kg/m   Physical Exam  Constitutional: No distress.  HENT:  Head: Normocephalic and atraumatic.  Eyes: Conjunctivae are normal. Right eye exhibits no discharge. Left eye exhibits no discharge.  Neck: Neck supple.  Cardiovascular: Normal rate, regular rhythm and normal heart sounds.  Exam reveals no gallop and no friction rub.   No murmur heard. Pulmonary/Chest: Effort normal and breath sounds normal. No respiratory distress.  Abdominal: Soft. She exhibits mass. She exhibits no distension. There is no tenderness.  Palpable aorta felt well into L abdomen.   Musculoskeletal: She exhibits no edema or tenderness.  Neurological:  Drowsy. Opens eyes to voice. Responds to some question although I have a hard time understanding her.   Skin: Skin is warm and dry. She is not diaphoretic.  Nursing note and vitals reviewed.    ED Treatments / Results  Labs (all labs ordered are listed, but only abnormal results are displayed) Labs Reviewed - No data to display  EKG  EKG Interpretation None       Radiology No results found.  Procedures Procedures (including critical care time)  Medications Ordered  in ED Medications - No data to display   Initial Impression / Assessment and Plan / ED Course  I have reviewed the triage vital signs and the nursing notes.  Pertinent labs & imaging results that were available during my care of the patient were reviewed by me and considered in my medical decision making (see chart for details).  Clinical Course as of Jul 10 1453  Wed Jul 10, 2017  1331 Discussed with her son, Melanie Lowe. Confirmed her wishes to be at home and made comfortable. Says she was found next to her bed and he is not sure if she had rolled out or had tried to get up and fell. Says he is in process of trying to get her a hospital bed. Inquiring about fentanyl patch. Says she "will be out of it for a few hours after taking oxycodone" and then when more responsive will be complaining of a lot of pain again. Re-iterated that her life unfortunately is measured in days to weeks at this point. He is comfortable with not obtaining additional testing at this time. Will continue IVF. She currently denies being in any pain. She seems to be sleeping in bed and doesn't appear distressed.   [SK]    Clinical Course User Index [SK] Raeford Razor, MD    [name] with fall. Known leaking AAA and not operative candidate. Son insists on keeping at home. Hospice offered. He declines. Questionable requests for pain medication. She is terminally ill but has not seemed distressed while in the ED.   Final Clinical Impressions(s) / ED Diagnoses   Final diagnoses:  Fall, initial encounter  Hypotension, unspecified hypotension type  Abdominal aortic aneurysm (AAA), not a candidate for repair [facility])    New Prescriptions New Prescriptions   No medications on file     Raeford Razor, MD 07/20/17 1456

## 2017-07-10 NOTE — ED Notes (Addendum)
Resting quietly in room Does not speak, respond to questions There are no family members at bedside to answer questions although Dr Kirtland Bouchard has spoken with son per telephone

## 2017-07-10 NOTE — ED Notes (Signed)
Sent home from [facility] yesterday Family and pt declined hospice care unpon discharge yesterday   PER NOTE FROM YESTERDAY, PT IS A DNR

## 2017-07-10 NOTE — ED Notes (Signed)
Son, Jonny Ruiz, reports with this reporter and Verlon Au, RN, CN that he has no questions regarding her discharge instructions

## 2017-07-10 NOTE — ED Notes (Signed)
Call to home and spoke with female who identifies as "daughter in law"  She reports that they have no car right now. She is agreeable that pt be sent home with EMS- Also acknowledges that family will call Dr Juanetta Gosling for change of mind regarding hospice.

## 2017-07-10 NOTE — ED Notes (Signed)
EMS called for transport home.

## 2017-07-10 NOTE — ED Notes (Signed)
Awaiting family member arrival that plan of care may be determined

## 2017-07-11 ENCOUNTER — Emergency Department (HOSPITAL_COMMUNITY)
Admission: EM | Admit: 2017-07-11 | Discharge: 2017-07-11 | Disposition: A | Discharge status: Home / Self Care | Payer: Medicare HMO | Attending: Emergency Medicine | Admitting: Emergency Medicine

## 2017-07-11 ENCOUNTER — Encounter (HOSPITAL_COMMUNITY): Payer: Self-pay | Admitting: Cardiology

## 2017-07-11 ENCOUNTER — Emergency Department (HOSPITAL_COMMUNITY): Payer: Medicare HMO

## 2017-07-11 DIAGNOSIS — I5032 Chronic diastolic (congestive) heart failure: Secondary | ICD-10-CM | POA: Insufficient documentation

## 2017-07-11 DIAGNOSIS — I714 Abdominal aortic aneurysm, without rupture: Secondary | ICD-10-CM | POA: Diagnosis not present

## 2017-07-11 DIAGNOSIS — Z7982 Long term (current) use of aspirin: Secondary | ICD-10-CM | POA: Insufficient documentation

## 2017-07-11 DIAGNOSIS — Z515 Encounter for palliative care: Secondary | ICD-10-CM | POA: Diagnosis not present

## 2017-07-11 DIAGNOSIS — I713 Abdominal aortic aneurysm, ruptured, unspecified: Secondary | ICD-10-CM

## 2017-07-11 DIAGNOSIS — Z7189 Other specified counseling: Secondary | ICD-10-CM

## 2017-07-11 DIAGNOSIS — K2970 Gastritis, unspecified, without bleeding: Secondary | ICD-10-CM | POA: Diagnosis not present

## 2017-07-11 DIAGNOSIS — R279 Unspecified lack of coordination: Secondary | ICD-10-CM | POA: Diagnosis not present

## 2017-07-11 DIAGNOSIS — Z7401 Bed confinement status: Secondary | ICD-10-CM | POA: Diagnosis not present

## 2017-07-11 DIAGNOSIS — Z66 Do not resuscitate: Secondary | ICD-10-CM | POA: Insufficient documentation

## 2017-07-11 DIAGNOSIS — N183 Chronic kidney disease, stage 3 (moderate): Secondary | ICD-10-CM | POA: Diagnosis not present

## 2017-07-11 DIAGNOSIS — I959 Hypotension, unspecified: Secondary | ICD-10-CM | POA: Diagnosis not present

## 2017-07-11 DIAGNOSIS — G8929 Other chronic pain: Secondary | ICD-10-CM | POA: Insufficient documentation

## 2017-07-11 DIAGNOSIS — R0902 Hypoxemia: Secondary | ICD-10-CM | POA: Diagnosis not present

## 2017-07-11 DIAGNOSIS — Z79891 Long term (current) use of opiate analgesic: Secondary | ICD-10-CM | POA: Diagnosis not present

## 2017-07-11 DIAGNOSIS — Z87891 Personal history of nicotine dependence: Secondary | ICD-10-CM | POA: Insufficient documentation

## 2017-07-11 DIAGNOSIS — I130 Hypertensive heart and chronic kidney disease with heart failure and stage 1 through stage 4 chronic kidney disease, or unspecified chronic kidney disease: Secondary | ICD-10-CM | POA: Diagnosis not present

## 2017-07-11 DIAGNOSIS — J449 Chronic obstructive pulmonary disease, unspecified: Secondary | ICD-10-CM | POA: Diagnosis not present

## 2017-07-11 DIAGNOSIS — Z79899 Other long term (current) drug therapy: Secondary | ICD-10-CM | POA: Diagnosis not present

## 2017-07-11 DIAGNOSIS — R4182 Altered mental status, unspecified: Secondary | ICD-10-CM | POA: Diagnosis present

## 2017-07-11 DIAGNOSIS — R10812 Left upper quadrant abdominal tenderness: Secondary | ICD-10-CM | POA: Diagnosis not present

## 2017-07-11 DIAGNOSIS — D62 Acute posthemorrhagic anemia: Secondary | ICD-10-CM | POA: Diagnosis not present

## 2017-07-11 DIAGNOSIS — S0990XA Unspecified injury of head, initial encounter: Secondary | ICD-10-CM | POA: Diagnosis not present

## 2017-07-11 DIAGNOSIS — R531 Weakness: Secondary | ICD-10-CM | POA: Diagnosis not present

## 2017-07-11 DIAGNOSIS — R404 Transient alteration of awareness: Secondary | ICD-10-CM | POA: Diagnosis not present

## 2017-07-11 LAB — COMPREHENSIVE METABOLIC PANEL
ALT: 13 U/L — AB (ref 14–54)
AST: 17 U/L (ref 15–41)
Albumin: 2.3 g/dL — ABNORMAL LOW (ref 3.5–5.0)
Alkaline Phosphatase: 57 U/L (ref 38–126)
Anion gap: 8 (ref 5–15)
BUN: 52 mg/dL — AB (ref 6–20)
CO2: 25 mmol/L (ref 22–32)
CREATININE: 1.71 mg/dL — AB (ref 0.44–1.00)
Calcium: 8.3 mg/dL — ABNORMAL LOW (ref 8.9–10.3)
Chloride: 106 mmol/L (ref 101–111)
GFR calc Af Amer: 29 mL/min — ABNORMAL LOW (ref >60)
GFR, EST NON AFRICAN AMERICAN: 25 mL/min — AB (ref >60)
Glucose, Bld: 275 mg/dL — ABNORMAL HIGH (ref 65–99)
Potassium: 4 mmol/L (ref 3.5–5.1)
Sodium: 139 mmol/L (ref 135–145)
TOTAL PROTEIN: 5.4 g/dL — AB (ref 6.5–8.1)
Total Bilirubin: 1.3 mg/dL — ABNORMAL HIGH (ref 0.3–1.2)

## 2017-07-11 LAB — CBC WITH DIFFERENTIAL/PLATELET
BASOS ABS: 0 10*3/uL (ref 0.0–0.1)
Basophils Relative: 0 %
EOS ABS: 0 10*3/uL (ref 0.0–0.7)
EOS PCT: 0 %
HCT: 15.6 % — ABNORMAL LOW (ref 36.0–46.0)
Hemoglobin: 5.2 g/dL — CL (ref 12.0–15.0)
Lymphocytes Relative: 5 %
Lymphs Abs: 0.6 10*3/uL — ABNORMAL LOW (ref 0.7–4.0)
MCH: 31.9 pg (ref 26.0–34.0)
MCHC: 33.3 g/dL (ref 30.0–36.0)
MCV: 95.7 fL (ref 78.0–100.0)
Monocytes Absolute: 1.7 10*3/uL — ABNORMAL HIGH (ref 0.1–1.0)
Monocytes Relative: 12 %
Neutro Abs: 11.2 10*3/uL — ABNORMAL HIGH (ref 1.7–7.7)
Neutrophils Relative %: 83 %
PLATELETS: 222 10*3/uL (ref 150–400)
RBC: 1.63 MIL/uL — AB (ref 3.87–5.11)
RDW: 13.7 % (ref 11.5–15.5)
WBC: 13.5 10*3/uL — AB (ref 4.0–10.5)

## 2017-07-11 LAB — I-STAT CG4 LACTIC ACID, ED: LACTIC ACID, VENOUS: 3.76 mmol/L — AB (ref 0.5–1.9)

## 2017-07-11 LAB — URINALYSIS, ROUTINE W REFLEX MICROSCOPIC
Bilirubin Urine: NEGATIVE
GLUCOSE, UA: NEGATIVE mg/dL
KETONES UR: NEGATIVE mg/dL
LEUKOCYTES UA: NEGATIVE
NITRITE: NEGATIVE
PH: 5 (ref 5.0–8.0)
Protein, ur: NEGATIVE mg/dL
Specific Gravity, Urine: 1.013 (ref 1.005–1.030)

## 2017-07-11 LAB — BLOOD GAS, ARTERIAL
Acid-base deficit: 2 mmol/L (ref 0.0–2.0)
BICARBONATE: 22.8 mmol/L (ref 20.0–28.0)
O2 CONTENT: 4 L/min
O2 SAT: 92.5 %
PO2 ART: 67.7 mmHg — AB (ref 83.0–108.0)
pCO2 arterial: 34.4 mmHg (ref 32.0–48.0)
pH, Arterial: 7.419 (ref 7.350–7.450)

## 2017-07-11 LAB — CBG MONITORING, ED: Glucose-Capillary: 239 mg/dL — ABNORMAL HIGH (ref 65–99)

## 2017-07-11 MED ORDER — SODIUM CHLORIDE 0.9 % IV BOLUS (SEPSIS)
1000.0000 mL | Freq: Once | INTRAVENOUS | Status: AC
  Administered 2017-07-11: 1000 mL via INTRAVENOUS

## 2017-07-11 MED ORDER — ONDANSETRON HCL 4 MG/2ML IJ SOLN
4.0000 mg | Freq: Once | INTRAMUSCULAR | Status: AC
  Administered 2017-07-11: 4 mg via INTRAVENOUS
  Filled 2017-07-11: qty 2

## 2017-07-11 MED ORDER — MORPHINE SULFATE (PF) 4 MG/ML IV SOLN
4.0000 mg | Freq: Once | INTRAVENOUS | Status: AC
  Administered 2017-07-11: 4 mg via INTRAVENOUS
  Filled 2017-07-11: qty 1

## 2017-07-11 MED ORDER — SODIUM CHLORIDE 0.9 % IV SOLN
INTRAVENOUS | Status: DC
  Administered 2017-07-11: 13:00:00 via INTRAVENOUS

## 2017-07-11 NOTE — ED Notes (Signed)
[facility] called for transport home.

## 2017-07-11 NOTE — ED Triage Notes (Signed)
Pt has had several visits recently to er and 1 admission.  EMS called out for fall.  EMS states that the house is filthy and the living conditions are horrible.  States bed bugs and roaches in the house.  Pt has some confusion for the past week.  Initial sats 74% on 3 liters.  Pt on 6 liters now and 94%.  110/70- b/p.  CBG 241.  Temp 95.8 oral.  NSR with PVC's rate 70's.

## 2017-07-11 NOTE — Clinical Social Work Note (Signed)
LCSW sent clinicals to [facility] home at request of attending.     Donnetta Gillin, Juleen China, LCSW

## 2017-07-11 NOTE — ED Notes (Signed)
Per Dr. Particia Nearing , spoke with pt's family and they are consenting to pt going to hospice house.

## 2017-07-11 NOTE — Care Management (Signed)
CM called, patient's family needs hospital bed. Declines hospice services. Offered choice of DME companies. [facility] notified and will deliver bed tomorrow. Family aware.

## 2017-07-11 NOTE — Clinical Social Work Note (Signed)
Patient Information   Patient Name Melanie Lowe, Melanie Lowe ([id number]) Sex Female DOB 16-Aug-1924 SSN: [id number]   Room Bed  APA18 APA18  Patient Demographics   Address [address] [place] [address] Phone [phone] (Home) *Preferred* [phone] (Mobile) E-mail Address [email or web address]  Patient Ethnicity & Race   Ethnic Group Patient Race  Not Hispanic or Latino White or Caucasian  Emergency Contact(s)   Name Relation Home Work [place] Son [phone]    Woods,[name] Relative [phone]  [phone]  Documents on File    Status Date Received Description  Documents for the Patient  EMR Medication Summary Not Received    EMR Immunization Summary Not Received    EMR Problem Summary Not Received    EMR Patient Summary Not Received    [place] HIPAA NOTICE OF PRIVACY - Scanned Received 02/05/11   [place] E-Signature HIPAA Notice of Privacy Received 02/05/11   [place] E-Signature HIPAA Notice of Privacy Spanish Not Received    Driver's License Not Received    Advance Directives/Living Will/HCPOA/POA Not Received    Historic Radiology Documentation Not Received    Historic Radiology Documentation Not Received    Insurance Card Received 07/13/11   Financial Application Not Received    Insurance Card Not Received    Release of Information Not Received    AMB Intake Forms/Questionnaires Not Received    HIM ROI Authorization Not Received    Release of Information Not Received    HIM ROI Authorization  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   AMB Intake Forms/Questionnaires  09/06/16 09/17 [facility] REFERRAL  [place] E-Signature HIPAA Notice of Privacy Signed 03/06/17   Documents for the Encounter  AOB (Assignment of  Insurance Benefits) Unknown 07/11/17 unable to obtain  E-signature AOB     MEDICARE RIGHTS Unknown 07/11/17 unable to obtain  E-signature Medicare Rights     Admission Information   Attending Provider Admitting Provider Admission Type Admission Date/Time  Jacalyn Lefevre, MD  Emergency 07/11/17 1103  Discharge Date Hospital Service Auth/Cert Status Service Area   Emergency Medicine Incomplete [place] SERVICE AREA  Unit Room/Bed Admission Status   AP-EMERGENCY DEPT APA18/APA18 Admission (Confirmed)   Admission   Complaint  AMS  Hospital Account   Name Acct ID Class Status Primary Coverage  Demeria, Ladino [id number] Emergency Open HUMANA MEDICARE - HUMANA MEDICARE HMO      Guarantor Account (for Hospital Account [id number])   Name Relation to Pt Service Area Active? Acct Type  Joaquin Music Self CHSA Yes Personal/Family  Address Phone    [address] [place], [place] [address] [phone](H)        Coverage Information (for Hospital Account [id number])   F/O Payor/Plan Precert #  [facility] MEDICARE/HUMANA MEDICARE HMO   Subscriber Subscriber #  Jovan, Bona [id number]  Address Phone  [address] [address] [place], [place] [address] [phone]

## 2017-07-11 NOTE — Progress Notes (Signed)
Daily Progress Note   Patient Name: Melanie Lowe       Date: 07/11/2017 DOB: Oct 21, 1924  Age: 83 y.o. MRN#: [id number] Attending Physician: Jacalyn Lefevre, MD Primary Care Physician: Kari Baars, MD Admit Date: 07/11/2017  Reason for Consultation/Follow-up: Establishing goals of care and Psychosocial/spiritual support  Subjective: Son Melanie Lowe and his fiance Melanie Lowe are at bedside. We meet in the family room to discuss Melanie Lowe's changes. They state that Melanie Lowe was found on the floor/fell out of bed, she complained of pain after her fall and shared that she wanted to come to the hospital because she was frightened that something had changed with her aneurysm. We talk about Melanie Lowe's acute health problem, the lack of available interventions. I share what's normal and expected as people come near end of life including sleeping more, eating less and interacting less. Melanie Lowe states that his mother was not eating much over the past few days, but last night she had a large appetite, and he was able to feed her.  Melanie Lowe is not a surgical candidate. There are no interventions to change what is happening. This information is shared repeatedly with the family by multiple providers. Her hemoglobin has dropped from 11 on 7/7 to 5 today. We discuss at length returning to the ED, and the benefits of hospice at home. Son states that they will continue to come to the ED, expecting that she will likely pass here in [facility] emergency room.  Length of Stay: 0  Current Medications: Scheduled Meds:    Continuous Infusions: . sodium chloride 125 mL/hr at 07/11/17 1307    PRN Meds:   Physical Exam  Constitutional: No distress.  Frail and thin, acutely ill appearing makes but does not  keep eye contact  HENT:  Head: Normocephalic and atraumatic.  Cardiovascular: Normal rate and regular rhythm.   Pulmonary/Chest: Effort normal. No respiratory distress.  Abdominal: Soft. She exhibits distension.  Musculoskeletal: She exhibits no edema.  Muscle wasting  Neurological: She is alert.  Skin: Skin is warm and dry.  Nursing note and vitals reviewed.           Vital Signs: BP 113/68   Pulse (!) 29   Temp 98.7 F (37.1 C) (Rectal)   Resp 17   SpO2 (!) 74%  SpO2:  SpO2: (!) 74 % O2 Device:   O2 Flow Rate:    Intake/output summary:  Intake/Output Summary (Last 24 hours) at 07/11/17 1457 Last data filed at 07/11/17 1237  Gross per 24 hour  Intake             1000 ml  Output                0 ml  Net             1000 ml   LBM:   Baseline Weight:   Most recent weight:         Palliative Assessment/Data:      Patient Active Problem List   Diagnosis Date Noted  . Goals of care, counseling/discussion   . Encounter for hospice care discussion   . 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 AAA (abdominal aortic aneurysm, ruptured) (HCC)   . Shortness of breath 06/25/2017  . Chronic diastolic CHF (congestive heart failure) (HCC) 06/25/2017  . Chronic pain syndrome 06/25/2017  . Elevated brain natriuretic peptide (BNP) level   . CKD (chronic kidney disease) stage 3, GFR 30-59 ml/min 09/02/2016  . Diastolic dysfunction 09/02/2016  . Malnutrition of moderate degree 04/13/2016  . COPD exacerbation (HCC) 04/11/2016  . Anxiety 04/11/2016  . PNA (pneumonia) 04/11/2016  . Acute on chronic diastolic heart failure (HCC) 11/12/2015  . CHF (congestive heart failure) (HCC) 11/09/2015  . Acute on chronic respiratory failure (HCC) 11/05/2015  . Palliative care encounter   . DNR (do not resuscitate)   . Diverticulitis 11/02/2015  . Acute diverticulitis 11/02/2015  . AKI (acute kidney injury) (HCC) 11/02/2015  . Anemia 11/02/2015  . RLL pneumonia (HCC) 11/02/2015  .  Pneumonia 04/12/2015  . Failure to thrive in adult 11/11/2014  . Acute encephalopathy 11/11/2014  . Generalized weakness 11/11/2014  . Hypokalemia 11/11/2014  . Bradycardia 11/11/2014  . Dehydration 01/26/2014  . Orthostatic dizziness 01/24/2014  . SOB (shortness of breath) 01/24/2014  . Chronic respiratory failure with hypoxia (HCC) 01/24/2014  . Cough with hemoptysis 01/24/2014  . COPD (chronic obstructive pulmonary disease) (HCC) 01/24/2014  . Acute renal failure (HCC) 01/24/2014  . Dizzy 01/24/2014  . Acute-on-chronic respiratory failure (HCC) 10/12/2012  . Chest pain 10/10/2012  . CAP (community acquired pneumonia) 10/10/2012  . Aortic aneurysm (HCC) 04/16/2012  . Chest pain 08/01/2011  . HYPERCALCEMIA 06/16/2008  . ALLERGIC RHINITIS 12/05/2007  . WEIGHT GAIN 11/07/2007  . PAIN IN JOINT, LOWER LEG 10/09/2007  . TOBACCO ABUSE 09/29/2007  . Abdominal aortic aneurysm (HCC) 09/29/2007  . Sciatica 09/24/2007  . BACK PAIN 09/24/2007  . Anxiety disorder 11/28/2006  . DEPRESSION 11/28/2006  . CATARACT NOS 11/28/2006  . Essential hypertension 11/28/2006  . MYOCARDIAL INFARCTION, HX OF 11/28/2006  . COPD 11/28/2006  . OVERACTIVE BLADDER 11/28/2006  . UTI'S, RECURRENT 11/28/2006  . LOW BACK PAIN 11/28/2006  . URINARY INCONTINENCE 11/28/2006    Palliative Care Assessment & Plan   Patient Profile: 83 y.o. female  with past medical history of AAA, anxiety and depression, arthritis, CHF diagnosed 11/2015, COPD oxygen dependent (multiple ED visits), history of stroke, history of MI Seen in the ED on 07/11/2017 with ruptured aneurysm of artery.   Assessment: ruptured AAA; Melanie Lowe is not a surgical candidate. There are no interventions to change what is happening. This information is shared repeatedly with the family by multiple providers. Her hemoglobin has dropped from 11 on 7/7 to 5 today. We discuss at length returning to the  ED, and the benefits of hospice at home. Son states  that they will continue to come to the ED, expecting that she will likely pass here in [facility] emergency room.  Recommendations/Plan:  control pain and anxiety through medications. Melanie Lowe has a current prescription for pain medication from her trusted PCP, Dr. Juanetta Gosling.  Goals of Care and Additional Recommendations:  Limitations on Scope of Treatment: No CPR or intubation, but will return to ED as needed.  Code Status: Code Status History    Date Active Date Inactive Code Status Order ID Comments User Context   07/06/2017  5:23 AM 07/09/2017  4:05 PM DNR [id number]  Haydee Monica, MD Inpatient   06/25/2017  7:59 AM 06/27/2017  2:37 PM DNR [id number]  Catarina Hartshorn, MD Inpatient   09/02/2016 11:59 PM 09/04/2016  4:58 PM DNR [id number]  Eduard Clos, MD Inpatient   06/07/2016 12:58 AM 06/07/2016  6:12 PM DNR [id number]  Haydee Monica, MD Inpatient   04/11/2016 10:46 PM 04/16/2016  4:10 PM DNR [id number]  Delano Metz, MD Inpatient   11/11/2015  8:12 AM 11/12/2015  6:52 PM DNR [id number]  Kari Baars, MD Inpatient   11/09/2015 10:37 PM 11/11/2015  8:12 AM Full Code [id number]  Houston Siren, MD Inpatient   11/03/2015  8:02 AM 11/06/2015  4:36 PM DNR [id number]  Kari Baars, MD Inpatient   11/02/2015 10:47 PM 11/03/2015  8:02 AM Full Code [id number]  Ron Parker, MD Inpatient   04/12/2015 12:58 AM 04/13/2015  8:29 PM DNR [id number]  Meredeth Ide, MD Inpatient   11/11/2014  7:43 PM 11/16/2014 11:30 AM DNR [id number]  Elliot Cousin, MD Inpatient   01/24/2014 10:04 PM 01/26/2014  2:54 PM Full Code [id number]  Haydee Monica, MD Inpatient   10/10/2012  1:20 AM 10/12/2012  4:16 PM Full Code [id number]  Jackey Loge, RN Inpatient   11/01/2011  8:41 AM 11/02/2011  3:46 PM DNR [id number]  Fredirick Maudlin, MD Inpatient    Questions for Most Recent Historical Code Status (Order [id number])    Question Answer Comment   In the event of cardiac or respiratory ARREST Do not call a "code blue"    In the event of  cardiac or respiratory ARREST Do not perform Intubation, CPR, defibrillation or ACLS    In the event of cardiac or respiratory ARREST Use medication by any route, position, wound care, and other measures to relive pain and suffering. May use oxygen, suction and manual treatment of airway obstruction as needed for comfort.        Prognosis:   Hours - Days  Discharge Planning:  Home with Home Health  Care plan was discussed with nursing staff, case manager, ED physician.  Thank you for allowing the Palliative Medicine Team to assist in the care of this patient.   Time In: 1405 Time Out: 1450 Total Time 45 minutes Prolonged Time Billed  no       Greater than 50%  of this time was spent counseling and coordinating care related to the above assessment and plan.  Katheran Awe, NP  Please contact Palliative Medicine Team phone at [phone] for questions and concerns.

## 2017-07-11 NOTE — Care Management (Signed)
    Durable Medical Equipment        Start     Ordered   07/11/17 1459  For home use only DME Hospital bed  Once    Question Answer Comment  The above medical condition requires: Patient requires the ability to reposition frequently   Head must be elevated greater than: 30 degrees   Bed type Semi-electric      07/11/17 1459

## 2017-07-11 NOTE — ED Provider Notes (Signed)
[facility] DEPT Provider Note   CSN: [id number] Arrival date & time: 07/11/17  1055     History   Chief Complaint Chief Complaint  Patient presents with  . Altered Mental Status    HPI Melanie Lowe is a 83 y.o. female.  Pt presents to the ED today with altered mental status.  Pt was admitted from 7/7 to 7/10 with a ruptured AAA.  The pt is not a candidate for surgery.  Family refused home health or hospice and she went home.  Pt's family notified that pt does not have much time to live. The pt came back yesterday for a fall, but family did not want anything done.  The pt is back today with ms change.  Pt is unable to give any hx.  EMS said the pt's living conditions were very poor and they don't think the family is able to care for pt.      Past Medical History:  Diagnosis Date  . AAA (abdominal aortic aneurysm) (HCC)   . Anxiety   . Arthritis   . CHF (congestive heart failure) (HCC) 11/09/2015  . COPD (chronic obstructive pulmonary disease) (HCC)   . Depression   . Hypertension   . Leg swelling   . Myocardial infarction (HCC)   . Neuropathy   . Oxygen dependent   . Pneumonia   . Stroke [facility])     Patient Active Problem List   Diagnosis Date Noted  . Goals of care, counseling/discussion   . Encounter for hospice care discussion   . 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 AAA (abdominal aortic aneurysm, ruptured) (HCC)   . Shortness of breath 06/25/2017  . Chronic diastolic CHF (congestive heart failure) (HCC) 06/25/2017  . Chronic pain syndrome 06/25/2017  . Elevated brain natriuretic peptide (BNP) level   . CKD (chronic kidney disease) stage 3, GFR 30-59 ml/min 09/02/2016  . Diastolic dysfunction 09/02/2016  . Malnutrition of moderate degree 04/13/2016  . COPD exacerbation (HCC) 04/11/2016  . Anxiety 04/11/2016  . PNA (pneumonia) 04/11/2016  . Acute on chronic diastolic heart failure (HCC) 11/12/2015  . CHF (congestive heart failure) (HCC)  11/09/2015  . Acute on chronic respiratory failure (HCC) 11/05/2015  . Palliative care encounter   . DNR (do not resuscitate)   . Diverticulitis 11/02/2015  . Acute diverticulitis 11/02/2015  . AKI (acute kidney injury) (HCC) 11/02/2015  . Anemia 11/02/2015  . RLL pneumonia (HCC) 11/02/2015  . Pneumonia 04/12/2015  . Failure to thrive in adult 11/11/2014  . Acute encephalopathy 11/11/2014  . Generalized weakness 11/11/2014  . Hypokalemia 11/11/2014  . Bradycardia 11/11/2014  . Dehydration 01/26/2014  . Orthostatic dizziness 01/24/2014  . SOB (shortness of breath) 01/24/2014  . Chronic respiratory failure with hypoxia (HCC) 01/24/2014  . Cough with hemoptysis 01/24/2014  . COPD (chronic obstructive pulmonary disease) (HCC) 01/24/2014  . Acute renal failure (HCC) 01/24/2014  . Dizzy 01/24/2014  . Acute-on-chronic respiratory failure (HCC) 10/12/2012  . Chest pain 10/10/2012  . CAP (community acquired pneumonia) 10/10/2012  . Aortic aneurysm (HCC) 04/16/2012  . Chest pain 08/01/2011  . HYPERCALCEMIA 06/16/2008  . ALLERGIC RHINITIS 12/05/2007  . WEIGHT GAIN 11/07/2007  . PAIN IN JOINT, LOWER LEG 10/09/2007  . TOBACCO ABUSE 09/29/2007  . Abdominal aortic aneurysm (HCC) 09/29/2007  . Sciatica 09/24/2007  . BACK PAIN 09/24/2007  . Anxiety disorder 11/28/2006  . DEPRESSION 11/28/2006  . CATARACT NOS 11/28/2006  . Essential hypertension 11/28/2006  . MYOCARDIAL INFARCTION, HX OF 11/28/2006  .  COPD 11/28/2006  . OVERACTIVE BLADDER 11/28/2006  . UTI'S, RECURRENT 11/28/2006  . LOW BACK PAIN 11/28/2006  . URINARY INCONTINENCE 11/28/2006    Past Surgical History:  Procedure Laterality Date  . APPENDECTOMY    . CHOLECYSTECTOMY      OB History    Gravida Para Term Preterm AB Living   5 5 5          SAB TAB Ectopic Multiple Live Births                   Home Medications    Prior to Admission medications   Medication Sig Start Date End Date Taking? Authorizing Provider    acetaminophen (TYLENOL) 500 MG tablet Take 250-500 mg by mouth every 6 (six) hours as needed for mild pain, moderate pain, fever or headache.     [provider]  albuterol (PROVENTIL) (2.5 MG/3ML) 0.083% nebulizer solution Take 2.5 mg by nebulization every 6 (six) hours as needed for shortness of breath (uses it regularly).     [provider]  ALPRAZolam Prudy Feeler) 1 MG tablet Take 1 mg by mouth 2 (two) times daily as needed.     [provider]  aspirin EC 81 MG tablet Take 81 mg by mouth daily.    [provider]  budesonide-formoterol (SYMBICORT) 160-4.5 MCG/ACT inhaler Inhale 1 puff into the lungs 2 (two) times daily.     [provider]  citalopram (CELEXA) 20 MG tablet Take 20 mg by mouth at bedtime.     [provider]  diphenhydrAMINE (BENADRYL) 25 MG tablet Take 25 mg by mouth every 6 (six) hours as needed for allergies.    [provider]  gabapentin (NEURONTIN) 100 MG capsule Take 100 mg by mouth 5 (five) times daily.     [provider]  morphine (MS CONTIN) 15 MG 12 hr tablet Take 1 tablet (15 mg total) by mouth every 12 (twelve) hours. 07/10/17   Raeford Razor, MD  oxyCODONE (ROXICODONE) 15 MG immediate release tablet Take 1 tablet by mouth 4 (four) times daily. 06/21/17   [provider]  predniSONE (STERAPRED UNI-PAK 21 TAB) 10 MG (21) TBPK tablet Take by package instructions Patient taking differently: Take 50-60 mg by mouth daily. Take by package instructions 06/27/17   Kari Baars, MD  tiotropium (SPIRIVA) 18 MCG inhalation capsule Place 18 mcg into inhaler and inhale at bedtime.     [provider]  traMADol (ULTRAM) 50 MG tablet Take 1 tablet (50 mg total) by mouth every 6 (six) hours as needed. 02/10/17   [name], MD    Family History Family History  Problem Relation Age of Onset  . Diabetes Brother   . Cancer Brother        Throat cancer, Heart attack  . Heart disease  Brother   . Heart attack Brother   . Cancer Father   . Cancer Sister     Social History Social History  Substance Use Topics  . Smoking status: Former Smoker    Years: 50.00    Types: Cigarettes    Quit date: 12/31/2010  . Smokeless tobacco: Former Neurosurgeon    Quit date: 12/31/2010  . Alcohol use No     Allergies   Ciprofloxacin hcl; Nsaids; Other; Sulfa antibiotics; Sulfur; and Tomato   Review of Systems Review of Systems  Unable to perform ROS: Mental status change     Physical Exam Updated Vital Signs BP 113/68   Pulse (!) 29  Temp 98.7 F (37.1 C) (Rectal)   Resp 17   SpO2 (!) 74%   Physical Exam  Constitutional: She appears well-developed and well-nourished.  HENT:  Head: Normocephalic and atraumatic.  Right Ear: External ear normal.  Left Ear: External ear normal.  Nose: Nose normal.  Mouth/Throat: Mucous membranes are dry.  Eyes: Pupils are equal, round, and reactive to light. Conjunctivae and EOM are normal.  Neck: Normal range of motion. Neck supple.  Cardiovascular: Normal rate, regular rhythm, normal heart sounds and intact distal pulses.   Pulmonary/Chest: Effort normal and breath sounds normal.  Abdominal: Soft. Bowel sounds are normal.  Musculoskeletal: Normal range of motion.  Neurological:  Pt is awke, but very confused.  She is moving all 4 extremities.  Skin: Skin is warm.  Nursing note and vitals reviewed.    ED Treatments / Results  Labs (all labs ordered are listed, but only abnormal results are displayed) Labs Reviewed  COMPREHENSIVE METABOLIC PANEL - Abnormal; Notable for the following:       Result Value   Glucose, Bld 275 (*)    BUN 52 (*)    Creatinine, Ser 1.71 (*)    Calcium 8.3 (*)    Total Protein 5.4 (*)    Albumin 2.3 (*)    ALT 13 (*)    Total Bilirubin 1.3 (*)    GFR calc non Af Amer 25 (*)    GFR calc Af Amer 29 (*)    All other components within normal limits  CBC WITH DIFFERENTIAL/PLATELET - Abnormal; Notable  for the following:    WBC 13.5 (*)    RBC 1.63 (*)    Hemoglobin 5.2 (*)    HCT 15.6 (*)    Neutro Abs 11.2 (*)    Lymphs Abs 0.6 (*)    Monocytes Absolute 1.7 (*)    All other components within normal limits  URINALYSIS, ROUTINE W REFLEX MICROSCOPIC - Abnormal; Notable for the following:    APPearance HAZY (*)    Hgb urine dipstick SMALL (*)    Bacteria, UA FEW (*)    Squamous Epithelial / LPF 0-5 (*)    All other components within normal limits  BLOOD GAS, ARTERIAL - Abnormal; Notable for the following:    pO2, Arterial 67.7 (*)    Allens test (pass/fail) NOT INDICATED (*)    All other components within normal limits  CBG MONITORING, ED - Abnormal; Notable for the following:    Glucose-Capillary 239 (*)    All other components within normal limits  I-STAT CG4 LACTIC ACID, ED - Abnormal; Notable for the following:    Lactic Acid, Venous 3.76 (*)    All other components within normal limits    EKG  EKG Interpretation  Date/Time:  Thursday July 11 2017 12:57:08 EDT Ventricular Rate:  81 PR Interval:    QRS Duration: 89 QT Interval:  382 QTC Calculation: 444 R Axis:   30 Text Interpretation:  Sinus rhythm Abnormal R-wave progression, early transition No significant change since last tracing Confirmed by Jacalyn Lefevre [phone]) on 07/11/2017 12:59:24 PM       Radiology Dg Chest 2 View  Result Date: 07/11/2017 CLINICAL DATA:  Altered mental status over the past week. Hypoxemia. EXAM: CHEST  2 VIEW COMPARISON:  PA and lateral chest 06/29/2017.  CT chest 06/25/2017. FINDINGS: Asymmetric elevation of the left hemidiaphragm is unchanged. The lungs are emphysematous but clear. Aortic atherosclerosis is noted. Heart size is mildly enlarged. IMPRESSION: No acute disease. COPD.  Atherosclerosis. Electronically Signed   By: Drusilla Kanner M.D.   On: 07/11/2017 13:04   Ct Head Wo Contrast  Result Date: 07/11/2017 CLINICAL DATA:  Larey Seat today.  Altered mental status. EXAM: CT HEAD  WITHOUT CONTRAST TECHNIQUE: Contiguous axial images were obtained from the base of the skull through the vertex without intravenous contrast. COMPARISON:  Multiple prior head CTs. FINDINGS: Brain: Stable age related cerebral atrophy, ventriculomegaly and periventricular white matter disease. No extra-axial fluid collections are identified. No CT findings for acute hemispheric infarction or intracranial hemorrhage. Remote lacunar type basal ganglia infarcts again noted. No mass lesions. The brainstem and cerebellum are normal. Vascular: Stable advanced vascular calcifications but no hyperdense vessels or aneurysm. Skull: No skull fracture or bone lesion. Sinuses/Orbits: The paranasal sinuses and mastoid air cells are clear. The globes are intact. Other: No scalp lesion or hematoma. IMPRESSION: Chronic changes in the brain but no acute findings. No skull fracture.  The Electronically Signed   By: Rudie Meyer M.D.   On: 07/11/2017 13:01    Procedures Procedures (including critical care time)  Medications Ordered in ED Medications  sodium chloride 0.9 % bolus 1,000 mL (0 mLs Intravenous Stopped 07/11/17 1237)    And  0.9 %  sodium chloride infusion ( Intravenous New Bag/Given 07/11/17 1307)  sodium chloride 0.9 % bolus 1,000 mL (1,000 mLs Intravenous New Bag/Given 07/11/17 1307)  ondansetron (ZOFRAN) injection 4 mg (4 mg Intravenous Given 07/11/17 1200)  morphine 4 MG/ML injection 4 mg (4 mg Intravenous Given 07/11/17 1200)     Initial Impression / Assessment and Plan / ED Course  I have reviewed the triage vital signs and the nursing notes.  Pertinent labs & imaging results that were available during my care of the patient were reviewed by me and considered in my medical decision making (see chart for details).    CRITICAL CARE Performed by: Jacalyn Lefevre   Total critical care time: 30 minutes  Critical care time was exclusive of separately billable procedures and treating other  patients.  Critical care was necessary to treat or prevent imminent or life-threatening deterioration.  Critical care was time spent personally by me on the following activities: development of treatment plan with patient and/or surrogate as well as nursing, discussions with consultants, evaluation of patient's response to treatment, examination of patient, obtaining history from patient or surrogate, ordering and performing treatments and interventions, ordering and review of laboratory studies, ordering and review of radiographic studies, pulse oximetry and re-evaluation of patient's condition.   Pt is more alert when her bp is higher.  She is actively dying.  I spoke to her about this as well as to her son and daughter in law.   Family willing to speak to hospice, so Tasha the NP for hospice did come talk with pt and with family.  Family and pt decided to decline hospice again even though it was stressed that pt does not have much time left.  Pt said she wanted to die at home and does not want people in her house.  I stressed again to the son that pt is dying.  I told the son and the daughter in law that pt likely only has a few days left to live.  Final Clinical Impressions(s) / ED Diagnoses   Final diagnoses:  Leaking abdominal aortic aneurysm (AAA) (HCC)  Acute post-hemorrhagic anemia  Hypotension, unspecified hypotension type    New Prescriptions New Prescriptions   No medications on file  Jacalyn Lefevre, MD 07/11/17 1455

## 2017-07-12 DIAGNOSIS — I5022 Chronic systolic (congestive) heart failure: Secondary | ICD-10-CM | POA: Diagnosis not present

## 2017-07-12 DIAGNOSIS — J449 Chronic obstructive pulmonary disease, unspecified: Secondary | ICD-10-CM | POA: Diagnosis not present

## 2017-07-17 ENCOUNTER — Emergency Department (HOSPITAL_COMMUNITY)
Admission: EM | Admit: 2017-07-17 | Discharge: 2017-07-17 | Disposition: A | Discharge status: Home / Self Care | Payer: Medicare HMO | Attending: Emergency Medicine | Admitting: Emergency Medicine

## 2017-07-17 ENCOUNTER — Encounter (HOSPITAL_COMMUNITY): Payer: Self-pay | Admitting: Emergency Medicine

## 2017-07-17 ENCOUNTER — Emergency Department (HOSPITAL_COMMUNITY): Payer: Medicare HMO

## 2017-07-17 DIAGNOSIS — N183 Chronic kidney disease, stage 3 (moderate): Secondary | ICD-10-CM | POA: Diagnosis not present

## 2017-07-17 DIAGNOSIS — I1310 Hypertensive heart and chronic kidney disease without heart failure, with stage 1 through stage 4 chronic kidney disease, or unspecified chronic kidney disease: Secondary | ICD-10-CM | POA: Diagnosis not present

## 2017-07-17 DIAGNOSIS — Z87891 Personal history of nicotine dependence: Secondary | ICD-10-CM | POA: Diagnosis not present

## 2017-07-17 DIAGNOSIS — I5032 Chronic diastolic (congestive) heart failure: Secondary | ICD-10-CM | POA: Insufficient documentation

## 2017-07-17 DIAGNOSIS — Z7982 Long term (current) use of aspirin: Secondary | ICD-10-CM | POA: Insufficient documentation

## 2017-07-17 DIAGNOSIS — I714 Abdominal aortic aneurysm, without rupture, unspecified: Secondary | ICD-10-CM

## 2017-07-17 DIAGNOSIS — R279 Unspecified lack of coordination: Secondary | ICD-10-CM | POA: Diagnosis not present

## 2017-07-17 DIAGNOSIS — Z79899 Other long term (current) drug therapy: Secondary | ICD-10-CM | POA: Insufficient documentation

## 2017-07-17 DIAGNOSIS — Z743 Need for continuous supervision: Secondary | ICD-10-CM | POA: Diagnosis not present

## 2017-07-17 DIAGNOSIS — R1032 Left lower quadrant pain: Secondary | ICD-10-CM | POA: Diagnosis not present

## 2017-07-17 DIAGNOSIS — D649 Anemia, unspecified: Secondary | ICD-10-CM | POA: Diagnosis not present

## 2017-07-17 DIAGNOSIS — R061 Stridor: Secondary | ICD-10-CM | POA: Diagnosis not present

## 2017-07-17 DIAGNOSIS — J449 Chronic obstructive pulmonary disease, unspecified: Secondary | ICD-10-CM | POA: Diagnosis not present

## 2017-07-17 DIAGNOSIS — J4541 Moderate persistent asthma with (acute) exacerbation: Secondary | ICD-10-CM | POA: Diagnosis not present

## 2017-07-17 DIAGNOSIS — M545 Low back pain: Secondary | ICD-10-CM | POA: Diagnosis not present

## 2017-07-17 LAB — URINALYSIS, ROUTINE W REFLEX MICROSCOPIC
Bilirubin Urine: NEGATIVE
GLUCOSE, UA: NEGATIVE mg/dL
Ketones, ur: NEGATIVE mg/dL
Nitrite: NEGATIVE
Protein, ur: NEGATIVE mg/dL
Specific Gravity, Urine: 1.008 (ref 1.005–1.030)
pH: 6 (ref 5.0–8.0)

## 2017-07-17 LAB — CBC WITH DIFFERENTIAL/PLATELET
BASOS ABS: 0 10*3/uL (ref 0.0–0.1)
Basophils Relative: 0 %
EOS PCT: 1 %
Eosinophils Absolute: 0.1 10*3/uL (ref 0.0–0.7)
HCT: 16.3 % — ABNORMAL LOW (ref 36.0–46.0)
Hemoglobin: 4.9 g/dL — CL (ref 12.0–15.0)
LYMPHS ABS: 0.9 10*3/uL (ref 0.7–4.0)
LYMPHS PCT: 10 %
MCH: 29.7 pg (ref 26.0–34.0)
MCHC: 30.1 g/dL (ref 30.0–36.0)
MCV: 98.8 fL (ref 78.0–100.0)
MONO ABS: 0.8 10*3/uL (ref 0.1–1.0)
Monocytes Relative: 10 %
Neutro Abs: 6.9 10*3/uL (ref 1.7–7.7)
Neutrophils Relative %: 79 %
PLATELETS: 402 10*3/uL — AB (ref 150–400)
RBC: 1.65 MIL/uL — ABNORMAL LOW (ref 3.87–5.11)
RDW: 15.9 % — AB (ref 11.5–15.5)
WBC: 8.7 10*3/uL (ref 4.0–10.5)

## 2017-07-17 LAB — BASIC METABOLIC PANEL
Anion gap: 9 (ref 5–15)
BUN: 28 mg/dL — AB (ref 6–20)
CALCIUM: 8.4 mg/dL — AB (ref 8.9–10.3)
CO2: 26 mmol/L (ref 22–32)
Chloride: 103 mmol/L (ref 101–111)
Creatinine, Ser: 1.08 mg/dL — ABNORMAL HIGH (ref 0.44–1.00)
GFR calc Af Amer: 50 mL/min — ABNORMAL LOW (ref >60)
GFR, EST NON AFRICAN AMERICAN: 44 mL/min — AB (ref >60)
GLUCOSE: 100 mg/dL — AB (ref 65–99)
Potassium: 3.2 mmol/L — ABNORMAL LOW (ref 3.5–5.1)
Sodium: 138 mmol/L (ref 135–145)

## 2017-07-17 LAB — POC OCCULT BLOOD, ED: Fecal Occult Bld: NEGATIVE

## 2017-07-17 MED ORDER — PREDNISONE 10 MG PO TABS: 10.0000 mg | ORAL_TABLET | Freq: Every day | ORAL | 0 refills | Status: DC

## 2017-07-17 MED ORDER — DOXYCYCLINE HYCLATE 100 MG PO CAPS: 100.0000 mg | ORAL_CAPSULE | Freq: Two times a day (BID) | ORAL | 0 refills | Status: DC

## 2017-07-17 MED ORDER — SODIUM CHLORIDE 0.9 % IV SOLN: 10.0000 mL/h | Freq: Once | INTRAVENOUS | Status: DC

## 2017-07-17 MED ORDER — SODIUM CHLORIDE 0.9 % IV BOLUS (SEPSIS)
500.0000 mL | Freq: Once | INTRAVENOUS | Status: AC
  Administered 2017-07-17: 500 mL via INTRAVENOUS

## 2017-07-17 NOTE — Discharge Instructions (Addendum)
Strongly recommend hospice involvement for comfort care. Increase fluids. Take your pain medicine whenever you need it

## 2017-07-17 NOTE — ED Triage Notes (Signed)
Pt sent by family d/t pain medication not working for her back and abd pain. Known hx of inoperable AAA. Bruising noted to L flank.

## 2017-07-17 NOTE — ED Notes (Signed)
Patient stated she would now do the xrays

## 2017-07-17 NOTE — ED Provider Notes (Signed)
[facility]-EMERGENCY DEPT Provider Note   CSN: [id number] Arrival date & time: 07/17/17  1235     History   Chief Complaint Chief Complaint  Patient presents with  . Back Pain    HPI Melanie Lowe is a 83 y.o. female.  Level V caveat for mild dementia and urgent need for intervention. Patient has multiple health problems including an inoperable abdominal aneurysm. She is living at home with her son and daughter-in-law. I just spoke with the daughter-in-law at [phone] and she stated that the patient was having issues with pain management. She also states that no one has discussed the issue of anemia with the family.  Her hemoglobin has dropped from 11.8 (11 days ago) to 4.9 today.  Patient has DO NOT RESUSCITATE orders.        Past Medical History:  Diagnosis Date  . AAA (abdominal aortic aneurysm) (HCC)   . Anxiety   . Arthritis   . CHF (congestive heart failure) (HCC) 11/09/2015  . COPD (chronic obstructive pulmonary disease) (HCC)   . Depression   . Hypertension   . Leg swelling   . Myocardial infarction (HCC)   . Neuropathy   . Oxygen dependent   . Pneumonia   . Stroke [facility])     Patient Active Problem List   Diagnosis Date Noted  . End of life care   . Leaking abdominal aortic aneurysm (AAA) (HCC)   . Goals of care, counseling/discussion   . Encounter for hospice care discussion   . 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 AAA (abdominal aortic aneurysm, ruptured) (HCC)   . Shortness of breath 06/25/2017  . Chronic diastolic CHF (congestive heart failure) (HCC) 06/25/2017  . Chronic pain syndrome 06/25/2017  . Elevated brain natriuretic peptide (BNP) level   . CKD (chronic kidney disease) stage 3, GFR 30-59 ml/min 09/02/2016  . Diastolic dysfunction 09/02/2016  . Malnutrition of moderate degree 04/13/2016  . COPD exacerbation (HCC) 04/11/2016  . Anxiety 04/11/2016  . PNA (pneumonia) 04/11/2016  . Acute on chronic diastolic heart failure (HCC)  59/45/8592  . CHF (congestive heart failure) (HCC) 11/09/2015  . Acute on chronic respiratory failure (HCC) 11/05/2015  . Palliative care encounter   . DNR (do not resuscitate)   . Diverticulitis 11/02/2015  . Acute diverticulitis 11/02/2015  . AKI (acute kidney injury) (HCC) 11/02/2015  . Anemia 11/02/2015  . RLL pneumonia (HCC) 11/02/2015  . Pneumonia 04/12/2015  . Failure to thrive in adult 11/11/2014  . Acute encephalopathy 11/11/2014  . Generalized weakness 11/11/2014  . Hypokalemia 11/11/2014  . Bradycardia 11/11/2014  . Dehydration 01/26/2014  . Orthostatic dizziness 01/24/2014  . SOB (shortness of breath) 01/24/2014  . Chronic respiratory failure with hypoxia (HCC) 01/24/2014  . Cough with hemoptysis 01/24/2014  . COPD (chronic obstructive pulmonary disease) (HCC) 01/24/2014  . Acute renal failure (HCC) 01/24/2014  . Dizzy 01/24/2014  . Acute-on-chronic respiratory failure (HCC) 10/12/2012  . Chest pain 10/10/2012  . CAP (community acquired pneumonia) 10/10/2012  . Aortic aneurysm (HCC) 04/16/2012  . Chest pain 08/01/2011  . HYPERCALCEMIA 06/16/2008  . ALLERGIC RHINITIS 12/05/2007  . WEIGHT GAIN 11/07/2007  . PAIN IN JOINT, LOWER LEG 10/09/2007  . TOBACCO ABUSE 09/29/2007  . Abdominal aortic aneurysm (HCC) 09/29/2007  . Sciatica 09/24/2007  . BACK PAIN 09/24/2007  . Anxiety disorder 11/28/2006  . DEPRESSION 11/28/2006  . CATARACT NOS 11/28/2006  . Essential hypertension 11/28/2006  . MYOCARDIAL INFARCTION, HX OF 11/28/2006  . COPD 11/28/2006  .  OVERACTIVE BLADDER 11/28/2006  . UTI'S, RECURRENT 11/28/2006  . LOW BACK PAIN 11/28/2006  . URINARY INCONTINENCE 11/28/2006    Past Surgical History:  Procedure Laterality Date  . APPENDECTOMY    . CHOLECYSTECTOMY      OB History    Gravida Para Term Preterm AB Living   5 5 5          SAB TAB Ectopic Multiple Live Births                   Home Medications    Prior to Admission medications   Medication  Sig Start Date End Date Taking? Authorizing Provider  acetaminophen (TYLENOL) 500 MG tablet Take 250-500 mg by mouth every 6 (six) hours as needed for mild pain, moderate pain, fever or headache.    Yes [provider]  albuterol (PROVENTIL) (2.5 MG/3ML) 0.083% nebulizer solution Take 2.5 mg by nebulization every 6 (six) hours as needed for shortness of breath (uses it regularly).    Yes [provider]  ALPRAZolam Prudy Feeler) 1 MG tablet Take 1 mg by mouth 2 (two) times daily as needed.    Yes [provider]  aspirin EC 81 MG tablet Take 81 mg by mouth daily.   Yes [provider]  budesonide-formoterol (SYMBICORT) 160-4.5 MCG/ACT inhaler Inhale 1 puff into the lungs 2 (two) times daily.    Yes [provider]  citalopram (CELEXA) 20 MG tablet Take 20 mg by mouth at bedtime.    Yes [provider]  diphenhydrAMINE (BENADRYL) 25 MG tablet Take 25 mg by mouth every 6 (six) hours as needed for allergies.   Yes [provider]  gabapentin (NEURONTIN) 100 MG capsule Take 100 mg by mouth 5 (five) times daily.    Yes [provider]  isosorbide mononitrate (IMDUR) 30 MG 24 hr tablet Take 1 tablet by mouth daily. 07/15/17  Yes [provider]  Oxycodone HCl 10 MG TABS Take 1 tablet by mouth every 4 (four) hours as needed for pain. 07/11/17  Yes [provider]  tiotropium (SPIRIVA) 18 MCG inhalation capsule Place 18 mcg into inhaler and inhale at bedtime.    Yes [provider]  torsemide (DEMADEX) 20 MG tablet Take 1 tablet by mouth daily. 07/11/17  Yes [provider]  doxycycline (VIBRAMYCIN) 100 MG capsule Take 1 capsule (100 mg total) by mouth 2 (two) times daily. 07/17/17   Donnetta Hutching, MD  morphine (MS CONTIN) 15 MG 12 hr tablet Take 1 tablet (15 mg total) by mouth every 12 (twelve) hours. Patient not taking: Reported on 07/17/2017 07/10/17   Raeford Razor, MD  predniSONE (DELTASONE) 10 MG tablet Take  1 tablet (10 mg total) by mouth daily with breakfast. 4 tablets for 3 days, 3 tablets for 3 days, 2 tablets for 3 days, one tablet for 3 days 07/17/17   Donnetta Hutching, MD  traMADol (ULTRAM) 50 MG tablet Take 1 tablet (50 mg total) by mouth every 6 (six) hours as needed. Patient not taking: Reported on 07/17/2017 02/10/17   [name], MD    Family History Family History  Problem Relation Age of Onset  . Diabetes Brother   . Cancer Brother        Throat cancer, Heart attack  . Heart disease Brother   . Heart attack Brother   . Cancer Father   . Cancer Sister     Social History Social History  Substance Use Topics  . Smoking status: Former Smoker  Years: 50.00    Types: Cigarettes    Quit date: 12/31/2010  . Smokeless tobacco: Former Neurosurgeon    Quit date: 12/31/2010  . Alcohol use No     Allergies   Ciprofloxacin hcl; Nsaids; Other; Sulfa antibiotics; Sulfur; and Tomato   Review of Systems Review of Systems  Reason unable to perform ROS: urgent need for intervention.     Physical Exam Updated Vital Signs BP 102/63 (BP Location: Right Arm)   Pulse 76   Temp 97.8 F (36.6 C) (Oral)   Resp 16   Ht 5\' 1"  (1.549 m)   Wt 60.8 kg (134 lb)   SpO2 92%   BMI 25.32 kg/m   Physical Exam  Constitutional: She is oriented to person, place, and time.  Pale, alert  HENT:  Head: Normocephalic and atraumatic.  Eyes: Conjunctivae are normal.  Neck: Neck supple.  Cardiovascular: Normal rate and regular rhythm.   Pulmonary/Chest: Effort normal and breath sounds normal.  Abdominal: Soft. Bowel sounds are normal.  Genitourinary:  Genitourinary Comments: No masses, brown stool, heme neg  Musculoskeletal: Normal range of motion.  Neurological: She is alert and oriented to person, place, and time.  Skin: Skin is warm and dry.  Psychiatric: She has a normal mood and affect. Her behavior is normal.  Nursing note and vitals reviewed.    ED Treatments / Results  Labs (all labs  ordered are listed, but only abnormal results are displayed) Labs Reviewed  CBC WITH DIFFERENTIAL/PLATELET - Abnormal; Notable for the following:       Result Value   RBC 1.65 (*)    Hemoglobin 4.9 (*)    HCT 16.3 (*)    RDW 15.9 (*)    Platelets 402 (*)    All other components within normal limits  BASIC METABOLIC PANEL - Abnormal; Notable for the following:    Potassium 3.2 (*)    Glucose, Bld 100 (*)    BUN 28 (*)    Creatinine, Ser 1.08 (*)    Calcium 8.4 (*)    GFR calc non Af Amer 44 (*)    GFR calc Af Amer 50 (*)    All other components within normal limits  URINALYSIS, ROUTINE W REFLEX MICROSCOPIC  POC OCCULT BLOOD, ED  PREPARE RBC (CROSSMATCH)    EKG  EKG Interpretation  Date/Time:  Wednesday July 17 2017 12:44:00 EDT Ventricular Rate:  82 PR Interval:    QRS Duration: 81 QT Interval:  435 QTC Calculation: 509 R Axis:   25 Text Interpretation:  Sinus rhythm Atrial premature complex Nonspecific T abnormalities, diffuse leads Prolonged QT interval Baseline wander in lead(s) V1 V3 Confirmed by Donnetta Hutching ([id number]) on 07/17/2017 2:11:07 PM       Radiology Dg Lumbar Spine Complete  Result Date: 07/17/2017 CLINICAL DATA:  Lumbago EXAM: LUMBAR SPINE - COMPLETE 4+ VIEW COMPARISON:  June 21, 2015 FINDINGS: Frontal, lateral, spot lumbosacral lateral, and bilateral oblique views were obtained. There are 5 non-rib-bearing lumbar type vertebral bodies. There is no appreciable fracture. There is 5 mm of anterolisthesis of L3 on L4. There is 2 minimal anterolisthesis of L4 on L5. There is no new spondylolisthesis. There is disc space narrowing at L1-2, L2-3, L3-4, and L4-5 with disc degeneration at L1-2, increased from prior study. There is facet osteoarthritic change at L3-4, L4-5, an L5-S1. There is extensive atherosclerotic calcification in the aorta and iliac arteries with evidence of abdominal aortic aneurysm, similar prior study. IMPRESSION: Multilevel osteoarthritic change.  Areas  of mild spondylolisthesis at L3-4 and L4-5, stable. No fracture. There is aortoiliac atherosclerosis with abdominal aortic aneurysm, noted previously. Aortic aneurysm NOS (ICD10-I71.9). Electronically Signed   By: Bretta Bang III M.D.   On: 07/17/2017 14:29    Procedures Procedures (including critical care time)  Medications Ordered in ED Medications  0.9 %  sodium chloride infusion (not administered)  sodium chloride 0.9 % bolus 500 mL (500 mLs Intravenous New Bag/Given 07/17/17 1309)     Initial Impression / Assessment and Plan / ED Course  I have reviewed the triage vital signs and the nursing notes.  Pertinent labs & imaging results that were available during my care of the patient were reviewed by me and considered in my medical decision making (see chart for details).   I spoke with the son and daughter-in-law at the home number provided [phone].  They understand the terminal nature of her illness.  I encouraged hospice care. Patient will return home.     Final Clinical Impressions(s) / ED Diagnoses   Final diagnoses:  Moderate asthma with exacerbation, unspecified whether persistent    New Prescriptions New Prescriptions   DOXYCYCLINE (VIBRAMYCIN) 100 MG CAPSULE    Take 1 capsule (100 mg total) by mouth 2 (two) times daily.   PREDNISONE (DELTASONE) 10 MG TABLET    Take 1 tablet (10 mg total) by mouth daily with breakfast. 4 tablets for 3 days, 3 tablets for 3 days, 2 tablets for 3 days, one tablet for 3 days     Donnetta Hutching, MD 07/17/17 1553

## 2017-07-17 NOTE — ED Notes (Signed)
This nurse present during rectal exam for hemoccult

## 2017-07-17 NOTE — ED Notes (Addendum)
Patient refused x-ray.  Patient stated she was thirsty, hungry and just wanted to go home.  Notified nurse.

## 2017-07-22 ENCOUNTER — Other Ambulatory Visit: Payer: Self-pay

## 2017-07-22 NOTE — Patient Outreach (Signed)
[facility]) Care Management  07/22/2017  Melanie Lowe 1924-02-21 [id number]   Transition of Care Referral  Referral Date: 06/28/17 Referral Source: [employer] Discharge Report Date of Admission: 06/25/17 Diagnosis: chest pain Date of Discharge: 06/27/17 Facility: [facility] Insurance: [facility]   Multiple attempts to establish contact with patient without success. No response from letter mailed to patient. Case is being closed at this time.    Plan: RN CM will notify [facility] administrative assistant of case closure status. RN CM will send MD case closure letter.   Antionette Fairy, RN,BSN,CCM [facility] Care Management Telephonic Care Management Coordinator Direct Phone: [phone] Toll Free: [phone] Fax: [phone]

## 2017-08-10 ENCOUNTER — Encounter (HOSPITAL_COMMUNITY): Payer: Self-pay | Admitting: Emergency Medicine

## 2017-08-10 ENCOUNTER — Inpatient Hospital Stay (HOSPITAL_COMMUNITY)
Admission: EM | Admit: 2017-08-10 | Discharge: 2017-08-15 | DRG: 871 | Disposition: A | Discharge status: Hospice | Payer: Medicare HMO | Attending: Pulmonary Disease | Admitting: Pulmonary Disease

## 2017-08-10 DIAGNOSIS — Z515 Encounter for palliative care: Secondary | ICD-10-CM | POA: Diagnosis present

## 2017-08-10 DIAGNOSIS — Z882 Allergy status to sulfonamides status: Secondary | ICD-10-CM

## 2017-08-10 DIAGNOSIS — I714 Abdominal aortic aneurysm, without rupture, unspecified: Secondary | ICD-10-CM | POA: Diagnosis present

## 2017-08-10 DIAGNOSIS — Z8249 Family history of ischemic heart disease and other diseases of the circulatory system: Secondary | ICD-10-CM

## 2017-08-10 DIAGNOSIS — I744 Embolism and thrombosis of arteries of extremities, unspecified: Secondary | ICD-10-CM | POA: Diagnosis not present

## 2017-08-10 DIAGNOSIS — L0390 Cellulitis, unspecified: Secondary | ICD-10-CM | POA: Diagnosis not present

## 2017-08-10 DIAGNOSIS — I130 Hypertensive heart and chronic kidney disease with heart failure and stage 1 through stage 4 chronic kidney disease, or unspecified chronic kidney disease: Secondary | ICD-10-CM | POA: Diagnosis present

## 2017-08-10 DIAGNOSIS — J449 Chronic obstructive pulmonary disease, unspecified: Secondary | ICD-10-CM | POA: Diagnosis present

## 2017-08-10 DIAGNOSIS — I5032 Chronic diastolic (congestive) heart failure: Secondary | ICD-10-CM | POA: Diagnosis not present

## 2017-08-10 DIAGNOSIS — Z9981 Dependence on supplemental oxygen: Secondary | ICD-10-CM

## 2017-08-10 DIAGNOSIS — R627 Adult failure to thrive: Secondary | ICD-10-CM | POA: Diagnosis present

## 2017-08-10 DIAGNOSIS — Z79899 Other long term (current) drug therapy: Secondary | ICD-10-CM | POA: Diagnosis not present

## 2017-08-10 DIAGNOSIS — I252 Old myocardial infarction: Secondary | ICD-10-CM

## 2017-08-10 DIAGNOSIS — Z7982 Long term (current) use of aspirin: Secondary | ICD-10-CM

## 2017-08-10 DIAGNOSIS — Z833 Family history of diabetes mellitus: Secondary | ICD-10-CM | POA: Diagnosis not present

## 2017-08-10 DIAGNOSIS — I959 Hypotension, unspecified: Secondary | ICD-10-CM

## 2017-08-10 DIAGNOSIS — Z8673 Personal history of transient ischemic attack (TIA), and cerebral infarction without residual deficits: Secondary | ICD-10-CM

## 2017-08-10 DIAGNOSIS — I713 Abdominal aortic aneurysm, ruptured, unspecified: Secondary | ICD-10-CM | POA: Diagnosis present

## 2017-08-10 DIAGNOSIS — N183 Chronic kidney disease, stage 3 unspecified: Secondary | ICD-10-CM | POA: Diagnosis present

## 2017-08-10 DIAGNOSIS — A419 Sepsis, unspecified organism: Principal | ICD-10-CM | POA: Diagnosis present

## 2017-08-10 DIAGNOSIS — Z66 Do not resuscitate: Secondary | ICD-10-CM | POA: Diagnosis not present

## 2017-08-10 DIAGNOSIS — R0902 Hypoxemia: Secondary | ICD-10-CM | POA: Diagnosis not present

## 2017-08-10 DIAGNOSIS — L8990 Pressure ulcer of unspecified site, unspecified stage: Secondary | ICD-10-CM | POA: Diagnosis present

## 2017-08-10 DIAGNOSIS — R509 Fever, unspecified: Secondary | ICD-10-CM | POA: Diagnosis not present

## 2017-08-10 DIAGNOSIS — I5022 Chronic systolic (congestive) heart failure: Secondary | ICD-10-CM | POA: Diagnosis not present

## 2017-08-10 DIAGNOSIS — Z9049 Acquired absence of other specified parts of digestive tract: Secondary | ICD-10-CM

## 2017-08-10 DIAGNOSIS — L89159 Pressure ulcer of sacral region, unspecified stage: Secondary | ICD-10-CM

## 2017-08-10 DIAGNOSIS — I718 Aortic aneurysm of unspecified site, ruptured: Secondary | ICD-10-CM | POA: Diagnosis not present

## 2017-08-10 DIAGNOSIS — I517 Cardiomegaly: Secondary | ICD-10-CM | POA: Diagnosis not present

## 2017-08-10 DIAGNOSIS — M1990 Unspecified osteoarthritis, unspecified site: Secondary | ICD-10-CM | POA: Diagnosis present

## 2017-08-10 DIAGNOSIS — Z888 Allergy status to other drugs, medicaments and biological substances status: Secondary | ICD-10-CM | POA: Diagnosis not present

## 2017-08-10 DIAGNOSIS — F419 Anxiety disorder, unspecified: Secondary | ICD-10-CM | POA: Diagnosis not present

## 2017-08-10 DIAGNOSIS — J9611 Chronic respiratory failure with hypoxia: Secondary | ICD-10-CM | POA: Diagnosis not present

## 2017-08-10 DIAGNOSIS — L89153 Pressure ulcer of sacral region, stage 3: Secondary | ICD-10-CM | POA: Diagnosis present

## 2017-08-10 DIAGNOSIS — Z808 Family history of malignant neoplasm of other organs or systems: Secondary | ICD-10-CM

## 2017-08-10 DIAGNOSIS — Z87891 Personal history of nicotine dependence: Secondary | ICD-10-CM

## 2017-08-10 DIAGNOSIS — M5489 Other dorsalgia: Secondary | ICD-10-CM | POA: Diagnosis not present

## 2017-08-10 DIAGNOSIS — G894 Chronic pain syndrome: Secondary | ICD-10-CM | POA: Diagnosis present

## 2017-08-10 DIAGNOSIS — L03317 Cellulitis of buttock: Secondary | ICD-10-CM | POA: Diagnosis not present

## 2017-08-10 NOTE — ED Triage Notes (Signed)
Wound in sacral area with poss infection.  Ems reports wound looks necrotic. pts  bp was 80's sys and temp was 102.  Given 1g rocephin IM and 400cc ns en route to ed.  Family reported to ems this started x 3 days ago.

## 2017-08-11 ENCOUNTER — Emergency Department (HOSPITAL_COMMUNITY): Payer: Medicare HMO

## 2017-08-11 ENCOUNTER — Inpatient Hospital Stay (HOSPITAL_COMMUNITY): Payer: Medicare HMO

## 2017-08-11 ENCOUNTER — Encounter (HOSPITAL_COMMUNITY): Payer: Self-pay

## 2017-08-11 DIAGNOSIS — Z8249 Family history of ischemic heart disease and other diseases of the circulatory system: Secondary | ICD-10-CM | POA: Diagnosis not present

## 2017-08-11 DIAGNOSIS — L0390 Cellulitis, unspecified: Secondary | ICD-10-CM | POA: Diagnosis present

## 2017-08-11 DIAGNOSIS — L03317 Cellulitis of buttock: Secondary | ICD-10-CM | POA: Diagnosis not present

## 2017-08-11 DIAGNOSIS — I718 Aortic aneurysm of unspecified site, ruptured: Secondary | ICD-10-CM | POA: Diagnosis present

## 2017-08-11 DIAGNOSIS — G894 Chronic pain syndrome: Secondary | ICD-10-CM | POA: Diagnosis present

## 2017-08-11 DIAGNOSIS — Z8673 Personal history of transient ischemic attack (TIA), and cerebral infarction without residual deficits: Secondary | ICD-10-CM | POA: Diagnosis not present

## 2017-08-11 DIAGNOSIS — I959 Hypotension, unspecified: Secondary | ICD-10-CM | POA: Diagnosis present

## 2017-08-11 DIAGNOSIS — Z882 Allergy status to sulfonamides status: Secondary | ICD-10-CM | POA: Diagnosis not present

## 2017-08-11 DIAGNOSIS — N183 Chronic kidney disease, stage 3 (moderate): Secondary | ICD-10-CM | POA: Diagnosis present

## 2017-08-11 DIAGNOSIS — J9611 Chronic respiratory failure with hypoxia: Secondary | ICD-10-CM | POA: Diagnosis present

## 2017-08-11 DIAGNOSIS — I252 Old myocardial infarction: Secondary | ICD-10-CM | POA: Diagnosis not present

## 2017-08-11 DIAGNOSIS — I5032 Chronic diastolic (congestive) heart failure: Secondary | ICD-10-CM | POA: Diagnosis present

## 2017-08-11 DIAGNOSIS — R627 Adult failure to thrive: Secondary | ICD-10-CM | POA: Diagnosis present

## 2017-08-11 DIAGNOSIS — L89153 Pressure ulcer of sacral region, stage 3: Secondary | ICD-10-CM | POA: Diagnosis present

## 2017-08-11 DIAGNOSIS — Z833 Family history of diabetes mellitus: Secondary | ICD-10-CM | POA: Diagnosis not present

## 2017-08-11 DIAGNOSIS — Z87891 Personal history of nicotine dependence: Secondary | ICD-10-CM | POA: Diagnosis not present

## 2017-08-11 DIAGNOSIS — Z9049 Acquired absence of other specified parts of digestive tract: Secondary | ICD-10-CM | POA: Diagnosis not present

## 2017-08-11 DIAGNOSIS — A419 Sepsis, unspecified organism: Secondary | ICD-10-CM | POA: Diagnosis present

## 2017-08-11 DIAGNOSIS — I130 Hypertensive heart and chronic kidney disease with heart failure and stage 1 through stage 4 chronic kidney disease, or unspecified chronic kidney disease: Secondary | ICD-10-CM | POA: Diagnosis present

## 2017-08-11 DIAGNOSIS — F419 Anxiety disorder, unspecified: Secondary | ICD-10-CM | POA: Diagnosis not present

## 2017-08-11 DIAGNOSIS — Z66 Do not resuscitate: Secondary | ICD-10-CM | POA: Diagnosis present

## 2017-08-11 DIAGNOSIS — Z7982 Long term (current) use of aspirin: Secondary | ICD-10-CM | POA: Diagnosis not present

## 2017-08-11 DIAGNOSIS — I713 Abdominal aortic aneurysm, ruptured: Secondary | ICD-10-CM

## 2017-08-11 DIAGNOSIS — L8990 Pressure ulcer of unspecified site, unspecified stage: Secondary | ICD-10-CM | POA: Diagnosis present

## 2017-08-11 DIAGNOSIS — Z79899 Other long term (current) drug therapy: Secondary | ICD-10-CM | POA: Diagnosis not present

## 2017-08-11 DIAGNOSIS — M1990 Unspecified osteoarthritis, unspecified site: Secondary | ICD-10-CM | POA: Diagnosis present

## 2017-08-11 DIAGNOSIS — J449 Chronic obstructive pulmonary disease, unspecified: Secondary | ICD-10-CM | POA: Diagnosis present

## 2017-08-11 DIAGNOSIS — Z888 Allergy status to other drugs, medicaments and biological substances status: Secondary | ICD-10-CM | POA: Diagnosis not present

## 2017-08-11 LAB — URINALYSIS, ROUTINE W REFLEX MICROSCOPIC
BILIRUBIN URINE: NEGATIVE
Glucose, UA: NEGATIVE mg/dL
KETONES UR: NEGATIVE mg/dL
Leukocytes, UA: NEGATIVE
NITRITE: NEGATIVE
PH: 5 (ref 5.0–8.0)
PROTEIN: NEGATIVE mg/dL
Specific Gravity, Urine: 1.014 (ref 1.005–1.030)

## 2017-08-11 LAB — COMPREHENSIVE METABOLIC PANEL
ALT: 12 U/L — AB (ref 14–54)
ANION GAP: 9 (ref 5–15)
AST: 18 U/L (ref 15–41)
Albumin: 2.7 g/dL — ABNORMAL LOW (ref 3.5–5.0)
Alkaline Phosphatase: 75 U/L (ref 38–126)
BUN: 25 mg/dL — ABNORMAL HIGH (ref 6–20)
CHLORIDE: 98 mmol/L — AB (ref 101–111)
CO2: 31 mmol/L (ref 22–32)
CREATININE: 1.36 mg/dL — AB (ref 0.44–1.00)
Calcium: 8.9 mg/dL (ref 8.9–10.3)
GFR, EST AFRICAN AMERICAN: 38 mL/min — AB (ref >60)
GFR, EST NON AFRICAN AMERICAN: 33 mL/min — AB (ref >60)
Glucose, Bld: 111 mg/dL — ABNORMAL HIGH (ref 65–99)
POTASSIUM: 3.6 mmol/L (ref 3.5–5.1)
Sodium: 138 mmol/L (ref 135–145)
Total Bilirubin: 0.7 mg/dL (ref 0.3–1.2)
Total Protein: 6.2 g/dL — ABNORMAL LOW (ref 6.5–8.1)

## 2017-08-11 LAB — CBC WITH DIFFERENTIAL/PLATELET
Basophils Absolute: 0 10*3/uL (ref 0.0–0.1)
Basophils Relative: 0 %
EOS ABS: 0.1 10*3/uL (ref 0.0–0.7)
Eosinophils Relative: 2 %
HCT: 25.4 % — ABNORMAL LOW (ref 36.0–46.0)
HEMOGLOBIN: 7.6 g/dL — AB (ref 12.0–15.0)
LYMPHS ABS: 1.4 10*3/uL (ref 0.7–4.0)
Lymphocytes Relative: 23 %
MCH: 28.4 pg (ref 26.0–34.0)
MCHC: 29.9 g/dL — ABNORMAL LOW (ref 30.0–36.0)
MCV: 94.8 fL (ref 78.0–100.0)
Monocytes Absolute: 0.8 10*3/uL (ref 0.1–1.0)
Monocytes Relative: 14 %
NEUTROS ABS: 3.7 10*3/uL (ref 1.7–7.7)
NEUTROS PCT: 61 %
Platelets: 342 10*3/uL (ref 150–400)
RBC: 2.68 MIL/uL — AB (ref 3.87–5.11)
RDW: 16.2 % — ABNORMAL HIGH (ref 11.5–15.5)
WBC: 6 10*3/uL (ref 4.0–10.5)

## 2017-08-11 LAB — CBC
HCT: 22.9 % — ABNORMAL LOW (ref 36.0–46.0)
Hemoglobin: 6.9 g/dL — CL (ref 12.0–15.0)
MCH: 28.9 pg (ref 26.0–34.0)
MCHC: 30.1 g/dL (ref 30.0–36.0)
MCV: 95.8 fL (ref 78.0–100.0)
PLATELETS: 320 10*3/uL (ref 150–400)
RBC: 2.39 MIL/uL — ABNORMAL LOW (ref 3.87–5.11)
RDW: 16.4 % — AB (ref 11.5–15.5)
WBC: 6.1 10*3/uL (ref 4.0–10.5)

## 2017-08-11 LAB — MRSA PCR SCREENING: MRSA by PCR: POSITIVE — AB

## 2017-08-11 LAB — I-STAT CG4 LACTIC ACID, ED
LACTIC ACID, VENOUS: 1.14 mmol/L (ref 0.5–1.9)
Lactic Acid, Venous: 1.28 mmol/L (ref 0.5–1.9)

## 2017-08-11 MED ORDER — ACETAMINOPHEN 650 MG RE SUPP
650.0000 mg | Freq: Four times a day (QID) | RECTAL | Status: DC | PRN
  Administered 2017-08-12: 650 mg via RECTAL
  Filled 2017-08-11: qty 1

## 2017-08-11 MED ORDER — SODIUM CHLORIDE 0.9 % IV BOLUS (SEPSIS)
1000.0000 mL | Freq: Once | INTRAVENOUS | Status: AC
  Administered 2017-08-11: 1000 mL via INTRAVENOUS

## 2017-08-11 MED ORDER — NOREPINEPHRINE BITARTRATE 1 MG/ML IV SOLN
INTRAVENOUS | Status: AC
Start: 2017-08-11 — End: 2017-08-11
  Filled 2017-08-11: qty 4

## 2017-08-11 MED ORDER — ONDANSETRON HCL 4 MG/2ML IJ SOLN: 4.0000 mg | Freq: Four times a day (QID) | INTRAMUSCULAR | Status: DC | PRN

## 2017-08-11 MED ORDER — SODIUM CHLORIDE 0.9 % IV SOLN
1.0000 mg/h | INTRAVENOUS | Status: DC
  Administered 2017-08-11: 1 mg/h via INTRAVENOUS
  Filled 2017-08-11: qty 10

## 2017-08-11 MED ORDER — ONDANSETRON HCL 4 MG PO TABS: 4.0000 mg | ORAL_TABLET | Freq: Four times a day (QID) | ORAL | Status: DC | PRN

## 2017-08-11 MED ORDER — ALBUTEROL SULFATE (2.5 MG/3ML) 0.083% IN NEBU: 2.5000 mg | INHALATION_SOLUTION | Freq: Four times a day (QID) | RESPIRATORY_TRACT | Status: DC | PRN

## 2017-08-11 MED ORDER — SODIUM CHLORIDE 0.9 % IV SOLN
INTRAVENOUS | Status: AC
  Administered 2017-08-11: 13:00:00 via INTRAVENOUS

## 2017-08-11 MED ORDER — PIPERACILLIN-TAZOBACTAM 3.375 G IVPB 30 MIN
3.3750 g | Freq: Once | INTRAVENOUS | Status: AC
  Administered 2017-08-11: 3.375 g via INTRAVENOUS
  Filled 2017-08-11: qty 50

## 2017-08-11 MED ORDER — ACETAMINOPHEN 325 MG PO TABS: 650.0000 mg | ORAL_TABLET | Freq: Four times a day (QID) | ORAL | Status: DC | PRN

## 2017-08-11 MED ORDER — VANCOMYCIN HCL IN DEXTROSE 1-5 GM/200ML-% IV SOLN
1000.0000 mg | Freq: Once | INTRAVENOUS | Status: AC
  Administered 2017-08-11: 1000 mg via INTRAVENOUS
  Filled 2017-08-11: qty 200

## 2017-08-11 MED ORDER — MORPHINE SULFATE 25 MG/ML IV SOLN
INTRAVENOUS | Status: AC
  Filled 2017-08-11: qty 10

## 2017-08-11 MED ORDER — VANCOMYCIN HCL IN DEXTROSE 1-5 GM/200ML-% IV SOLN: 1000.0000 mg | INTRAVENOUS | Status: DC

## 2017-08-11 MED ORDER — ALPRAZOLAM 0.5 MG PO TABS
1.0000 mg | ORAL_TABLET | Freq: Two times a day (BID) | ORAL | Status: DC | PRN
  Administered 2017-08-11: 1 mg via ORAL
  Filled 2017-08-11: qty 2

## 2017-08-11 MED ORDER — PIPERACILLIN-TAZOBACTAM 3.375 G IVPB
3.3750 g | Freq: Three times a day (TID) | INTRAVENOUS | Status: DC
  Administered 2017-08-11 (×2): 3.375 g via INTRAVENOUS
  Filled 2017-08-11 (×2): qty 50

## 2017-08-11 MED ORDER — DEXTROSE 5 % IV SOLN
0.0000 ug/min | INTRAVENOUS | Status: DC
  Administered 2017-08-11: 2 ug/min via INTRAVENOUS
  Filled 2017-08-11: qty 4

## 2017-08-11 MED ORDER — ACETAMINOPHEN 160 MG/5ML PO SOLN
650.0000 mg | Freq: Once | ORAL | Status: AC
  Administered 2017-08-11: 01:00:00 via ORAL
  Filled 2017-08-11: qty 20.3

## 2017-08-11 MED ORDER — TIOTROPIUM BROMIDE MONOHYDRATE 18 MCG IN CAPS
18.0000 ug | ORAL_CAPSULE | Freq: Every day | RESPIRATORY_TRACT | Status: DC
  Administered 2017-08-11: 18 ug via RESPIRATORY_TRACT
  Filled 2017-08-11: qty 5

## 2017-08-11 MED ORDER — OXYCODONE HCL 5 MG PO TABS: 10.0000 mg | ORAL_TABLET | ORAL | Status: DC | PRN

## 2017-08-11 NOTE — Progress Notes (Signed)
Pharmacy Antibiotic Note  Melanie Lowe is a 83 y.o. female admitted on 08/10/2017 with UTI.  Pharmacy has been consulted for [facility] AND ZOSYN dosing.  Plan:  Vancomycin 1000mg  IV q48h (renally adjusted) Check trough at steady state Zosyn 3.375gm IV q8h, EID Monitor labs, renal fxn, progress and c/s Deescalate ABX when improved / appropriate.    Height: 5\' 1"  (154.9 cm) Weight: 124 lb 12.5 oz (56.6 kg) IBW/kg (Calculated) : 47.8  Temp (24hrs), Avg:99.2 F (37.3 C), Min:97.8 F (36.6 C), Max:100.9 F (38.3 C)   Recent Labs Lab 08/11/17 0032 08/11/17 0041 08/11/17 0335 08/11/17 0559  WBC 6.0  --   --  6.1  CREATININE 1.36*  --   --   --   LATICACIDVEN  --  1.14 1.28  --     Estimated Creatinine Clearance: 20.3 mL/min (A) (by C-G formula based on SCr of 1.36 mg/dL (H)).    Allergies  Allergen Reactions  . Ciprofloxacin Hcl Nausea And Vomiting  . Nsaids Other (See Comments)    Advised by MD not to take this class of medication.  Pt is able to take coated Aspirin.    . Other Other (See Comments)    Spices trigger acid reflux.   . Sulfa Antibiotics Nausea And Vomiting  . Sulfur Nausea And Vomiting  . Tomato Other (See Comments)    Reaction:  Acid reflux    Antimicrobials this admission: Vancomycin 8/12 >>  Zosyn 8/12 >>   Dose adjustments this admission:  Microbiology results:  BCx: pending  UCx: pending   Sputum:    MRSA PCR:   Thank you for allowing pharmacy to be a part of this patient's care.  Valrie Hart A 08/11/2017 9:08 AM

## 2017-08-11 NOTE — H&P (Signed)
History and Physical    Melanie Lowe GLO:[id number] DOB: 06/08/1924 DOA: 08/10/2017  PCP: Kari Baars, MD  Patient coming from:  home  Chief Complaint:   Wound to buttock  HPI: AMIYHA Lowe is a 83 y.o. female with medical history significant of recent leaking abdominal aortic aneursym a month ago, COPD, CHF, CRF, anxiety, prev stroke was d/c about a month ago after being hospitalized with a leaking aneurysm.  Melanie Lowe survived that event and was offered home health or hospice which she did not want.  Melanie Lowe has issues with strangers.  Her family has been taking care of her at home, but she has not been getting up and moving around a whole lot since going home.  She has developed a sacral wound and family was concerned about this because it was getting red looking.  They deny and she denies any fevers.  She denies any pain. She is having no abdominal pain.  No n/v/d.  She denies any sob.  No urinary symptoms.  On arrival her bp was fine, sepsis protocol was followed in the ED, and after 2 liters ivf bolus her bp started to drop low sbp in the 70s.  Melanie Lowe is DNR but family wishes for her infection to be treated and to be placed on levophed.  Melanie Lowe referred for admission for sepsis likely due to her new ulcer.  Review of Systems: As per HPI otherwise 10 point review of systems negative.   Past Medical History:  Diagnosis Date  . AAA (abdominal aortic aneurysm) (HCC)   . Anxiety   . Arthritis   . CHF (congestive heart failure) (HCC) 11/09/2015  . COPD (chronic obstructive pulmonary disease) (HCC)   . Depression   . Hypertension   . Leg swelling   . Myocardial infarction (HCC)   . Neuropathy   . Oxygen dependent   . Pneumonia   . Stroke [facility])     Past Surgical History:  Procedure Laterality Date  . APPENDECTOMY    . CHOLECYSTECTOMY       reports that she quit smoking about 6 years ago. Her smoking use included Cigarettes. She quit after 50.00 years of use. She quit smokeless tobacco use about 6  years ago. She reports that she does not drink alcohol or use drugs.  Allergies  Allergen Reactions  . Ciprofloxacin Hcl Nausea And Vomiting  . Nsaids Other (See Comments)    Advised by MD not to take this class of medication.  Melanie Lowe is able to take coated Aspirin.    . Other Other (See Comments)    Spices trigger acid reflux.   . Sulfa Antibiotics Nausea And Vomiting  . Sulfur Nausea And Vomiting  . Tomato Other (See Comments)    Reaction:  Acid reflux    Family History  Problem Relation Age of Onset  . Diabetes Brother   . Cancer Brother        Throat cancer, Heart attack  . Heart disease Brother   . Heart attack Brother   . Cancer Father   . Cancer Sister     Prior to Admission medications   Medication Sig Start Date End Date Taking? Authorizing Provider  acetaminophen (TYLENOL) 500 MG tablet Take 250-500 mg by mouth every 6 (six) hours as needed for mild pain, moderate pain, fever or headache.     [provider]  albuterol (PROVENTIL) (2.5 MG/3ML) 0.083% nebulizer solution Take 2.5 mg by nebulization every 6 (six) hours as needed for shortness  of breath (uses it regularly).     [provider]  ALPRAZolam Prudy Feeler) 1 MG tablet Take 1 mg by mouth 2 (two) times daily as needed.     [provider]  aspirin EC 81 MG tablet Take 81 mg by mouth daily.    [provider]  budesonide-formoterol (SYMBICORT) 160-4.5 MCG/ACT inhaler Inhale 1 puff into the lungs 2 (two) times daily.     [provider]  citalopram (CELEXA) 20 MG tablet Take 20 mg by mouth at bedtime.     [provider]  diphenhydrAMINE (BENADRYL) 25 MG tablet Take 25 mg by mouth every 6 (six) hours as needed for allergies.    [provider]  gabapentin (NEURONTIN) 100 MG capsule Take 100 mg by mouth 5 (five) times daily.     [provider]  isosorbide mononitrate (IMDUR) 30 MG 24 hr tablet Take 1 tablet by mouth daily. 07/15/17   [provider]  morphine (MS CONTIN) 15 MG 12 hr tablet Take 1 tablet (15 mg total) by mouth every 12 (twelve) hours. Patient not taking: Reported on 07/17/2017 07/10/17   Raeford Razor, MD  Oxycodone HCl 10 MG TABS Take 1 tablet by mouth every 4 (four) hours as needed for pain. 07/11/17   [provider]  tiotropium (SPIRIVA) 18 MCG inhalation capsule Place 18 mcg into inhaler and inhale at bedtime.     [provider]  torsemide (DEMADEX) 20 MG tablet Take 1 tablet by mouth daily. 07/11/17   [provider]  traMADol (ULTRAM) 50 MG tablet Take 1 tablet (50 mg total) by mouth every 6 (six) hours as needed. Patient not taking: Reported on 07/17/2017 02/10/17   [name], MD    Physical Exam: Vitals:   08/11/17 0417 08/11/17 0430 08/11/17 0500 08/11/17 0500  BP: (!) 84/70 (!) 82/70 (!) 87/74 (!) 87/74  Pulse: 67   65  Resp: 13 14 11 12   Temp:      TempSrc:      SpO2: 98%   96%  Weight:      Height:        Constitutional: NAD, calm, comfortable Vitals:   08/11/17 0417 08/11/17 0430 08/11/17 0500 08/11/17 0500  BP: (!) 84/70 (!) 82/70 (!) 87/74 (!) 87/74  Pulse: 67   65  Resp: 13 14 11 12   Temp:      TempSrc:      SpO2: 98%   96%  Weight:      Height:       Eyes: PERRL, lids and conjunctivae normal ENMT: Mucous membranes are moist. Posterior pharynx clear of any exudate or lesions.Normal dentition.  Neck: normal, supple, no masses, no thyromegaly Respiratory: clear to auscultation bilaterally, no wheezing, no crackles. Normal respiratory effort. No accessory muscle use.  Cardiovascular: Regular rate and rhythm, no murmurs / rubs / gallops. No extremity edema. 2+ pedal pulses. No carotid bruits.  Abdomen: no tenderness, no masses palpated. No hepatosplenomegaly. Bowel sounds positive.  Musculoskeletal: no clubbing / cyanosis. No joint deformity upper and lower extremities. Good ROM, no contractures. Normal muscle tone.  Skin: stage 2-3 decub ulcer with  surrounding erythema Neurologic: CN 2-12 grossly intact. Sensation intact, DTR normal. Strength 5/5 in all 4.  Psychiatric: Normal judgment and insight. Alert and oriented x 3. Normal mood.    Labs on Admission: I have personally reviewed following labs and imaging studies  CBC:  Recent Labs Lab 08/11/17 0032  WBC 6.0  NEUTROABS 3.7  HGB 7.6*  HCT 25.4*  MCV 94.8  PLT 342   Basic Metabolic Panel:  Recent Labs Lab 08/11/17 0032  NA 138  K 3.6  CL 98*  CO2 31  GLUCOSE 111*  BUN 25*  CREATININE 1.36*  CALCIUM 8.9   GFR: Estimated Creatinine Clearance: 22.5 mL/min (A) (by C-G formula based on SCr of 1.36 mg/dL (H)). Liver Function Tests:  Recent Labs Lab 08/11/17 0032  AST 18  ALT 12*  ALKPHOS 75  BILITOT 0.7  PROT 6.2*  ALBUMIN 2.7*    Radiological Exams on Admission: Dg Chest Port 1 View  Result Date: 08/11/2017 CLINICAL DATA:  83 y/o F; wound in sacral area with possible infection. Fever. EXAM: PORTABLE CHEST 1 VIEW COMPARISON:  07/11/2017 chest radiograph FINDINGS: Stable cardiomegaly given projection and technique. Aortic atherosclerosis with calcification. Emphysema. Interstitial opacities. Stable of left hemidiaphragm with left basilar atelectasis. No pleural effusion or pneumothorax. No acute osseous abnormality is evident. IMPRESSION: Stable cardiomegaly, aortic atherosclerosis, emphysema, elevated left hemidiaphragm, and left basilar atelectasis. Interstitial opacities, probably interstitial edema. Electronically Signed   By: Mitzi Hansen M.D.   On: 08/11/2017 01:03   cxr reviewed mild edema , possible bilateral infiltrates Old chart reviewed Case discussed with dr knapp Case discussed with ED RN  Assessment/Plan 83 yo female with sepsis from cellulitis/ulcer  Principal Problem:   Sepsis (HCC)- likely source skin.  Could be lung.   Has not gotten a ua sample yet however.  Cover with broad spectrum abx until clear with iv vanc and zosyn.   Proceed with levophed drip to maintain map above 60-65.  Melanie Lowe has an overall very poor prognosis, family is aware of this.  MOSE DNR form at bedside.  Panculture.  If deteriorates clinically despite pressor support and abx consider hospice eval inpt.   Of note, Melanie Lowe has anxiety issues related to strangers.  Active Problems:   Cellulitis- as above   Decubitus skin ulcer- as above   Anxiety disorder- cont xanax prn   Abdominal aortic aneurysm (HCC)- this seems to have stopped leaking obviously, however now with her bp low consideration is that it is leaking again.  hgb around 8 which is much better than it was last month (5).  Will repeat cbc now, if much lower consider leaking again.  She denies any pain however.  Think her hypotension is due to infection however, if indeed this is leaking again would mean her prognosis is even poorer.  Not surgical candidate.   Chronic respiratory failure with hypoxia (HCC)- stable at baseline   COPD (chronic obstructive pulmonary disease) (HCC)- stable, no wheezing, cont home breathing tx   DNR (do not resuscitate)- confirmed with family   CKD (chronic kidney disease) stage 3, GFR 30-59 ml/min- stable   Chronic diastolic CHF (congestive heart failure) (HCC)- stable and compensated at this time, holding all diuretics however    Chronic pain syndrome- cont home pain meds prn   Leaking abdominal aortic aneurysm (AAA) (HCC)- noted, as above   Place foley for accurate output measurement while on pressors.   DVT prophylaxis:  scds Code Status:  DNR Family Communication:  Son and dtr in Social worker Disposition Plan:  Per day team Consults called:  none Admission status:  admission  Cc time 35 min  Brandie Lopes A MD [facility]  If 7PM-7AM, please contact night-coverage [email or web address] Password [facility]  08/11/2017, 5:31 AM

## 2017-08-11 NOTE — Progress Notes (Signed)
Ct reviewed and call from Dr. Chestine Spore in radiology. Aneurysm has ruptured. Transition to comfort care and family informed by telephone and have arranged transportation to hospital to see her.

## 2017-08-11 NOTE — ED Notes (Signed)
Pt reminded of need for urine sample. Pt refuses to use bedpan, female urinal, and refuses in and out catheter. Pt states that she will not pee so that the external catheter will catch her urine.

## 2017-08-11 NOTE — Plan of Care (Signed)
Problem: Pain Managment: Goal: General experience of comfort will improve Outcome: Progressing Pt started on morphine infusion to help with pain control. Monitoring pain using faces scale.

## 2017-08-11 NOTE — ED Provider Notes (Signed)
[facility]Y DEPT Provider Note   CSN: [id number] Arrival date & time: 08/10/17  2354   Time seen 12:25 AM  History   Chief Complaint Chief Complaint  Patient presents with  . Fever   Level 5 caveat for altered mental status  HPI Melanie Lowe is a 83 y.o. female.  HPI patient states she doesn't know why she's here. She states "you need to ask my family". She states she feels fine. She denies having any pain. Per EMS family called stating patient started getting sick about 3 days ago. EMS reports her initial blood pressure was in the 80s and her temperature was 102. EMS gave her 1 g of Rocephin IM and 400 mL of normal saline prior to right ED arrival.  PCP Kari Baars, MD  Patient is DO NOT RESUSCITATE  Past Medical History:  Diagnosis Date  . AAA (abdominal aortic aneurysm) (HCC)   . Anxiety   . Arthritis   . CHF (congestive heart failure) (HCC) 11/09/2015  . COPD (chronic obstructive pulmonary disease) (HCC)   . Depression   . Hypertension   . Leg swelling   . Myocardial infarction (HCC)   . Neuropathy   . Oxygen dependent   . Pneumonia   . Stroke [facility])     Patient Active Problem List   Diagnosis Date Noted  . Sepsis (HCC) 08/11/2017  . End of life care   . Leaking abdominal aortic aneurysm (AAA) (HCC)   . Goals of care, counseling/discussion   . Encounter for hospice care discussion   . 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 AAA (abdominal aortic aneurysm, ruptured) (HCC)   . Shortness of breath 06/25/2017  . Chronic diastolic CHF (congestive heart failure) (HCC) 06/25/2017  . Chronic pain syndrome 06/25/2017  . Elevated brain natriuretic peptide (BNP) level   . CKD (chronic kidney disease) stage 3, GFR 30-59 ml/min 09/02/2016  . Diastolic dysfunction 09/02/2016  . Malnutrition of moderate degree 04/13/2016  . COPD exacerbation (HCC) 04/11/2016  . Anxiety 04/11/2016  . PNA (pneumonia) 04/11/2016  . Acute on chronic diastolic heart failure  (HCC) 47/82/9562  . CHF (congestive heart failure) (HCC) 11/09/2015  . Acute on chronic respiratory failure (HCC) 11/05/2015  . Palliative care encounter   . DNR (do not resuscitate)   . Diverticulitis 11/02/2015  . Acute diverticulitis 11/02/2015  . AKI (acute kidney injury) (HCC) 11/02/2015  . Anemia 11/02/2015  . RLL pneumonia (HCC) 11/02/2015  . Pneumonia 04/12/2015  . Failure to thrive in adult 11/11/2014  . Acute encephalopathy 11/11/2014  . Generalized weakness 11/11/2014  . Hypokalemia 11/11/2014  . Bradycardia 11/11/2014  . Dehydration 01/26/2014  . Orthostatic dizziness 01/24/2014  . SOB (shortness of breath) 01/24/2014  . Chronic respiratory failure with hypoxia (HCC) 01/24/2014  . Cough with hemoptysis 01/24/2014  . COPD (chronic obstructive pulmonary disease) (HCC) 01/24/2014  . Acute renal failure (HCC) 01/24/2014  . Dizzy 01/24/2014  . Acute-on-chronic respiratory failure (HCC) 10/12/2012  . Chest pain 10/10/2012  . CAP (community acquired pneumonia) 10/10/2012  . Aortic aneurysm (HCC) 04/16/2012  . Chest pain 08/01/2011  . HYPERCALCEMIA 06/16/2008  . ALLERGIC RHINITIS 12/05/2007  . WEIGHT GAIN 11/07/2007  . PAIN IN JOINT, LOWER LEG 10/09/2007  . TOBACCO ABUSE 09/29/2007  . Abdominal aortic aneurysm (HCC) 09/29/2007  . Sciatica 09/24/2007  . BACK PAIN 09/24/2007  . Anxiety disorder 11/28/2006  . DEPRESSION 11/28/2006  . CATARACT NOS 11/28/2006  . Essential hypertension 11/28/2006  . MYOCARDIAL INFARCTION, HX OF  11/28/2006  . COPD 11/28/2006  . OVERACTIVE BLADDER 11/28/2006  . UTI'S, RECURRENT 11/28/2006  . LOW BACK PAIN 11/28/2006  . URINARY INCONTINENCE 11/28/2006    Past Surgical History:  Procedure Laterality Date  . APPENDECTOMY    . CHOLECYSTECTOMY      OB History    Gravida Para Term Preterm AB Living   5 5 5          SAB TAB Ectopic Multiple Live Births                   Home Medications    Prior to Admission medications     Medication Sig Start Date End Date Taking? Authorizing Provider  acetaminophen (TYLENOL) 500 MG tablet Take 250-500 mg by mouth every 6 (six) hours as needed for mild pain, moderate pain, fever or headache.     [provider]  albuterol (PROVENTIL) (2.5 MG/3ML) 0.083% nebulizer solution Take 2.5 mg by nebulization every 6 (six) hours as needed for shortness of breath (uses it regularly).     [provider]  ALPRAZolam Prudy Feeler) 1 MG tablet Take 1 mg by mouth 2 (two) times daily as needed.     [provider]  aspirin EC 81 MG tablet Take 81 mg by mouth daily.    [provider]  budesonide-formoterol (SYMBICORT) 160-4.5 MCG/ACT inhaler Inhale 1 puff into the lungs 2 (two) times daily.     [provider]  citalopram (CELEXA) 20 MG tablet Take 20 mg by mouth at bedtime.     [provider]  diphenhydrAMINE (BENADRYL) 25 MG tablet Take 25 mg by mouth every 6 (six) hours as needed for allergies.    [provider]  gabapentin (NEURONTIN) 100 MG capsule Take 100 mg by mouth 5 (five) times daily.     [provider]  isosorbide mononitrate (IMDUR) 30 MG 24 hr tablet Take 1 tablet by mouth daily. 07/15/17   [provider]  morphine (MS CONTIN) 15 MG 12 hr tablet Take 1 tablet (15 mg total) by mouth every 12 (twelve) hours. Patient not taking: Reported on 07/17/2017 07/10/17   Raeford Razor, MD  Oxycodone HCl 10 MG TABS Take 1 tablet by mouth every 4 (four) hours as needed for pain. 07/11/17   [provider]  tiotropium (SPIRIVA) 18 MCG inhalation capsule Place 18 mcg into inhaler and inhale at bedtime.     [provider]  torsemide (DEMADEX) 20 MG tablet Take 1 tablet by mouth daily. 07/11/17   [provider]  traMADol (ULTRAM) 50 MG tablet Take 1 tablet (50 mg total) by mouth every 6 (six) hours as needed. Patient not taking: Reported on 07/17/2017 02/10/17   [name], MD    Family  History Family History  Problem Relation Age of Onset  . Diabetes Brother   . Cancer Brother        Throat cancer, Heart attack  . Heart disease Brother   . Heart attack Brother   . Cancer Father   . Cancer Sister     Social History Social History  Substance Use Topics  . Smoking status: Former Smoker    Years: 50.00    Types: Cigarettes    Quit date: 12/31/2010  . Smokeless tobacco: Former Neurosurgeon    Quit date: 12/31/2010  . Alcohol use No  lives at home Lives with family Patient is on oxygen [place]   Allergies   Ciprofloxacin hcl; Nsaids; Other; Sulfa antibiotics; Sulfur; and Tomato  Review of Systems Review of Systems  Unable to perform ROS: Mental status change     Physical Exam Updated Vital Signs ED Triage Vitals  Enc Vitals Group     BP 08/11/17 0003 93/74     Pulse Rate 08/11/17 0003 90     Resp 08/11/17 0003 20     Temp 08/11/17 0003 100.2 F (37.9 C)     Temp Source 08/11/17 0003 Oral     SpO2 08/11/17 0003 95 %     Weight 08/10/17 2355 134 lb (60.8 kg)     Height 08/10/17 2355 5\' 1"  (1.549 m)     Head Circumference --      Peak Flow --      Pain Score 08/10/17 2355 0     Pain Loc --      Pain Edu? --      Excl. in GC? --      Vital signs normal  Except for fever, hypotension   Physical Exam  Constitutional:  Non-toxic appearance. She does not appear ill.  Frail elderly female  HENT:  Head: Normocephalic and atraumatic.  Right Ear: External ear normal.  Left Ear: External ear normal.  Nose: Nose normal. No mucosal edema or rhinorrhea.  Mouth/Throat: Oropharynx is clear and moist and mucous membranes are normal. No dental abscesses or uvula swelling.  Eyes: Pupils are equal, round, and reactive to light. Conjunctivae and EOM are normal.  Neck: Normal range of motion and full passive range of motion without pain. Neck supple.  Cardiovascular: Normal rate, regular rhythm and normal heart sounds.  Exam reveals no gallop and no friction rub.   No  murmur heard. Pulmonary/Chest: Effort normal and breath sounds normal. No respiratory distress. She has no wheezes. She has no rhonchi. She has no rales. She exhibits no tenderness and no crepitus.  Abdominal: Soft. Normal appearance and bowel sounds are normal. She exhibits no distension. There is no tenderness. There is no rebound and no guarding.  Musculoskeletal: Normal range of motion. She exhibits no edema or tenderness.  Moves all extremities well.   Neurological: She is alert. She has normal strength. No cranial nerve deficit.  Patient know she's at the hospital and that she is at [facility] however it is very slow for her to give me that information.  Skin: Skin is warm, dry and intact. No rash noted. No erythema. There is pallor.  Patient has a 3 x 4 superficial decubitus ulcer on her left buttock with a large area of redness around it. Patient states it doesn't hurt and she was unaware she had it.  Psychiatric: She has a normal mood and affect. Her speech is normal and behavior is normal. Her mood appears not anxious.  Nursing note and vitals reviewed.      ED Treatments / Results  Labs (all labs ordered are listed, but only abnormal results are displayed) Results for orders placed or performed during the hospital encounter of 08/10/17  Comprehensive metabolic panel  Result Value Ref Range   Sodium 138 135 - 145 mmol/L   Potassium 3.6 3.5 - 5.1 mmol/L   Chloride 98 (L) 101 - 111 mmol/L   CO2 31 22 - 32 mmol/L   Glucose, Bld 111 (H) 65 - 99 mg/dL   BUN 25 (H) 6 - 20 mg/dL   Creatinine, Ser 4.09 (H) 0.44 - 1.00 mg/dL   Calcium 8.9 8.9 - 81.1 mg/dL   Total Protein 6.2 (L) 6.5 - 8.1 g/dL  Albumin 2.7 (L) 3.5 - 5.0 g/dL   AST 18 15 - 41 U/L   ALT 12 (L) 14 - 54 U/L   Alkaline Phosphatase 75 38 - 126 U/L   Total Bilirubin 0.7 0.3 - 1.2 mg/dL   GFR calc non Af Amer 33 (L) >60 mL/min   GFR calc Af Amer 38 (L) >60 mL/min   Anion gap 9 5 - 15  CBC WITH DIFFERENTIAL  Result  Value Ref Range   WBC 6.0 4.0 - 10.5 K/uL   RBC 2.68 (L) 3.87 - 5.11 MIL/uL   Hemoglobin 7.6 (L) 12.0 - 15.0 g/dL   HCT 16.1 (L) 09.6 - 04.5 %   MCV 94.8 78.0 - 100.0 fL   MCH 28.4 26.0 - 34.0 pg   MCHC 29.9 (L) 30.0 - 36.0 g/dL   RDW 40.9 (H) 81.1 - 91.4 %   Platelets 342 150 - 400 K/uL   Neutrophils Relative % 61 %   Neutro Abs 3.7 1.7 - 7.7 K/uL   Lymphocytes Relative 23 %   Lymphs Abs 1.4 0.7 - 4.0 K/uL   Monocytes Relative 14 %   Monocytes Absolute 0.8 0.1 - 1.0 K/uL   Eosinophils Relative 2 %   Eosinophils Absolute 0.1 0.0 - 0.7 K/uL   Basophils Relative 0 %   Basophils Absolute 0.0 0.0 - 0.1 K/uL  I-Stat CG4 Lactic Acid, ED  (not at  [facility])  Result Value Ref Range   Lactic Acid, Venous 1.14 0.5 - 1.9 mmol/L  I-Stat CG4 Lactic Acid, ED  (not at  [facility])  Result Value Ref Range   Lactic Acid, Venous 1.28 0.5 - 1.9 mmol/L   Laboratory interpretation all normal except Stable anemia, stable renal insufficiency    EKG  EKG Interpretation  Date/Time:  Sunday August 11 2017 00:36:43 EDT Ventricular Rate:  85 PR Interval:    QRS Duration: 95 QT Interval:  376 QTC Calculation: 448 R Axis:   17 Text Interpretation:  Sinus rhythm Borderline prolonged PR interval No significant change since last tracing 17 Jul 2017 Confirmed by Devoria Albe ([id number]) on 08/11/2017 1:05:03 AM       Radiology Dg Chest Port 1 View  Result Date: 08/11/2017 CLINICAL DATA:  83 y/o F; wound in sacral area with possible infection. Fever. EXAM: PORTABLE CHEST 1 VIEW COMPARISON:  07/11/2017 chest radiograph FINDINGS: Stable cardiomegaly given projection and technique. Aortic atherosclerosis with calcification. Emphysema. Interstitial opacities. Stable of left hemidiaphragm with left basilar atelectasis. No pleural effusion or pneumothorax. No acute osseous abnormality is evident. IMPRESSION: Stable cardiomegaly, aortic atherosclerosis, emphysema, elevated left hemidiaphragm, and left basilar atelectasis.  Interstitial opacities, probably interstitial edema. Electronically Signed   By: Mitzi Hansen M.D.   On: 08/11/2017 01:03    Procedures Procedures (including critical care time)  CRITICAL CARE Performed by: Nephtali Docken L Bao Bazen Total critical care time: 38 minutes Critical care time was exclusive of separately billable procedures and treating other patients. Critical care was necessary to treat or prevent imminent or life-threatening deterioration. Critical care was time spent personally by me on the following activities: development of treatment plan with patient and/or surrogate as well as nursing, discussions with consultants, evaluation of patient's response to treatment, examination of patient, obtaining history from patient or surrogate, ordering and performing treatments and interventions, ordering and review of laboratory studies, ordering and review of radiographic studies, pulse oximetry and re-evaluation of patient's condition.   Medications Ordered in ED Medications  norepinephrine (LEVOPHED) 4 mg in dextrose  5 % 250 mL (0.016 mg/mL) infusion (2 mcg/min Intravenous New Bag/Given 08/11/17 0459)  sodium chloride 0.9 % bolus 1,000 mL (0 mLs Intravenous Stopped 08/11/17 0129)    And  sodium chloride 0.9 % bolus 1,000 mL (0 mLs Intravenous Stopped 08/11/17 0224)  piperacillin-tazobactam (ZOSYN) IVPB 3.375 g (0 g Intravenous Stopped 08/11/17 0129)  vancomycin (VANCOCIN) IVPB 1000 mg/200 mL premix (0 mg Intravenous Stopped 08/11/17 0304)  acetaminophen (TYLENOL) solution 650 mg ( Oral Given 08/11/17 0056)     Initial Impression / Assessment and Plan / ED Course  I have reviewed the triage vital signs and the nursing notes.  Pertinent labs & imaging results that were available during my care of the patient were reviewed by me and considered in my medical decision making (see chart for details).    Patient was started on sepsis protocol. She was given a 2 L bolus of fluids and started  on IV vancomycin and Zosyn for wound infection possibly from her decubitus ulcer. This is the most likely site of her infection.  Recheck at 4:40 AM patient is awake and alert, she now states she starting to hurt in her buttock area. Her family is here. Her family member states she started getting the reddened area on her buttock about 3 days ago and she got some ointment (lanolin) at the pharmacy to use on it. She states this morning the skin was intact however when she checked it this evening she had started getting the breakdown. She also states this evening the patient was just staring at her and not interacting normally. She states however she's been eating 2 meals a day and drinking water and ensure plus at least 2 cans a day.  Patient had her 2 L bolus of fluid and her blood pressure did come up to 90 however started to drop again. She was given an additional liter and her blood pressure was in the 80s. When I review her prior vital signs her blood pressure is normally around 100. She was started on Levophed. Nurses also reports she has not had urinary output.Her lactic acid has remained normal 2.  05:06 AM Dr Onalee Hua, hospitalist will admit  Final Clinical Impressions(s) / ED Diagnoses   Final diagnoses:  Decubitus ulcer of sacral region, unspecified ulcer stage  Fever, unspecified fever cause  Hypotension, unspecified hypotension type  Sepsis, due to unspecified organism [facility])    Plan admission  Devoria Albe, MD, Concha Pyo, MD 08/11/17 [phone]

## 2017-08-11 NOTE — ED Notes (Signed)
[place] female external catheter applied to obtain urine sample

## 2017-08-11 NOTE — Progress Notes (Signed)
Subjective: This is an assumption of care note of this 83 year old who came to the emergency department with what appears to be sepsis likely from an infected decubitus ulcer. She has multiple medical problems including COPD, coronary disease, heart failure failure to thrive and inoperable aortic aneurysm. She was here about 6 weeks ago with what appeared to be leaking of her aneurysm but she survived that episode and went home. She has come in now hypotensive her hemoglobin has dropped. She is requiring pressor support. She is on broad-spectrum antibiotics. She did not complain of any abdominal pain on admission but she is complaining of some abdominal tenderness now.  Objective: Vital signs in last 24 hours: Temp:  [97.8 F (36.6 C)-100.9 F (38.3 C)] 98.2 F (36.8 C) (08/12 0800) Pulse Rate:  [65-90] 65 (08/12 0530) Resp:  [11-20] 11 (08/12 0530) BP: (70-102)/(58-84) 97/84 (08/12 0530) SpO2:  [91 %-98 %] 95 % (08/12 0530) Weight:  [56.6 kg (124 lb 12.5 oz)-60.8 kg (134 lb)] 56.6 kg (124 lb 12.5 oz) (08/12 0644) Weight change:     Intake/Output from previous day: 08/11 0701 - 08/12 0700 In: 200 [IV Piggyback:200] Out: -   PHYSICAL EXAM General appearance: Sleepy but arousable looks chronically sick Resp: rhonchi bilaterally Cardio: regular rate and rhythm, S1, S2 normal, no murmur, click, rub or gallop GI: She has minimal diffuse abdominal tenderness Extremities: extremities normal, atraumatic, no cyanosis or edema She has a decubitus with erythema around it on her sacrum  Lab Results:  Results for orders placed or performed during the hospital encounter of 08/10/17 (from the past 48 hour(s))  Blood Culture (routine x 2)     Status: None (Preliminary result)   Collection Time: 08/11/17 12:30 AM  Result Value Ref Range   Specimen Description LEFT ANTECUBITAL    Special Requests      BOTTLES DRAWN AEROBIC AND ANAEROBIC Blood Culture adequate volume   Culture NO GROWTH < 12  HOURS    Report Status PENDING   Comprehensive metabolic panel     Status: Abnormal   Collection Time: 08/11/17 12:32 AM  Result Value Ref Range   Sodium 138 135 - 145 mmol/L   Potassium 3.6 3.5 - 5.1 mmol/L   Chloride 98 (L) 101 - 111 mmol/L   CO2 31 22 - 32 mmol/L   Glucose, Bld 111 (H) 65 - 99 mg/dL   BUN 25 (H) 6 - 20 mg/dL   Creatinine, Ser 1.36 (H) 0.44 - 1.00 mg/dL   Calcium 8.9 8.9 - 10.3 mg/dL   Total Protein 6.2 (L) 6.5 - 8.1 g/dL   Albumin 2.7 (L) 3.5 - 5.0 g/dL   AST 18 15 - 41 U/L   ALT 12 (L) 14 - 54 U/L   Alkaline Phosphatase 75 38 - 126 U/L   Total Bilirubin 0.7 0.3 - 1.2 mg/dL   GFR calc non Af Amer 33 (L) >60 mL/min   GFR calc Af Amer 38 (L) >60 mL/min    Comment: (NOTE) The eGFR has been calculated using the CKD EPI equation. This calculation has not been validated in all clinical situations. eGFR's persistently <60 mL/min signify possible Chronic Kidney Disease.    Anion gap 9 5 - 15  CBC WITH DIFFERENTIAL     Status: Abnormal   Collection Time: 08/11/17 12:32 AM  Result Value Ref Range   WBC 6.0 4.0 - 10.5 K/uL   RBC 2.68 (L) 3.87 - 5.11 MIL/uL   Hemoglobin 7.6 (L) 12.0 - 15.0  g/dL   HCT 25.4 (L) 36.0 - 46.0 %   MCV 94.8 78.0 - 100.0 fL   MCH 28.4 26.0 - 34.0 pg   MCHC 29.9 (L) 30.0 - 36.0 g/dL   RDW 16.2 (H) 11.5 - 15.5 %   Platelets 342 150 - 400 K/uL    Comment: SPECIMEN CHECKED FOR CLOTS PLATELET COUNT CONFIRMED BY SMEAR    Neutrophils Relative % 61 %   Neutro Abs 3.7 1.7 - 7.7 K/uL   Lymphocytes Relative 23 %   Lymphs Abs 1.4 0.7 - 4.0 K/uL   Monocytes Relative 14 %   Monocytes Absolute 0.8 0.1 - 1.0 K/uL   Eosinophils Relative 2 %   Eosinophils Absolute 0.1 0.0 - 0.7 K/uL   Basophils Relative 0 %   Basophils Absolute 0.0 0.0 - 0.1 K/uL  Blood Culture (routine x 2)     Status: None (Preliminary result)   Collection Time: 08/11/17 12:32 AM  Result Value Ref Range   Specimen Description RIGHT ANTECUBITAL    Special Requests       BOTTLES DRAWN AEROBIC AND ANAEROBIC Blood Culture adequate volume   Culture NO GROWTH < 12 HOURS    Report Status PENDING   I-Stat CG4 Lactic Acid, ED  (not at  [facility])     Status: None   Collection Time: 08/11/17 12:41 AM  Result Value Ref Range   Lactic Acid, Venous 1.14 0.5 - 1.9 mmol/L  I-Stat CG4 Lactic Acid, ED  (not at  [facility])     Status: None   Collection Time: 08/11/17  3:35 AM  Result Value Ref Range   Lactic Acid, Venous 1.28 0.5 - 1.9 mmol/L  CBC     Status: Abnormal   Collection Time: 08/11/17  5:59 AM  Result Value Ref Range   WBC 6.1 4.0 - 10.5 K/uL   RBC 2.39 (L) 3.87 - 5.11 MIL/uL   Hemoglobin 6.9 (LL) 12.0 - 15.0 g/dL    Comment: REPEATED TO VERIFY CRITICAL RESULT CALLED TO, READ BACK BY AND VERIFIED WITH: SHANNON GAMMONS AT 647 BY HFLYNT 08/11/17    HCT 22.9 (L) 36.0 - 46.0 %   MCV 95.8 78.0 - 100.0 fL   MCH 28.9 26.0 - 34.0 pg   MCHC 30.1 30.0 - 36.0 g/dL   RDW 16.4 (H) 11.5 - 15.5 %   Platelets 320 150 - 400 K/uL    ABGS No results for input(s): PHART, PO2ART, TCO2, HCO3 in the last 72 hours.  Invalid input(s): PCO2 CULTURES Recent Results (from the past 240 hour(s))  Blood Culture (routine x 2)     Status: None (Preliminary result)   Collection Time: 08/11/17 12:30 AM  Result Value Ref Range Status   Specimen Description LEFT ANTECUBITAL  Final   Special Requests   Final    BOTTLES DRAWN AEROBIC AND ANAEROBIC Blood Culture adequate volume   Culture NO GROWTH < 12 HOURS  Final   Report Status PENDING  Incomplete  Blood Culture (routine x 2)     Status: None (Preliminary result)   Collection Time: 08/11/17 12:32 AM  Result Value Ref Range Status   Specimen Description RIGHT ANTECUBITAL  Final   Special Requests   Final    BOTTLES DRAWN AEROBIC AND ANAEROBIC Blood Culture adequate volume   Culture NO GROWTH < 12 HOURS  Final   Report Status PENDING  Incomplete   Studies/Results: Dg Chest Port 1 View  Result Date: 08/11/2017 CLINICAL DATA:  83 y/o  F;  wound in sacral area with possible infection. Fever. EXAM: PORTABLE CHEST 1 VIEW COMPARISON:  07/11/2017 chest radiograph FINDINGS: Stable cardiomegaly given projection and technique. Aortic atherosclerosis with calcification. Emphysema. Interstitial opacities. Stable of left hemidiaphragm with left basilar atelectasis. No pleural effusion or pneumothorax. No acute osseous abnormality is evident. IMPRESSION: Stable cardiomegaly, aortic atherosclerosis, emphysema, elevated left hemidiaphragm, and left basilar atelectasis. Interstitial opacities, probably interstitial edema. Electronically Signed   By: Kristine Garbe M.D.   On: 08/11/2017 01:03    Medications:  Prior to Admission:  Prescriptions Prior to Admission  Medication Sig Dispense Refill Last Dose  . acetaminophen (TYLENOL) 500 MG tablet Take 250-500 mg by mouth every 6 (six) hours as needed for mild pain, moderate pain, fever or headache.    unknown  . albuterol (PROVENTIL) (2.5 MG/3ML) 0.083% nebulizer solution Take 2.5 mg by nebulization every 6 (six) hours as needed for shortness of breath (uses it regularly).    unknown  . ALPRAZolam (XANAX) 1 MG tablet Take 1 mg by mouth 2 (two) times daily as needed.    unknown  . aspirin EC 81 MG tablet Take 81 mg by mouth daily.   07/17/2017 at Unknown time  . budesonide-formoterol (SYMBICORT) 160-4.5 MCG/ACT inhaler Inhale 1 puff into the lungs 2 (two) times daily.    07/16/2017 at Unknown time  . citalopram (CELEXA) 20 MG tablet Take 20 mg by mouth at bedtime.    07/16/2017 at Unknown time  . diphenhydrAMINE (BENADRYL) 25 MG tablet Take 25 mg by mouth every 6 (six) hours as needed for allergies.   unknown  . gabapentin (NEURONTIN) 100 MG capsule Take 100 mg by mouth 5 (five) times daily.    07/17/2017 at Unknown time  . isosorbide mononitrate (IMDUR) 30 MG 24 hr tablet Take 1 tablet by mouth daily.   07/17/2017 at Unknown time  . morphine (MS CONTIN) 15 MG 12 hr tablet Take 1 tablet (15 mg  total) by mouth every 12 (twelve) hours. (Patient not taking: Reported on 07/17/2017) 10 tablet 0 Not Taking at Unknown time  . Oxycodone HCl 10 MG TABS Take 1 tablet by mouth every 4 (four) hours as needed for pain.   07/17/2017 at Unknown time  . tiotropium (SPIRIVA) 18 MCG inhalation capsule Place 18 mcg into inhaler and inhale at bedtime.    07/16/2017 at Unknown time  . torsemide (DEMADEX) 20 MG tablet Take 1 tablet by mouth daily.   07/17/2017 at Unknown time  . traMADol (ULTRAM) 50 MG tablet Take 1 tablet (50 mg total) by mouth every 6 (six) hours as needed. (Patient not taking: Reported on 07/17/2017) 20 tablet 0 Not Taking at Unknown time   Scheduled:  Continuous: . norepinephrine (LEVOPHED) Adult infusion 2 mcg/min (08/11/17 0459)  . piperacillin-tazobactam (ZOSYN)  IV    . [START ON 08/12/2017] vancomycin     PRN:  Assesment:She was admitted with presumed sepsis. She is on broad-spectrum antibiotics and is requiring pressor support although that has been able to be decreased. She has what looks like an infected stage II or 3 decubitus ulcer on her sacrum. She has COPD at baseline with chronic hypoxic respiratory failure. She has an abdominal aortic aneurysm and she is not a surgical candidate. She has chronic diastolic heart failure which is stable. She has chronic back pain from severe degenerative disc disease. At baseline she has chronic kidney disease. I think she was probably somewhat dehydrated and her hemoglobin level has dropped some of which may be  hemodilution but of course it raises a question about whether she has leaking of her abdominal aortic aneurysm again. Although we can't do anything about her aneurysm it may give [place] prognostic information if we know if it is leaking. Principal Problem:   Sepsis ([place]) Active Problems:   Anxiety disorder   Abdominal aortic aneurysm (HCC)   Chronic respiratory failure with hypoxia (HCC)   COPD (chronic obstructive pulmonary disease)  (HCC)   DNR (do not resuscitate)   CKD (chronic kidney disease) stage 3, GFR 30-59 ml/min   Chronic diastolic CHF (congestive heart failure) (HCC)   Chronic pain syndrome   Leaking abdominal aortic aneurysm (AAA) (HCC)   Cellulitis   Decubitus skin ulcer    Plan: I will discuss with her family. Consider blood transfusion. Consider CT.    LOS: 0 days   Holliday Sheaffer L 08/11/2017, 8:55 AM
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
## 2017-08-11 NOTE — ED Notes (Signed)
Pt's son and daughter-in-law want to be called with any updates.  Home phone [phone]) Daughter-in-law, Forde Dandy [phone]) Shari Heritage, [name] [phone])

## 2017-08-12 LAB — BLOOD CULTURE ID PANEL (REFLEXED)
Acinetobacter baumannii: NOT DETECTED
CANDIDA ALBICANS: NOT DETECTED
CANDIDA TROPICALIS: NOT DETECTED
Candida glabrata: NOT DETECTED
Candida krusei: NOT DETECTED
Candida parapsilosis: NOT DETECTED
Carbapenem resistance: NOT DETECTED
ENTEROBACTER CLOACAE COMPLEX: NOT DETECTED
ENTEROBACTERIACEAE SPECIES: NOT DETECTED
Enterococcus species: NOT DETECTED
Escherichia coli: NOT DETECTED
Haemophilus influenzae: NOT DETECTED
KLEBSIELLA PNEUMONIAE: NOT DETECTED
Klebsiella oxytoca: NOT DETECTED
LISTERIA MONOCYTOGENES: NOT DETECTED
METHICILLIN RESISTANCE: DETECTED — AB
Neisseria meningitidis: NOT DETECTED
PSEUDOMONAS AERUGINOSA: NOT DETECTED
Proteus species: NOT DETECTED
STAPHYLOCOCCUS AUREUS BCID: NOT DETECTED
STAPHYLOCOCCUS SPECIES: DETECTED — AB
STREPTOCOCCUS SPECIES: NOT DETECTED
Serratia marcescens: NOT DETECTED
Streptococcus agalactiae: NOT DETECTED
Streptococcus pneumoniae: NOT DETECTED
Streptococcus pyogenes: NOT DETECTED
Vancomycin resistance: NOT DETECTED

## 2017-08-12 MED ORDER — MIDAZOLAM HCL 2 MG/2ML IJ SOLN: 1.0000 mg | INTRAMUSCULAR | Status: DC | PRN

## 2017-08-12 NOTE — Progress Notes (Addendum)
Subjective: She is resting comfortably on morphine drip. No complaints.  Objective: Vital signs in last 24 hours: Temp:  [97.9 F (36.6 C)-99.2 F (37.3 C)] 98 F (36.7 C) (08/13 0718) Pulse Rate:  [56-86] 60 (08/13 0500) Resp:  [7-26] 9 (08/13 0718) BP: (75-134)/(55-104) 75/64 (08/13 0600) SpO2:  [90 %-100 %] 99 % (08/13 0500) Weight:  [56.6 kg (124 lb 12.5 oz)] 56.6 kg (124 lb 12.5 oz) (08/13 0500) Weight change: -4.182 kg (-9 lb 3.5 oz)    Intake/Output from previous day: 08/12 0701 - 08/13 0700 In: 1558.8 [P.O.:340; I.V.:1118.8; IV Piggyback:100] Out: 1500 [Urine:1500]  PHYSICAL EXAM General appearance: Sleepy. Will open her eyes. Looks comfortable Resp: clear to auscultation bilaterally Cardio: regular rate and rhythm, S1, S2 normal, no murmur, click, rub or gallop GI: soft, non-tender; bowel sounds normal; no masses,  no organomegaly Extremities: extremities normal, atraumatic, no cyanosis or edema Skin turgor fair  Lab Results:  Results for orders placed or performed during the hospital encounter of 08/10/17 (from the past 48 hour(s))  Blood Culture (routine x 2)     Status: None (Preliminary result)   Collection Time: 08/11/17 12:30 AM  Result Value Ref Range   Specimen Description LEFT ANTECUBITAL    Special Requests      BOTTLES DRAWN AEROBIC AND ANAEROBIC Blood Culture adequate volume   Culture  Setup Time      GRAM POSITIVE COCCI Gram Stain Report Called to,Read Back By and Verified With: SMITH,J. AT 0045 ON 08/13/20181 BY EVA IN BOTH AEROBIC AND ANAEROBIC BOTTLES Performed at [facility]    Report Status PENDING   Comprehensive metabolic panel     Status: Abnormal   Collection Time: 08/11/17 12:32 AM  Result Value Ref Range   Sodium 138 135 - 145 mmol/L   Potassium 3.6 3.5 - 5.1 mmol/L   Chloride 98 (L) 101 - 111 mmol/L   CO2 31 22 - 32 mmol/L   Glucose, Bld 111 (H) 65 - 99 mg/dL   BUN 25 (H) 6 - 20 mg/dL    Creatinine, Ser 1.36 (H) 0.44 - 1.00 mg/dL   Calcium 8.9 8.9 - 10.3 mg/dL   Total Protein 6.2 (L) 6.5 - 8.1 g/dL   Albumin 2.7 (L) 3.5 - 5.0 g/dL   AST 18 15 - 41 U/L   ALT 12 (L) 14 - 54 U/L   Alkaline Phosphatase 75 38 - 126 U/L   Total Bilirubin 0.7 0.3 - 1.2 mg/dL   GFR calc non Af Amer 33 (L) >60 mL/min   GFR calc Af Amer 38 (L) >60 mL/min    Comment: (NOTE) The eGFR has been calculated using the CKD EPI equation. This calculation has not been validated in all clinical situations. eGFR's persistently <60 mL/min signify possible Chronic Kidney Disease.    Anion gap 9 5 - 15  CBC WITH DIFFERENTIAL     Status: Abnormal   Collection Time: 08/11/17 12:32 AM  Result Value Ref Range   WBC 6.0 4.0 - 10.5 K/uL   RBC 2.68 (L) 3.87 - 5.11 MIL/uL   Hemoglobin 7.6 (L) 12.0 - 15.0 g/dL   HCT 25.4 (L) 36.0 - 46.0 %   MCV 94.8 78.0 - 100.0 fL   MCH 28.4 26.0 - 34.0 pg   MCHC 29.9 (L) 30.0 - 36.0 g/dL   RDW 16.2 (H) 11.5 - 15.5 %   Platelets 342 150 - 400 K/uL    Comment:  SPECIMEN CHECKED FOR CLOTS PLATELET COUNT CONFIRMED BY SMEAR    Neutrophils Relative % 61 %   Neutro Abs 3.7 1.7 - 7.7 K/uL   Lymphocytes Relative 23 %   Lymphs Abs 1.4 0.7 - 4.0 K/uL   Monocytes Relative 14 %   Monocytes Absolute 0.8 0.1 - 1.0 K/uL   Eosinophils Relative 2 %   Eosinophils Absolute 0.1 0.0 - 0.7 K/uL   Basophils Relative 0 %   Basophils Absolute 0.0 0.0 - 0.1 K/uL  Blood Culture (routine x 2)     Status: None (Preliminary result)   Collection Time: 08/11/17 12:32 AM  Result Value Ref Range   Specimen Description RIGHT ANTECUBITAL    Special Requests      BOTTLES DRAWN AEROBIC AND ANAEROBIC Blood Culture adequate volume   Culture  Setup Time      GRAM POSITIVE COCCI Gram Stain Report Called to,Read Back By and Verified With: ALLEN,N. AT 1852 ON 08/11/2017 BY EVA AEROBIC BOTTLE ONLY Performed at [facility] Organism ID to follow Performed at [facility], [place] [address], [place], [place] [address]    Culture GRAM POSITIVE COCCI    Report Status PENDING   Blood Culture ID Panel (Reflexed)     Status: Abnormal   Collection Time: 08/11/17 12:32 AM  Result Value Ref Range   Enterococcus species NOT DETECTED NOT DETECTED   Vancomycin resistance NOT DETECTED NOT DETECTED   Listeria monocytogenes NOT DETECTED NOT DETECTED   Staphylococcus species DETECTED (A) NOT DETECTED    Comment: CRITICAL RESULT CALLED TO, READ BACK BY AND VERIFIED WITH: Bethann Humble RN, AT [phone] 08/12/17 BY D. VANHOOK    Staphylococcus aureus NOT DETECTED NOT DETECTED   Methicillin resistance DETECTED (A) NOT DETECTED    Comment: CRITICAL RESULT CALLED TO, READ BACK BY AND VERIFIED WITH: Bethann Humble RN, AT [phone] 08/12/17 BY D. VANHOOK    Streptococcus species NOT DETECTED NOT DETECTED   Streptococcus agalactiae NOT DETECTED NOT DETECTED   Streptococcus pneumoniae NOT DETECTED NOT DETECTED   Streptococcus pyogenes NOT DETECTED NOT DETECTED   Acinetobacter baumannii NOT DETECTED NOT DETECTED   Enterobacteriaceae species NOT DETECTED NOT DETECTED   Enterobacter cloacae complex NOT DETECTED NOT DETECTED   Escherichia coli NOT DETECTED NOT DETECTED   Klebsiella oxytoca NOT DETECTED NOT DETECTED   Klebsiella pneumoniae NOT DETECTED NOT DETECTED   Proteus species NOT DETECTED NOT DETECTED   Serratia marcescens NOT DETECTED NOT DETECTED   Carbapenem resistance NOT DETECTED NOT DETECTED   Haemophilus influenzae NOT DETECTED NOT DETECTED   Neisseria meningitidis NOT DETECTED NOT DETECTED   Pseudomonas aeruginosa NOT DETECTED NOT DETECTED   Candida albicans NOT DETECTED NOT DETECTED   Candida glabrata NOT DETECTED NOT DETECTED   Candida krusei NOT DETECTED NOT DETECTED   Candida parapsilosis NOT DETECTED NOT DETECTED   Candida tropicalis NOT DETECTED NOT DETECTED    Comment: Performed at [facility], [address], [place], [place] [address]  I-Stat CG4 Lactic  Acid, ED  (not at  [facility])     Status: None   Collection Time: 08/11/17 12:41 AM  Result Value Ref Range   Lactic Acid, Venous 1.14 0.5 - 1.9 mmol/L  I-Stat CG4 Lactic Acid, ED  (not at  [facility])     Status: None   Collection Time: 08/11/17  3:35 AM  Result Value Ref Range   Lactic Acid, Venous 1.28 0.5 - 1.9 mmol/L  CBC     Status: Abnormal   Collection Time: 08/11/17  5:59 AM  Result Value Ref Range   WBC 6.1 4.0 - 10.5 K/uL   RBC 2.39 (L) 3.87 - 5.11 MIL/uL   Hemoglobin 6.9 (LL) 12.0 - 15.0 g/dL    Comment: REPEATED TO VERIFY CRITICAL RESULT CALLED TO, READ BACK BY AND VERIFIED WITH: SHANNON GAMMONS AT 647 BY HFLYNT 08/11/17    HCT 22.9 (L) 36.0 - 46.0 %   MCV 95.8 78.0 - 100.0 fL   MCH 28.9 26.0 - 34.0 pg   MCHC 30.1 30.0 - 36.0 g/dL   RDW 16.4 (H) 11.5 - 15.5 %   Platelets 320 150 - 400 K/uL  MRSA PCR Screening     Status: Abnormal   Collection Time: 08/11/17  6:44 AM  Result Value Ref Range   MRSA by PCR POSITIVE (A) NEGATIVE    Comment:        The GeneXpert MRSA Assay (FDA approved for NASAL specimens only), is one component of a comprehensive MRSA colonization surveillance program. It is not intended to diagnose MRSA infection nor to guide or monitor treatment for MRSA infections. RESULT CALLED TO, READ BACK BY AND VERIFIED WITH: ALLEN,N. AT 1712 ON 08/11/2017 BY BAUGHAM,M.   Urinalysis, Routine w reflex microscopic     Status: Abnormal   Collection Time: 08/11/17 10:59 AM  Result Value Ref Range   Color, Urine YELLOW YELLOW   APPearance HAZY (A) CLEAR   Specific Gravity, Urine 1.014 1.005 - 1.030   pH 5.0 5.0 - 8.0   Glucose, UA NEGATIVE NEGATIVE mg/dL   Hgb urine dipstick SMALL (A) NEGATIVE   Bilirubin Urine NEGATIVE NEGATIVE   Ketones, ur NEGATIVE NEGATIVE mg/dL   Protein, ur NEGATIVE NEGATIVE mg/dL   Nitrite NEGATIVE NEGATIVE   Leukocytes, UA NEGATIVE NEGATIVE   RBC / HPF 0-5 0 - 5 RBC/hpf   WBC, UA 0-5 0 - 5 WBC/hpf   Bacteria, UA FEW (A) NONE SEEN    Squamous Epithelial / LPF 0-5 (A) NONE SEEN   Mucous PRESENT    Budding Yeast PRESENT    Hyaline Casts, UA PRESENT     ABGS No results for input(s): PHART, PO2ART, TCO2, HCO3 in the last 72 hours.  Invalid input(s): PCO2 CULTURES Recent Results (from the past 240 hour(s))  Blood Culture (routine x 2)     Status: None (Preliminary result)   Collection Time: 08/11/17 12:30 AM  Result Value Ref Range Status   Specimen Description LEFT ANTECUBITAL  Final   Special Requests   Final    BOTTLES DRAWN AEROBIC AND ANAEROBIC Blood Culture adequate volume   Culture  Setup Time   Final    GRAM POSITIVE COCCI Gram Stain Report Called to,Read Back By and Verified With: SMITH,J. AT 0045 ON 08/13/20181 BY EVA IN BOTH AEROBIC AND ANAEROBIC BOTTLES Performed at [facility]  Final   Report Status PENDING  Incomplete  Blood Culture (routine x 2)     Status: None (Preliminary result)   Collection Time: 08/11/17 12:32 AM  Result Value Ref Range Status   Specimen Description RIGHT ANTECUBITAL  Final   Special Requests   Final    BOTTLES DRAWN AEROBIC AND ANAEROBIC Blood Culture adequate volume   Culture  Setup Time   Final    GRAM POSITIVE COCCI Gram Stain Report Called to,Read Back By and Verified With: ALLEN,N. AT 1852 ON 08/11/2017 BY EVA AEROBIC  BOTTLE ONLY Performed at [facility] Organism ID to follow Performed at [facility], [place] [address], [place], [place] [address]    Culture GRAM POSITIVE COCCI  Final   Report Status PENDING  Incomplete  Blood Culture ID Panel (Reflexed)     Status: Abnormal   Collection Time: 08/11/17 12:32 AM  Result Value Ref Range Status   Enterococcus species NOT DETECTED NOT DETECTED Final   Vancomycin resistance NOT DETECTED NOT DETECTED Final   Listeria monocytogenes NOT DETECTED NOT DETECTED Final   Staphylococcus species DETECTED (A) NOT DETECTED Final    Comment:  CRITICAL RESULT CALLED TO, READ BACK BY AND VERIFIED WITH: Bethann Humble RN, AT [phone] 08/12/17 BY D. VANHOOK    Staphylococcus aureus NOT DETECTED NOT DETECTED Final   Methicillin resistance DETECTED (A) NOT DETECTED Final    Comment: CRITICAL RESULT CALLED TO, READ BACK BY AND VERIFIED WITH: Bethann Humble RN, AT [phone] 08/12/17 BY D. VANHOOK    Streptococcus species NOT DETECTED NOT DETECTED Final   Streptococcus agalactiae NOT DETECTED NOT DETECTED Final   Streptococcus pneumoniae NOT DETECTED NOT DETECTED Final   Streptococcus pyogenes NOT DETECTED NOT DETECTED Final   Acinetobacter baumannii NOT DETECTED NOT DETECTED Final   Enterobacteriaceae species NOT DETECTED NOT DETECTED Final   Enterobacter cloacae complex NOT DETECTED NOT DETECTED Final   Escherichia coli NOT DETECTED NOT DETECTED Final   Klebsiella oxytoca NOT DETECTED NOT DETECTED Final   Klebsiella pneumoniae NOT DETECTED NOT DETECTED Final   Proteus species NOT DETECTED NOT DETECTED Final   Serratia marcescens NOT DETECTED NOT DETECTED Final   Carbapenem resistance NOT DETECTED NOT DETECTED Final   Haemophilus influenzae NOT DETECTED NOT DETECTED Final   Neisseria meningitidis NOT DETECTED NOT DETECTED Final   Pseudomonas aeruginosa NOT DETECTED NOT DETECTED Final   Candida albicans NOT DETECTED NOT DETECTED Final   Candida glabrata NOT DETECTED NOT DETECTED Final   Candida krusei NOT DETECTED NOT DETECTED Final   Candida parapsilosis NOT DETECTED NOT DETECTED Final   Candida tropicalis NOT DETECTED NOT DETECTED Final    Comment: Performed at [facility], [place]. [address], [place], [place] [address]  MRSA PCR Screening     Status: Abnormal   Collection Time: 08/11/17  6:44 AM  Result Value Ref Range Status   MRSA by PCR POSITIVE (A) NEGATIVE Final    Comment:        The GeneXpert MRSA Assay (FDA approved for NASAL specimens only), is one component of a comprehensive MRSA colonization surveillance program. It is  not intended to diagnose MRSA infection nor to guide or monitor treatment for MRSA infections. RESULT CALLED TO, READ BACK BY AND VERIFIED WITH: ALLEN,N. AT 1712 ON 08/11/2017 BY BAUGHAM,M.    Studies/Results: Ct Abdomen Pelvis Wo Contrast  Addendum Date: 08/11/2017   ADDENDUM REPORT: 08/11/2017 19:37 ADDENDUM: These results were called by telephone at the time of interpretation on 08/11/2017 at 7:37 pm to Dr. Sinda Du , who verbally acknowledged these results. Electronically Signed   By: Franchot Gallo M.D.   On: 08/11/2017 19:37   Result Date: 08/11/2017 CLINICAL DATA:  Abdominal aortic aneurysm, follow-up EXAM: CT ABDOMEN AND PELVIS WITHOUT CONTRAST TECHNIQUE: Multidetector CT imaging of the abdomen and pelvis was performed following the standard protocol without IV contrast. COMPARISON:  CT abdomen pelvis 07/06/2017 FINDINGS: Lower chest: Small bilateral pleural effusions with bibasilar atelectasis. Cardiac enlargement Hepatobiliary: Normal liver. Gallbladder not visualized.  No biliary dilatation. Pancreas: Negative Spleen: Negative Adrenals/Urinary Tract: Left kidney displaced laterally due to large abdominal aortic aneurysm with retroperitoneal hemorrhage. Mild left hydronephrosis. Negative right kidney. Urinary bladder empty with Foley catheter Stomach/Bowel: Stomach decompressed and normal. Negative for bowel obstruction. No bowel mass or edema. Vascular/Lymphatic: Extensive atherosclerotic calcification. Large infrarenal abdominal aortic aneurysm with a large saccular component projecting laterally on the left. This has increased significantly in size in the interval. The saccular component of the aneurysm now measures 10.3 x 10.6 cm, compared with 8.5 x 9.1 cm previously. There is mixed density within the aneurysm compatible with thrombus. There is now retroperitoneal hemorrhage on the left due to aneurysm rupture. This extends up to the left hemidiaphragm and inferiorly to the pelvis.  This is a moderately large hematoma. Reproductive: Hysterectomy.  No pelvic mass Other: No free fluid in the pelvis Musculoskeletal: Lumbar degenerative changes. No acute skeletal abnormality. IMPRESSION: Atherosclerotic abdominal aorta. Large saccular aneurysm projecting to the left has enlarged significantly in the interval now measuring 10.3 x 10.6 mm. New area of retroperitoneal hemorrhage compatible with aneurysm rupture. Left kidney displaced laterally with mild hydronephrosis due to the aneurysm. Electronically Signed: By: Franchot Gallo M.D. On: 08/11/2017 18:59   Dg Chest Port 1 View  Result Date: 08/11/2017 CLINICAL DATA:  83 y/o F; wound in sacral area with possible infection. Fever. EXAM: PORTABLE CHEST 1 VIEW COMPARISON:  07/11/2017 chest radiograph FINDINGS: Stable cardiomegaly given projection and technique. Aortic atherosclerosis with calcification. Emphysema. Interstitial opacities. Stable of left hemidiaphragm with left basilar atelectasis. No pleural effusion or pneumothorax. No acute osseous abnormality is evident. IMPRESSION: Stable cardiomegaly, aortic atherosclerosis, emphysema, elevated left hemidiaphragm, and left basilar atelectasis. Interstitial opacities, probably interstitial edema. Electronically Signed   By: Kristine Garbe M.D.   On: 08/11/2017 01:03    Medications:  Prior to Admission:  Prescriptions Prior to Admission  Medication Sig Dispense Refill Last Dose  . acetaminophen (TYLENOL) 500 MG tablet Take 250-500 mg by mouth every 6 (six) hours as needed for mild pain, moderate pain, fever or headache.    unknown  . albuterol (PROVENTIL) (2.5 MG/3ML) 0.083% nebulizer solution Take 2.5 mg by nebulization every 6 (six) hours as needed for shortness of breath (uses it regularly).    unknown  . ALPRAZolam (XANAX) 1 MG tablet Take 1 mg by mouth 4 (four) times daily as needed for anxiety.    08/10/2017 at Unknown time  . aspirin EC 81 MG tablet Take 81 mg by mouth  daily.   08/10/2017 at Unknown time  . budesonide-formoterol (SYMBICORT) 160-4.5 MCG/ACT inhaler Inhale 1 puff into the lungs 2 (two) times daily.    08/10/2017 at Unknown time  . citalopram (CELEXA) 20 MG tablet Take 20 mg by mouth at bedtime.    08/10/2017 at Unknown time  . diphenhydrAMINE (BENADRYL) 25 MG tablet Take 25 mg by mouth every 6 (six) hours as needed for allergies.   unknown at Unknown time  . gabapentin (NEURONTIN) 100 MG capsule Take 100 mg by mouth 4 (four) times daily.    08/10/2017 at Unknown time  . isosorbide mononitrate (IMDUR) 30 MG 24 hr tablet Take 1 tablet by mouth daily.   08/10/2017 at Unknown time  . meclizine (ANTIVERT) 25 MG tablet Take 1 tablet by mouth daily as needed for dizziness.   unknown  . oxyCODONE (ROXICODONE) 15 MG immediate release tablet Take 1 tablet by mouth 4 (four) times daily as needed for pain.   08/10/2017 at  Unknown time  . tiotropium (SPIRIVA) 18 MCG inhalation capsule Place 18 mcg into inhaler and inhale at bedtime.    08/10/2017 at Unknown time  . torsemide (DEMADEX) 20 MG tablet Take 1 tablet by mouth daily.   08/10/2017 at Unknown time   Scheduled: . tiotropium  18 mcg Inhalation QHS   Continuous: . morphine 4 mg/hr (08/11/17 2230)  . norepinephrine (LEVOPHED) Adult infusion Stopped (08/11/17 2035)   [id number] **OR** acetaminophen, albuterol, ALPRAZolam, ondansetron **OR** ondansetron (ZOFRAN) IV, oxyCODONE  Assesment: She has ruptured aortic aneurysm. She has been septic and has positive blood cultures. She is on morphine continuous infusion now. She likely will die today discussed by phone with family last nightAnd in person this morning Principal Problem:   Sepsis ([place]) Active Problems:   Anxiety disorder   Abdominal aortic aneurysm ([place])   Chronic respiratory failure with hypoxia ([place])   COPD (chronic obstructive pulmonary disease) ([place])   DNR (do not resuscitate)   CKD (chronic kidney disease) stage 3, GFR 30-59 ml/min    Chronic diastolic CHF (congestive heart failure) ([facility])   Chronic pain syndrome   Leaking abdominal aortic aneurysm (AAA) ([place])   Cellulitis   Decubitus skin ulcer    Plan: As above    LOS: 1 day   Reilley Latorre L 08/12/2017, 7:57 AM

## 2017-08-13 LAB — CULTURE, BLOOD (ROUTINE X 2): SPECIAL REQUESTS: ADEQUATE

## 2017-08-13 NOTE — Clinical Social Work Note (Signed)
Patient on comfort measures. No CSW needs.   LCSW signing off.        Ramzy Cappelletti, Juleen China, LCSW

## 2017-08-13 NOTE — Progress Notes (Signed)
Subjective: She is sleeping and looks comfortable. On continuous morphine drip  Objective: Vital signs in last 24 hours: Temp:  [97.8 F (36.6 C)-100.9 F (38.3 C)] 97.8 F (36.6 C) (08/14 0715) Pulse Rate:  [100-122] 122 (08/14 0230) Resp:  [0-8] 0 (08/14 0715) BP: (68-86)/(57-69) 86/69 (08/14 0400) SpO2:  [86 %-94 %] 86 % (08/14 0230) FiO2 (%):  [36 %] 36 % (08/14 0000) Weight:  [58 kg (127 lb 13.9 oz)] 58 kg (127 lb 13.9 oz) (08/14 0504) Weight change: 1.4 kg (3 lb 1.4 oz)    Intake/Output from previous day: 08/13 0701 - 08/14 0700 In: 72 [I.V.:72] Out: 150 [Urine:150]  PHYSICAL EXAM General appearance: alert, cooperative and no distress Resp: clear to auscultation bilaterally Cardio: regular rate and rhythm, S1, S2 normal, no murmur, click, rub or gallop GI: Diffusely tender Extremities: extremities normal, atraumatic, no cyanosis or edema Skin turgor poor  Lab Results:  Results for orders placed or performed during the hospital encounter of 08/10/17 (from the past 48 hour(s))  Urinalysis, Routine w reflex microscopic     Status: Abnormal   Collection Time: 08/11/17 10:59 AM  Result Value Ref Range   Color, Urine YELLOW YELLOW   APPearance HAZY (A) CLEAR   Specific Gravity, Urine 1.014 1.005 - 1.030   pH 5.0 5.0 - 8.0   Glucose, UA NEGATIVE NEGATIVE mg/dL   Hgb urine dipstick SMALL (A) NEGATIVE   Bilirubin Urine NEGATIVE NEGATIVE   Ketones, ur NEGATIVE NEGATIVE mg/dL   Protein, ur NEGATIVE NEGATIVE mg/dL   Nitrite NEGATIVE NEGATIVE   Leukocytes, UA NEGATIVE NEGATIVE   RBC / HPF 0-5 0 - 5 RBC/hpf   WBC, UA 0-5 0 - 5 WBC/hpf   Bacteria, UA FEW (A) NONE SEEN   Squamous Epithelial / LPF 0-5 (A) NONE SEEN   Mucous PRESENT    Budding Yeast PRESENT    Hyaline Casts, UA PRESENT   Urine culture     Status: Abnormal (Preliminary result)   Collection Time: 08/11/17 10:59 AM  Result Value Ref Range   Specimen Description URINE, CATHETERIZED    Special Requests NONE     Culture (A)     >=100,000 COLONIES/mL UNIDENTIFIED ORGANISM Performed at [facility], [address] [place], [place] [address]    Report Status PENDING     ABGS No results for input(s): PHART, PO2ART, TCO2, HCO3 in the last 72 hours.  Invalid input(s): PCO2 CULTURES Recent Results (from the past 240 hour(s))  Blood Culture (routine x 2)     Status: None (Preliminary result)   Collection Time: 08/11/17 12:30 AM  Result Value Ref Range Status   Specimen Description LEFT ANTECUBITAL  Final   Special Requests   Final    BOTTLES DRAWN AEROBIC AND ANAEROBIC Blood Culture adequate volume   Culture  Setup Time   Final    GRAM POSITIVE COCCI Gram Stain Report Called to,Read Back By and Verified With: SMITH,J. AT 0045 ON 08/13/20181 BY EVA IN BOTH AEROBIC AND ANAEROBIC BOTTLES Performed at [facility]    Culture [facility]  Final   Report Status PENDING  Incomplete  Blood Culture (routine x 2)     Status: None (Preliminary result)   Collection Time: 08/11/17 12:32 AM  Result Value Ref Range Status   Specimen Description RIGHT ANTECUBITAL  Final   Special Requests   Final    BOTTLES DRAWN AEROBIC AND ANAEROBIC Blood Culture adequate volume   Culture  Setup Time  Final    GRAM POSITIVE COCCI Gram Stain Report Called to,Read Back By and Verified With: ALLEN,N. AT 1852 ON 08/11/2017 BY EVA IN BOTH AEROBIC AND ANAEROBIC BOTTLES Performed at [facility]    Culture   Final    GRAM POSITIVE COCCI CULTURE REINCUBATED FOR BETTER GROWTH Performed at [facility], [address] [place], [place] [address]    Report Status PENDING  Incomplete  Blood Culture ID Panel (Reflexed)     Status: Abnormal   Collection Time: 08/11/17 12:32 AM  Result Value Ref Range Status   Enterococcus species NOT DETECTED NOT DETECTED Final   Vancomycin resistance NOT DETECTED NOT DETECTED Final   Listeria monocytogenes NOT DETECTED NOT DETECTED Final   Staphylococcus  species DETECTED (A) NOT DETECTED Final    Comment: CRITICAL RESULT CALLED TO, READ BACK BY AND VERIFIED WITH: Pasty Arch RN, AT [phone] 08/12/17 BY D. VANHOOK    Staphylococcus aureus NOT DETECTED NOT DETECTED Final   Methicillin resistance DETECTED (A) NOT DETECTED Final    Comment: CRITICAL RESULT CALLED TO, READ BACK BY AND VERIFIED WITH: Pasty Arch RN, AT [phone] 08/12/17 BY D. VANHOOK    Streptococcus species NOT DETECTED NOT DETECTED Final   Streptococcus agalactiae NOT DETECTED NOT DETECTED Final   Streptococcus pneumoniae NOT DETECTED NOT DETECTED Final   Streptococcus pyogenes NOT DETECTED NOT DETECTED Final   Acinetobacter baumannii NOT DETECTED NOT DETECTED Final   Enterobacteriaceae species NOT DETECTED NOT DETECTED Final   Enterobacter cloacae complex NOT DETECTED NOT DETECTED Final   Escherichia coli NOT DETECTED NOT DETECTED Final   Klebsiella oxytoca NOT DETECTED NOT DETECTED Final   Klebsiella pneumoniae NOT DETECTED NOT DETECTED Final   Proteus species NOT DETECTED NOT DETECTED Final   Serratia marcescens NOT DETECTED NOT DETECTED Final   Carbapenem resistance NOT DETECTED NOT DETECTED Final   Haemophilus influenzae NOT DETECTED NOT DETECTED Final   Neisseria meningitidis NOT DETECTED NOT DETECTED Final   Pseudomonas aeruginosa NOT DETECTED NOT DETECTED Final   Candida albicans NOT DETECTED NOT DETECTED Final   Candida glabrata NOT DETECTED NOT DETECTED Final   Candida krusei NOT DETECTED NOT DETECTED Final   Candida parapsilosis NOT DETECTED NOT DETECTED Final   Candida tropicalis NOT DETECTED NOT DETECTED Final    Comment: Performed at [facility], [address], [place], [place] [address]  MRSA PCR Screening     Status: Abnormal   Collection Time: 08/11/17  6:44 AM  Result Value Ref Range Status   MRSA by PCR POSITIVE (A) NEGATIVE Final    Comment:        The GeneXpert MRSA Assay (FDA approved for NASAL specimens only), is one component of a comprehensive MRSA  colonization surveillance program. It is not intended to diagnose MRSA infection nor to guide or monitor treatment for MRSA infections. RESULT CALLED TO, READ BACK BY AND VERIFIED WITH: ALLEN,N. AT 1712 ON 08/11/2017 BY BAUGHAM,M.   Urine culture     Status: Abnormal (Preliminary result)   Collection Time: 08/11/17 10:59 AM  Result Value Ref Range Status   Specimen Description URINE, CATHETERIZED  Final   Special Requests NONE  Final   Culture (A)  Final    >=100,000 COLONIES/mL UNIDENTIFIED ORGANISM Performed at [facility], [address], [place], [place] [address]    Report Status PENDING  Incomplete   Studies/Results: Ct Abdomen Pelvis Wo Contrast  Addendum Date: 08/11/2017   ADDENDUM REPORT: 08/11/2017 19:37 ADDENDUM:  These results were called by telephone at the time of interpretation on 08/11/2017 at 7:37 pm to Dr. Kari Baars , who verbally acknowledged these results. Electronically Signed   By: Marlan Palau M.D.   On: 08/11/2017 19:37   Result Date: 08/11/2017 CLINICAL DATA:  Abdominal aortic aneurysm, follow-up EXAM: CT ABDOMEN AND PELVIS WITHOUT CONTRAST TECHNIQUE: Multidetector CT imaging of the abdomen and pelvis was performed following the standard protocol without IV contrast. COMPARISON:  CT abdomen pelvis 07/06/2017 FINDINGS: Lower chest: Small bilateral pleural effusions with bibasilar atelectasis. Cardiac enlargement Hepatobiliary: Normal liver. Gallbladder not visualized. No biliary dilatation. Pancreas: Negative Spleen: Negative Adrenals/Urinary Tract: Left kidney displaced laterally due to large abdominal aortic aneurysm with retroperitoneal hemorrhage. Mild left hydronephrosis. Negative right kidney. Urinary bladder empty with Foley catheter Stomach/Bowel: Stomach decompressed and normal. Negative for bowel obstruction. No bowel mass or edema. Vascular/Lymphatic: Extensive atherosclerotic calcification. Large infrarenal abdominal aortic aneurysm with a  large saccular component projecting laterally on the left. This has increased significantly in size in the interval. The saccular component of the aneurysm now measures 10.3 x 10.6 cm, compared with 8.5 x 9.1 cm previously. There is mixed density within the aneurysm compatible with thrombus. There is now retroperitoneal hemorrhage on the left due to aneurysm rupture. This extends up to the left hemidiaphragm and inferiorly to the pelvis. This is a moderately large hematoma. Reproductive: Hysterectomy.  No pelvic mass Other: No free fluid in the pelvis Musculoskeletal: Lumbar degenerative changes. No acute skeletal abnormality. IMPRESSION: Atherosclerotic abdominal aorta. Large saccular aneurysm projecting to the left has enlarged significantly in the interval now measuring 10.3 x 10.6 mm. New area of retroperitoneal hemorrhage compatible with aneurysm rupture. Left kidney displaced laterally with mild hydronephrosis due to the aneurysm. Electronically Signed: By: Marlan Palau M.D. On: 08/11/2017 18:59    Medications:  Prior to Admission:  Prescriptions Prior to Admission  Medication Sig Dispense Refill Last Dose  . acetaminophen (TYLENOL) 500 MG tablet Take 250-500 mg by mouth every 6 (six) hours as needed for mild pain, moderate pain, fever or headache.    unknown  . albuterol (PROVENTIL) (2.5 MG/3ML) 0.083% nebulizer solution Take 2.5 mg by nebulization every 6 (six) hours as needed for shortness of breath (uses it regularly).    unknown  . ALPRAZolam (XANAX) 1 MG tablet Take 1 mg by mouth 4 (four) times daily as needed for anxiety.    08/10/2017 at Unknown time  . aspirin EC 81 MG tablet Take 81 mg by mouth daily.   08/10/2017 at Unknown time  . budesonide-formoterol (SYMBICORT) 160-4.5 MCG/ACT inhaler Inhale 1 puff into the lungs 2 (two) times daily.    08/10/2017 at Unknown time  . citalopram (CELEXA) 20 MG tablet Take 20 mg by mouth at bedtime.    08/10/2017 at Unknown time  . diphenhydrAMINE  (BENADRYL) 25 MG tablet Take 25 mg by mouth every 6 (six) hours as needed for allergies.   unknown at Unknown time  . gabapentin (NEURONTIN) 100 MG capsule Take 100 mg by mouth 4 (four) times daily.    08/10/2017 at Unknown time  . isosorbide mononitrate (IMDUR) 30 MG 24 hr tablet Take 1 tablet by mouth daily.   08/10/2017 at Unknown time  . meclizine (ANTIVERT) 25 MG tablet Take 1 tablet by mouth daily as needed for dizziness.   unknown  . oxyCODONE (ROXICODONE) 15 MG immediate release tablet Take 1 tablet by mouth 4 (four) times daily as needed for pain.   08/10/2017  at Unknown time  . tiotropium (SPIRIVA) 18 MCG inhalation capsule Place 18 mcg into inhaler and inhale at bedtime.    08/10/2017 at Unknown time  . torsemide (DEMADEX) 20 MG tablet Take 1 tablet by mouth daily.   08/10/2017 at Unknown time   Scheduled:  Continuous: . morphine 1 mg/hr (08/13/17 0000)   MNO:[id number] **OR** acetaminophen, albuterol, midazolam, ondansetron **OR** ondansetron (ZOFRAN) IV  Assesment: She was admitted with sepsis and has positive blood cultures. She was hypotensive requiring pressor support and she is known to have a very large abdominal aortic aneurysm. CT showed that this has ruptured with retroperitoneal hemorrhage. I'm surprised that she survive the day yesterday. Principal Problem:   Sepsis (HCC) Active Problems:   Anxiety disorder   Abdominal aortic aneurysm (HCC)   Chronic respiratory failure with hypoxia (HCC)   COPD (chronic obstructive pulmonary disease) (HCC)   DNR (do not resuscitate)   CKD (chronic kidney disease) stage 3, GFR 30-59 ml/min   Chronic diastolic CHF (congestive heart failure) (HCC)   Chronic pain syndrome   Leaking abdominal aortic aneurysm (AAA) (HCC)   Cellulitis   Decubitus skin ulcer    Plan: Continue comfort care. Transfer from stepdown    LOS: 2 days   Nikoli Nasser L 08/13/2017, 8:58 AM

## 2017-08-14 LAB — URINE CULTURE

## 2017-08-14 LAB — CULTURE, BLOOD (ROUTINE X 2): Special Requests: ADEQUATE

## 2017-08-14 MED ORDER — CHLORHEXIDINE GLUCONATE CLOTH 2 % EX PADS: 6.0000 | MEDICATED_PAD | Freq: Every day | CUTANEOUS | Status: DC

## 2017-08-14 MED ORDER — MUPIROCIN 2 % EX OINT
1.0000 "application " | TOPICAL_OINTMENT | Freq: Two times a day (BID) | CUTANEOUS | Status: DC
  Administered 2017-08-15: 1 via NASAL

## 2017-08-14 NOTE — Progress Notes (Addendum)
Subjective: She has been transferred to the floor. Comfort care continues. No family at bedside.  Objective: Vital signs in last 24 hours: Temp:  [97.7 F (36.5 C)] 97.7 F (36.5 C) (08/14 1440) Pulse Rate:  [110-127] 110 (08/14 1440) Resp:  [4-15] 6 (08/15 0733) BP: (90)/(65) 90/65 (08/14 1440) SpO2:  [75 %-95 %] 95 % (08/14 1440) Weight change:  Last BM Date: 08/13/17  Intake/Output from previous day: No intake/output data recorded.  PHYSICAL EXAM General appearance: Unresponsive Resp: Agonal respirations Cardio: regular rate and rhythm, S1, S2 normal, no murmur, click, rub or gallop GI: soft, non-tender; bowel sounds normal; no masses,  no organomegaly Extremities: extremities normal, atraumatic, no cyanosis or edema Poor skin turgor  Lab Results:  No results found for this or any previous visit (from the past 48 hour(s)).  ABGS No results for input(s): PHART, PO2ART, TCO2, HCO3 in the last 72 hours.  Invalid input(s): PCO2 CULTURES Recent Results (from the past 240 hour(s))  Blood Culture (routine x 2)     Status: Abnormal   Collection Time: 08/11/17 12:30 AM  Result Value Ref Range Status   Specimen Description LEFT ANTECUBITAL  Final   Special Requests   Final    BOTTLES DRAWN AEROBIC AND ANAEROBIC Blood Culture adequate volume   Culture  Setup Time   Final    GRAM POSITIVE COCCI Gram Stain Report Called to,Read Back By and Verified With: SMITH,J. AT 0045 ON 08/13/20181 BY EVA IN BOTH AEROBIC AND ANAEROBIC BOTTLES Performed at [facility]    Culture (A)  Final    STAPHYLOCOCCUS SPECIES (COAGULASE NEGATIVE) THE SIGNIFICANCE OF ISOLATING THIS ORGANISM FROM A SINGLE SET OF BLOOD CULTURES WHEN MULTIPLE SETS ARE DRAWN IS UNCERTAIN. PLEASE NOTIFY THE MICROBIOLOGY DEPARTMENT WITHIN ONE WEEK IF SPECIATION AND SENSITIVITIES ARE REQUIRED. Performed at [facility], [address], [place], [place] [address]    Report Status 08/13/2017 FINAL  Final  Blood  Culture (routine x 2)     Status: Abnormal (Preliminary result)   Collection Time: 08/11/17 12:32 AM  Result Value Ref Range Status   Specimen Description RIGHT ANTECUBITAL  Final   Special Requests   Final    BOTTLES DRAWN AEROBIC AND ANAEROBIC Blood Culture adequate volume   Culture  Setup Time   Final    GRAM POSITIVE COCCI Gram Stain Report Called to,Read Back By and Verified With: ALLEN,N. AT 1852 ON 08/11/2017 BY EVA IN BOTH AEROBIC AND ANAEROBIC BOTTLES Performed at [facility]    Culture (A)  Final    STAPHYLOCOCCUS AUREUS SUSCEPTIBILITIES TO FOLLOW STAPHYLOCOCCUS SPECIES (COAGULASE NEGATIVE) THE SIGNIFICANCE OF ISOLATING THIS ORGANISM FROM A SINGLE SET OF BLOOD CULTURES WHEN MULTIPLE SETS ARE DRAWN IS UNCERTAIN. PLEASE NOTIFY THE MICROBIOLOGY DEPARTMENT WITHIN ONE WEEK IF SPECIATION AND SENSITIVITIES ARE REQUIRED. Performed at [facility], [address] [place], [place] [address]    Report Status PENDING  Incomplete  Blood Culture ID Panel (Reflexed)     Status: Abnormal   Collection Time: 08/11/17 12:32 AM  Result Value Ref Range Status   Enterococcus species NOT DETECTED NOT DETECTED Final   Vancomycin resistance NOT DETECTED NOT DETECTED Final   Listeria monocytogenes NOT DETECTED NOT DETECTED Final   Staphylococcus species DETECTED (A) NOT DETECTED Final    Comment: CRITICAL RESULT CALLED TO, READ BACK BY AND VERIFIED WITH: Pasty Arch RN, AT [phone] 08/12/17 BY D. VANHOOK    Staphylococcus aureus NOT DETECTED NOT DETECTED Final  Methicillin resistance DETECTED (A) NOT DETECTED Final    Comment: CRITICAL RESULT CALLED TO, READ BACK BY AND VERIFIED WITH: Pasty Arch RN, AT [phone] 08/12/17 BY D. VANHOOK    Streptococcus species NOT DETECTED NOT DETECTED Final   Streptococcus agalactiae NOT DETECTED NOT DETECTED Final   Streptococcus pneumoniae NOT DETECTED NOT DETECTED Final   Streptococcus pyogenes NOT DETECTED NOT DETECTED Final   Acinetobacter baumannii NOT  DETECTED NOT DETECTED Final   Enterobacteriaceae species NOT DETECTED NOT DETECTED Final   Enterobacter cloacae complex NOT DETECTED NOT DETECTED Final   Escherichia coli NOT DETECTED NOT DETECTED Final   Klebsiella oxytoca NOT DETECTED NOT DETECTED Final   Klebsiella pneumoniae NOT DETECTED NOT DETECTED Final   Proteus species NOT DETECTED NOT DETECTED Final   Serratia marcescens NOT DETECTED NOT DETECTED Final   Carbapenem resistance NOT DETECTED NOT DETECTED Final   Haemophilus influenzae NOT DETECTED NOT DETECTED Final   Neisseria meningitidis NOT DETECTED NOT DETECTED Final   Pseudomonas aeruginosa NOT DETECTED NOT DETECTED Final   Candida albicans NOT DETECTED NOT DETECTED Final   Candida glabrata NOT DETECTED NOT DETECTED Final   Candida krusei NOT DETECTED NOT DETECTED Final   Candida parapsilosis NOT DETECTED NOT DETECTED Final   Candida tropicalis NOT DETECTED NOT DETECTED Final    Comment: Performed at [facility], [address] [place], [place] [address]  MRSA PCR Screening     Status: Abnormal   Collection Time: 08/11/17  6:44 AM  Result Value Ref Range Status   MRSA by PCR POSITIVE (A) NEGATIVE Final    Comment:        The GeneXpert MRSA Assay (FDA approved for NASAL specimens only), is one component of a comprehensive MRSA colonization surveillance program. It is not intended to diagnose MRSA infection nor to guide or monitor treatment for MRSA infections. RESULT CALLED TO, READ BACK BY AND VERIFIED WITH: ALLEN,N. AT 1712 ON 08/11/2017 BY BAUGHAM,M.   Urine culture     Status: Abnormal   Collection Time: 08/11/17 10:59 AM  Result Value Ref Range Status   Specimen Description URINE, CATHETERIZED  Final   Special Requests NONE  Final   Culture >=100,000 COLONIES/mL ENTEROCOCCUS FAECALIS (A)  Final   Report Status 08/14/2017 FINAL  Final   Organism ID, Bacteria ENTEROCOCCUS FAECALIS (A)  Final      Susceptibility   Enterococcus faecalis - MIC*     AMPICILLIN <=2 SENSITIVE Sensitive     LEVOFLOXACIN >=8 RESISTANT Resistant     NITROFURANTOIN <=16 SENSITIVE Sensitive     VANCOMYCIN 1 SENSITIVE Sensitive     * >=100,000 COLONIES/mL ENTEROCOCCUS FAECALIS   Studies/Results: No results found.  Medications:  Prior to Admission:  Prescriptions Prior to Admission  Medication Sig Dispense Refill Last Dose  . acetaminophen (TYLENOL) 500 MG tablet Take 250-500 mg by mouth every 6 (six) hours as needed for mild pain, moderate pain, fever or headache.    unknown  . albuterol (PROVENTIL) (2.5 MG/3ML) 0.083% nebulizer solution Take 2.5 mg by nebulization every 6 (six) hours as needed for shortness of breath (uses it regularly).    unknown  . ALPRAZolam (XANAX) 1 MG tablet Take 1 mg by mouth 4 (four) times daily as needed for anxiety.    08/10/2017 at Unknown time  . aspirin EC 81 MG tablet Take 81 mg by mouth daily.   08/10/2017 at Unknown time  . budesonide-formoterol (SYMBICORT) 160-4.5 MCG/ACT inhaler Inhale 1 puff into the lungs 2 (  two) times daily.    08/10/2017 at Unknown time  . citalopram (CELEXA) 20 MG tablet Take 20 mg by mouth at bedtime.    08/10/2017 at Unknown time  . diphenhydrAMINE (BENADRYL) 25 MG tablet Take 25 mg by mouth every 6 (six) hours as needed for allergies.   unknown at Unknown time  . gabapentin (NEURONTIN) 100 MG capsule Take 100 mg by mouth 4 (four) times daily.    08/10/2017 at Unknown time  . isosorbide mononitrate (IMDUR) 30 MG 24 hr tablet Take 1 tablet by mouth daily.   08/10/2017 at Unknown time  . meclizine (ANTIVERT) 25 MG tablet Take 1 tablet by mouth daily as needed for dizziness.   unknown  . oxyCODONE (ROXICODONE) 15 MG immediate release tablet Take 1 tablet by mouth 4 (four) times daily as needed for pain.   08/10/2017 at Unknown time  . tiotropium (SPIRIVA) 18 MCG inhalation capsule Place 18 mcg into inhaler and inhale at bedtime.    08/10/2017 at Unknown time  . torsemide (DEMADEX) 20 MG tablet Take 1 tablet by  mouth daily.   08/10/2017 at Unknown time   Scheduled:  Continuous: . morphine 1 mg/hr (08/13/17 0000)   ZOX:[id number] **OR** acetaminophen, albuterol, midazolam, ondansetron **OR** ondansetron (ZOFRAN) IV  Assesment: She was admitted with sepsis and hypotension from that but also was found to have ruptured a long known abdominal aortic aneurysm. She is receiving comfort care. She appears to be actively dying. Principal Problem:   Sepsis (HCC) Active Problems:   Anxiety disorder   Abdominal aortic aneurysm (HCC)   Chronic respiratory failure with hypoxia (HCC)   COPD (chronic obstructive pulmonary disease) (HCC)   DNR (do not resuscitate)   CKD (chronic kidney disease) stage 3, GFR 30-59 ml/min   Chronic diastolic CHF (congestive heart failure) (HCC)   Chronic pain syndrome   Leaking abdominal aortic aneurysm (AAA) (HCC)   Cellulitis   Decubitus skin ulcer    Plan: Continue comfort care    LOS: 3 days   Couper Juncaj L 08/14/2017, 8:49 AM

## 2017-08-15 ENCOUNTER — Inpatient Hospital Stay (HOSPITAL_COMMUNITY)
Admission: EM | Admit: 2017-08-15 | Discharge: 2017-08-31 | DRG: 871 | Disposition: E | Source: Ambulatory Visit | Attending: Pulmonary Disease | Admitting: Pulmonary Disease

## 2017-08-15 DIAGNOSIS — Z91018 Allergy to other foods: Secondary | ICD-10-CM

## 2017-08-15 DIAGNOSIS — J449 Chronic obstructive pulmonary disease, unspecified: Secondary | ICD-10-CM | POA: Diagnosis present

## 2017-08-15 DIAGNOSIS — Z888 Allergy status to other drugs, medicaments and biological substances status: Secondary | ICD-10-CM | POA: Diagnosis not present

## 2017-08-15 DIAGNOSIS — I718 Aortic aneurysm of unspecified site, ruptured: Secondary | ICD-10-CM | POA: Diagnosis present

## 2017-08-15 DIAGNOSIS — M1990 Unspecified osteoarthritis, unspecified site: Secondary | ICD-10-CM | POA: Diagnosis present

## 2017-08-15 DIAGNOSIS — I252 Old myocardial infarction: Secondary | ICD-10-CM

## 2017-08-15 DIAGNOSIS — Z9981 Dependence on supplemental oxygen: Secondary | ICD-10-CM

## 2017-08-15 DIAGNOSIS — Z515 Encounter for palliative care: Secondary | ICD-10-CM | POA: Diagnosis present

## 2017-08-15 DIAGNOSIS — Z87891 Personal history of nicotine dependence: Secondary | ICD-10-CM

## 2017-08-15 DIAGNOSIS — I5032 Chronic diastolic (congestive) heart failure: Secondary | ICD-10-CM | POA: Diagnosis not present

## 2017-08-15 DIAGNOSIS — A419 Sepsis, unspecified organism: Secondary | ICD-10-CM | POA: Diagnosis not present

## 2017-08-15 DIAGNOSIS — Z882 Allergy status to sulfonamides status: Secondary | ICD-10-CM

## 2017-08-15 DIAGNOSIS — I509 Heart failure, unspecified: Secondary | ICD-10-CM | POA: Diagnosis present

## 2017-08-15 DIAGNOSIS — Z8673 Personal history of transient ischemic attack (TIA), and cerebral infarction without residual deficits: Secondary | ICD-10-CM | POA: Diagnosis not present

## 2017-08-15 DIAGNOSIS — I110 Hypertensive heart disease with heart failure: Secondary | ICD-10-CM | POA: Diagnosis present

## 2017-08-15 DIAGNOSIS — I713 Abdominal aortic aneurysm, ruptured: Secondary | ICD-10-CM | POA: Diagnosis not present

## 2017-08-15 MED ORDER — SODIUM CHLORIDE 0.9 % IV SOLN
1.0000 mg/h | INTRAVENOUS | Status: DC
  Filled 2017-08-15: qty 10

## 2017-08-15 MED ORDER — IPRATROPIUM-ALBUTEROL 0.5-2.5 (3) MG/3ML IN SOLN: 3.0000 mL | RESPIRATORY_TRACT | Status: DC | PRN

## 2017-08-15 MED ORDER — MIDAZOLAM HCL 2 MG/2ML IJ SOLN: 1.0000 mg | Freq: Four times a day (QID) | INTRAMUSCULAR | Status: DC | PRN

## 2017-08-15 NOTE — Plan of Care (Signed)
Problem: Physical Regulation: Goal: Ability to maintain clinical measurements within normal limits will improve Outcome: Not Applicable Date Met: 48/34/75 Pt being turned q2h, on comfort care with Morphine gtt. Will continue to monitor physical regulation.

## 2017-08-15 NOTE — Care Management (Signed)
Patient being admitted to [facility] under GIP status.

## 2017-08-15 NOTE — Progress Notes (Signed)
Subjective: No change. She is unresponsive with agonal respirations  Objective: Vital signs in last 24 hours: Temp:  [98 F (36.7 C)-99.5 F (37.5 C)] 98 F (36.7 C) (08/16 0425) Pulse Rate:  [110-120] 110 (08/16 0425) Resp:  [5-8] 7 (08/16 0425) BP: (70-82)/(52-58) 82/58 (08/16 0425) SpO2:  [80 %-90 %] 90 % (08/15 2045) Weight change:  Last BM Date: 08/13/17  Intake/Output from previous day: 08/15 0701 - 08/16 0700 In: 39 [I.V.:39] Out: -   PHYSICAL EXAM General appearance: Unresponsive, pale Resp: rhonchi bilaterally and Agonal respirations at about 6 Cardio: regular rate and rhythm, S1, S2 normal, no murmur, click, rub or gallop GI: Abdomen is firm Extremities: extremities normal, atraumatic, no cyanosis or edema Mucous membranes dry  Lab Results:  No results found for this or any previous visit (from the past 48 hour(s)).  ABGS No results for input(s): PHART, PO2ART, TCO2, HCO3 in the last 72 hours.  Invalid input(s): PCO2 CULTURES Recent Results (from the past 240 hour(s))  Blood Culture (routine x 2)     Status: Abnormal   Collection Time: 08/11/17 12:30 AM  Result Value Ref Range Status   Specimen Description LEFT ANTECUBITAL  Final   Special Requests   Final    BOTTLES DRAWN AEROBIC AND ANAEROBIC Blood Culture adequate volume   Culture  Setup Time   Final    GRAM POSITIVE COCCI Gram Stain Report Called to,Read Back By and Verified With: SMITH,J. AT 0045 ON 08/13/20181 BY EVA IN BOTH AEROBIC AND ANAEROBIC BOTTLES Performed at [facility]    Culture (A)  Final    STAPHYLOCOCCUS SPECIES (COAGULASE NEGATIVE) THE SIGNIFICANCE OF ISOLATING THIS ORGANISM FROM A SINGLE SET OF BLOOD CULTURES WHEN MULTIPLE SETS ARE DRAWN IS UNCERTAIN. PLEASE NOTIFY THE MICROBIOLOGY DEPARTMENT WITHIN ONE WEEK IF SPECIATION AND SENSITIVITIES ARE REQUIRED. Performed at [facility], [address], [place], [place] [address]    Report Status 08/13/2017 FINAL  Final   Blood Culture (routine x 2)     Status: Abnormal   Collection Time: 08/11/17 12:32 AM  Result Value Ref Range Status   Specimen Description RIGHT ANTECUBITAL  Final   Special Requests   Final    BOTTLES DRAWN AEROBIC AND ANAEROBIC Blood Culture adequate volume   Culture  Setup Time   Final    GRAM POSITIVE COCCI Gram Stain Report Called to,Read Back By and Verified With: ALLEN,N. AT 1852 ON 08/11/2017 BY EVA IN BOTH AEROBIC AND ANAEROBIC BOTTLES Performed at [facility]    Culture (A)  Final    METHICILLIN RESISTANT STAPHYLOCOCCUS AUREUS STAPHYLOCOCCUS SPECIES (COAGULASE NEGATIVE) THE SIGNIFICANCE OF ISOLATING THIS ORGANISM FROM A SINGLE SET OF BLOOD CULTURES WHEN MULTIPLE SETS ARE DRAWN IS UNCERTAIN. PLEASE NOTIFY THE MICROBIOLOGY DEPARTMENT WITHIN ONE WEEK IF SPECIATION AND SENSITIVITIES ARE REQUIRED. Performed at [facility], [address] [place], [place] [address]    Report Status 08/14/2017 FINAL  Final   Organism ID, Bacteria METHICILLIN RESISTANT STAPHYLOCOCCUS AUREUS  Final      Susceptibility   Methicillin resistant staphylococcus aureus - MIC*    CIPROFLOXACIN >=8 RESISTANT Resistant     ERYTHROMYCIN >=8 RESISTANT Resistant     GENTAMICIN <=0.5 SENSITIVE Sensitive     OXACILLIN >=4 RESISTANT Resistant     TETRACYCLINE <=1 SENSITIVE Sensitive     VANCOMYCIN 1 SENSITIVE Sensitive     TRIMETH/SULFA <=10 SENSITIVE Sensitive     CLINDAMYCIN >=8 RESISTANT Resistant     RIFAMPIN <=  0.5 SENSITIVE Sensitive     Inducible Clindamycin NEGATIVE Sensitive     * METHICILLIN RESISTANT STAPHYLOCOCCUS AUREUS  Blood Culture ID Panel (Reflexed)     Status: Abnormal   Collection Time: 08/11/17 12:32 AM  Result Value Ref Range Status   Enterococcus species NOT DETECTED NOT DETECTED Final   Vancomycin resistance NOT DETECTED NOT DETECTED Final   Listeria monocytogenes NOT DETECTED NOT DETECTED Final   Staphylococcus species DETECTED (A) NOT DETECTED Final    Comment:  CRITICAL RESULT CALLED TO, READ BACK BY AND VERIFIED WITH: Pasty Arch RN, AT [phone] 08/12/17 BY D. VANHOOK    Staphylococcus aureus NOT DETECTED NOT DETECTED Final   Methicillin resistance DETECTED (A) NOT DETECTED Final    Comment: CRITICAL RESULT CALLED TO, READ BACK BY AND VERIFIED WITH: Pasty Arch RN, AT [phone] 08/12/17 BY D. VANHOOK    Streptococcus species NOT DETECTED NOT DETECTED Final   Streptococcus agalactiae NOT DETECTED NOT DETECTED Final   Streptococcus pneumoniae NOT DETECTED NOT DETECTED Final   Streptococcus pyogenes NOT DETECTED NOT DETECTED Final   Acinetobacter baumannii NOT DETECTED NOT DETECTED Final   Enterobacteriaceae species NOT DETECTED NOT DETECTED Final   Enterobacter cloacae complex NOT DETECTED NOT DETECTED Final   Escherichia coli NOT DETECTED NOT DETECTED Final   Klebsiella oxytoca NOT DETECTED NOT DETECTED Final   Klebsiella pneumoniae NOT DETECTED NOT DETECTED Final   Proteus species NOT DETECTED NOT DETECTED Final   Serratia marcescens NOT DETECTED NOT DETECTED Final   Carbapenem resistance NOT DETECTED NOT DETECTED Final   Haemophilus influenzae NOT DETECTED NOT DETECTED Final   Neisseria meningitidis NOT DETECTED NOT DETECTED Final   Pseudomonas aeruginosa NOT DETECTED NOT DETECTED Final   Candida albicans NOT DETECTED NOT DETECTED Final   Candida glabrata NOT DETECTED NOT DETECTED Final   Candida krusei NOT DETECTED NOT DETECTED Final   Candida parapsilosis NOT DETECTED NOT DETECTED Final   Candida tropicalis NOT DETECTED NOT DETECTED Final    Comment: Performed at [facility], [address] [place], [place] [address]  MRSA PCR Screening     Status: Abnormal   Collection Time: 08/11/17  6:44 AM  Result Value Ref Range Status   MRSA by PCR POSITIVE (A) NEGATIVE Final    Comment:        The GeneXpert MRSA Assay (FDA approved for NASAL specimens only), is one component of a comprehensive MRSA colonization surveillance program. It is  not intended to diagnose MRSA infection nor to guide or monitor treatment for MRSA infections. RESULT CALLED TO, READ BACK BY AND VERIFIED WITH: ALLEN,N. AT 1712 ON 08/11/2017 BY BAUGHAM,M.   Urine culture     Status: Abnormal   Collection Time: 08/11/17 10:59 AM  Result Value Ref Range Status   Specimen Description URINE, CATHETERIZED  Final   Special Requests NONE  Final   Culture >=100,000 COLONIES/mL ENTEROCOCCUS FAECALIS (A)  Final   Report Status 08/14/2017 FINAL  Final   Organism ID, Bacteria ENTEROCOCCUS FAECALIS (A)  Final      Susceptibility   Enterococcus faecalis - MIC*    AMPICILLIN <=2 SENSITIVE Sensitive     LEVOFLOXACIN >=8 RESISTANT Resistant     NITROFURANTOIN <=16 SENSITIVE Sensitive     VANCOMYCIN 1 SENSITIVE Sensitive     * >=100,000 COLONIES/mL ENTEROCOCCUS FAECALIS   Studies/Results: No results found.  Medications:  Prior to Admission:  Prescriptions Prior to Admission  Medication Sig Dispense Refill Last Dose  . acetaminophen (TYLENOL) 500  MG tablet Take 250-500 mg by mouth every 6 (six) hours as needed for mild pain, moderate pain, fever or headache.    unknown  . albuterol (PROVENTIL) (2.5 MG/3ML) 0.083% nebulizer solution Take 2.5 mg by nebulization every 6 (six) hours as needed for shortness of breath (uses it regularly).    unknown  . ALPRAZolam (XANAX) 1 MG tablet Take 1 mg by mouth 4 (four) times daily as needed for anxiety.    08/10/2017 at Unknown time  . aspirin EC 81 MG tablet Take 81 mg by mouth daily.   08/10/2017 at Unknown time  . budesonide-formoterol (SYMBICORT) 160-4.5 MCG/ACT inhaler Inhale 1 puff into the lungs 2 (two) times daily.    08/10/2017 at Unknown time  . citalopram (CELEXA) 20 MG tablet Take 20 mg by mouth at bedtime.    08/10/2017 at Unknown time  . diphenhydrAMINE (BENADRYL) 25 MG tablet Take 25 mg by mouth every 6 (six) hours as needed for allergies.   unknown at Unknown time  . gabapentin (NEURONTIN) 100 MG capsule Take 100  mg by mouth 4 (four) times daily.    08/10/2017 at Unknown time  . isosorbide mononitrate (IMDUR) 30 MG 24 hr tablet Take 1 tablet by mouth daily.   08/10/2017 at Unknown time  . meclizine (ANTIVERT) 25 MG tablet Take 1 tablet by mouth daily as needed for dizziness.   unknown  . oxyCODONE (ROXICODONE) 15 MG immediate release tablet Take 1 tablet by mouth 4 (four) times daily as needed for pain.   08/10/2017 at Unknown time  . tiotropium (SPIRIVA) 18 MCG inhalation capsule Place 18 mcg into inhaler and inhale at bedtime.    08/10/2017 at Unknown time  . torsemide (DEMADEX) 20 MG tablet Take 1 tablet by mouth daily.   08/10/2017 at Unknown time   Scheduled: . Chlorhexidine Gluconate Cloth  6 each Topical Q0600  . mupirocin ointment  1 application Nasal BID   Continuous: . morphine 1 mg/hr (08/13/17 0000)   ZOX:[id number] **OR** acetaminophen, albuterol, midazolam, ondansetron **OR** ondansetron (ZOFRAN) IV  Assesment: She was admitted with sepsis and has positive blood cultures but this is not being treated and she has been transferred to comfort care. Her abdominal aortic aneurysm has ruptured and she has a large retroperitoneal hemorrhage. She's not a candidate for surgery. She is on morphine continuous infusion and is comfortable but has agonal respirations as she has had for the last 48 hours. Principal Problem:   Sepsis (HCC) Active Problems:   Anxiety disorder   Abdominal aortic aneurysm (HCC)   Chronic respiratory failure with hypoxia (HCC)   COPD (chronic obstructive pulmonary disease) (HCC)   DNR (do not resuscitate)   CKD (chronic kidney disease) stage 3, GFR 30-59 ml/min   Chronic diastolic CHF (congestive heart failure) (HCC)   Chronic pain syndrome   Leaking abdominal aortic aneurysm (AAA) (HCC)   Cellulitis   Decubitus skin ulcer    Plan: Continue comfort care    LOS: 4 days   Melanie Lowe L 08/29/2017, 8:54 AM

## 2017-08-15 NOTE — Care Management (Addendum)
PCP: Kari Baars  SS# [id number]  Patient Information    Patient Name Melanie Lowe, Melanie Lowe ([id number]) Sex Female DOB 05-08-1924  Room Bed  A308 A308-01  Patient Demographics   Address [address] [place] [address] Phone [phone] (Home) *Preferred* [phone] (Mobile) E-mail Address [email or web address]  Patient Ethnicity & Race   Ethnic Group Patient Race  Not Hispanic or Latino White or Caucasian  Emergency Contact(s)   Name Relation Home Work [place] Son [phone]  [phone]  Woods,[name] Relative [phone]  [phone]  Documents on File    Status Date Received Description  Documents for the Patient  EMR Medication Summary Not Received    EMR Immunization Summary Not Received    EMR Problem Summary Not Received    EMR Patient Summary Not Received    [place] HIPAA NOTICE OF PRIVACY - Scanned Received 02/05/11   [place] E-Signature HIPAA Notice of Privacy Received 02/05/11   [place] E-Signature HIPAA Notice of Privacy Spanish Not Received    Driver's License Not Received    Advance Directives/Living Will/HCPOA/POA Not Received    Historic Radiology Documentation Not Received    Historic Radiology Documentation Not Received    Insurance Card Received 07/13/11   Financial Application Not Received    Insurance Card Not Received    Release of Information Not Received    AMB Intake Forms/Questionnaires Not Received    HIM ROI Authorization Not Received    Release of Information Not Received    HIM ROI Authorization  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 reba safford  Release of Information  11/12/14   Other Photo ID Not Received    AMB Intake Forms/Questionnaires  12/01/15 12/16: [facility] REFERRAL FROM [facility] E-Signature HIPAA Notice of Privacy Signed 09/02/16 [facility] ED   AMB Intake Forms/Questionnaires  09/06/16 09/17 [facility] REFERRAL  [place] E-Signature HIPAA Notice of Privacy Signed 03/06/17   Documents for  the Encounter  AOB (Assignment of Insurance Benefits) Not Received    E-signature AOB Signed 08/11/17   MEDICARE RIGHTS Not Received    E-signature Medicare Rights Signed 08/11/17   Photos     ED Patient Billing Extract   ED PB Summary  Cardiac Monitoring Strip  08/11/17   Cardiac Monitoring Strip Shift Summary  08/11/17   EKG  08/12/17   Admission Information   Attending Provider Admitting Provider Admission Type Admission Date/Time  Kari Baars, MD Haydee Monica, MD Emergency 08/10/17 2354  Discharge Date Hospital Service Auth/Cert Status Service Area   Internal Medicine Incomplete [place] SERVICE AREA  Unit Room/Bed Admission Status   AP-DEPT 300 A308/A308-01 Admission (Confirmed)   Admission   Complaint  Fever  Hospital Account   Name Acct ID Class Status Primary Coverage  Melanie Lowe, Melanie Lowe [id number] Inpatient Open HU[employer]NA MEDICARE - HUMANA MEDICARE HMO      Guarantor Account (for Hospital Account [id number])   Name Relation to Pt Service Area Active? Acct Type  Melanie Lowe Self CHSA Yes Personal/Family  Address Phone    [address] [place], [place] [address] [phone](H)        Coverage Information (for Hospital Account [id number])   F/O Payor/Plan Precert #  [facility] MEDICARE/HUMANA MEDICARE HMO   Subscriber Subscriber #  Melanie Lowe, Melanie Lowe [id number]  Address Phone  [address] [address] [place], [place] [address]

## 2017-08-15 NOTE — Care Management (Addendum)
Discussed patient status with attending, Dr. Juanetta Gosling.Patient has been comfort care for a couple of days.  Will arrange for referral to [facility] hospice to hopefully arrange for GIP status as patient is too fragile to transfer to Hospice facility.

## 2017-08-15 NOTE — Progress Notes (Signed)
45 mL of IV morphine wasted, new bag was given. Witnessed by Dagoberto Ligas, RN. Pharmacy also made aware.

## 2017-08-15 NOTE — Discharge Summary (Signed)
  She was admitted with sepsis but was found to have ruptured aortic aneurysm. Not a surgical candidate comfort care initiated .[facility] with [facility] status. Prognosis hours to days. She will be on morphine drip  Discharge diagnoses:  Ruptured aortic aneurysm Sepsis Copd Chronic hypoxic respiratory failure Chronic pain

## 2017-08-17 DIAGNOSIS — I5032 Chronic diastolic (congestive) heart failure: Secondary | ICD-10-CM | POA: Diagnosis not present

## 2017-08-17 DIAGNOSIS — A419 Sepsis, unspecified organism: Secondary | ICD-10-CM | POA: Diagnosis not present

## 2017-08-17 DIAGNOSIS — I713 Abdominal aortic aneurysm, ruptured: Secondary | ICD-10-CM | POA: Diagnosis not present

## 2017-08-31 NOTE — Progress Notes (Signed)
Nutrition Brief Note  Chart reviewed. Patient presents with ruptured aortic aneurysm. Pt now transitioning to comfort care and utilizing hospice services. No further nutrition interventions warranted at this time.     Royann Shivers MS,RD,CSG,LDN Office: [phone] Pager: [phone]

## 2017-08-31 NOTE — Progress Notes (Signed)
She has survived the night. She still has agonal breathing at about 4-6. She is clearly actively dying but has survived longer than I anticipated. Continue comfort care.

## 2017-08-31 NOTE — Death Summary Note (Signed)
DEATH SUMMARY   Patient Details  Name: Melanie Lowe MRN: [id number] DOB: 16-Aug-1924  Admission/Discharge Information   Admit Date:  29-Aug-2017  Date of Death: Date of Death: 08-30-2017  Time of Death: Time of Death: 21-May-1529  Length of Stay: 1  Referring Physician: Kari Baars, MD   Reason(s) for Hospitalization  Sepsis/ruptured aortic aneurysm  Diagnoses  Preliminary cause of death:  ruptured aortic aneurysm Secondary Diagnoses (including complications and co-morbidities):  Active Problems:   * No active hospital problems. *  Abdominal aortic aneurysm is not felt to be a surgical candidate Sepsis COPD Coronary artery occlusive disease Brief Hospital Course (including significant findings, care, treatment, and services provided and events leading to death)  Melanie Lowe is a 83 y.o. year old female who Came to the emergency department because of weakness and altered mental status. She was found to be septic. She was started on IV antibiotics and started on pressors. She is known to have an advanced aortic aneurysm that was leaking on previous admission. She underwent CT scan and it was found that the aneurysm had grown substantially and had ruptured and she had a large retroperitoneal bleed. At that point she was switched to comfort care. Extensive discussion with family at bedside and by telephone. She was eventually discharged from the acute care hospital and placed in palliative care with hospice she was on a morphine drip and she died on 08/30/17    Pertinent Labs and Studies  Significant Diagnostic Studies Ct Abdomen Pelvis Wo Contrast  Addendum Date: 08/11/2017   ADDENDUM REPORT: 08/11/2017 19:37 ADDENDUM: These results were called by telephone at the time of interpretation on 08/11/2017 at 7:37 pm to Dr. Kari Baars , who verbally acknowledged these results. Electronically Signed   By: Marlan Palau M.D.   On: 08/11/2017 19:37   Result Date: 08/11/2017 CLINICAL DATA:   Abdominal aortic aneurysm, follow-up EXAM: CT ABDOMEN AND PELVIS WITHOUT CONTRAST TECHNIQUE: Multidetector CT imaging of the abdomen and pelvis was performed following the standard protocol without IV contrast. COMPARISON:  CT abdomen pelvis 07/06/2017 FINDINGS: Lower chest: Small bilateral pleural effusions with bibasilar atelectasis. Cardiac enlargement Hepatobiliary: Normal liver. Gallbladder not visualized. No biliary dilatation. Pancreas: Negative Spleen: Negative Adrenals/Urinary Tract: Left kidney displaced laterally due to large abdominal aortic aneurysm with retroperitoneal hemorrhage. Mild left hydronephrosis. Negative right kidney. Urinary bladder empty with Foley catheter Stomach/Bowel: Stomach decompressed and normal. Negative for bowel obstruction. No bowel mass or edema. Vascular/Lymphatic: Extensive atherosclerotic calcification. Large infrarenal abdominal aortic aneurysm with a large saccular component projecting laterally on the left. This has increased significantly in size in the interval. The saccular component of the aneurysm now measures 10.3 x 10.6 cm, compared with 8.5 x 9.1 cm previously. There is mixed density within the aneurysm compatible with thrombus. There is now retroperitoneal hemorrhage on the left due to aneurysm rupture. This extends up to the left hemidiaphragm and inferiorly to the pelvis. This is a moderately large hematoma. Reproductive: Hysterectomy.  No pelvic mass Other: No free fluid in the pelvis Musculoskeletal: Lumbar degenerative changes. No acute skeletal abnormality. IMPRESSION: Atherosclerotic abdominal aorta. Large saccular aneurysm projecting to the left has enlarged significantly in the interval now measuring 10.3 x 10.6 mm. New area of retroperitoneal hemorrhage compatible with aneurysm rupture. Left kidney displaced laterally with mild hydronephrosis due to the aneurysm. Electronically Signed: By: Marlan Palau M.D. On: 08/11/2017 18:59   Dg Chest Port 1  View  Result Date: 08/11/2017 CLINICAL DATA:  84  y/o F; wound in sacral area with possible infection. Fever. EXAM: PORTABLE CHEST 1 VIEW COMPARISON:  07/11/2017 chest radiograph FINDINGS: Stable cardiomegaly given projection and technique. Aortic atherosclerosis with calcification. Emphysema. Interstitial opacities. Stable of left hemidiaphragm with left basilar atelectasis. No pleural effusion or pneumothorax. No acute osseous abnormality is evident. IMPRESSION: Stable cardiomegaly, aortic atherosclerosis, emphysema, elevated left hemidiaphragm, and left basilar atelectasis. Interstitial opacities, probably interstitial edema. Electronically Signed   By: Mitzi Hansen M.D.   On: 08/11/2017 01:03    Microbiology Recent Results (from the past 240 hour(s))  Blood Culture (routine x 2)     Status: Abnormal   Collection Time: 08/11/17 12:30 AM  Result Value Ref Range Status   Specimen Description LEFT ANTECUBITAL  Final   Special Requests   Final    BOTTLES DRAWN AEROBIC AND ANAEROBIC Blood Culture adequate volume   Culture  Setup Time   Final    GRAM POSITIVE COCCI Gram Stain Report Called to,Read Back By and Verified With: SMITH,J. AT 0045 ON 08/13/20181 BY EVA IN BOTH AEROBIC AND ANAEROBIC BOTTLES Performed at [facility]    Culture (A)  Final    STAPHYLOCOCCUS SPECIES (COAGULASE NEGATIVE) THE SIGNIFICANCE OF ISOLATING THIS ORGANISM FROM A SINGLE SET OF BLOOD CULTURES WHEN MULTIPLE SETS ARE DRAWN IS UNCERTAIN. PLEASE NOTIFY THE MICROBIOLOGY DEPARTMENT WITHIN ONE WEEK IF SPECIATION AND SENSITIVITIES ARE REQUIRED. Performed at [facility], [address] [place], [place] [address]    Report Status 08/13/2017 FINAL  Final  Blood Culture (routine x 2)     Status: Abnormal   Collection Time: 08/11/17 12:32 AM  Result Value Ref Range Status   Specimen Description RIGHT ANTECUBITAL  Final   Special Requests   Final    BOTTLES DRAWN AEROBIC AND ANAEROBIC Blood Culture  adequate volume   Culture  Setup Time   Final    GRAM POSITIVE COCCI Gram Stain Report Called to,Read Back By and Verified With: ALLEN,N. AT 1852 ON 08/11/2017 BY EVA IN BOTH AEROBIC AND ANAEROBIC BOTTLES Performed at [facility]    Culture (A)  Final    METHICILLIN RESISTANT STAPHYLOCOCCUS AUREUS STAPHYLOCOCCUS SPECIES (COAGULASE NEGATIVE) THE SIGNIFICANCE OF ISOLATING THIS ORGANISM FROM A SINGLE SET OF BLOOD CULTURES WHEN MULTIPLE SETS ARE DRAWN IS UNCERTAIN. PLEASE NOTIFY THE MICROBIOLOGY DEPARTMENT WITHIN ONE WEEK IF SPECIATION AND SENSITIVITIES ARE REQUIRED. Performed at [facility], [address] [place], [place] [address]    Report Status 08/14/2017 FINAL  Final   Organism ID, Bacteria METHICILLIN RESISTANT STAPHYLOCOCCUS AUREUS  Final      Susceptibility   Methicillin resistant staphylococcus aureus - MIC*    CIPROFLOXACIN >=8 RESISTANT Resistant     ERYTHROMYCIN >=8 RESISTANT Resistant     GENTAMICIN <=0.5 SENSITIVE Sensitive     OXACILLIN >=4 RESISTANT Resistant     TETRACYCLINE <=1 SENSITIVE Sensitive     VANCOMYCIN 1 SENSITIVE Sensitive     TRIMETH/SULFA <=10 SENSITIVE Sensitive     CLINDAMYCIN >=8 RESISTANT Resistant     RIFAMPIN <=0.5 SENSITIVE Sensitive     Inducible Clindamycin NEGATIVE Sensitive     * METHICILLIN RESISTANT STAPHYLOCOCCUS AUREUS  Blood Culture ID Panel (Reflexed)     Status: Abnormal   Collection Time: 08/11/17 12:32 AM  Result Value Ref Range Status   Enterococcus species NOT DETECTED NOT DETECTED Final   Vancomycin resistance NOT DETECTED NOT DETECTED Final   Listeria monocytogenes NOT DETECTED NOT DETECTED Final   Staphylococcus species  DETECTED (A) NOT DETECTED Final    Comment: CRITICAL RESULT CALLED TO, READ BACK BY AND VERIFIED WITH: Pasty Arch RN, AT [phone] 08/12/17 BY D. VANHOOK    Staphylococcus aureus NOT DETECTED NOT DETECTED Final   Methicillin resistance DETECTED (A) NOT DETECTED Final    Comment: CRITICAL RESULT CALLED  TO, READ BACK BY AND VERIFIED WITH: Pasty Arch RN, AT [phone] 08/12/17 BY D. VANHOOK    Streptococcus species NOT DETECTED NOT DETECTED Final   Streptococcus agalactiae NOT DETECTED NOT DETECTED Final   Streptococcus pneumoniae NOT DETECTED NOT DETECTED Final   Streptococcus pyogenes NOT DETECTED NOT DETECTED Final   Acinetobacter baumannii NOT DETECTED NOT DETECTED Final   Enterobacteriaceae species NOT DETECTED NOT DETECTED Final   Enterobacter cloacae complex NOT DETECTED NOT DETECTED Final   Escherichia coli NOT DETECTED NOT DETECTED Final   Klebsiella oxytoca NOT DETECTED NOT DETECTED Final   Klebsiella pneumoniae NOT DETECTED NOT DETECTED Final   Proteus species NOT DETECTED NOT DETECTED Final   Serratia marcescens NOT DETECTED NOT DETECTED Final   Carbapenem resistance NOT DETECTED NOT DETECTED Final   Haemophilus influenzae NOT DETECTED NOT DETECTED Final   Neisseria meningitidis NOT DETECTED NOT DETECTED Final   Pseudomonas aeruginosa NOT DETECTED NOT DETECTED Final   Candida albicans NOT DETECTED NOT DETECTED Final   Candida glabrata NOT DETECTED NOT DETECTED Final   Candida krusei NOT DETECTED NOT DETECTED Final   Candida parapsilosis NOT DETECTED NOT DETECTED Final   Candida tropicalis NOT DETECTED NOT DETECTED Final    Comment: Performed at [facility], [address], [place], [place] [address]  MRSA PCR Screening     Status: Abnormal   Collection Time: 08/11/17  6:44 AM  Result Value Ref Range Status   MRSA by PCR POSITIVE (A) NEGATIVE Final    Comment:        The GeneXpert MRSA Assay (FDA approved for NASAL specimens only), is one component of a comprehensive MRSA colonization surveillance program. It is not intended to diagnose MRSA infection nor to guide or monitor treatment for MRSA infections. RESULT CALLED TO, READ BACK BY AND VERIFIED WITH: ALLEN,N. AT 1712 ON 08/11/2017 BY BAUGHAM,M.   Urine culture     Status: Abnormal   Collection Time: 08/11/17  10:59 AM  Result Value Ref Range Status   Specimen Description URINE, CATHETERIZED  Final   Special Requests NONE  Final   Culture >=100,000 COLONIES/mL ENTEROCOCCUS FAECALIS (A)  Final   Report Status 08/14/2017 FINAL  Final   Organism ID, Bacteria ENTEROCOCCUS FAECALIS (A)  Final      Susceptibility   Enterococcus faecalis - MIC*    AMPICILLIN <=2 SENSITIVE Sensitive     LEVOFLOXACIN >=8 RESISTANT Resistant     NITROFURANTOIN <=16 SENSITIVE Sensitive     VANCOMYCIN 1 SENSITIVE Sensitive     * >=100,000 COLONIES/mL ENTEROCOCCUS FAECALIS    Lab Basic Metabolic Panel: No results for input(s): NA, K, CL, CO2, GLUCOSE, BUN, CREATININE, CALCIUM, MG, PHOS in the last 168 hours. Liver Function Tests: No results for input(s): AST, ALT, ALKPHOS, BILITOT, PROT, ALBUMIN in the last 168 hours. No results for input(s): LIPASE, AMYLASE in the last 168 hours. No results for input(s): AMMONIA in the last 168 hours. CBC: No results for input(s): WBC, NEUTROABS, HGB, HCT, MCV, PLT in the last 168 hours. Cardiac Enzymes: No results for input(s): CKTOTAL, CKMB, CKMBINDEX, TROPONINI in the last 168 hours. Sepsis Labs: No results for input(s): PROCALCITON, WBC, LATICACIDVEN  in the last 168 hours.  Procedures/Operations  Except for x-rays none   Katlyne Nishida L 08/19/2017, 7:51 AM

## 2017-08-31 NOTE — Progress Notes (Addendum)
Patient pronounced deceased at 1530 by 2 nurses Melanie Lowe and Melanie Lowe). Dr. Juanetta Gosling paged to notify. Also spoke with [facility] RN and she is going to notify the family. Patient was on a morphine drip, it was stopped and wasted with Bradd Burner, RN

## 2017-08-31 NOTE — Progress Notes (Signed)
Pts eyelids winced and fluttered when turned. BP stable SBP 93.

## 2017-08-31 NOTE — H&P (Signed)
KELISE KUCH MRN: [id number] DOB/AGE: [birth year] 83 y.o. Primary Care Physician:Briggitte Boline, Ramon Dredge, MD Admit date: Aug 30, 2017 Chief Complaint: Comfort care HPI: This is a 83 year old who had been in the acute care hospital with sepsis and then was found to have a ruptured aortic aneurysm with a large retroperitoneal bleed. She was switched from more aggressive treatment with pressors to comfort care at that point. She was not an operative candidate. She has been discharged from acute care hospital and placed in hospice care in the hospital.  Past Medical History:  Diagnosis Date  . AAA (abdominal aortic aneurysm) (HCC)   . Anxiety   . Arthritis   . CHF (congestive heart failure) (HCC) 11/09/2015  . COPD (chronic obstructive pulmonary disease) (HCC)   . Depression   . Hypertension   . Leg swelling   . Myocardial infarction (HCC)   . Neuropathy   . Oxygen dependent   . Pneumonia   . Stroke [facility])    Past Surgical History:  Procedure Laterality Date  . APPENDECTOMY    . CHOLECYSTECTOMY          Family History  Problem Relation Age of Onset  . Diabetes Brother   . Cancer Brother        Throat cancer, Heart attack  . Heart disease Brother   . Heart attack Brother   . Cancer Father   . Cancer Sister     Social History:  reports that she quit smoking about 6 years ago. Her smoking use included Cigarettes. She quit after 50.00 years of use. She quit smokeless tobacco use about 6 years ago. She reports that she does not drink alcohol or use drugs.   Allergies:  Allergies  Allergen Reactions  . Ciprofloxacin Hcl Nausea And Vomiting  . Nsaids Other (See Comments)    Advised by MD not to take this class of medication.  Pt is able to take coated Aspirin.    . Other Other (See Comments)    Spices trigger acid reflux.   . Sulfa Antibiotics Nausea And Vomiting  . Sulfur Nausea And Vomiting  . Tomato Other (See Comments)    Reaction:  Acid reflux    No prescriptions prior to  admission.       WUJ:WJXBJ from the symptoms mentioned above,there are no other symptoms referable to all systems reviewed.  Physical Exam: Blood pressure (!) 30/21, pulse (!) 40, temperature 98.1 F (36.7 C), temperature source Axillary, resp. rate (!) 7, SpO2 (!) 83 %. She is poorly responsive. She has gasping respirations. She is hypotensive. She is pale. Mucous membranes are dry. Her chest shows rhonchi bilaterally. Her heart is regular. Her abdomen is firm. Extremities showed no edema. She has some distal cyanosis. Central nervous system exam shows that she is unresponsive.   No results for input(s): WBC, NEUTROABS, HGB, HCT, MCV, PLT in the last 72 hours. No results for input(s): NA, K, CL, CO2, GLUCOSE, BUN, CREATININE, CALCIUM, MG in the last 72 hours.  Invalid input(s): PHOlablast2(ast:2,ALT:2,alkphos:2,bilitot:2,prot:2,albumin:2)@    Recent Results (from the past 240 hour(s))  Blood Culture (routine x 2)     Status: Abnormal   Collection Time: 08/11/17 12:30 AM  Result Value Ref Range Status   Specimen Description LEFT ANTECUBITAL  Final   Special Requests   Final    BOTTLES DRAWN AEROBIC AND ANAEROBIC Blood Culture adequate volume   Culture  Setup Time   Final    GRAM POSITIVE COCCI Gram Stain Report Called to,Read Back By  and Verified With: SMITH,J. AT 0045 ON 08/13/20181 BY EVA IN BOTH AEROBIC AND ANAEROBIC BOTTLES Performed at [facility]    Culture (A)  Final    STAPHYLOCOCCUS SPECIES (COAGULASE NEGATIVE) THE SIGNIFICANCE OF ISOLATING THIS ORGANISM FROM A SINGLE SET OF BLOOD CULTURES WHEN MULTIPLE SETS ARE DRAWN IS UNCERTAIN. PLEASE NOTIFY THE MICROBIOLOGY DEPARTMENT WITHIN ONE WEEK IF SPECIATION AND SENSITIVITIES ARE REQUIRED. Performed at [facility], [address], [place], [place] [address]    Report Status 08/13/2017 FINAL  Final  Blood Culture (routine x 2)     Status: Abnormal   Collection Time: 08/11/17 12:32 AM  Result Value Ref Range  Status   Specimen Description RIGHT ANTECUBITAL  Final   Special Requests   Final    BOTTLES DRAWN AEROBIC AND ANAEROBIC Blood Culture adequate volume   Culture  Setup Time   Final    GRAM POSITIVE COCCI Gram Stain Report Called to,Read Back By and Verified With: ALLEN,N. AT 1852 ON 08/11/2017 BY EVA IN BOTH AEROBIC AND ANAEROBIC BOTTLES Performed at [facility]    Culture (A)  Final    METHICILLIN RESISTANT STAPHYLOCOCCUS AUREUS STAPHYLOCOCCUS SPECIES (COAGULASE NEGATIVE) THE SIGNIFICANCE OF ISOLATING THIS ORGANISM FROM A SINGLE SET OF BLOOD CULTURES WHEN MULTIPLE SETS ARE DRAWN IS UNCERTAIN. PLEASE NOTIFY THE MICROBIOLOGY DEPARTMENT WITHIN ONE WEEK IF SPECIATION AND SENSITIVITIES ARE REQUIRED. Performed at [facility], [address], [place], [place] [address]    Report Status 08/14/2017 FINAL  Final   Organism ID, Bacteria METHICILLIN RESISTANT STAPHYLOCOCCUS AUREUS  Final      Susceptibility   Methicillin resistant staphylococcus aureus - MIC*    CIPROFLOXACIN >=8 RESISTANT Resistant     ERYTHROMYCIN >=8 RESISTANT Resistant     GENTAMICIN <=0.5 SENSITIVE Sensitive     OXACILLIN >=4 RESISTANT Resistant     TETRACYCLINE <=1 SENSITIVE Sensitive     VANCOMYCIN 1 SENSITIVE Sensitive     TRIMETH/SULFA <=10 SENSITIVE Sensitive     CLINDAMYCIN >=8 RESISTANT Resistant     RIFAMPIN <=0.5 SENSITIVE Sensitive     Inducible Clindamycin NEGATIVE Sensitive     * METHICILLIN RESISTANT STAPHYLOCOCCUS AUREUS  Blood Culture ID Panel (Reflexed)     Status: Abnormal   Collection Time: 08/11/17 12:32 AM  Result Value Ref Range Status   Enterococcus species NOT DETECTED NOT DETECTED Final   Vancomycin resistance NOT DETECTED NOT DETECTED Final   Listeria monocytogenes NOT DETECTED NOT DETECTED Final   Staphylococcus species DETECTED (A) NOT DETECTED Final    Comment: CRITICAL RESULT CALLED TO, READ BACK BY AND VERIFIED WITH: Pasty Arch RN, AT [phone] 08/12/17 BY D. VANHOOK     Staphylococcus aureus NOT DETECTED NOT DETECTED Final   Methicillin resistance DETECTED (A) NOT DETECTED Final    Comment: CRITICAL RESULT CALLED TO, READ BACK BY AND VERIFIED WITH: Pasty Arch RN, AT [phone] 08/12/17 BY D. VANHOOK    Streptococcus species NOT DETECTED NOT DETECTED Final   Streptococcus agalactiae NOT DETECTED NOT DETECTED Final   Streptococcus pneumoniae NOT DETECTED NOT DETECTED Final   Streptococcus pyogenes NOT DETECTED NOT DETECTED Final   Acinetobacter baumannii NOT DETECTED NOT DETECTED Final   Enterobacteriaceae species NOT DETECTED NOT DETECTED Final   Enterobacter cloacae complex NOT DETECTED NOT DETECTED Final   Escherichia coli NOT DETECTED NOT DETECTED Final   Klebsiella oxytoca NOT DETECTED NOT DETECTED Final   Klebsiella pneumoniae NOT DETECTED NOT DETECTED Final   Proteus species NOT DETECTED NOT  DETECTED Final   Serratia marcescens NOT DETECTED NOT DETECTED Final   Carbapenem resistance NOT DETECTED NOT DETECTED Final   Haemophilus influenzae NOT DETECTED NOT DETECTED Final   Neisseria meningitidis NOT DETECTED NOT DETECTED Final   Pseudomonas aeruginosa NOT DETECTED NOT DETECTED Final   Candida albicans NOT DETECTED NOT DETECTED Final   Candida glabrata NOT DETECTED NOT DETECTED Final   Candida krusei NOT DETECTED NOT DETECTED Final   Candida parapsilosis NOT DETECTED NOT DETECTED Final   Candida tropicalis NOT DETECTED NOT DETECTED Final    Comment: Performed at [facility], [address], [place], [place] [address]  MRSA PCR Screening     Status: Abnormal   Collection Time: 08/11/17  6:44 AM  Result Value Ref Range Status   MRSA by PCR POSITIVE (A) NEGATIVE Final    Comment:        The GeneXpert MRSA Assay (FDA approved for NASAL specimens only), is one component of a comprehensive MRSA colonization surveillance program. It is not intended to diagnose MRSA infection nor to guide or monitor treatment for MRSA infections. RESULT CALLED TO,  READ BACK BY AND VERIFIED WITH: ALLEN,N. AT 1712 ON 08/11/2017 BY BAUGHAM,M.   Urine culture     Status: Abnormal   Collection Time: 08/11/17 10:59 AM  Result Value Ref Range Status   Specimen Description URINE, CATHETERIZED  Final   Special Requests NONE  Final   Culture >=100,000 COLONIES/mL ENTEROCOCCUS FAECALIS (A)  Final   Report Status 08/14/2017 FINAL  Final   Organism ID, Bacteria ENTEROCOCCUS FAECALIS (A)  Final      Susceptibility   Enterococcus faecalis - MIC*    AMPICILLIN <=2 SENSITIVE Sensitive     LEVOFLOXACIN >=8 RESISTANT Resistant     NITROFURANTOIN <=16 SENSITIVE Sensitive     VANCOMYCIN 1 SENSITIVE Sensitive     * >=100,000 COLONIES/mL ENTEROCOCCUS FAECALIS     Ct Abdomen Pelvis Wo Contrast  Addendum Date: 08/11/2017   ADDENDUM REPORT: 08/11/2017 19:37 ADDENDUM: These results were called by telephone at the time of interpretation on 08/11/2017 at 7:37 pm to Dr. Kari Baars , who verbally acknowledged these results. Electronically Signed   By: Marlan Palau M.D.   On: 08/11/2017 19:37   Result Date: 08/11/2017 CLINICAL DATA:  Abdominal aortic aneurysm, follow-up EXAM: CT ABDOMEN AND PELVIS WITHOUT CONTRAST TECHNIQUE: Multidetector CT imaging of the abdomen and pelvis was performed following the standard protocol without IV contrast. COMPARISON:  CT abdomen pelvis 07/06/2017 FINDINGS: Lower chest: Small bilateral pleural effusions with bibasilar atelectasis. Cardiac enlargement Hepatobiliary: Normal liver. Gallbladder not visualized. No biliary dilatation. Pancreas: Negative Spleen: Negative Adrenals/Urinary Tract: Left kidney displaced laterally due to large abdominal aortic aneurysm with retroperitoneal hemorrhage. Mild left hydronephrosis. Negative right kidney. Urinary bladder empty with Foley catheter Stomach/Bowel: Stomach decompressed and normal. Negative for bowel obstruction. No bowel mass or edema. Vascular/Lymphatic: Extensive atherosclerotic calcification.  Large infrarenal abdominal aortic aneurysm with a large saccular component projecting laterally on the left. This has increased significantly in size in the interval. The saccular component of the aneurysm now measures 10.3 x 10.6 cm, compared with 8.5 x 9.1 cm previously. There is mixed density within the aneurysm compatible with thrombus. There is now retroperitoneal hemorrhage on the left due to aneurysm rupture. This extends up to the left hemidiaphragm and inferiorly to the pelvis. This is a moderately large hematoma. Reproductive: Hysterectomy.  No pelvic mass Other: No free fluid in the pelvis Musculoskeletal: Lumbar degenerative changes. No  acute skeletal abnormality. IMPRESSION: Atherosclerotic abdominal aorta. Large saccular aneurysm projecting to the left has enlarged significantly in the interval now measuring 10.3 x 10.6 mm. New area of retroperitoneal hemorrhage compatible with aneurysm rupture. Left kidney displaced laterally with mild hydronephrosis due to the aneurysm. Electronically Signed: By: Marlan Palau M.D. On: 08/11/2017 18:59   Dg Chest Port 1 View  Result Date: 08/11/2017 CLINICAL DATA:  83 y/o F; wound in sacral area with possible infection. Fever. EXAM: PORTABLE CHEST 1 VIEW COMPARISON:  07/11/2017 chest radiograph FINDINGS: Stable cardiomegaly given projection and technique. Aortic atherosclerosis with calcification. Emphysema. Interstitial opacities. Stable of left hemidiaphragm with left basilar atelectasis. No pleural effusion or pneumothorax. No acute osseous abnormality is evident. IMPRESSION: Stable cardiomegaly, aortic atherosclerosis, emphysema, elevated left hemidiaphragm, and left basilar atelectasis. Interstitial opacities, probably interstitial edema. Electronically Signed   By: Mitzi Hansen M.D.   On: 08/11/2017 01:03   Impression: She has ruptured aortic aneurysm. She was admitted to the hospital with sepsis but that is no longer being treated. She is  on comfort care Active Problems:   * No active hospital problems. *     Plan: Keep her comfortable      Undra Trembath L   08/19/2017, 7:48 AM

## 2017-08-31 NOTE — Care Management Note (Signed)
Case Management Note  Patient Details  Name: Melanie Lowe MRN: [id number] Date of Birth: 1924-06-23   If discussed at Long Length of Stay Meetings, dates discussed:  08/10/2017  Additional Comments: Patient made GIP 08/24/2017  Boyde Grieco, Chrystine Oiler, RN Sep 11, 2017, 9:06 AM

## 2017-08-31 DEATH — deceased

## 2018-05-15 IMAGING — DX DG LUMBAR SPINE COMPLETE 4+V
5 series · 5 of 5 positions shown · non-contrast
Comparison: June 21, 2015

CLINICAL DATA: Lumbago

EXAM:
LUMBAR SPINE - COMPLETE 4+ VIEW

[l-spine ap]
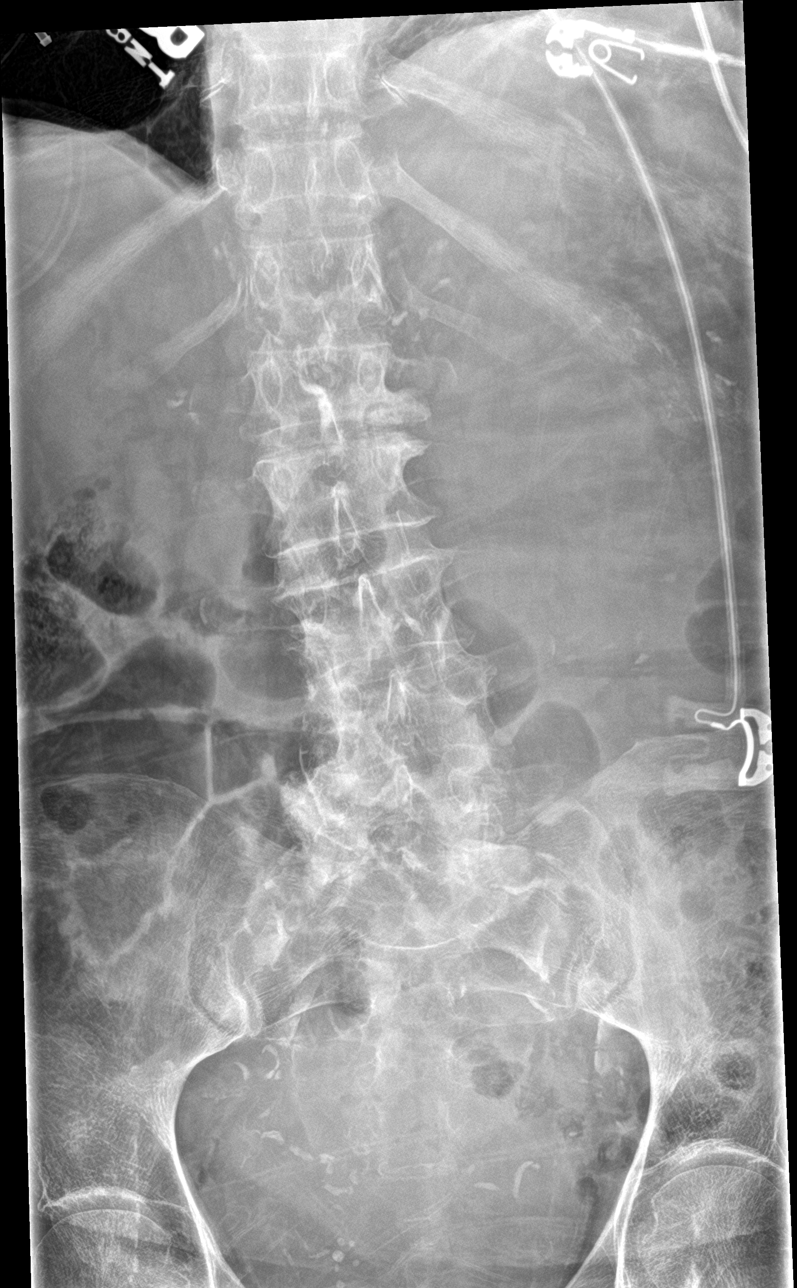

[l-spine obl (1 of 2)]
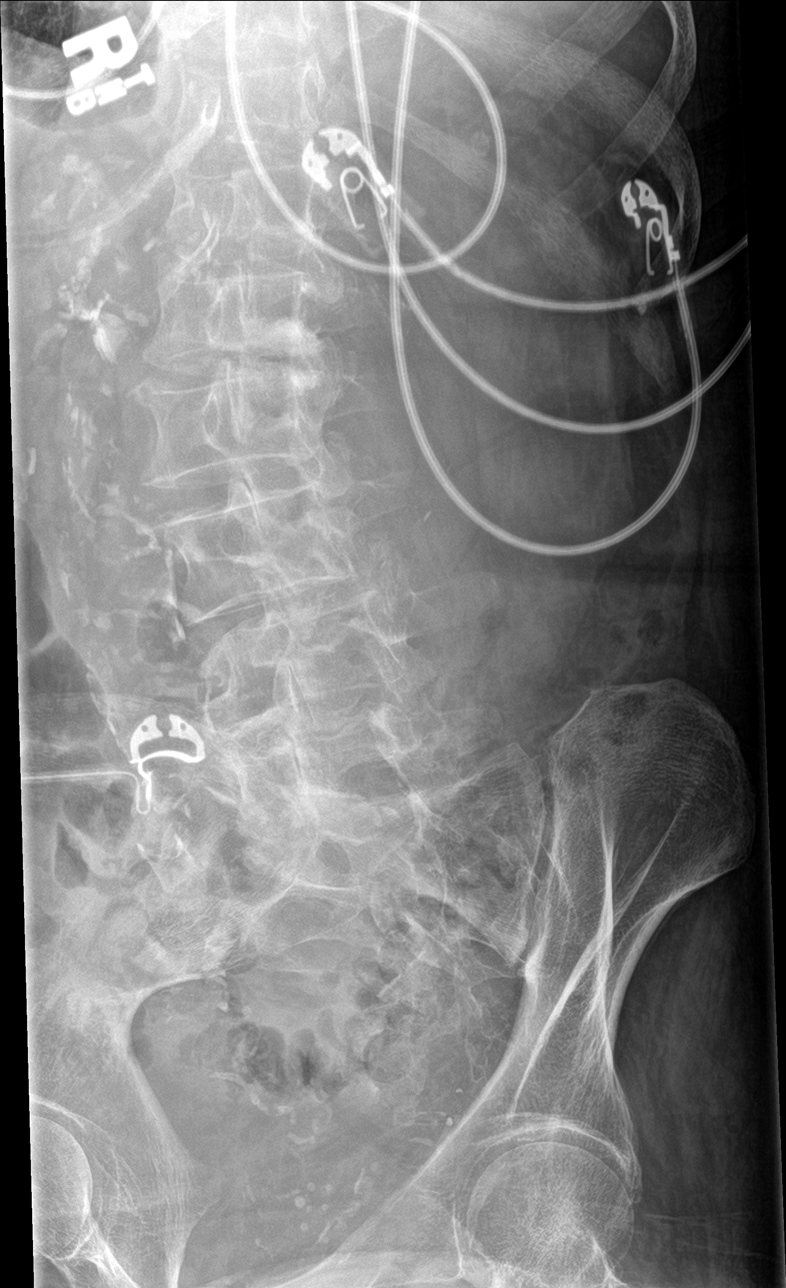

[l-spine obl (2 of 2)]
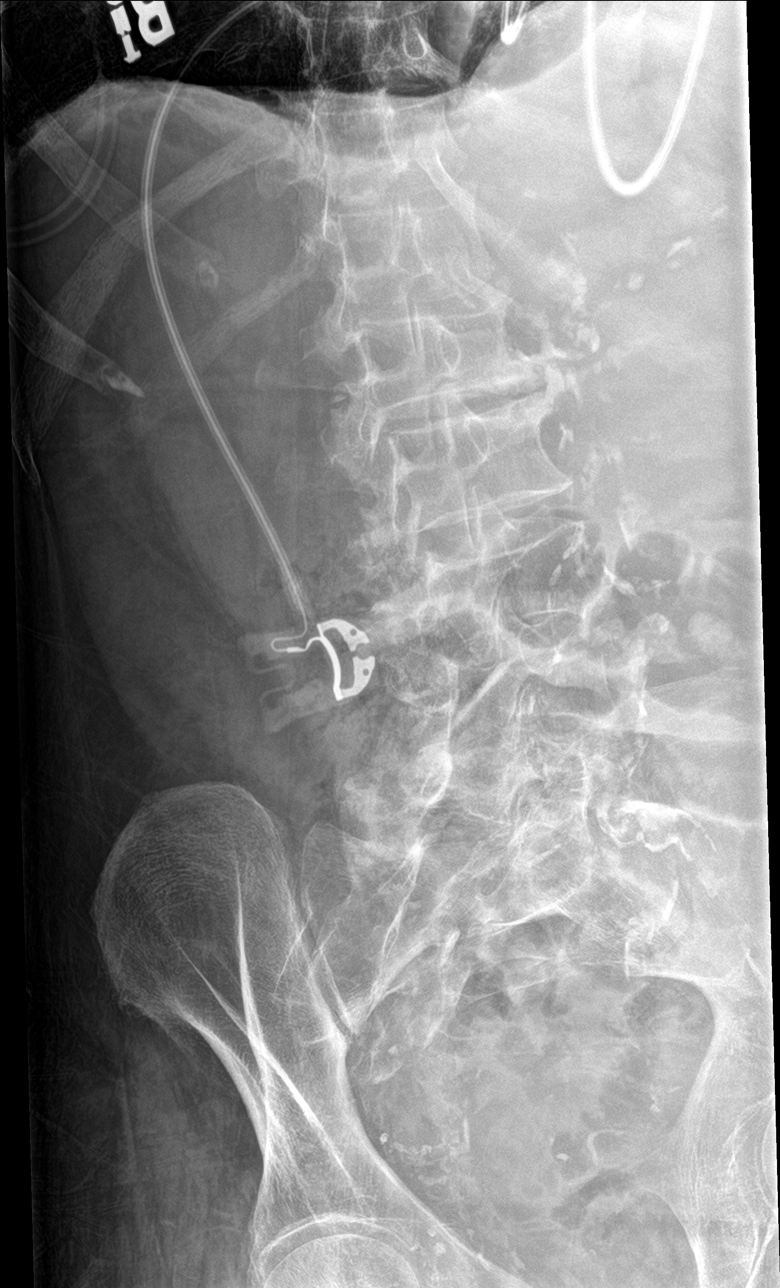

[l-spine lat]
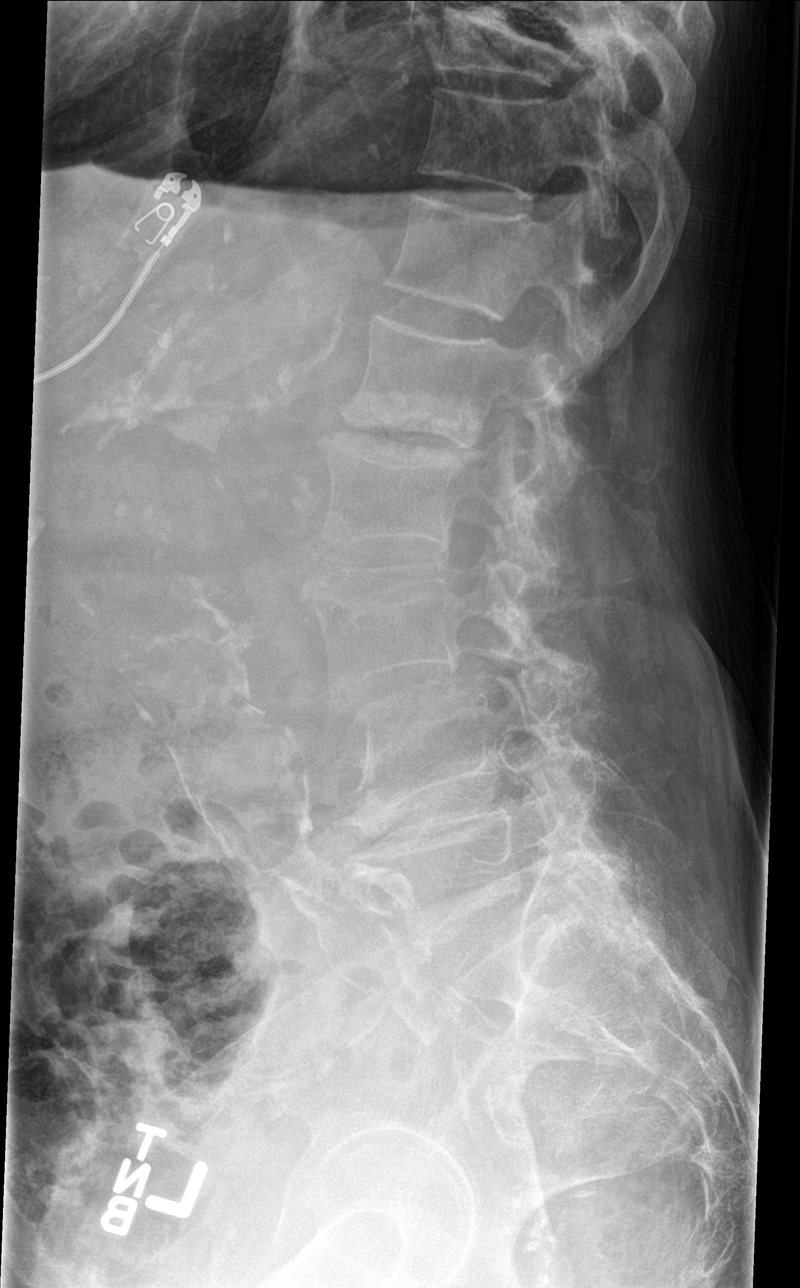

[l-spine spot]
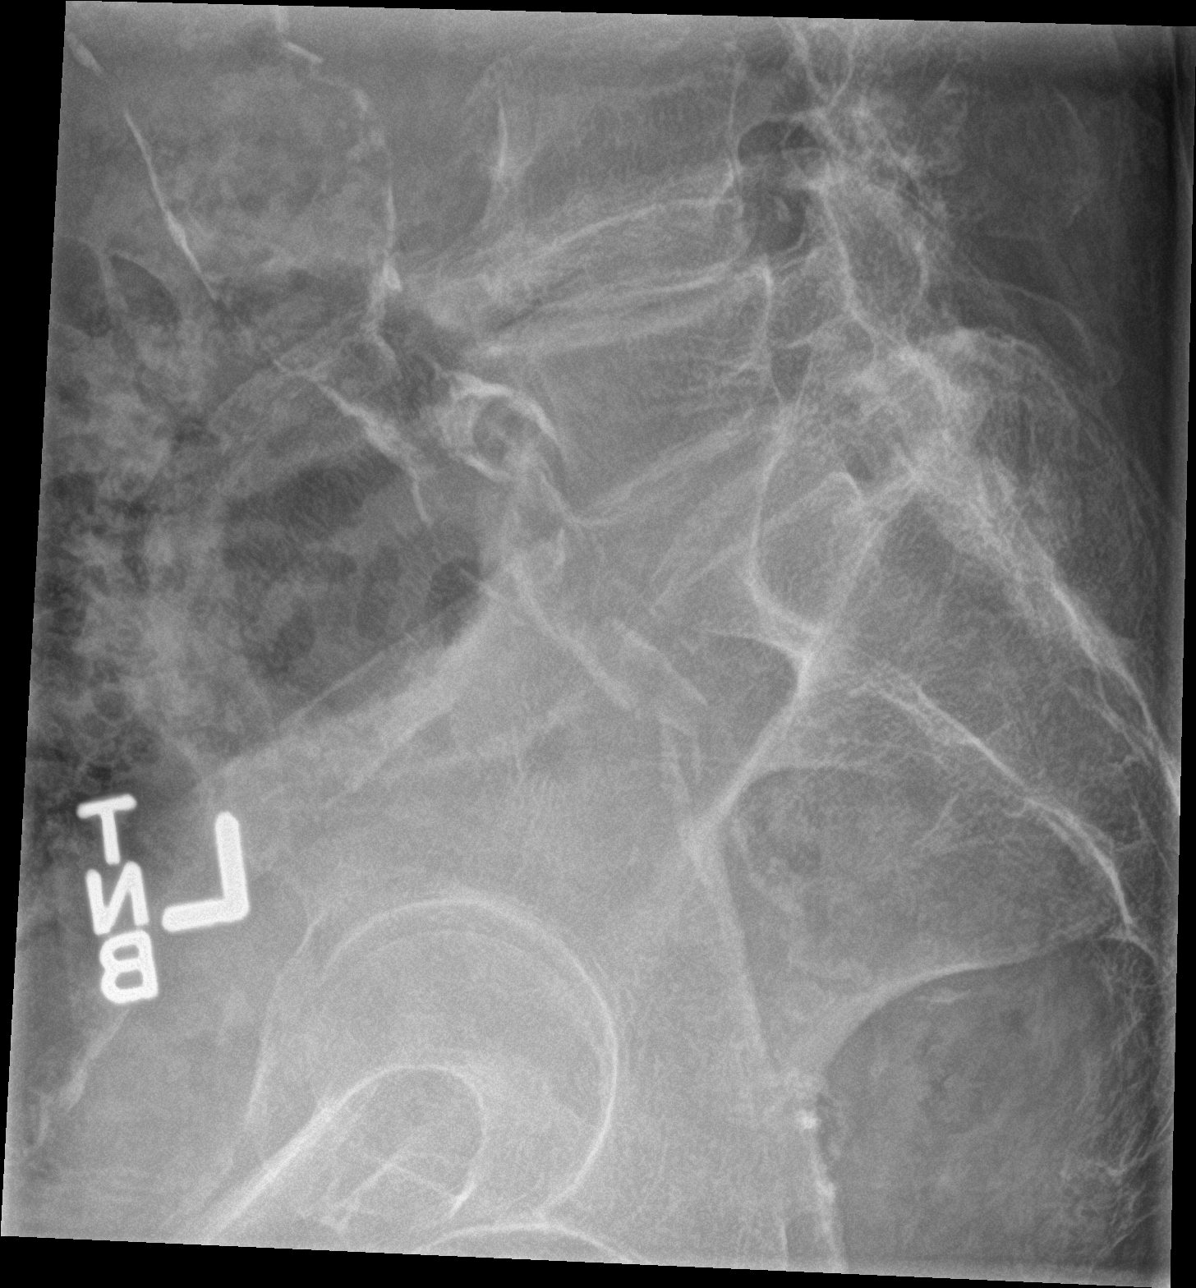

[5 of 5 positions shown; findings below may reference images not displayed]

FINDINGS: Frontal, lateral, spot lumbosacral lateral, and bilateral oblique
views were obtained. There are 5 non-rib-bearing lumbar type
vertebral bodies. There is no appreciable fracture. There is 5 mm of
anterolisthesis of L3 on L4. There is 2 minimal anterolisthesis of
L4 on L5. There is no new spondylolisthesis. There is disc space
narrowing at L1-2, L2-3, L3-4, and L4-5 with disc degeneration at
L1-2, increased from prior study. There is facet osteoarthritic
change at L3-4, L4-5, an L5-S1. There is extensive atherosclerotic
calcification in the aorta and iliac arteries with evidence of
abdominal aortic aneurysm, similar prior study.
IMPRESSION: Multilevel osteoarthritic change. Areas of mild spondylolisthesis at
L3-4 and L4-5, stable. No fracture. There is aortoiliac
atherosclerosis with abdominal aortic aneurysm, noted previously.

Aortic aneurysm NOS ([id number]).
